# Patient Record
Sex: Female | Born: 1945
Health system: Southern US, Community
[De-identification: ages and names within clinical notes are randomized; demographics above are authoritative.]

## PROBLEM LIST (undated history)

## (undated) DIAGNOSIS — E119 Type 2 diabetes mellitus without complications: Secondary | ICD-10-CM

## (undated) DIAGNOSIS — I119 Hypertensive heart disease without heart failure: Secondary | ICD-10-CM

## (undated) DIAGNOSIS — F329 Major depressive disorder, single episode, unspecified: Secondary | ICD-10-CM

## (undated) DIAGNOSIS — K5900 Constipation, unspecified: Secondary | ICD-10-CM

## (undated) DIAGNOSIS — K5790 Diverticulosis of intestine, part unspecified, without perforation or abscess without bleeding: Secondary | ICD-10-CM

## (undated) DIAGNOSIS — E7800 Pure hypercholesterolemia, unspecified: Secondary | ICD-10-CM

## (undated) DIAGNOSIS — K219 Gastro-esophageal reflux disease without esophagitis: Secondary | ICD-10-CM

## (undated) DIAGNOSIS — J45909 Unspecified asthma, uncomplicated: Secondary | ICD-10-CM

## (undated) DIAGNOSIS — F419 Anxiety disorder, unspecified: Secondary | ICD-10-CM

## (undated) DIAGNOSIS — F32A Depression, unspecified: Secondary | ICD-10-CM

## (undated) DIAGNOSIS — R600 Localized edema: Secondary | ICD-10-CM

## (undated) DIAGNOSIS — Z87442 Personal history of urinary calculi: Secondary | ICD-10-CM

## (undated) DIAGNOSIS — E669 Obesity, unspecified: Secondary | ICD-10-CM

## (undated) DIAGNOSIS — M1990 Unspecified osteoarthritis, unspecified site: Secondary | ICD-10-CM

## (undated) DIAGNOSIS — I2510 Atherosclerotic heart disease of native coronary artery without angina pectoris: Secondary | ICD-10-CM

## (undated) DIAGNOSIS — J189 Pneumonia, unspecified organism: Secondary | ICD-10-CM

## (undated) DIAGNOSIS — E739 Lactose intolerance, unspecified: Secondary | ICD-10-CM

## (undated) DIAGNOSIS — R002 Palpitations: Secondary | ICD-10-CM

## (undated) DIAGNOSIS — D126 Benign neoplasm of colon, unspecified: Secondary | ICD-10-CM

## (undated) DIAGNOSIS — M549 Dorsalgia, unspecified: Secondary | ICD-10-CM

## (undated) DIAGNOSIS — M2550 Pain in unspecified joint: Secondary | ICD-10-CM

## (undated) DIAGNOSIS — T7840XA Allergy, unspecified, initial encounter: Secondary | ICD-10-CM

## (undated) DIAGNOSIS — D649 Anemia, unspecified: Secondary | ICD-10-CM

## (undated) DIAGNOSIS — N189 Chronic kidney disease, unspecified: Secondary | ICD-10-CM

## (undated) DIAGNOSIS — I493 Ventricular premature depolarization: Secondary | ICD-10-CM

## (undated) DIAGNOSIS — R7303 Prediabetes: Secondary | ICD-10-CM

## (undated) DIAGNOSIS — I10 Essential (primary) hypertension: Secondary | ICD-10-CM

## (undated) DIAGNOSIS — M719 Bursopathy, unspecified: Secondary | ICD-10-CM

## (undated) DIAGNOSIS — I509 Heart failure, unspecified: Secondary | ICD-10-CM

## (undated) DIAGNOSIS — E785 Hyperlipidemia, unspecified: Secondary | ICD-10-CM

## (undated) DIAGNOSIS — Z5189 Encounter for other specified aftercare: Secondary | ICD-10-CM

## (undated) DIAGNOSIS — Z973 Presence of spectacles and contact lenses: Secondary | ICD-10-CM

## (undated) DIAGNOSIS — Z972 Presence of dental prosthetic device (complete) (partial): Secondary | ICD-10-CM

## (undated) DIAGNOSIS — M810 Age-related osteoporosis without current pathological fracture: Secondary | ICD-10-CM

## (undated) DIAGNOSIS — E782 Mixed hyperlipidemia: Secondary | ICD-10-CM

## (undated) DIAGNOSIS — G4730 Sleep apnea, unspecified: Secondary | ICD-10-CM

## (undated) DIAGNOSIS — H269 Unspecified cataract: Secondary | ICD-10-CM

## (undated) DIAGNOSIS — K08109 Complete loss of teeth, unspecified cause, unspecified class: Secondary | ICD-10-CM

## (undated) DIAGNOSIS — E1169 Type 2 diabetes mellitus with other specified complication: Secondary | ICD-10-CM

## (undated) DIAGNOSIS — R079 Chest pain, unspecified: Secondary | ICD-10-CM

## (undated) DIAGNOSIS — D179 Benign lipomatous neoplasm, unspecified: Secondary | ICD-10-CM

## (undated) DIAGNOSIS — R011 Cardiac murmur, unspecified: Secondary | ICD-10-CM

## (undated) HISTORY — DX: Pain in unspecified joint: M25.50

## (undated) HISTORY — DX: Age-related osteoporosis without current pathological fracture: M81.0

## (undated) HISTORY — DX: Sleep apnea, unspecified: G47.30

## (undated) HISTORY — DX: Localized edema: R60.0

## (undated) HISTORY — DX: Prediabetes: R73.03

## (undated) HISTORY — DX: Dorsalgia, unspecified: M54.9

## (undated) HISTORY — DX: Hypertensive heart disease without heart failure: I11.9

## (undated) HISTORY — PX: COLONOSCOPY: SHX174

## (undated) HISTORY — DX: Unspecified cataract: H26.9

## (undated) HISTORY — PX: TUMOR EXCISION: SHX421

## (undated) HISTORY — PX: DILATION AND CURETTAGE OF UTERUS: SHX78

## (undated) HISTORY — DX: Gastro-esophageal reflux disease without esophagitis: K21.9

## (undated) HISTORY — DX: Major depressive disorder, single episode, unspecified: F32.9

## (undated) HISTORY — DX: Allergy, unspecified, initial encounter: T78.40XA

## (undated) HISTORY — DX: Encounter for other specified aftercare: Z51.89

## (undated) HISTORY — PX: HEMORROIDECTOMY: SUR656

## (undated) HISTORY — DX: Chronic kidney disease, unspecified: N18.9

## (undated) HISTORY — PX: POLYPECTOMY: SHX149

## (undated) HISTORY — DX: Benign neoplasm of colon, unspecified: D12.6

## (undated) HISTORY — DX: Depression, unspecified: F32.A

## (undated) HISTORY — DX: Type 2 diabetes mellitus with other specified complication: E78.2

## (undated) HISTORY — DX: Essential (primary) hypertension: I10

## (undated) HISTORY — DX: Heart failure, unspecified: I50.9

## (undated) HISTORY — PX: BREAST EXCISIONAL BIOPSY: SUR124

## (undated) HISTORY — DX: Unspecified asthma, uncomplicated: J45.909

## (undated) HISTORY — DX: Unspecified osteoarthritis, unspecified site: M19.90

## (undated) HISTORY — DX: Cardiac murmur, unspecified: R01.1

## (undated) HISTORY — DX: Anemia, unspecified: D64.9

## (undated) HISTORY — DX: Type 2 diabetes mellitus with other specified complication: E11.69

## (undated) HISTORY — DX: Type 2 diabetes mellitus without complications: E11.9

## (undated) HISTORY — DX: Obesity, unspecified: E66.9

## (undated) HISTORY — DX: Lactose intolerance, unspecified: E73.9

## (undated) HISTORY — DX: Chest pain, unspecified: R07.9

## (undated) HISTORY — DX: Diverticulosis of intestine, part unspecified, without perforation or abscess without bleeding: K57.90

## (undated) HISTORY — DX: Bursopathy, unspecified: M71.9

---

## 1997-10-28 ENCOUNTER — Ambulatory Visit (HOSPITAL_COMMUNITY): Admission: RE | Admit: 1997-10-28 | Discharge: 1997-10-28 | Payer: Self-pay | Admitting: Urology

## 1997-11-05 ENCOUNTER — Inpatient Hospital Stay (HOSPITAL_COMMUNITY): Admission: AD | Admit: 1997-11-05 | Discharge: 1997-11-06 | Payer: Self-pay | Admitting: Urology

## 1999-02-05 ENCOUNTER — Observation Stay (HOSPITAL_COMMUNITY): Admission: EM | Admit: 1999-02-05 | Discharge: 1999-02-06 | Payer: Self-pay | Admitting: Emergency Medicine

## 1999-02-05 ENCOUNTER — Encounter: Payer: Self-pay | Admitting: Emergency Medicine

## 2000-05-27 ENCOUNTER — Emergency Department (HOSPITAL_COMMUNITY): Admission: EM | Admit: 2000-05-27 | Discharge: 2000-05-27 | Payer: Self-pay | Admitting: Emergency Medicine

## 2002-08-21 ENCOUNTER — Other Ambulatory Visit: Admission: RE | Admit: 2002-08-21 | Discharge: 2002-08-21 | Payer: Self-pay | Admitting: Obstetrics and Gynecology

## 2002-09-04 ENCOUNTER — Encounter: Admission: RE | Admit: 2002-09-04 | Discharge: 2002-09-04 | Payer: Self-pay | Admitting: *Deleted

## 2002-10-01 ENCOUNTER — Encounter: Admission: RE | Admit: 2002-10-01 | Discharge: 2002-12-30 | Payer: Self-pay | Admitting: Family Medicine

## 2003-07-11 ENCOUNTER — Encounter: Admission: RE | Admit: 2003-07-11 | Discharge: 2003-07-11 | Payer: Self-pay | Admitting: General Surgery

## 2003-07-15 ENCOUNTER — Encounter: Admission: RE | Admit: 2003-07-15 | Discharge: 2003-07-15 | Payer: Self-pay | Admitting: General Surgery

## 2003-07-24 ENCOUNTER — Encounter: Admission: RE | Admit: 2003-07-24 | Discharge: 2003-07-24 | Payer: Self-pay | Admitting: Family Medicine

## 2004-02-17 ENCOUNTER — Other Ambulatory Visit: Admission: RE | Admit: 2004-02-17 | Discharge: 2004-02-17 | Payer: Self-pay | Admitting: Obstetrics and Gynecology

## 2006-02-18 ENCOUNTER — Ambulatory Visit (HOSPITAL_COMMUNITY): Admission: RE | Admit: 2006-02-18 | Discharge: 2006-02-18 | Payer: Self-pay | Admitting: Family Medicine

## 2006-02-21 ENCOUNTER — Other Ambulatory Visit: Admission: RE | Admit: 2006-02-21 | Discharge: 2006-02-21 | Payer: Self-pay | Admitting: Obstetrics and Gynecology

## 2006-08-06 ENCOUNTER — Emergency Department (HOSPITAL_COMMUNITY): Admission: EM | Admit: 2006-08-06 | Discharge: 2006-08-06 | Payer: Self-pay | Admitting: Emergency Medicine

## 2006-12-01 ENCOUNTER — Inpatient Hospital Stay (HOSPITAL_COMMUNITY): Admission: EM | Admit: 2006-12-01 | Discharge: 2006-12-02 | Payer: Self-pay | Admitting: Emergency Medicine

## 2007-02-15 DIAGNOSIS — I2510 Atherosclerotic heart disease of native coronary artery without angina pectoris: Secondary | ICD-10-CM | POA: Insufficient documentation

## 2007-02-15 HISTORY — DX: Atherosclerotic heart disease of native coronary artery without angina pectoris: I25.10

## 2007-02-22 ENCOUNTER — Other Ambulatory Visit: Admission: RE | Admit: 2007-02-22 | Discharge: 2007-02-22 | Payer: Self-pay | Admitting: Obstetrics and Gynecology

## 2007-03-24 ENCOUNTER — Encounter: Admission: RE | Admit: 2007-03-24 | Discharge: 2007-03-24 | Payer: Self-pay | Admitting: Obstetrics and Gynecology

## 2007-06-20 ENCOUNTER — Emergency Department (HOSPITAL_COMMUNITY): Admission: EM | Admit: 2007-06-20 | Discharge: 2007-06-20 | Payer: Self-pay | Admitting: Emergency Medicine

## 2007-09-11 ENCOUNTER — Emergency Department (HOSPITAL_COMMUNITY): Admission: EM | Admit: 2007-09-11 | Discharge: 2007-09-11 | Payer: Self-pay | Admitting: Emergency Medicine

## 2007-09-28 ENCOUNTER — Inpatient Hospital Stay (HOSPITAL_COMMUNITY): Admission: EM | Admit: 2007-09-28 | Discharge: 2007-09-30 | Payer: Self-pay | Admitting: Emergency Medicine

## 2008-06-25 IMAGING — CR DG CHEST 2V
2 series · 2 of 2 positions shown · non-contrast
Comparison: 08/06/06.

CLINICAL DATA: Chest pain.
 CHEST - 2 VIEW:

[w chest pa]
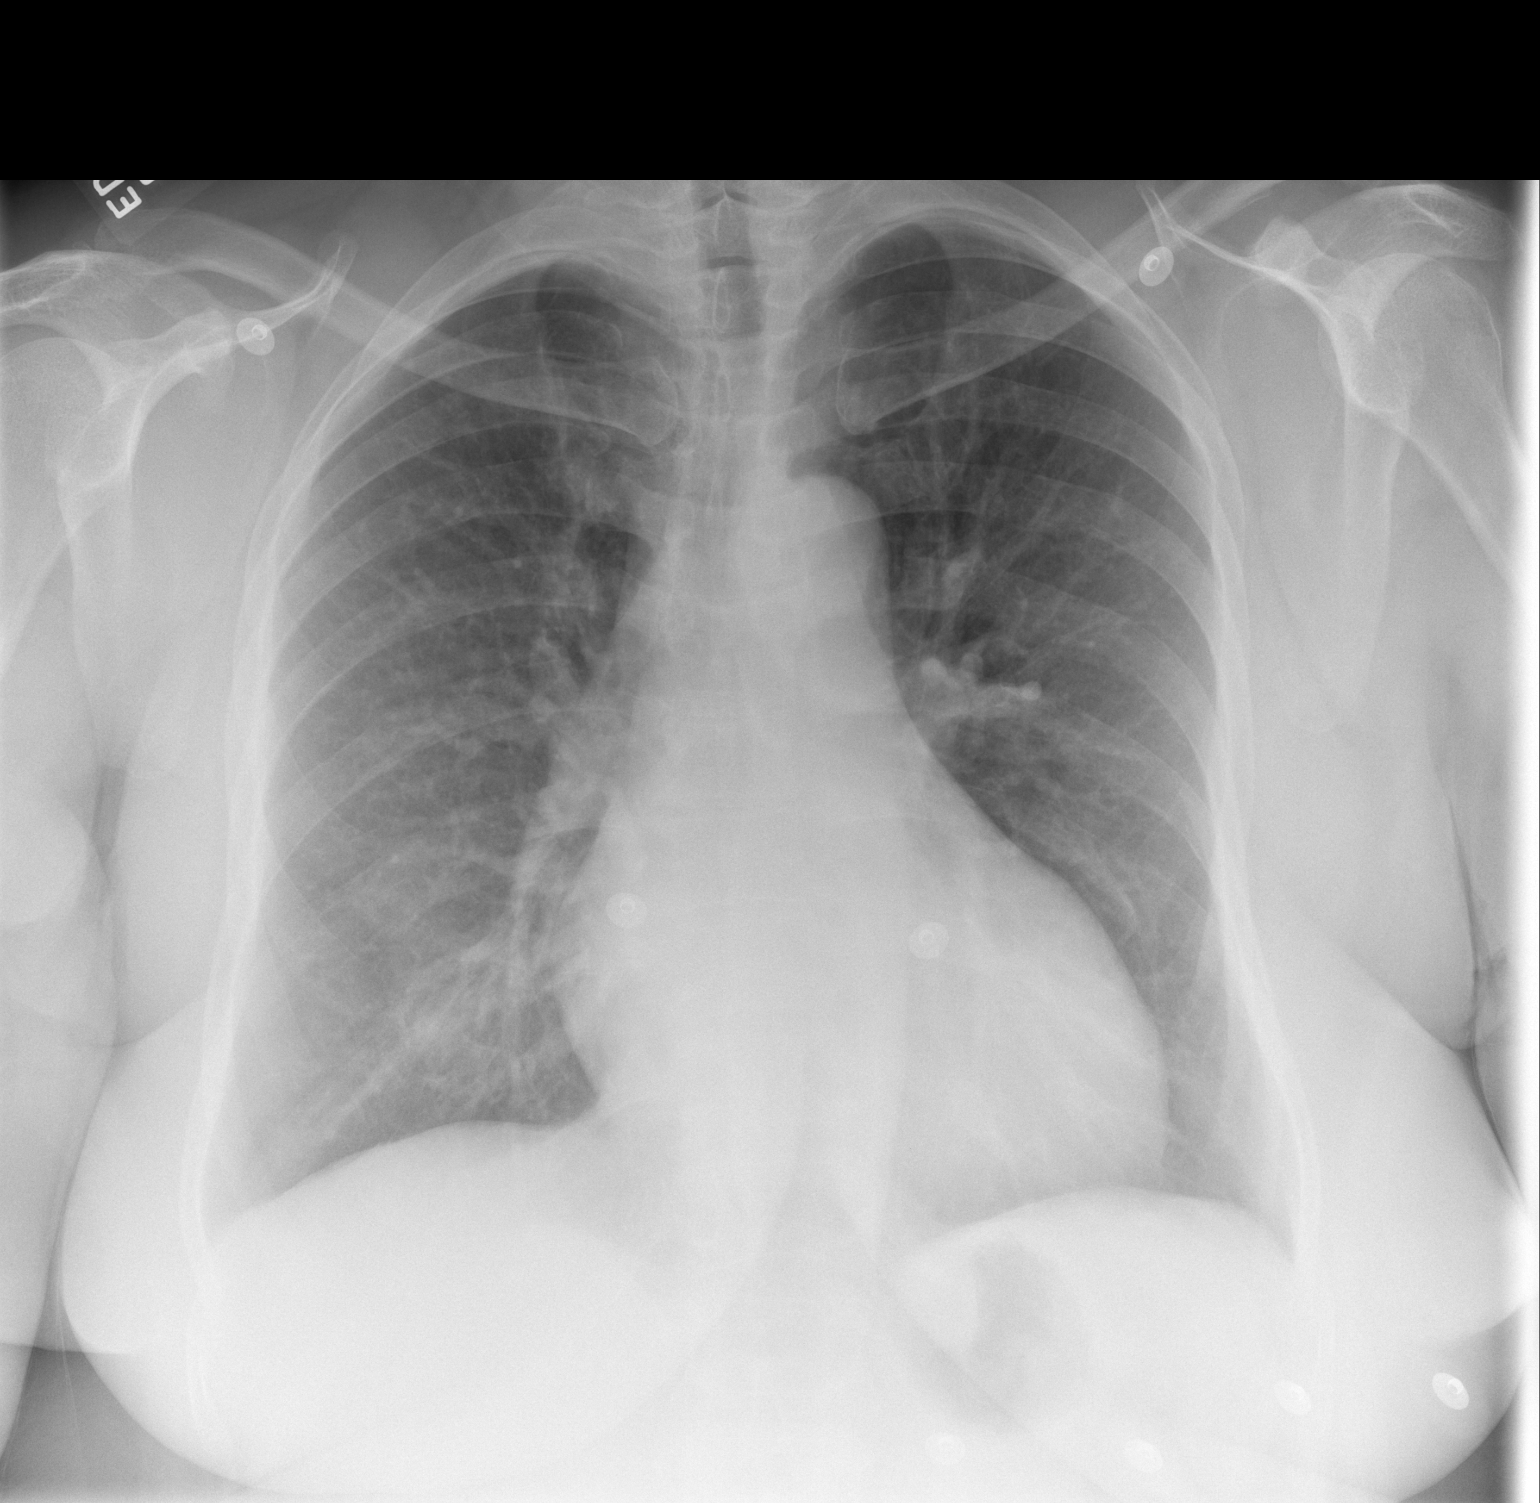

[w chest lat]
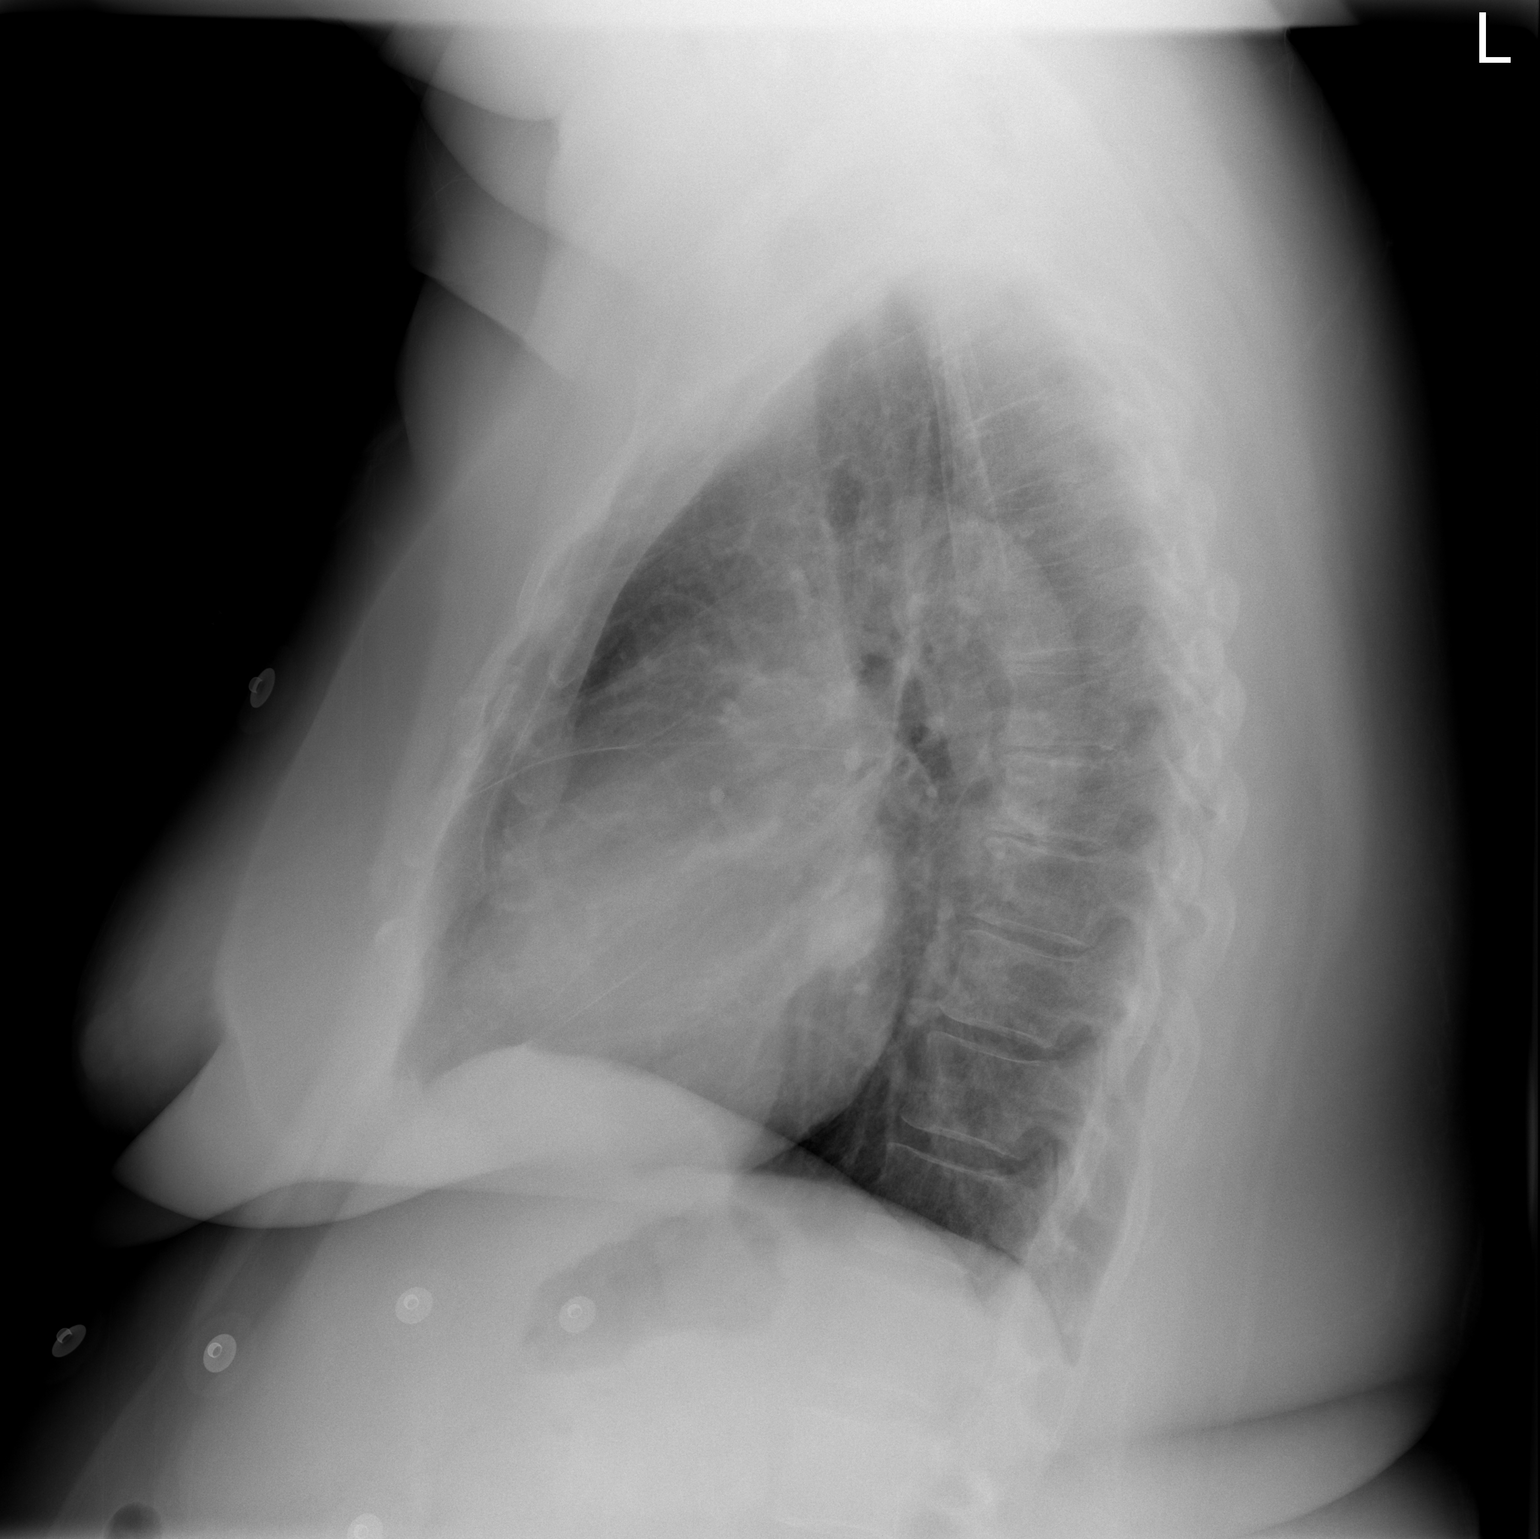

[2 of 2 positions shown; findings below may reference images not displayed]

FINDINGS: The heart is enlarged.  There is no congestive heart failure.  There are prominent lung markings and mild hyperaeration.  There is probable early COPD.  No acute process.
IMPRESSION: 1.  Cardiomegaly.
 2.  Chronic lung changes.
 3.  No active disease.

## 2008-11-13 ENCOUNTER — Emergency Department (HOSPITAL_COMMUNITY): Admission: EM | Admit: 2008-11-13 | Discharge: 2008-11-13 | Payer: Self-pay | Admitting: Emergency Medicine

## 2009-05-17 HISTORY — PX: CARDIAC CATHETERIZATION: SHX172

## 2009-08-07 ENCOUNTER — Ambulatory Visit: Payer: Self-pay | Admitting: Family Medicine

## 2009-09-18 ENCOUNTER — Ambulatory Visit: Payer: Self-pay | Admitting: Family Medicine

## 2009-11-14 ENCOUNTER — Ambulatory Visit: Payer: Self-pay | Admitting: Family Medicine

## 2010-01-21 ENCOUNTER — Ambulatory Visit: Payer: Self-pay | Admitting: Family Medicine

## 2010-02-13 ENCOUNTER — Ambulatory Visit: Payer: Self-pay | Admitting: Family Medicine

## 2010-02-19 ENCOUNTER — Encounter: Admission: RE | Admit: 2010-02-19 | Discharge: 2010-02-19 | Payer: Self-pay | Admitting: Family Medicine

## 2010-02-19 LAB — HM MAMMOGRAPHY: HM Mammogram: NEGATIVE

## 2010-04-13 ENCOUNTER — Emergency Department (HOSPITAL_COMMUNITY): Admission: EM | Admit: 2010-04-13 | Discharge: 2010-04-13 | Payer: Self-pay | Admitting: Emergency Medicine

## 2010-04-17 ENCOUNTER — Emergency Department (HOSPITAL_COMMUNITY)
Admission: EM | Admit: 2010-04-17 | Discharge: 2010-04-17 | Payer: Self-pay | Source: Home / Self Care | Admitting: Emergency Medicine

## 2010-05-12 ENCOUNTER — Ambulatory Visit (HOSPITAL_COMMUNITY)
Admission: RE | Admit: 2010-05-12 | Discharge: 2010-05-12 | Payer: Self-pay | Source: Home / Self Care | Attending: Obstetrics and Gynecology | Admitting: Obstetrics and Gynecology

## 2010-05-17 HISTORY — PX: REPLACEMENT TOTAL KNEE: SUR1224

## 2010-05-19 ENCOUNTER — Inpatient Hospital Stay (HOSPITAL_COMMUNITY)
Admission: RE | Admit: 2010-05-19 | Discharge: 2010-05-22 | Payer: Self-pay | Source: Home / Self Care | Attending: Orthopedic Surgery | Admitting: Orthopedic Surgery

## 2010-05-20 LAB — CBC
HCT: 32.4 % — ABNORMAL LOW (ref 36.0–46.0)
Hemoglobin: 10.3 g/dL — ABNORMAL LOW (ref 12.0–15.0)
MCH: 28.9 pg (ref 26.0–34.0)
MCHC: 31.8 g/dL (ref 30.0–36.0)
MCV: 91 fL (ref 78.0–100.0)
Platelets: 117 10*3/uL — ABNORMAL LOW (ref 150–400)
RBC: 3.56 MIL/uL — ABNORMAL LOW (ref 3.87–5.11)
RDW: 14.8 % (ref 11.5–15.5)
WBC: 9.9 10*3/uL (ref 4.0–10.5)

## 2010-05-20 LAB — GLUCOSE, CAPILLARY
Glucose-Capillary: 141 mg/dL — ABNORMAL HIGH (ref 70–99)
Glucose-Capillary: 151 mg/dL — ABNORMAL HIGH (ref 70–99)
Glucose-Capillary: 156 mg/dL — ABNORMAL HIGH (ref 70–99)
Glucose-Capillary: 165 mg/dL — ABNORMAL HIGH (ref 70–99)

## 2010-05-20 LAB — BASIC METABOLIC PANEL
BUN: 12 mg/dL (ref 6–23)
CO2: 25 mEq/L (ref 19–32)
Calcium: 8.5 mg/dL (ref 8.4–10.5)
Chloride: 103 mEq/L (ref 96–112)
Creatinine, Ser: 0.88 mg/dL (ref 0.4–1.2)
GFR calc Af Amer: >60 mL/min (ref >60)
GFR calc non Af Amer: >60 mL/min (ref >60)
Glucose, Bld: 153 mg/dL — ABNORMAL HIGH (ref 70–99)
Potassium: 4.2 mEq/L (ref 3.5–5.1)
Sodium: 136 mEq/L (ref 135–145)

## 2010-05-21 LAB — CBC
HCT: 29.6 % — ABNORMAL LOW (ref 36.0–46.0)
Hemoglobin: 9.5 g/dL — ABNORMAL LOW (ref 12.0–15.0)
MCH: 28.8 pg (ref 26.0–34.0)
MCHC: 32.1 g/dL (ref 30.0–36.0)
MCV: 89.7 fL (ref 78.0–100.0)
Platelets: 219 10*3/uL (ref 150–400)
RBC: 3.3 MIL/uL — ABNORMAL LOW (ref 3.87–5.11)
RDW: 14.8 % (ref 11.5–15.5)
WBC: 8.7 10*3/uL (ref 4.0–10.5)

## 2010-05-21 LAB — BASIC METABOLIC PANEL
BUN: 13 mg/dL (ref 6–23)
CO2: 25 mEq/L (ref 19–32)
Calcium: 8.5 mg/dL (ref 8.4–10.5)
Chloride: 104 mEq/L (ref 96–112)
Creatinine, Ser: 0.76 mg/dL (ref 0.4–1.2)
GFR calc Af Amer: >60 mL/min (ref >60)
GFR calc non Af Amer: >60 mL/min (ref >60)
Glucose, Bld: 144 mg/dL — ABNORMAL HIGH (ref 70–99)
Potassium: 4.3 mEq/L (ref 3.5–5.1)
Sodium: 134 mEq/L — ABNORMAL LOW (ref 135–145)

## 2010-05-21 LAB — GLUCOSE, CAPILLARY
Glucose-Capillary: 132 mg/dL — ABNORMAL HIGH (ref 70–99)
Glucose-Capillary: 124 mg/dL — ABNORMAL HIGH (ref 70–99)
Glucose-Capillary: 151 mg/dL — ABNORMAL HIGH (ref 70–99)
Glucose-Capillary: 146 mg/dL — ABNORMAL HIGH (ref 70–99)

## 2010-05-22 LAB — GLUCOSE, CAPILLARY
Glucose-Capillary: 127 mg/dL — ABNORMAL HIGH (ref 70–99)
Glucose-Capillary: 174 mg/dL — ABNORMAL HIGH (ref 70–99)

## 2010-07-27 LAB — BASIC METABOLIC PANEL
BUN: 11 mg/dL (ref 6–23)
GFR calc Af Amer: >60 mL/min (ref >60)
GFR calc non Af Amer: >60 mL/min (ref >60)

## 2010-07-27 LAB — URINALYSIS, ROUTINE W REFLEX MICROSCOPIC
Bilirubin Urine: NEGATIVE
Ketones, ur: NEGATIVE mg/dL
Protein, ur: NEGATIVE mg/dL
Urobilinogen, UA: 1 mg/dL (ref 0.0–1.0)

## 2010-07-27 LAB — CBC
MCH: 29 pg (ref 26.0–34.0)
MCHC: 32.8 g/dL (ref 30.0–36.0)
Platelets: 248 10*3/uL (ref 150–400)

## 2010-07-27 LAB — ABO/RH: ABO/RH(D): A POS

## 2010-07-27 LAB — TYPE AND SCREEN
ABO/RH(D): A POS
Antibody Screen: NEGATIVE

## 2010-07-27 LAB — GLUCOSE, CAPILLARY
Glucose-Capillary: 137 mg/dL — ABNORMAL HIGH (ref 70–99)
Glucose-Capillary: 180 mg/dL — ABNORMAL HIGH (ref 70–99)
Glucose-Capillary: 96 mg/dL (ref 70–99)

## 2010-07-27 LAB — SURGICAL PCR SCREEN: Staphylococcus aureus: NEGATIVE

## 2010-07-27 LAB — PROTIME-INR
INR: 1.16 (ref 0.00–1.49)
Prothrombin Time: 15 seconds (ref 11.6–15.2)

## 2010-07-28 LAB — DIFFERENTIAL
Basophils Absolute: 0 10*3/uL (ref 0.0–0.1)
Basophils Absolute: 0 10*3/uL (ref 0.0–0.1)
Basophils Relative: 0 % (ref 0–1)
Eosinophils Absolute: 0.1 10*3/uL (ref 0.0–0.7)
Eosinophils Relative: 1 % (ref 0–5)
Lymphocytes Relative: 25 % (ref 12–46)
Lymphocytes Relative: 26 % (ref 12–46)
Lymphs Abs: 2.2 10*3/uL (ref 0.7–4.0)
Lymphs Abs: 2.4 10*3/uL (ref 0.7–4.0)
Monocytes Absolute: 0.7 10*3/uL (ref 0.1–1.0)
Monocytes Absolute: 0.9 10*3/uL (ref 0.1–1.0)
Monocytes Relative: 9 % (ref 3–12)
Neutro Abs: 5.3 10*3/uL (ref 1.7–7.7)
Neutro Abs: 6 10*3/uL (ref 1.7–7.7)
Neutrophils Relative %: 64 % (ref 43–77)

## 2010-07-28 LAB — BASIC METABOLIC PANEL
BUN: 12 mg/dL (ref 6–23)
BUN: 17 mg/dL (ref 6–23)
CO2: 26 mEq/L (ref 19–32)
Calcium: 9.1 mg/dL (ref 8.4–10.5)
Creatinine, Ser: 0.9 mg/dL (ref 0.4–1.2)
GFR calc Af Amer: >60 mL/min (ref >60)
GFR calc non Af Amer: >60 mL/min (ref >60)
Glucose, Bld: 118 mg/dL — ABNORMAL HIGH (ref 70–99)
Potassium: 3.4 mEq/L — ABNORMAL LOW (ref 3.5–5.1)

## 2010-07-28 LAB — TYPE AND SCREEN
ABO/RH(D): A POS
DAT, IgG: NEGATIVE

## 2010-07-28 LAB — CBC
HCT: 33.4 % — ABNORMAL LOW (ref 36.0–46.0)
MCH: 29.2 pg (ref 26.0–34.0)
MCHC: 33.3 g/dL (ref 30.0–36.0)
Platelets: 215 10*3/uL (ref 150–400)
Platelets: 218 10*3/uL (ref 150–400)
RBC: 3.8 MIL/uL — ABNORMAL LOW (ref 3.87–5.11)
RDW: 15.7 % — ABNORMAL HIGH (ref 11.5–15.5)
RDW: 15.8 % — ABNORMAL HIGH (ref 11.5–15.5)
WBC: 9.4 10*3/uL (ref 4.0–10.5)

## 2010-08-24 LAB — DIFFERENTIAL
Basophils Relative: 0 % (ref 0–1)
Eosinophils Relative: 4 % (ref 0–5)
Lymphocytes Relative: 37 % (ref 12–46)
Neutrophils Relative %: 51 % (ref 43–77)

## 2010-08-24 LAB — POCT CARDIAC MARKERS
CKMB, poc: <1 ng/mL — ABNORMAL LOW (ref 1.0–8.0)
Myoglobin, poc: 68.4 ng/mL (ref 12–200)
Troponin i, poc: <0.05 ng/mL (ref 0.00–0.09)

## 2010-08-24 LAB — BASIC METABOLIC PANEL
Calcium: 9 mg/dL (ref 8.4–10.5)
Chloride: 107 mEq/L (ref 96–112)
Creatinine, Ser: 0.59 mg/dL (ref 0.4–1.2)
GFR calc Af Amer: >60 mL/min (ref >60)
Sodium: 139 mEq/L (ref 135–145)

## 2010-08-24 LAB — CBC
Hemoglobin: 11.6 g/dL — ABNORMAL LOW (ref 12.0–15.0)
RBC: 4 MIL/uL (ref 3.87–5.11)
WBC: 8.9 10*3/uL (ref 4.0–10.5)

## 2010-09-29 NOTE — H&P (Signed)
Sandra Lawrence, Sandra Lawrence                ACCOUNT NO.:  [id number]   MEDICAL RECORD NO.:  [id number]          PATIENT TYPE:  INP   LOCATION:  3735                         FACILITY:  [facility]   PHYSICIAN:  Lyn Records, M.D.   DATE OF BIRTH:  1945-11-03   DATE OF ADMISSION:  09/28/2007  DATE OF DISCHARGE:                              HISTORY & PHYSICAL   CHIEF COMPLAINT:  Chest pain.   HISTORY OF PRESENT ILLNESS:  Sandra Lawrence is a 65 year old female with a  known history of nonobstructive coronary artery disease, who was  admitted last year for chest pain.  Catheterization at that time  revealed normal left main, LAD with luminal irregularities as well as  the RCA.  The circumflex had a 50% stenosis that was not thought to be  truly obstructive in nature.  She was sent home on a medical regimen and  had been doing well until this morning.   This morning, around 5 a.m., she awoke with some substernal chest pain.  She took 2 sublingual nitroglycerin with relief of her pain.  On the way  to work, her pain came back and then she proceeded to the emergency  room.  She has had some dizziness, but denies any palpitations,  shortness of breath, nausea, or vomiting.  Her EKG in the emergency room  is normal.  She recently had an asthmatic episode, and she says she was  off work for 1 week.  She states that her asthma is now under control.  She is supposed to be on beclomethasone, but has not yet gotten this  filled secondary to monetary issues.   REVIEW OF SYSTEMS:  Deferred as she is under a lot of stress, but is  otherwise negative.   PAST MEDICAL HISTORY:  1. Nonobstructive coronary artery disease.  2. LVH.  3. Asthma.  4. Hypertension.  5. Hyperlipidemia.  6. She is diabetic and has been on medications in the past, but now      she is off them, but states that she needs to get back on them.   SOCIAL HISTORY:  No tobacco, alcohol, or illicit drug use.   FAMILY HISTORY:  Mother with  diabetes.   ALLERGIES:  No known drug allergies.   MEDICATIONS:  1. Lisinopril 40 mg a day.  2. Zocor 40 mg a day.  3. Albuterol p.r.n.  4. Enteric-coated aspirin 325 mg a day.  5. Hydrochlorothiazide 12.5 mg daily.  6. She is supposed to be on Norvasc 10 mg a day, but has run out in      the past 2 weeks, and she did not have it refilled secondary to      cost.  7. She is supposed to be on beclomethasone and has yet to get this      filled as well.   PHYSICAL EXAMINATION:  VITAL SIGNS:  Temperature 98.2, blood pressure  131/65, pulse 59, respirations 20, and O2 saturation is 98% on room air.  HEENT:  Grossly normal.  No carotid or subclavian bruits.  No JVD or  thyromegaly.  Sclerae clear.  Conjunctivae normal.  Nares without  drainage.  CHEST:  Clear to auscultation bilaterally.  No wheezing.  HEART:  Regular rate and rhythm.  No gross murmur, and no rub.  ABDOMEN:  Soft, nontender, and nondistended.  No mass.  No bruits.  EXTREMITIES:  No peripheral edema.  SKIN:  Warm and dry.   LAB STUDIES:  Point-of-care markers negative x3.  Pro-time 13.5,  INR  1.0, sodium 137, potassium 3.8, BUN 11, and creatinine 0.9, hemoglobin  12.6, and hematocrit 37.0.   EKG, normal sinus rhythm, rate 70 with no acute ST-T wave changes.  Chest x-ray, no acute findings.   ASSESSMENT AND PLAN:  1. Chest pain.  2. Nonobstructive coronary artery disease.  3. Hypertension.  4. Hyperlipidemia.  5. Asthma with a recent exacerbation.  6. Diabetes mellitus.   We will admit, cycle her enzymes, restart her on Norvasc.  I will not  start her beta-blocker secondary to a recent exacerbation of asthma.  Therefore, she will ultimately need Cardiolite.  We will monitor her  overnight.  The patient was seen and examined by Dr. Verdis Lawrence.      Guy Franco, P.A.      Lyn Records, M.D.  Electronically Signed    LB/MEDQ  D:  09/28/2007  T:  09/29/2007  Job:  [id number]

## 2010-09-29 NOTE — H&P (Signed)
NAMEAMBRA, HAVERSTICK                ACCOUNT NO.:  [id number]   MEDICAL RECORD NO.:  [id number]          PATIENT TYPE:  INP   LOCATION:  3701                         FACILITY:  [facility]   PHYSICIAN:  Lyn Records, M.D.   DATE OF BIRTH:  08-06-1945   DATE OF ADMISSION:  12/01/2006  DATE OF DISCHARGE:  12/02/2006                              HISTORY & PHYSICAL   REASON FOR HOSPITALIZATION:  Chest pain.   HISTORY:  Sandra Lawrence is a 65 year old female with no known history of  a coronary artery disease who complains of progressive chest pain with  exertion since October of 2007.  She awoke around 3 a.m. on the date of  admission with chest achiness, intermittent during the morning, but  now pain free in the emergency room for approximately 30 minutes.  She  just had some dyspnea, but denies any PND, orthopnea, dizziness or  syncope.  She does complain of occasional palpitations.  She also  describes rare chest pain at rest that she describes differently as  being sharp, but unlike the other chest pains she has had in the past.   REVIEW OF SYSTEMS:  Are, otherwise, normal.   ALLERGIES:  NO KNOWN DRUG ALLERGIES.   MEDICATIONS:  1. Lisinopril 40 mg a day.  2. Zocor 40 mg a day.  3. Albuterol p.r.n.  4. Beclomethasone 2 puffs b.i.d.  5. Cinnamon for diabetes.   SOCIAL HISTORY:  No tobacco, alcohol or illicit drug use.  She is  adopted.   FAMILY HISTORY:  She does know her biological parental history.  Her mom  had diabetes.  Her dad had kidney cancer.   PAST MEDICAL HISTORY:  1. Diabetes mellitus.  2. Hypercholesterolemia.  3. Hypertension.  4. Asthma.  5. Long term medication use.   PHYSICAL EXAMINATION:  VITAL SIGNS:  Blood pressure 165/61.  Pulse 65.  Respirations 22.  HEENT:  Grossly normal.  No carotid or subclavian bruits.  No JVD or  thyromegaly.  Sclerae are clear.  Conjunctivae normal.  Nares without  drainage.  CHEST:  Clear to auscultation bilaterally.  No wheezing  or rhconhi.  HEART:  Regular rate and rhythm.  No murmur.  ABDOMEN:  Soft.  Positive bowel sounds.  Nontender, nondistended.  No  masses.  No bruits.  LOWER EXTREMITIES:  No peripheral edema.  SKIN:  Warm and dry.   LABORATORY DATA:  Sodium 138, potassium 3.9, BUN 12, creatinine 0.7,  hemoglobin 13.9, hematocrit 41.0.  Point of care markers negative x2.  Chest x-ray shows cardiomegaly.  EKG:  Normal sinus rhythm with T wave  inversion in V6; otherwise, normal.   ASSESSMENT/PLAN:  1. Chest pain.  2. Dyspnea.  3. Diabetes mellitus.  4. Hyperlipidemia.  5. Hypertension.  6. Obesity.  7. Asthma.   We admit.  Plan to cycle cardiac isoenzymes and plan for cardiac  catheterization tomorrow morning.      Guy Franco, P.A.      Lyn Records, M.D.  Electronically Signed    LB/MEDQ  D:  12/02/2006  T:  12/03/2006  Job:  [id number]

## 2010-09-29 NOTE — Discharge Summary (Signed)
Sandra Lawrence, Sandra Lawrence                ACCOUNT NO.:  [id number]   MEDICAL RECORD NO.:  [id number]          PATIENT TYPE:  INP   LOCATION:  3701                         FACILITY:  [facility]   PHYSICIAN:  Lyn Records, M.D.   DATE OF BIRTH:  08/15/1945   DATE OF ADMISSION:  12/01/2006  DATE OF DISCHARGE:  12/02/2006                               DISCHARGE SUMMARY   DISCHARGE DIAGNOSES:  1. Chest pain, resolved.  2. Hypertensive heart disease.  3. Nonobstructive coronary artery disease.  4. Left ventricular hypertrophy.  5. Hypertension.  6. Hyperlipidemia.  7. Diabetes mellitus.  8. Asthma.  9. Long term medication use.   HISTORY:  [name] is a 65 year old female with known history of  coronary artery disease who has complained of progressive exertional  chest pain since October of 2007.  She was awakened around 3 a.m. on the  date of admission with chest achiness that was intermittent during the  morning hours.  She did present to the emergency room and since coming  here, she had been in pain for 30 minutes prior to my arrival.   Ultimately, she underwent cardiac catheterization and on December 02, 2006,  she was found to have the following:  EF of 70%, normal systolic  function, left main normal, LAD 30% lesion, circumflex 3 OMs, each with  about a 50% proximal obstruction, RCA was normal and dominant.  The  patient was Angio-Seal.   The patient was noted to have LVH with an elevated LV EDP.  EF 70%.  At  this time, we will treat the patient aggressively with risk factor  modification, including statins, antihypertensive therapy, weight loss,  as well as diabetes control.  The patient is discharged to home in  stable condition.   LABORATORY DATA:  Sodium 138, potassium 3.9, BUN 12, creatinine 0.61.  LFTs essentially normal.  Cardiac isoenzymes negative.  Total  cholesterol 138, triglycerides 62, LDL 85, HDL 41.  White count 5.5,  hemoglobin 12.2, hematocrit 37.2, platelets  257.   DISCHARGE MEDICATIONS:  1. Enteric-coated aspirin 325 mg a day.  2. Lisinopril 40 mg a day.  3. Zocor 40 mg a day.  4. Albuterol p.r.n.  5. Beclomethasone as prior to admission.  6. She takes cinnamon for diabetes.  7. Her new medication is hydrochlorothiazide 12.5 mg 1 tablet daily.   Hopefully, as an outpatient we can gently add a beta-blocker regimen  into her care, but we will need to be mindful that she is an asthmatic.   She is to remain on a diabetic diet.  No lifting over 10 pounds for 1  week.  No driving for 2 days.  She may shower and bath.  Increase  activity slowly.  Follow up with Dr. Katrinka Blazing on December 16, 2006 at 9:45  a.m.      Guy Franco, P.A.      Lyn Records, M.D.  Electronically Signed    LB/MEDQ  D:  12/02/2006  T:  12/03/2006  Job:  [id number]   cc:   Lorelle Formosa, M.D.

## 2010-09-29 NOTE — Cardiovascular Report (Signed)
Sandra Lawrence, Sandra Lawrence                ACCOUNT NO.:  [id number]   MEDICAL RECORD NO.:  [id number]          PATIENT TYPE:  INP   LOCATION:  3701                         FACILITY:  [facility]   PHYSICIAN:  Lyn Records, M.D.   DATE OF BIRTH:  12/15/1945   DATE OF PROCEDURE:  12/02/2006  DATE OF DISCHARGE:  12/02/2006                            CARDIAC CATHETERIZATION   INDICATIONS:  The patient is 66 years of age and has diabetes.  She is  markedly obese, hypertensive and has hypercholesterolemia.  She  presented to the emergency room with an acute episode of chest pain that  awakened her from sleep.  The chest pain was atypical in nature.  Upon  questioning, however, she had a 6-8 month history of exertional chest  tightness and dyspnea that was compatible with angina pectoris.  She is  rule out for myocardial infarction.  It is felt that coronary  angiography is indicated to define anatomy and help guide therapy in  this patient with multiple risk factors including diabetes.   PROCEDURE:  1. Left heart cath.  2. Selective coronary angio.  3. Left ventriculography.  4. AngioSeal arteriotomy closure.   DESCRIPTION:  After informed consent, 6-French sheath was placed in the  right femoral artery using modified Seldinger technique.  A 6-French A2  multipurpose catheter was then used for hemodynamic recordings, left  ventriculography by hand injection, and selective left and right  coronary angiography.   Angio-Seal was used successfully for closure without complications and  good hemostasis achieved.   RESULTS:  1. Hemodynamic data:.  Aortic pressure 179/94.  The left ventricular      pressure was 179/19.  2. Left ventriculography:  Left ventricle demonstrates relatively      small LV cavity.  Hypertrophied papillary muscles and LV anterior      free wall.  Contractility is normal.  EF is greater than 65-70%.      No regional abnormalities are noted.  No mitral regurgitation is  seen.  3. Coronary angiography.      a.     Left main coronary:  Widely patent.      b.     Left anterior descending coronary:  LAD is widely patent,       smooth.  There may be mild narrowing in the midvessel of the LAD       in the region with the diagonal arises.  No high-grade obstruction       is felt to be present and any narrowing in this region is felt to       be less than or equal to 30%.      c.     Circumflex artery:  Circumflex is moderate in size.  It       gives origin to three moderate-sized obtuse marginals.  The obtuse       marginals each contained luminal irregularities with up to 50%       narrowing in the proximal segment and nearest os from the body of       the circumflex.  No high-grade disease  is noted.Marland Kitchen      d.     Right coronary:  This a dominant vessel, large left       ventricular branch, large PDA.  Tortuosity is noted in the vessel       on the inferior wall.  Luminal irregularities noted in the body of       the right coronary.  No significant obstructions is seen.   CONCLUSION:  1. Mild to moderate obtuse marginal disease involving all three      branches as noted above with 50-60% narrowing in either the ostial      or proximal segments of each, but no high-grade obstruction is      seen.  Luminal irregularities noted in the mid-LAD and in the right      coronary.  2. Left ventricular hypertrophy with normal systolic function.  There      is elevated left ventricular end-diastolic pressure consistent with      hypertensive heart disease.  3. Exertional chest discomfort is probably secondary to hypertensive      heart disease with ischemia caused by left ventricular hypertrophy      and demand greater than supply.  Cannot rule out some component of      endothelial dysfunction, especially in the circumflex territory      that could be contributing.   PLAN:  1. Home later today.  2. Aggressive risk factor modification to include statin therapy to       get goal LDL less than 70 which should also be our target because      she is diabetic.  We need to control her blood pressures to      systolics less than 130, diastolics need to be less than or equal      to 80.  She is already on ACE inhibitor, and we need to add low-      dose diuretic therapy.  It will be difficult add beta blocker      therapy because she is asthmatic, but low-dose calcium channel      blocker therapy that has negative anatrophic defects would be      helpful.  I will encourage weight loss.  She needs to follow-up      with Dr. Ronne Binning on a very regular basis to ensure aggressive      diabetes control.  3. Will plan discharged home later today if no bleeding from the groin      or other complications are developed in the recovery phase.      Lyn Records, M.D.  Electronically Signed     HWS/MEDQ  D:  12/02/2006  T:  12/02/2006  Job:  [id number]   cc:   Lorelle Formosa, M.D.

## 2010-10-01 ENCOUNTER — Emergency Department (HOSPITAL_COMMUNITY)
Admission: EM | Admit: 2010-10-01 | Discharge: 2010-10-01 | Disposition: A | Discharge status: Home / Self Care | Payer: BC Managed Care – PPO | Attending: Emergency Medicine | Admitting: Emergency Medicine

## 2010-10-01 DIAGNOSIS — E119 Type 2 diabetes mellitus without complications: Secondary | ICD-10-CM | POA: Insufficient documentation

## 2010-10-01 DIAGNOSIS — F411 Generalized anxiety disorder: Secondary | ICD-10-CM | POA: Insufficient documentation

## 2010-10-01 DIAGNOSIS — I10 Essential (primary) hypertension: Secondary | ICD-10-CM | POA: Insufficient documentation

## 2010-11-10 ENCOUNTER — Other Ambulatory Visit: Payer: Self-pay | Admitting: Family Medicine

## 2010-11-10 ENCOUNTER — Encounter: Payer: Self-pay | Admitting: Family Medicine

## 2010-11-10 NOTE — Telephone Encounter (Signed)
Please ask patient to call for appointment for any further refills.

## 2010-11-19 ENCOUNTER — Telehealth: Payer: Self-pay | Admitting: Family Medicine

## 2010-11-19 MED ORDER — PRAVASTATIN SODIUM 40 MG PO TABS: 40.0000 mg | ORAL_TABLET | Freq: Every day | ORAL | Status: DC

## 2010-11-19 NOTE — Telephone Encounter (Signed)
Med done

## 2011-02-01 ENCOUNTER — Emergency Department (HOSPITAL_COMMUNITY)
Admission: EM | Admit: 2011-02-01 | Discharge: 2011-02-01 | Disposition: A | Discharge status: Home / Self Care | Payer: BC Managed Care – PPO | Attending: Emergency Medicine | Admitting: Emergency Medicine

## 2011-02-01 DIAGNOSIS — E119 Type 2 diabetes mellitus without complications: Secondary | ICD-10-CM | POA: Insufficient documentation

## 2011-02-01 DIAGNOSIS — E7800 Pure hypercholesterolemia, unspecified: Secondary | ICD-10-CM | POA: Insufficient documentation

## 2011-02-01 DIAGNOSIS — J45909 Unspecified asthma, uncomplicated: Secondary | ICD-10-CM | POA: Insufficient documentation

## 2011-02-01 DIAGNOSIS — F411 Generalized anxiety disorder: Secondary | ICD-10-CM | POA: Insufficient documentation

## 2011-02-01 DIAGNOSIS — I10 Essential (primary) hypertension: Secondary | ICD-10-CM | POA: Insufficient documentation

## 2011-02-10 LAB — CBC
Hemoglobin: 11.6 — ABNORMAL LOW
RBC: 4.12
RDW: 15.6 — ABNORMAL HIGH
WBC: 5.6

## 2011-02-10 LAB — CARDIAC PANEL(CRET KIN+CKTOT+MB+TROPI)
CK, MB: 1.2
Total CK: 60

## 2011-03-01 LAB — I-STAT 8, (EC8 V) (CONVERTED LAB)
Acid-Base Excess: 1
Potassium: 3.9
TCO2: 27
pCO2, Ven: 39.9 — ABNORMAL LOW
pH, Ven: 7.414 — ABNORMAL HIGH

## 2011-03-01 LAB — CBC
HCT: 37.2
Hemoglobin: 11.6 — ABNORMAL LOW
Hemoglobin: 12.2
MCHC: 32.7
MCV: 85.2
RBC: 4.37
RDW: 15.5 — ABNORMAL HIGH

## 2011-03-01 LAB — CK TOTAL AND CKMB (NOT AT ARMC)
CK, MB: 1.1
CK, MB: 1.3
Relative Index: INVALID

## 2011-03-01 LAB — TSH: TSH: 0.939

## 2011-03-01 LAB — CREATININE, SERUM
Creatinine, Ser: 0.61
GFR calc non Af Amer: >60

## 2011-03-01 LAB — POCT CARDIAC MARKERS
Myoglobin, poc: 46.5
Troponin i, poc: <0.05

## 2011-03-01 LAB — PROTIME-INR
INR: 1
Prothrombin Time: 13.4

## 2011-03-01 LAB — TROPONIN I
Troponin I: 0.01
Troponin I: 0.01
Troponin I: 0.01

## 2011-03-01 LAB — POCT I-STAT CREATININE
Creatinine, Ser: 0.7
Operator id: 198171

## 2011-03-01 LAB — HEPATIC FUNCTION PANEL: Total Protein: 6.7

## 2011-03-08 ENCOUNTER — Other Ambulatory Visit: Payer: Self-pay | Admitting: Family Medicine

## 2011-03-17 ENCOUNTER — Telehealth: Payer: Self-pay | Admitting: Internal Medicine

## 2011-03-17 ENCOUNTER — Encounter: Payer: Self-pay | Admitting: Family Medicine

## 2011-03-17 ENCOUNTER — Ambulatory Visit (INDEPENDENT_AMBULATORY_CARE_PROVIDER_SITE_OTHER): Payer: BC Managed Care – PPO | Admitting: Family Medicine

## 2011-03-17 VITALS — BP 130/84 | HR 76 | Wt 249.0 lb

## 2011-03-17 DIAGNOSIS — R7303 Prediabetes: Secondary | ICD-10-CM | POA: Insufficient documentation

## 2011-03-17 DIAGNOSIS — M129 Arthropathy, unspecified: Secondary | ICD-10-CM

## 2011-03-17 DIAGNOSIS — F419 Anxiety disorder, unspecified: Secondary | ICD-10-CM

## 2011-03-17 DIAGNOSIS — F411 Generalized anxiety disorder: Secondary | ICD-10-CM

## 2011-03-17 DIAGNOSIS — Z0000 Encounter for general adult medical examination without abnormal findings: Secondary | ICD-10-CM

## 2011-03-17 DIAGNOSIS — R7309 Other abnormal glucose: Secondary | ICD-10-CM

## 2011-03-17 DIAGNOSIS — Z2911 Encounter for prophylactic immunotherapy for respiratory syncytial virus (RSV): Secondary | ICD-10-CM

## 2011-03-17 DIAGNOSIS — Z23 Encounter for immunization: Secondary | ICD-10-CM

## 2011-03-17 DIAGNOSIS — E669 Obesity, unspecified: Secondary | ICD-10-CM

## 2011-03-17 DIAGNOSIS — M1990 Unspecified osteoarthritis, unspecified site: Secondary | ICD-10-CM

## 2011-03-17 DIAGNOSIS — I10 Essential (primary) hypertension: Secondary | ICD-10-CM

## 2011-03-17 HISTORY — DX: Morbid (severe) obesity due to excess calories: E66.01

## 2011-03-17 HISTORY — DX: Unspecified osteoarthritis, unspecified site: M19.90

## 2011-03-17 MED ORDER — ORLISTAT 120 MG PO CAPS: 120.0000 mg | ORAL_CAPSULE | Freq: Three times a day (TID) | ORAL | Status: DC

## 2011-03-17 NOTE — Telephone Encounter (Signed)
I called to set up an appt for colonoscopy for pt, but i was not able to set that up until she gives them a call.

## 2011-03-17 NOTE — Progress Notes (Signed)
Subjective:    Patient ID: Sandra Lawrence, female    DOB: 30-Mar-1946, 65 y.o.   MRN: [id number]  HPI She is here for a complete examination. She did have left knee TKR and is doing quite nicely with this. She plans to have her right knee done within the next several months. She continues on her blood pressure medication and is having no difficulty with this. She would like to be placed back on Xenical which did help her with weight reduction in the past. Review of her record indicates she has lost 20 pounds since last year. She has a previous history of difficulty with her blood sugars and apparently at one time was placed on metformin but had GI side effects from this. She then switched to Sinemet which she says brought her blood sugar down. She no longer is testing. She is having difficulty with anxiety and sleep disturbance based around her daughter and granddaughter and the problems they are having with the father and with legal issues.  Review of Systems  Constitutional: Negative.   HENT: Negative.   Eyes: Negative.   Respiratory: Negative.   Cardiovascular: Negative.   Gastrointestinal: Negative.   Genitourinary: Negative.   Musculoskeletal: Negative.   Skin: Negative.   Neurological: Negative.   Hematological: Negative.   Psychiatric/Behavioral: Negative.        Objective:   Physical Exam BP 130/84  Pulse 76  Wt 249 lb (112.946 kg)  General Appearance:    Alert, cooperative, no distress, appears stated age  Head:    Normocephalic, without obvious abnormality, atraumatic  Eyes:    PERRL, conjunctiva/corneas clear, EOM's intact, fundi    benign  Ears:    Normal TM's and external ear canals  Nose:   Nares normal, mucosa normal, no drainage or sinus   tenderness  Throat:   Lips, mucosa, and tongue normal; teeth and gums normal  Neck:   Supple, no lymphadenopathy;  thyroid:  no   enlargement/tenderness/nodules; no carotid   bruit or JVD  Back:    Spine nontender, no  curvature, ROM normal, no CVA     tenderness  Lungs:     Clear to auscultation bilaterally without wheezes, rales or     ronchi; respirations unlabored  Chest Wall:    No tenderness or deformity   Heart:    Regular rate and rhythm, S1 and S2 normal, no murmur, rub   or gallop  Breast Exam:    Deferred to GYN  Abdomen:     Soft, non-tender, nondistended, normoactive bowel sounds,    no masses, no hepatosplenomegaly  Genitalia:    Deferred to GYN     Extremities:   No clubbing, cyanosis or edema  Pulses:   2+ and symmetric all extremities  Skin:   Skin color, texture, turgor normal, no rashes or lesions  Lymph nodes:   Cervical, supraclavicular, and axillary nodes normal  Neurologic:   CNII-XII intact, normal strength, sensation and gait; reflexes 2+ and symmetric throughout          Psych:   Normal mood, affect, hygiene and grooming.    Hemoglobin A1c 6.4       Assessment & Plan:   1. Routine adult health maintenance  HM COLONOSCOPY  2. Arthritis    3. Glucose intolerance (pre-diabetes)    4. Hypertension    5. Obesity (BMI 30-39.9)    6. Anxiety     explained to her that she is borderline diabetic. Strongly encouraged her  to continue to make diet and exercise changes to reduce her likelihood of becoming a diabetic. She will eventually become a diabetic but the more work she does, further away this will occur. Recommend she continue to do her exercises for her knee. I encouraged her to be supportive of her daughter but to not get overly involved in it. Explained to her that this is truly out of her control and her anxiety over this will not help anybody.

## 2011-04-17 ENCOUNTER — Observation Stay (HOSPITAL_COMMUNITY)
Admission: EM | Admit: 2011-04-17 | Discharge: 2011-04-19 | DRG: 143 | Disposition: A | Discharge status: Home / Self Care | Payer: BC Managed Care – PPO | Attending: Interventional Cardiology | Admitting: Interventional Cardiology

## 2011-04-17 ENCOUNTER — Other Ambulatory Visit: Payer: Self-pay

## 2011-04-17 ENCOUNTER — Encounter (HOSPITAL_COMMUNITY): Payer: Self-pay | Admitting: Emergency Medicine

## 2011-04-17 ENCOUNTER — Emergency Department (HOSPITAL_COMMUNITY): Payer: BC Managed Care – PPO

## 2011-04-17 DIAGNOSIS — R9431 Abnormal electrocardiogram [ECG] [EKG]: Secondary | ICD-10-CM

## 2011-04-17 DIAGNOSIS — Z7982 Long term (current) use of aspirin: Secondary | ICD-10-CM | POA: Insufficient documentation

## 2011-04-17 DIAGNOSIS — I10 Essential (primary) hypertension: Secondary | ICD-10-CM | POA: Insufficient documentation

## 2011-04-17 DIAGNOSIS — E1169 Type 2 diabetes mellitus with other specified complication: Secondary | ICD-10-CM | POA: Insufficient documentation

## 2011-04-17 DIAGNOSIS — E119 Type 2 diabetes mellitus without complications: Secondary | ICD-10-CM | POA: Insufficient documentation

## 2011-04-17 DIAGNOSIS — E785 Hyperlipidemia, unspecified: Secondary | ICD-10-CM

## 2011-04-17 DIAGNOSIS — I119 Hypertensive heart disease without heart failure: Secondary | ICD-10-CM | POA: Insufficient documentation

## 2011-04-17 DIAGNOSIS — R110 Nausea: Secondary | ICD-10-CM | POA: Insufficient documentation

## 2011-04-17 DIAGNOSIS — E669 Obesity, unspecified: Secondary | ICD-10-CM | POA: Insufficient documentation

## 2011-04-17 DIAGNOSIS — R079 Chest pain, unspecified: Principal | ICD-10-CM | POA: Diagnosis present

## 2011-04-17 DIAGNOSIS — R0602 Shortness of breath: Secondary | ICD-10-CM | POA: Insufficient documentation

## 2011-04-17 HISTORY — DX: Atherosclerotic heart disease of native coronary artery without angina pectoris: I25.10

## 2011-04-17 HISTORY — DX: Ventricular premature depolarization: I49.3

## 2011-04-17 HISTORY — DX: Pure hypercholesterolemia, unspecified: E78.00

## 2011-04-17 HISTORY — DX: Anxiety disorder, unspecified: F41.9

## 2011-04-17 HISTORY — DX: Benign lipomatous neoplasm, unspecified: D17.9

## 2011-04-17 HISTORY — DX: Hypertensive heart disease without heart failure: I11.9

## 2011-04-17 HISTORY — DX: Hyperlipidemia, unspecified: E78.5

## 2011-04-17 HISTORY — DX: Palpitations: R00.2

## 2011-04-17 LAB — GLUCOSE, CAPILLARY
Glucose-Capillary: 96 mg/dL (ref 70–99)
Glucose-Capillary: 136 mg/dL — ABNORMAL HIGH (ref 70–99)

## 2011-04-17 LAB — CBC
HCT: 38.8 % (ref 36.0–46.0)
HCT: 37.2 % (ref 36.0–46.0)
Hemoglobin: 12.9 g/dL (ref 12.0–15.0)
Hemoglobin: 12.2 g/dL (ref 12.0–15.0)
MCH: 28.8 pg (ref 26.0–34.0)
MCHC: 33.2 g/dL (ref 30.0–36.0)
MCV: 86.6 fL (ref 78.0–100.0)
Platelets: 261 10*3/uL (ref 150–400)
RBC: 4.48 MIL/uL (ref 3.87–5.11)
RBC: 4.29 MIL/uL (ref 3.87–5.11)
RDW: 14.9 % (ref 11.5–15.5)
WBC: 4.8 10*3/uL (ref 4.0–10.5)
WBC: 5 10*3/uL (ref 4.0–10.5)

## 2011-04-17 LAB — BASIC METABOLIC PANEL
BUN: 8 mg/dL (ref 6–23)
CO2: 28 mEq/L (ref 19–32)
Calcium: 9.4 mg/dL (ref 8.4–10.5)
Chloride: 104 mEq/L (ref 96–112)
Creatinine, Ser: 0.55 mg/dL (ref 0.50–1.10)
GFR calc Af Amer: >90 mL/min (ref >90)
GFR calc non Af Amer: >90 mL/min (ref >90)
Glucose, Bld: 115 mg/dL — ABNORMAL HIGH (ref 70–99)
Potassium: 3.5 mEq/L (ref 3.5–5.1)
Sodium: 141 mEq/L (ref 135–145)

## 2011-04-17 LAB — DIFFERENTIAL
Lymphocytes Relative: 46 % (ref 12–46)
Monocytes Absolute: 0.5 10*3/uL (ref 0.1–1.0)
Monocytes Relative: 10 % (ref 3–12)
Neutro Abs: 1.9 10*3/uL (ref 1.7–7.7)
Neutrophils Relative %: 38 % — ABNORMAL LOW (ref 43–77)

## 2011-04-17 LAB — COMPREHENSIVE METABOLIC PANEL
ALT: 22 U/L (ref 0–35)
Albumin: 3.2 g/dL — ABNORMAL LOW (ref 3.5–5.2)
Alkaline Phosphatase: 103 U/L (ref 39–117)
BUN: 7 mg/dL (ref 6–23)
Chloride: 104 mEq/L (ref 96–112)
GFR calc Af Amer: >90 mL/min (ref >90)
Glucose, Bld: 95 mg/dL (ref 70–99)
Potassium: 3.2 mEq/L — ABNORMAL LOW (ref 3.5–5.1)
Sodium: 141 mEq/L (ref 135–145)
Total Bilirubin: 0.3 mg/dL (ref 0.3–1.2)
Total Protein: 7 g/dL (ref 6.0–8.3)

## 2011-04-17 LAB — APTT: aPTT: 39 seconds — ABNORMAL HIGH (ref 24–37)

## 2011-04-17 LAB — PROTIME-INR: INR: 1.06 (ref 0.00–1.49)

## 2011-04-17 LAB — CARDIAC PANEL(CRET KIN+CKTOT+MB+TROPI)
CK, MB: 2.4 ng/mL (ref 0.3–4.0)
Relative Index: INVALID (ref 0.0–2.5)
Total CK: 68 U/L (ref 7–177)
Troponin I: <0.3 ng/mL (ref <0.30)

## 2011-04-17 LAB — TROPONIN I: Troponin I: <0.3 ng/mL (ref <0.30)

## 2011-04-17 MED ORDER — POTASSIUM CHLORIDE CRYS ER 20 MEQ PO TBCR
40.0000 meq | EXTENDED_RELEASE_TABLET | Freq: Once | ORAL | Status: AC
  Administered 2011-04-17: 40 meq via ORAL
  Filled 2011-04-17: qty 2

## 2011-04-17 MED ORDER — NITROGLYCERIN 0.4 MG SL SUBL: 0.4000 mg | SUBLINGUAL_TABLET | SUBLINGUAL | Status: DC | PRN

## 2011-04-17 MED ORDER — AMLODIPINE BESYLATE 5 MG PO TABS
5.0000 mg | ORAL_TABLET | Freq: Every day | ORAL | Status: DC
  Administered 2011-04-17 – 2011-04-19 (×3): 5 mg via ORAL
  Filled 2011-04-17 (×3): qty 1

## 2011-04-17 MED ORDER — TRAMADOL HCL ER 100 MG PO TB24: 100.0000 mg | ORAL_TABLET | Freq: Every day | ORAL | Status: DC | PRN

## 2011-04-17 MED ORDER — LISINOPRIL 40 MG PO TABS
40.0000 mg | ORAL_TABLET | Freq: Every day | ORAL | Status: DC
  Administered 2011-04-17 – 2011-04-19 (×3): 40 mg via ORAL
  Filled 2011-04-17 (×3): qty 1

## 2011-04-17 MED ORDER — METOPROLOL TARTRATE 25 MG PO TABS
25.0000 mg | ORAL_TABLET | Freq: Two times a day (BID) | ORAL | Status: DC
  Administered 2011-04-17 – 2011-04-19 (×4): 25 mg via ORAL
  Filled 2011-04-17 (×5): qty 1

## 2011-04-17 MED ORDER — SIMVASTATIN 20 MG PO TABS
20.0000 mg | ORAL_TABLET | Freq: Every day | ORAL | Status: DC
  Administered 2011-04-17 – 2011-04-18 (×2): 20 mg via ORAL
  Filled 2011-04-17 (×3): qty 1

## 2011-04-17 MED ORDER — SODIUM CHLORIDE 0.9 % IJ SOLN
3.0000 mL | Freq: Two times a day (BID) | INTRAMUSCULAR | Status: DC
  Administered 2011-04-18 – 2011-04-19 (×2): 3 mL via INTRAVENOUS

## 2011-04-17 MED ORDER — HEPARIN SOD (PORCINE) IN D5W 100 UNIT/ML IV SOLN
1250.0000 [IU]/h | INTRAVENOUS | Status: DC
  Administered 2011-04-17 – 2011-04-18 (×2): 1200 [IU]/h via INTRAVENOUS
  Administered 2011-04-19: 1250 [IU]/h via INTRAVENOUS
  Filled 2011-04-17 (×4): qty 250

## 2011-04-17 MED ORDER — HYDROCHLOROTHIAZIDE 12.5 MG PO CAPS
12.5000 mg | ORAL_CAPSULE | Freq: Every day | ORAL | Status: DC
  Administered 2011-04-17 – 2011-04-19 (×3): 12.5 mg via ORAL
  Filled 2011-04-17 (×3): qty 1

## 2011-04-17 MED ORDER — ONDANSETRON HCL 4 MG/2ML IJ SOLN: 4.0000 mg | Freq: Three times a day (TID) | INTRAMUSCULAR | Status: DC | PRN

## 2011-04-17 MED ORDER — POTASSIUM CHLORIDE CRYS ER 10 MEQ PO TBCR
10.0000 meq | EXTENDED_RELEASE_TABLET | Freq: Every day | ORAL | Status: DC
  Administered 2011-04-18: 10 meq via ORAL
  Filled 2011-04-17: qty 1

## 2011-04-17 MED ORDER — SODIUM CHLORIDE 0.9 % IV SOLN
250.0000 mL | INTRAVENOUS | Status: DC | PRN
  Administered 2011-04-17: 10 mL via INTRAVENOUS

## 2011-04-17 MED ORDER — ASPIRIN 81 MG PO CHEW
324.0000 mg | CHEWABLE_TABLET | Freq: Once | ORAL | Status: AC
  Administered 2011-04-17: 324 mg via ORAL
  Filled 2011-04-17: qty 4

## 2011-04-17 MED ORDER — HEPARIN BOLUS VIA INFUSION
4000.0000 [IU] | Freq: Once | INTRAVENOUS | Status: AC
  Administered 2011-04-17: 4000 [IU] via INTRAVENOUS
  Filled 2011-04-17: qty 4000

## 2011-04-17 MED ORDER — ACETAMINOPHEN 325 MG PO TABS
650.0000 mg | ORAL_TABLET | ORAL | Status: DC | PRN
Start: 2011-04-17 — End: 2011-04-19

## 2011-04-17 MED ORDER — TRAMADOL HCL 50 MG PO TABS
50.0000 mg | ORAL_TABLET | Freq: Four times a day (QID) | ORAL | Status: DC | PRN
  Administered 2011-04-17 – 2011-04-19 (×4): 50 mg via ORAL
  Filled 2011-04-17 (×4): qty 1

## 2011-04-17 MED ORDER — SODIUM CHLORIDE 0.9 % IJ SOLN: 3.0000 mL | INTRAMUSCULAR | Status: DC | PRN

## 2011-04-17 MED ORDER — SODIUM CHLORIDE 0.9 % IV SOLN: INTRAVENOUS | Status: AC

## 2011-04-17 MED ORDER — ONDANSETRON HCL 4 MG/2ML IJ SOLN: 4.0000 mg | Freq: Four times a day (QID) | INTRAMUSCULAR | Status: DC | PRN

## 2011-04-17 MED ORDER — ASPIRIN EC 81 MG PO TBEC
81.0000 mg | DELAYED_RELEASE_TABLET | Freq: Every day | ORAL | Status: DC
  Administered 2011-04-18 – 2011-04-19 (×2): 81 mg via ORAL
  Filled 2011-04-17 (×2): qty 1

## 2011-04-17 MED ORDER — FLUTICASONE PROPIONATE HFA 44 MCG/ACT IN AERO
1.0000 | INHALATION_SPRAY | Freq: Two times a day (BID) | RESPIRATORY_TRACT | Status: DC
  Administered 2011-04-17 – 2011-04-19 (×4): 1 via RESPIRATORY_TRACT
  Filled 2011-04-17: qty 10.6

## 2011-04-17 MED ORDER — ASPIRIN EC 325 MG PO TBEC: 325.0000 mg | DELAYED_RELEASE_TABLET | Freq: Every day | ORAL | Status: DC

## 2011-04-17 NOTE — Progress Notes (Signed)
ANTICOAGULATION CONSULT NOTE - Initial Consult  Pharmacy Consult for heparin Indication: CP  No Known Allergies  Patient Measurements: Height: 5' 6.5" (168.9 cm) Weight: 252 lb 6.8 oz (114.5 kg) IBW/kg (Calculated) : 60.45  Adjusted Body Weight: 87.3kg  Vital Signs: Temp: 98.6 F (37 C) (12/01 1723) Temp src: Oral (12/01 1723) BP: 183/96 mmHg (12/01 1723) Pulse Rate: 62  (12/01 1723)  Labs:  Basename 04/17/11 0928 04/17/11 0925  HGB -- 12.9  HCT -- 38.8  PLT -- 261  APTT -- --  LABPROT -- --  INR -- --  HEPARINUNFRC -- --  CREATININE -- 0.55  CKTOTAL -- --  CKMB -- --  TROPONINI <0.30 --   Estimated Creatinine Clearance: 94.5 ml/min (by C-G formula based on Cr of 0.55).  Medical History: Past Medical History  Diagnosis Date  . Hypertensive heart disease without congestive heart failure   . Obesity   . Degenerative joint disease     BOTH KNEES  . PVC (premature ventricular contraction)   . Palpitations   . Asthma   . Diabetes mellitus   . Hypercholesteremia   . Fatty tumor   . CAD (coronary artery disease) 02/15/2007  . Hyperlipidemia 04/17/2011  . Diabetes mellitus type 2, noninsulin dependent   . Anxiety     Medications:  Prescriptions prior to admission  Medication Sig Dispense Refill  . aspirin EC 325 MG tablet Take 325 mg by mouth daily.        . beclomethasone (QVAR) 80 MCG/ACT inhaler Inhale 2 puffs into the lungs daily as needed.        . hydrochlorothiazide (MICROZIDE) 12.5 MG capsule        . lisinopril (PRINIVIL,ZESTRIL) 40 MG tablet        . nitroGLYCERIN (NITROSTAT) 0.4 MG SL tablet Place 0.4 mg under the tongue every 5 (five) minutes as needed. For chest pain       . pravastatin (PRAVACHOL) 40 MG tablet Take 1 tablet (40 mg total) by mouth daily.  30 tablet  0  . traMADol (ULTRAM-ER) 100 MG 24 hr tablet Take 100 mg by mouth daily as needed.         Assessment: 65 yo female with CP to start heparin  Goal of Therapy:  Heparin level  0.3-0.7 units/ml   Plan:  1) Heparin bolus of 4000 units followed by 1200 units/hr 2) Heparin level in 6 hours and daily 3) CBC daily  Benny Lennert 04/17/2011,6:14 PM

## 2011-04-17 NOTE — H&P (Signed)
Admit date: 04/17/2011 Medical record number: [id number] DOB/Age:  [birth year]  65 y.o.  Referring Physician:   Dr. Claudia Pollock emergency room  Primary Cardiologist:  Dr. Verdis Prime  Chief complaint/reason for admission:  Chest pain  HPI:   The patient has a long-standing history of hypertension, obesity and hyperlipidemia and has been under enormous situational stress. In 2008 she had cardiac catheterization done by Dr. Katrinka Blazing that showed nonobstructive coronary artery disease. In addition she had a Cardiolite done in 2009 that was nonischemic. She is obese and does not get much in the way of exercise. She reports significant situational stress related to her daughter in terms of custody of a grandchild.  She presented to the emergency room with chest discomfort that was underneath her left breast described as an ache. It waxed and waned overnight but became persistent. She is unable to describe exactly how long the pain lasts. She took 2 nitroglycerin at home with partial relief but continues to have some vague chest discomfort. The pain is not related to exertion and is not pleuritic. It is not related to food. She denies PND, orthopnea, syncope, or claudication.  She was evaluated by Dr. Katrinka Blazing earlier this year with palpitations and was told that she had PVCs and took metoprolol briefly but is no longer taking it. She also had a knee replacement last year and has been using fairly significant doses of ibuprofen. She does not check her blood pressure at home.   Past Medical History  Diagnosis Date  . Hypertensive heart disease without congestive heart failure   . Obesity   . Degenerative joint disease     BOTH KNEES  . PVC (premature ventricular contraction)   . Palpitations   . Asthma   . Diabetes mellitus   . Hypercholesteremia   . Fatty tumor   . CAD (coronary artery disease) 02/15/2007  . Hyperlipidemia 04/17/2011  . Diabetes mellitus type 2, noninsulin dependent   .  Anxiety       Past Surgical History  Procedure Date  . Hemorroidectomy   . Cesarean section   . Replacement total knee   .   Allergies:  has no known allergies.  Family History  Problem Relation Age of Onset  . Cancer Father   . Diabetes Mother    Social History: Substance Use Topics  . Smoking status: Former Smoker    Quit date: 11/10/1979  . Smokeless tobacco: Never Used  . Alcohol Use: No  Married twice previously, currently widowed. Grandson occasionally stays with her.   Review of Systems: Obese for many years. She wears glasses but has no visual complaints. No skin complaints. No eye ear nose or throat complaints. Mild dyspnea with exertion. Occasional constipation. Some urinary frequency. Significant arthritis involving the right knee and states she may have to have surgery for this. No history of stroke or TIA. Significant situational stress and anxiety . Some insomnia.  Other than as noted above, the remainder of the review of systems is normal  Physical Exam: BP 178/73  Pulse 64  Temp(Src) 97.9 F (36.6 C) (Oral)  Resp 20  SpO2 98% General appearance: alert, cooperative, appears stated age and moderately obese Head: Normocephalic, without obvious abnormality Eyes: conjunctivae/corneas clear. PERRL, EOM's intact. Fundi not examined Ears: normal TM's and external ear canals both ears Nose: no discharge Throat: lips, mucosa, and tongue normal; teeth and gums normal Neck: no adenopathy, no carotid bruit, no JVD and supple, symmetrical, trachea midline Back: negative Lungs: clear  to auscultation bilaterally Breasts: normal appearance, no masses or tenderness Heart: regular rate and rhythm, S1, S2 normal, no murmur, click, rub or gallop Abdomen: soft, non-tender; bowel sounds normal; no masses,  no organomegaly Pelvic: deferred Extremities: extremities normal, atraumatic, no cyanosis or edema Pulses: 2+ and symmetric Skin: Skin color, texture, turgor normal. No  rashes or lesions Lymph nodes: No adenopathy Neurologic: Alert and oriented X 3, normal strength and tone. Normal symmetric reflexes. Normal coordination and gait  Labs: CBC:   Lab Results  Component Value Date   WBC 4.8 04/17/2011   HGB 12.9 04/17/2011   HCT 38.8 04/17/2011   MCV 86.6 04/17/2011   PLT 261 04/17/2011   CMP:  Lab 04/17/11 0925  NA 141  K 3.5  CL 104  CO2 28  BUN 8  CREATININE 0.55  CALCIUM 9.4  PROT --  BILITOT --  ALKPHOS --  ALT --  AST --  GLUCOSE 115*   CARDIAC ENZYMES: Lab Results  Component Value Date   CKTOTAL 60 09/29/2007   CKMB 1.2 09/29/2007   TROPONINI <0.30 04/17/2011    Lipid Panel     Component Value Date/Time   CHOL  Value: 138        ATP III CLASSIFICATION:  <200     mg/dL   Desirable  528-413  mg/dL   Borderline High  >=244    mg/dL   High 0/02/2724 3664   TRIG 62 12/01/2006 1047   HDL 41 12/01/2006 1047   CHOLHDL 3.4 12/01/2006 1047   LDLCALC  Value: 85        Total Cholesterol/HDL:CHD Risk Coronary Heart Disease Risk Table                     Men   Women  1/2 Average Risk   3.4   3.3 12/01/2006 1047    EKG: Normal sinus rhythm. Voltage for LVH with ST and T-wave changes in the lateral leads Radiology: Clear lungs. Cardiac size slightly enlarged.    ASSESSMENT AND PLAN:  1. Chest pain persistent with some atypical features 2 coronary artery disease with history of mild obstructive coronary artery disease 3. Uncontrolled hypertensive heart disease 4. Type 2 diabetes mellitus 5. Obesity 6. Hyperlipidemia 7. Situational stress  RECOMMENDATIONS:  Admit to telemetry unit. Obtain serial cardiac enzymes and EKG. Depending on response of chest pain may need evaluation with Cardiolite or with repeat catheterization. At amlodipine for blood pressure control. Add beta blockers.  Signed: Darden Palmer. MD [facility] 04/17/2011, 2:23 PM

## 2011-04-17 NOTE — ED Notes (Signed)
Dr. Juleen China and family member now at bedside.

## 2011-04-17 NOTE — ED Notes (Signed)
Printed two old ekgs from [facility]. 

## 2011-04-17 NOTE — ED Notes (Signed)
Pt reports pain under left breast radiating to left side onset this morning. Pt also reports nausea and diaphoresis.

## 2011-04-17 NOTE — ED Provider Notes (Signed)
History    64yF with CP. Onset this morning around 0600. substernal to under L breast. Associated with nausea and sob. Improved after SL nitro x2. Did not radiate. Currently resolved. Has had intermittent achy feeling in L shoulder over past week. No unusual leg pain or swelling. No fever or chills. No cough. Hx of CAD.  On asa but hasn't taken yet today.  CSN: [id number] Arrival date & time: 04/17/2011  8:41 AM   First MD Initiated Contact with Patient 04/17/11 [phone]      Chief Complaint  Patient presents with  . Chest Pain    (Consider location/radiation/quality/duration/timing/severity/associated sxs/prior treatment) HPI  Past Medical History  Diagnosis Date  . Hypertension   . Obesity   . Degenerative joint disease     BOTH KNEES  . PVC (premature ventricular contraction)   . Palpitations   . Asthma   . Diabetes mellitus   . Hypercholesteremia   . Fatty tumor     Past Surgical History  Procedure Date  . Hemorroidectomy   . Cesarean section   . Replacement total knee     Family History  Problem Relation Age of Onset  . Cancer Father     History  Substance Use Topics  . Smoking status: Former Smoker    Quit date: 11/10/1979  . Smokeless tobacco: Not on file  . Alcohol Use: No    OB History    Grav Para Term Preterm Abortions TAB SAB Ect Mult Living                  Review of Systems   Review of symptoms negative unless otherwise noted in HPI.   Allergies  Review of patient's allergies indicates no known allergies.  Home Medications   Current Outpatient Rx  Name Route Sig Dispense Refill  . ASPIRIN EC 325 MG PO TBEC Oral Take 325 mg by mouth daily.      . BECLOMETHASONE DIPROPIONATE 80 MCG/ACT IN AERS Inhalation Inhale 2 puffs into the lungs daily as needed.      Marland Kitchen HYDROCHLOROTHIAZIDE 12.5 MG PO CAPS       . IBUPROFEN 200 MG PO TABS Oral Take 200-800 mg by mouth every 6 (six) hours as needed. For pain     . LISINOPRIL 40 MG PO TABS       .  NITROGLYCERIN 0.4 MG SL SUBL Sublingual Place 0.4 mg under the tongue every 5 (five) minutes as needed. For chest pain     . PRAVASTATIN SODIUM 40 MG PO TABS Oral Take 1 tablet (40 mg total) by mouth daily. 30 tablet 0    No more refills till pt has ov  . ORLISTAT 120 MG PO CAPS Oral Take 1 capsule (120 mg total) by mouth 3 (three) times daily with meals. 90 capsule 11  . TRAMADOL HCL 100 MG PO TB24 Oral Take 100 mg by mouth daily as needed.       BP 172/87  Pulse 61  Temp(Src) 97.9 F (36.6 C) (Oral)  Resp 20  SpO2 100%  Physical Exam  Nursing note and vitals reviewed. Constitutional: She appears well-developed and well-nourished. No distress.       obese  HENT:  Head: Normocephalic and atraumatic.  Eyes: Conjunctivae are normal. Right eye exhibits no discharge. Left eye exhibits no discharge.  Neck: Neck supple.  Cardiovascular: Normal rate, regular rhythm and normal heart sounds.  Exam reveals no gallop and no friction rub.   No murmur heard. Pulmonary/Chest:  Effort normal and breath sounds normal. No respiratory distress. She exhibits no tenderness.  Abdominal: Soft. She exhibits no distension. There is no tenderness.  Musculoskeletal: She exhibits no edema and no tenderness.  Neurological: She is alert.  Skin: Skin is warm and dry.  Psychiatric: She has a normal mood and affect. Her behavior is normal. Thought content normal.    ED Course  Procedures (including critical care time)  Labs Reviewed  BASIC METABOLIC PANEL - Abnormal; Notable for the following:    Glucose, Bld 115 (*)    All other components within normal limits  CBC  TROPONIN I   No results found.  ED ECG REPORT   Date: 04/17/2011  EKG Time: 10:42 AM  Rate: 64  Rhythm: normal sinus rhythm and sinus bradycardia,    Axis: normal  Intervals:none  ST&T Change: t wave inversions inferiorly and laterally  Narrative Interpretation: sinus rhythm with ST changes concerning for ischemia              1. Chest pain   2. EKG abnormalities       MDM  64yF with CP. Concerning story and known CAD. Non obstructing disease on last cath, but this was in 2009. Initial trop wnl. Ischemic EKG changes which are new. Needs admit for further eval. Will discuss with cards.        Raeford Razor, MD 04/19/11 [phone]

## 2011-04-17 NOTE — ED Notes (Signed)
Returned from lunch.  Pt hasn't been moved to holding yet.  Per Denny Peon, pt to now go to room #26.

## 2011-04-18 ENCOUNTER — Other Ambulatory Visit: Payer: Self-pay

## 2011-04-18 LAB — CARDIAC PANEL(CRET KIN+CKTOT+MB+TROPI)
CK, MB: 2.2 ng/mL (ref 0.3–4.0)
Relative Index: INVALID (ref 0.0–2.5)
Troponin I: <0.3 ng/mL (ref <0.30)

## 2011-04-18 LAB — CBC
Hemoglobin: 12.4 g/dL (ref 12.0–15.0)
MCHC: 33 g/dL (ref 30.0–36.0)
Platelets: 212 10*3/uL (ref 150–400)
RDW: 14.9 % (ref 11.5–15.5)

## 2011-04-18 LAB — GLUCOSE, CAPILLARY: Glucose-Capillary: 103 mg/dL — ABNORMAL HIGH (ref 70–99)

## 2011-04-18 LAB — BASIC METABOLIC PANEL
CO2: 26 mEq/L (ref 19–32)
Calcium: 9.1 mg/dL (ref 8.4–10.5)
GFR calc non Af Amer: >90 mL/min (ref >90)
Sodium: 136 mEq/L (ref 135–145)

## 2011-04-18 LAB — HEPARIN LEVEL (UNFRACTIONATED)
Heparin Unfractionated: 0.35 IU/mL (ref 0.30–0.70)
Heparin Unfractionated: 0.38 IU/mL (ref 0.30–0.70)

## 2011-04-18 LAB — HEMOGLOBIN A1C: Mean Plasma Glucose: 137 mg/dL — ABNORMAL HIGH (ref <117)

## 2011-04-18 MED ORDER — POTASSIUM CHLORIDE 20 MEQ/15ML (10%) PO LIQD
20.0000 meq | Freq: Every day | ORAL | Status: DC
  Filled 2011-04-18: qty 15

## 2011-04-18 MED ORDER — POTASSIUM CHLORIDE CRYS ER 20 MEQ PO TBCR: 20.0000 meq | EXTENDED_RELEASE_TABLET | Freq: Every day | ORAL | Status: DC

## 2011-04-18 MED ORDER — POTASSIUM CHLORIDE CRYS ER 20 MEQ PO TBCR
20.0000 meq | EXTENDED_RELEASE_TABLET | Freq: Every day | ORAL | Status: DC
  Administered 2011-04-18 – 2011-04-19 (×2): 20 meq via ORAL
  Filled 2011-04-18 (×2): qty 1

## 2011-04-18 NOTE — Progress Notes (Signed)
Subjective:  Had some vague chest pain earlier this morning. She denies recurrent pain since then and continues on heparin. All enzymes are negative. EKG shows some T wave inversions inferiorly with some minor changes laterally. Blood pressure is still elevated.  Objective:  Vital Signs in the last 24 hours: BP 145/84  Pulse 59  Temp(Src) 97.6 F (36.4 C) (Oral)  Resp 19  Ht 5' 6.5" (1.689 m)  Wt 114.5 kg (252 lb 6.8 oz)  BMI 40.13 kg/m2  SpO2 98%  Physical Exam: Severely obese black female in no acute distress Lungs:  Clear to A&P Cardiac:  Regular rhythm, normal S1 and S2, no S3 Abdomen:  Soft, nontender, no masses Extremities:  No edema present  Intake/Output from previous day: 12/01 0701 - 12/02 0700 In: 522.4 [P.O.:240; I.V.:282.4] Out: -   Lab Results: Basic Metabolic Panel:  [facility] 04/18/11 0630 04/17/11 1757  NA 136 141  K 3.5 3.2*  CL 103 104  CO2 26 30  GLUCOSE 107* 95  BUN 14 7  CREATININE 0.59 0.53   CBC:  Basename 04/18/11 0630 04/17/11 1757  WBC 5.3 5.0  NEUTROABS -- 1.9  HGB 12.4 12.2  HCT 37.6 37.2  MCV 86.6 86.7  PLT 212 254   Cardiac Enzymes:  Basename 04/17/11 2351 04/17/11 1757 04/17/11 0928  CKTOTAL 63 68 --  CKMB 2.3 2.4 --  CKMBINDEX -- -- --  TROPONINI <0.30 <0.30 <0.30   BNP:  Telemetry: Normal sinus rhythm  Assessment/Plan:  1. Chest pain precipitated by situational stress with negative enzymes 2. Hypertensive heart disease 3. Hypokalemia  Recommendations:  Continue heparin overnight. I will keep n.p.o. for further testing by Dr. Katrinka Blazing depending on his wishes tomorrow.     Darden Palmer.  MD [facility] 04/18/2011, 11:58 AM

## 2011-04-18 NOTE — Progress Notes (Signed)
ANTICOAGULATION CONSULT NOTE - Follow Up Consult  Pharmacy Consult for Heparin Indication: CP  No Known Allergies  Patient Measurements: Height: 5' 6.5" (168.9 cm) Weight: 252 lb 6.8 oz (114.5 kg) IBW/kg (Calculated) : 60.45  Heparin Dosing Weight: 0.38  Vital Signs: Temp: 98.1 F (36.7 C) (12/01 2153) Temp src: Oral (12/01 2153) BP: 146/72 mmHg (12/01 2331) Pulse Rate: 60  (12/01 2331)  Labs:  Basename 04/17/11 2352 04/17/11 2351 04/17/11 1757 04/17/11 0928 04/17/11 0925  HGB -- -- 12.2 -- 12.9  HCT -- -- 37.2 -- 38.8  PLT -- -- 254 -- 261  APTT -- -- 39* -- --  LABPROT -- -- 14.0 -- --  INR -- -- 1.06 -- --  HEPARINUNFRC 0.38 -- -- -- --  CREATININE -- -- 0.53 -- 0.55  CKTOTAL -- 63 68 -- --  CKMB -- 2.3 2.4 -- --  TROPONINI -- <0.30 <0.30 <0.30 --   Estimated Creatinine Clearance: 94.5 ml/min (by C-G formula based on Cr of 0.53).   Medications:  Scheduled:    . sodium chloride   Intravenous STAT  . amLODipine  5 mg Oral Daily  . aspirin  324 mg Oral Once  . aspirin EC  81 mg Oral Daily  . fluticasone  1 puff Inhalation BID  . heparin  4,000 Units Intravenous Once  . hydrochlorothiazide  12.5 mg Oral Daily  . lisinopril  40 mg Oral Daily  . metoprolol tartrate  25 mg Oral BID  . potassium chloride  10 mEq Oral Daily  . potassium chloride  40 mEq Oral Once  . simvastatin  20 mg Oral q1800  . sodium chloride  3 mL Intravenous Q12H  . DISCONTD: aspirin EC  325 mg Oral Daily   Goal of Therapy:  Heparin level 0.3-0.7 units/ml  Assessment: 65 yo F on therapeutic heparin for atypical CP.  No bleeding noted.    Plan:  Continue heparin at 1200 units/hr.  Confirm therapeutic level with AM labs.  Sandra Lawrence, Judie Bonus 04/18/2011,1:08 AM

## 2011-04-18 NOTE — Progress Notes (Signed)
ANTICOAGULATION CONSULT NOTE - Follow Up Consult  Pharmacy Consult for Heparin Indication: chest pain/ACS  No Known Allergies  Patient Measurements: Height: 5' 6.5" (168.9 cm) Weight: 252 lb 6.8 oz (114.5 kg) IBW/kg (Calculated) : 60.45   Vital Signs: Temp: 97.6 F (36.4 C) (12/02 0614) Temp src: Oral (12/02 0614) BP: 145/84 mmHg (12/02 0614) Pulse Rate: 59  (12/02 0614)  Labs:  Basename 04/18/11 1147 04/18/11 1140 04/18/11 0630 04/17/11 2352 04/17/11 2351 04/17/11 1757 04/17/11 0925  HGB -- -- 12.4 -- -- 12.2 --  HCT -- -- 37.6 -- -- 37.2 38.8  PLT -- -- 212 -- -- 254 261  APTT -- -- -- -- -- 39* --  LABPROT -- -- -- -- -- 14.0 --  INR -- -- -- -- -- 1.06 --  HEPARINUNFRC 0.35 -- -- 0.38 -- -- --  CREATININE -- -- 0.59 -- -- 0.53 0.55  CKTOTAL -- 57 -- -- 63 68 --  CKMB -- 2.2 -- -- 2.3 2.4 --  TROPONINI -- <0.30 -- -- <0.30 <0.30 --   Estimated Creatinine Clearance: 94.5 ml/min (by C-G formula based on Cr of 0.59).   Medications:  Infusions:    . heparin 1,200 Units/hr (04/18/11 1035)    Assessment: 65 y/o female patient admitted with chest pain receiving heparin infusion for r/o MI. All ce negative. No bleeding reported. Heparin level therapeutic but on low side of goal. Will increase gtt slightly.  Goal of Therapy:  Heparin level 0.3-0.7 units/ml   Plan:  Increase heparin gtt to 1250 units/hr and follow up in am.  Verlene Mayer, PharmD, BCPS Pager [phone]  04/18/2011,1:04 PM

## 2011-04-19 ENCOUNTER — Other Ambulatory Visit: Payer: Self-pay | Admitting: Family Medicine

## 2011-04-19 ENCOUNTER — Other Ambulatory Visit: Payer: Self-pay

## 2011-04-19 LAB — CBC
HCT: 38 % (ref 36.0–46.0)
MCHC: 32.4 g/dL (ref 30.0–36.0)
MCV: 86.6 fL (ref 78.0–100.0)
Platelets: 230 10*3/uL (ref 150–400)
RDW: 15.1 % (ref 11.5–15.5)

## 2011-04-19 LAB — BASIC METABOLIC PANEL
BUN: 14 mg/dL (ref 6–23)
Creatinine, Ser: 0.57 mg/dL (ref 0.50–1.10)
GFR calc Af Amer: >90 mL/min (ref >90)
GFR calc non Af Amer: >90 mL/min (ref >90)

## 2011-04-19 LAB — HEPARIN LEVEL (UNFRACTIONATED): Heparin Unfractionated: 0.48 IU/mL (ref 0.30–0.70)

## 2011-04-19 MED ORDER — TRAMADOL HCL ER 100 MG PO TB24: 100.0000 mg | ORAL_TABLET | Freq: Every day | ORAL | Status: DC | PRN

## 2011-04-19 NOTE — Telephone Encounter (Signed)
PT WANTS REFILL ON TRAMADOL 100 MG    PT BEING RELEASED FROM HOSPITAL TODAY FOR CHEST PAINS

## 2011-04-19 NOTE — Discharge Summary (Signed)
Patient ID: Sandra Lawrence MRN: [id number] DOB/AGE: 1946-01-22 65 y.o.  Admit date: 04/17/2011 Discharge date: 04/19/2011  Primary Discharge Diagnosis: Palpitations and chest pain. Myocardial infarction ruled out Secondary Discharge Diagnosis 1. Nonobstructive coronary disease previous coronary angiography  2. Diabetes mellitus   3. Hypertension  4. Obesity  5. Hyperlipidemia  Significant Diagnostic Studies: None  Consults: none  Hospital Course: She was admitted to the hospital after awakening with pounding in her chest. She noted for several hours that there was intermittent chest discomfort. She attributes these symptoms to significant stress as mentioned in the history and physical completed by Dr. Donnie Aho. Since admission there is been no recurrence of chest pain. Markers are negative for evidence of ischemia.   Discharge Exam: Blood pressure 135/81, pulse 54, temperature 97.9 F (36.6 C), temperature source Oral, resp. rate 18, height 5' 6.5" (1.689 m), weight 116.6 kg (257 lb 0.9 oz), SpO2 95.00%.    Obese. S4 gallop. One of 6 systolic murmur. Lungs clear. Telemetry reveals no significant arrhythmia. Labs:   Lab Results  Component Value Date   WBC 4.5 04/19/2011   HGB 12.3 04/19/2011   HCT 38.0 04/19/2011   MCV 86.6 04/19/2011   PLT 230 04/19/2011     Lab 04/19/11 0700 04/17/11 1757  NA 137 --  K 3.7 --  CL 101 --  CO2 27 --  BUN 14 --  CREATININE 0.57 --  CALCIUM 9.0 --  PROT -- 7.0  BILITOT -- 0.3  ALKPHOS -- 103  ALT -- 22  AST -- 19  GLUCOSE 111* --   Lab Results  Component Value Date   CKTOTAL 57 04/18/2011   CKMB 2.2 04/18/2011   TROPONINI <0.30 04/18/2011    Lab Results  Component Value Date   CHOL  Value: 138        ATP III CLASSIFICATION:  <200     mg/dL   Desirable  409-811  mg/dL   Borderline High  >=914    mg/dL   High 7/82/9562   Lab Results  Component Value Date   HDL 41 12/01/2006   Lab Results  Component Value Date   LDLCALC  Value:  85        Total Cholesterol/HDL:CHD Risk Coronary Heart Disease Risk Table                     Men   Women  1/2 Average Risk   3.4   3.3 12/01/2006   Lab Results  Component Value Date   TRIG 62 12/01/2006   Lab Results  Component Value Date   CHOLHDL 3.4 12/01/2006   No results found for this basename: LDLDIRECT      Radiology: No acute disease process noted EKG: Ectopic atrial rhythm. Nonspecific inferior T-wave abnormality  FOLLOW UP PLANS AND APPOINTMENTS  Current Discharge Medication List    CONTINUE these medications which have NOT CHANGED   Details  aspirin EC 325 MG tablet Take 325 mg by mouth daily.      beclomethasone (QVAR) 80 MCG/ACT inhaler Inhale 2 puffs into the lungs daily as needed.      hydrochlorothiazide (MICROZIDE) 12.5 MG capsule      lisinopril (PRINIVIL,ZESTRIL) 40 MG tablet      nitroGLYCERIN (NITROSTAT) 0.4 MG SL tablet Place 0.4 mg under the tongue every 5 (five) minutes as needed. For chest pain     pravastatin (PRAVACHOL) 40 MG tablet Take 1 tablet (40 mg total) by mouth daily. Qty: 30  tablet, Refills: 0    traMADol (ULTRAM-ER) 100 MG 24 hr tablet Take 100 mg by mouth daily as needed.       STOP taking these medications     ibuprofen (ADVIL,MOTRIN) 200 MG tablet      orlistat (XENICAL) 120 MG capsule        Follow-up Information    Follow up with Lesleigh Noe (Pharmacologic Nuclear stress test; office will call.)    Contact information:   [address] [employer], [place]. [facility] [place] [address] [phone]          BRING ALL MEDICATIONS WITH YOU TO FOLLOW UP APPOINTMENTS  Time spent with patient to include physician time: 30 minutes Signed: Lesleigh Noe 04/19/2011, 9:14 AM

## 2011-04-20 ENCOUNTER — Other Ambulatory Visit: Payer: Self-pay | Admitting: Internal Medicine

## 2011-04-20 MED ORDER — ORLISTAT 120 MG PO CAPS: 120.0000 mg | ORAL_CAPSULE | Freq: Three times a day (TID) | ORAL | Status: DC

## 2011-04-21 ENCOUNTER — Telehealth: Payer: Self-pay | Admitting: Family Medicine

## 2011-04-21 NOTE — Telephone Encounter (Signed)
DONE

## 2011-06-11 ENCOUNTER — Other Ambulatory Visit: Payer: Self-pay | Admitting: Family Medicine

## 2011-07-08 ENCOUNTER — Ambulatory Visit: Payer: BC Managed Care – PPO | Admitting: Family Medicine

## 2011-07-17 ENCOUNTER — Other Ambulatory Visit: Payer: Self-pay | Admitting: Family Medicine

## 2011-07-19 NOTE — Telephone Encounter (Signed)
Is this ok?

## 2011-07-20 ENCOUNTER — Ambulatory Visit (INDEPENDENT_AMBULATORY_CARE_PROVIDER_SITE_OTHER): Payer: BC Managed Care – PPO | Admitting: Family Medicine

## 2011-07-20 ENCOUNTER — Encounter: Payer: Self-pay | Admitting: Family Medicine

## 2011-07-20 VITALS — BP 132/82 | HR 66 | Wt 261.0 lb

## 2011-07-20 DIAGNOSIS — F489 Nonpsychotic mental disorder, unspecified: Secondary | ICD-10-CM

## 2011-07-20 DIAGNOSIS — F419 Anxiety disorder, unspecified: Secondary | ICD-10-CM

## 2011-07-20 DIAGNOSIS — F411 Generalized anxiety disorder: Secondary | ICD-10-CM

## 2011-07-20 NOTE — Progress Notes (Signed)
  Subjective:    Patient ID: Sandra Lawrence, female    DOB: 1945/07/28, 66 y.o.   MRN: [id number]  HPI She is here for consult concerning difficulty with anxiety. She is helping her daughter deal with custody issues of her grandchild. Apparently a 50 B has not even taken out on him and her daughter. She has 2 transport the grandchild to see the father which apparently is causing a great deal of anxiety. She also recently was evaluated with a sleep study because of difficulty with snoring and possible sleep apnea. She is having difficulty with sleep because of the anxiety.    Review of Systems     Objective:   Physical Exam Alert and in no distress with appropriate affect otherwise not examined.       Assessment & Plan:   1. Insomnia secondary to anxiety   I will refer her for counseling. Defer any anxiety/sleep meds until the sleep study is done.

## 2011-08-16 ENCOUNTER — Other Ambulatory Visit: Payer: Self-pay | Admitting: Family Medicine

## 2011-08-31 ENCOUNTER — Other Ambulatory Visit (HOSPITAL_COMMUNITY): Payer: Self-pay | Admitting: Orthopedic Surgery

## 2011-08-31 DIAGNOSIS — M25562 Pain in left knee: Secondary | ICD-10-CM

## 2011-08-31 DIAGNOSIS — T84038A Mechanical loosening of other internal prosthetic joint, initial encounter: Secondary | ICD-10-CM

## 2011-09-07 ENCOUNTER — Encounter (HOSPITAL_COMMUNITY)
Admission: RE | Admit: 2011-09-07 | Discharge: 2011-09-07 | Disposition: A | Discharge status: Home / Self Care | Payer: BC Managed Care – PPO | Source: Ambulatory Visit | Attending: Orthopedic Surgery | Admitting: Orthopedic Surgery

## 2011-09-07 ENCOUNTER — Ambulatory Visit (HOSPITAL_COMMUNITY)
Admission: RE | Admit: 2011-09-07 | Discharge: 2011-09-07 | Disposition: A | Discharge status: Home / Self Care | Payer: BC Managed Care – PPO | Source: Ambulatory Visit | Attending: Orthopedic Surgery | Admitting: Orthopedic Surgery

## 2011-09-07 DIAGNOSIS — M25569 Pain in unspecified knee: Secondary | ICD-10-CM | POA: Insufficient documentation

## 2011-09-07 DIAGNOSIS — Z96659 Presence of unspecified artificial knee joint: Secondary | ICD-10-CM | POA: Insufficient documentation

## 2011-09-07 DIAGNOSIS — M1710 Unilateral primary osteoarthritis, unspecified knee: Secondary | ICD-10-CM | POA: Insufficient documentation

## 2011-09-07 DIAGNOSIS — M25562 Pain in left knee: Secondary | ICD-10-CM

## 2011-09-07 DIAGNOSIS — R948 Abnormal results of function studies of other organs and systems: Secondary | ICD-10-CM | POA: Insufficient documentation

## 2011-09-07 DIAGNOSIS — T84038A Mechanical loosening of other internal prosthetic joint, initial encounter: Secondary | ICD-10-CM

## 2011-09-07 DIAGNOSIS — IMO0002 Reserved for concepts with insufficient information to code with codable children: Secondary | ICD-10-CM | POA: Insufficient documentation

## 2011-09-07 MED ORDER — TECHNETIUM TC 99M MEDRONATE IV KIT
25.0000 | PACK | Freq: Once | INTRAVENOUS | Status: AC | PRN
  Administered 2011-09-07: 25 via INTRAVENOUS

## 2011-09-19 ENCOUNTER — Other Ambulatory Visit: Payer: Self-pay | Admitting: Family Medicine

## 2011-09-20 NOTE — Telephone Encounter (Signed)
Is this ok?

## 2011-09-20 NOTE — Telephone Encounter (Signed)
Tramadol renewed.

## 2011-10-10 ENCOUNTER — Other Ambulatory Visit: Payer: Self-pay | Admitting: Family Medicine

## 2011-12-01 IMAGING — CR DG CHEST 2V
2 series · 2 of 2 positions shown · non-contrast
Comparison: 09/28/2007

CLINICAL DATA: Preop knee surgery

CHEST - 2 VIEW

[w chest pa]
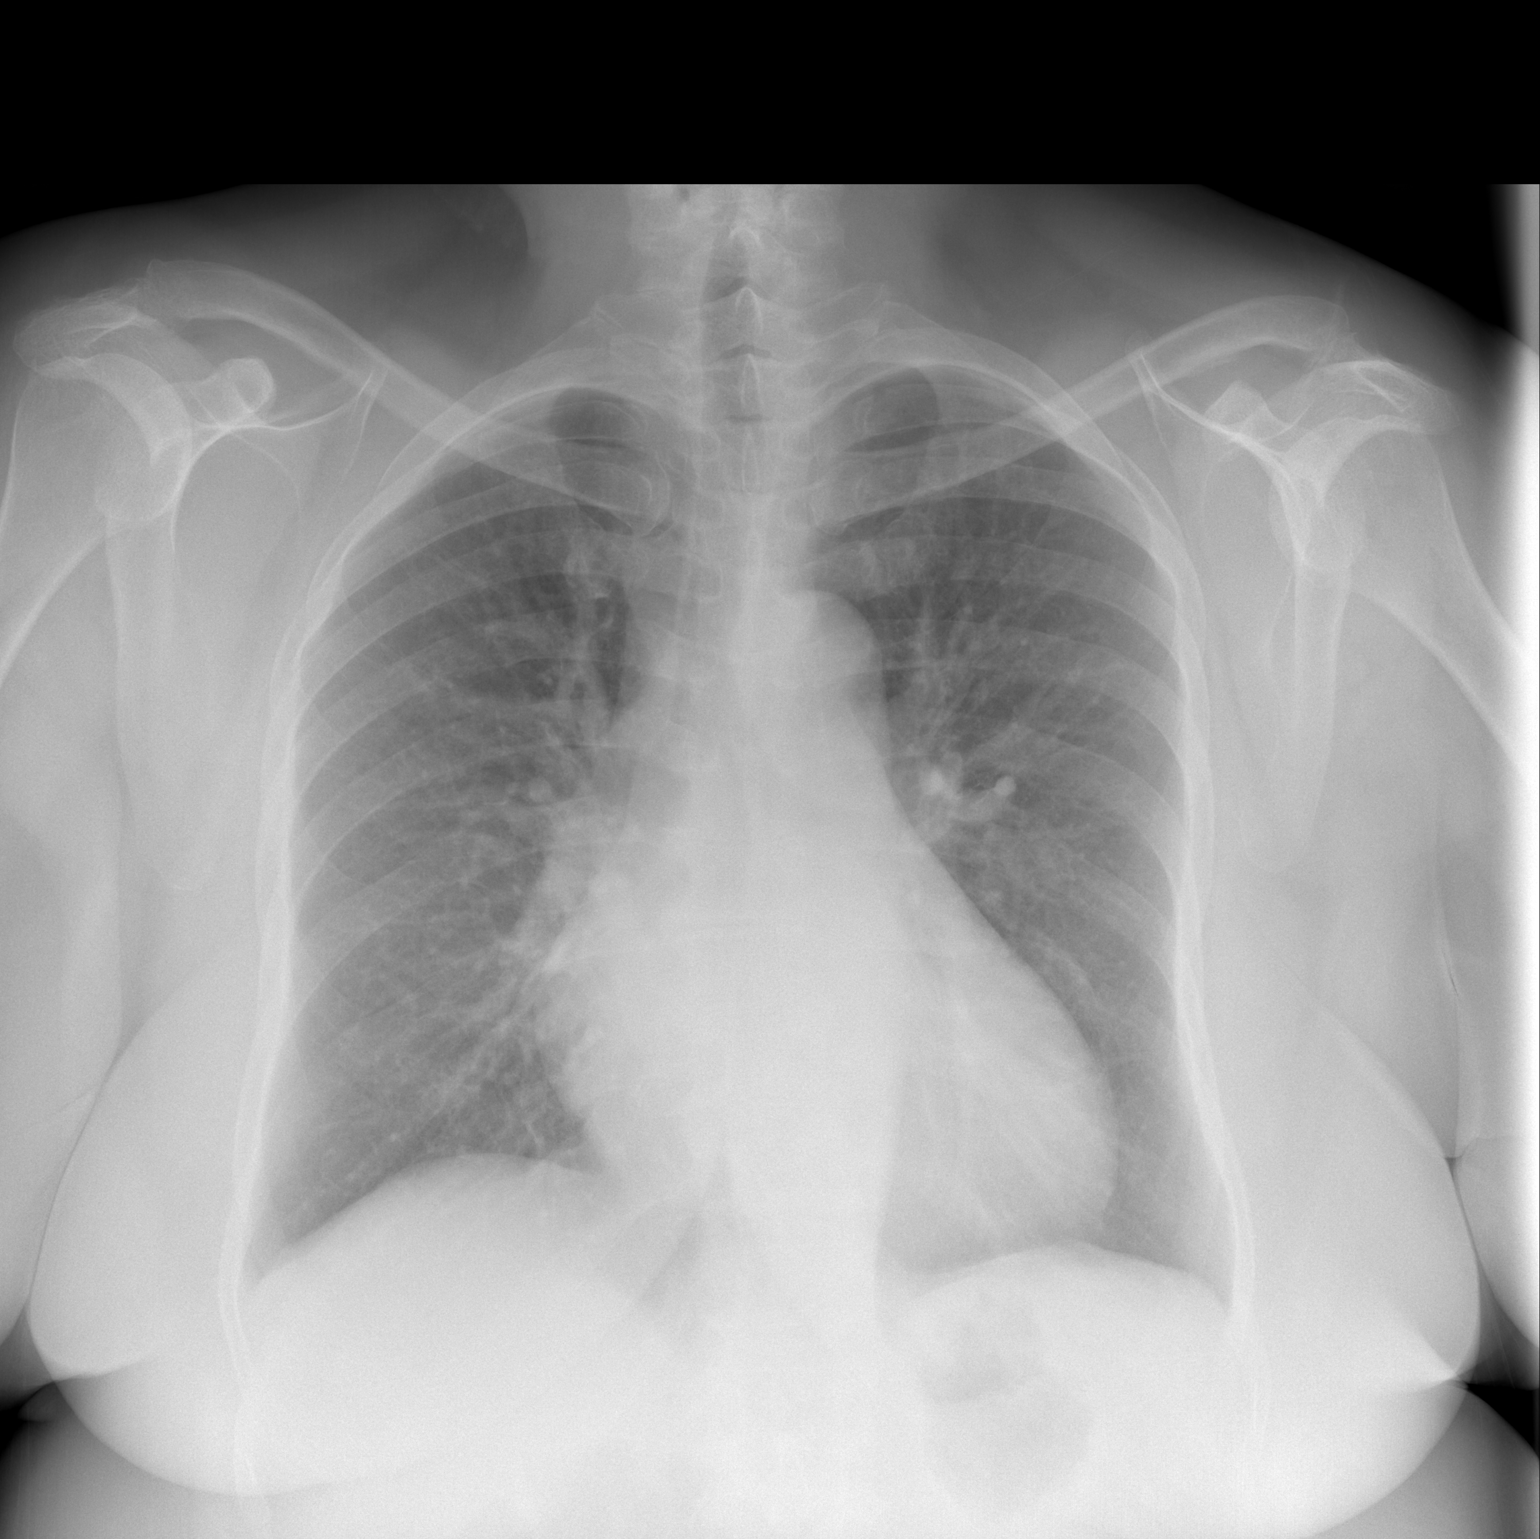

[w chest lat]
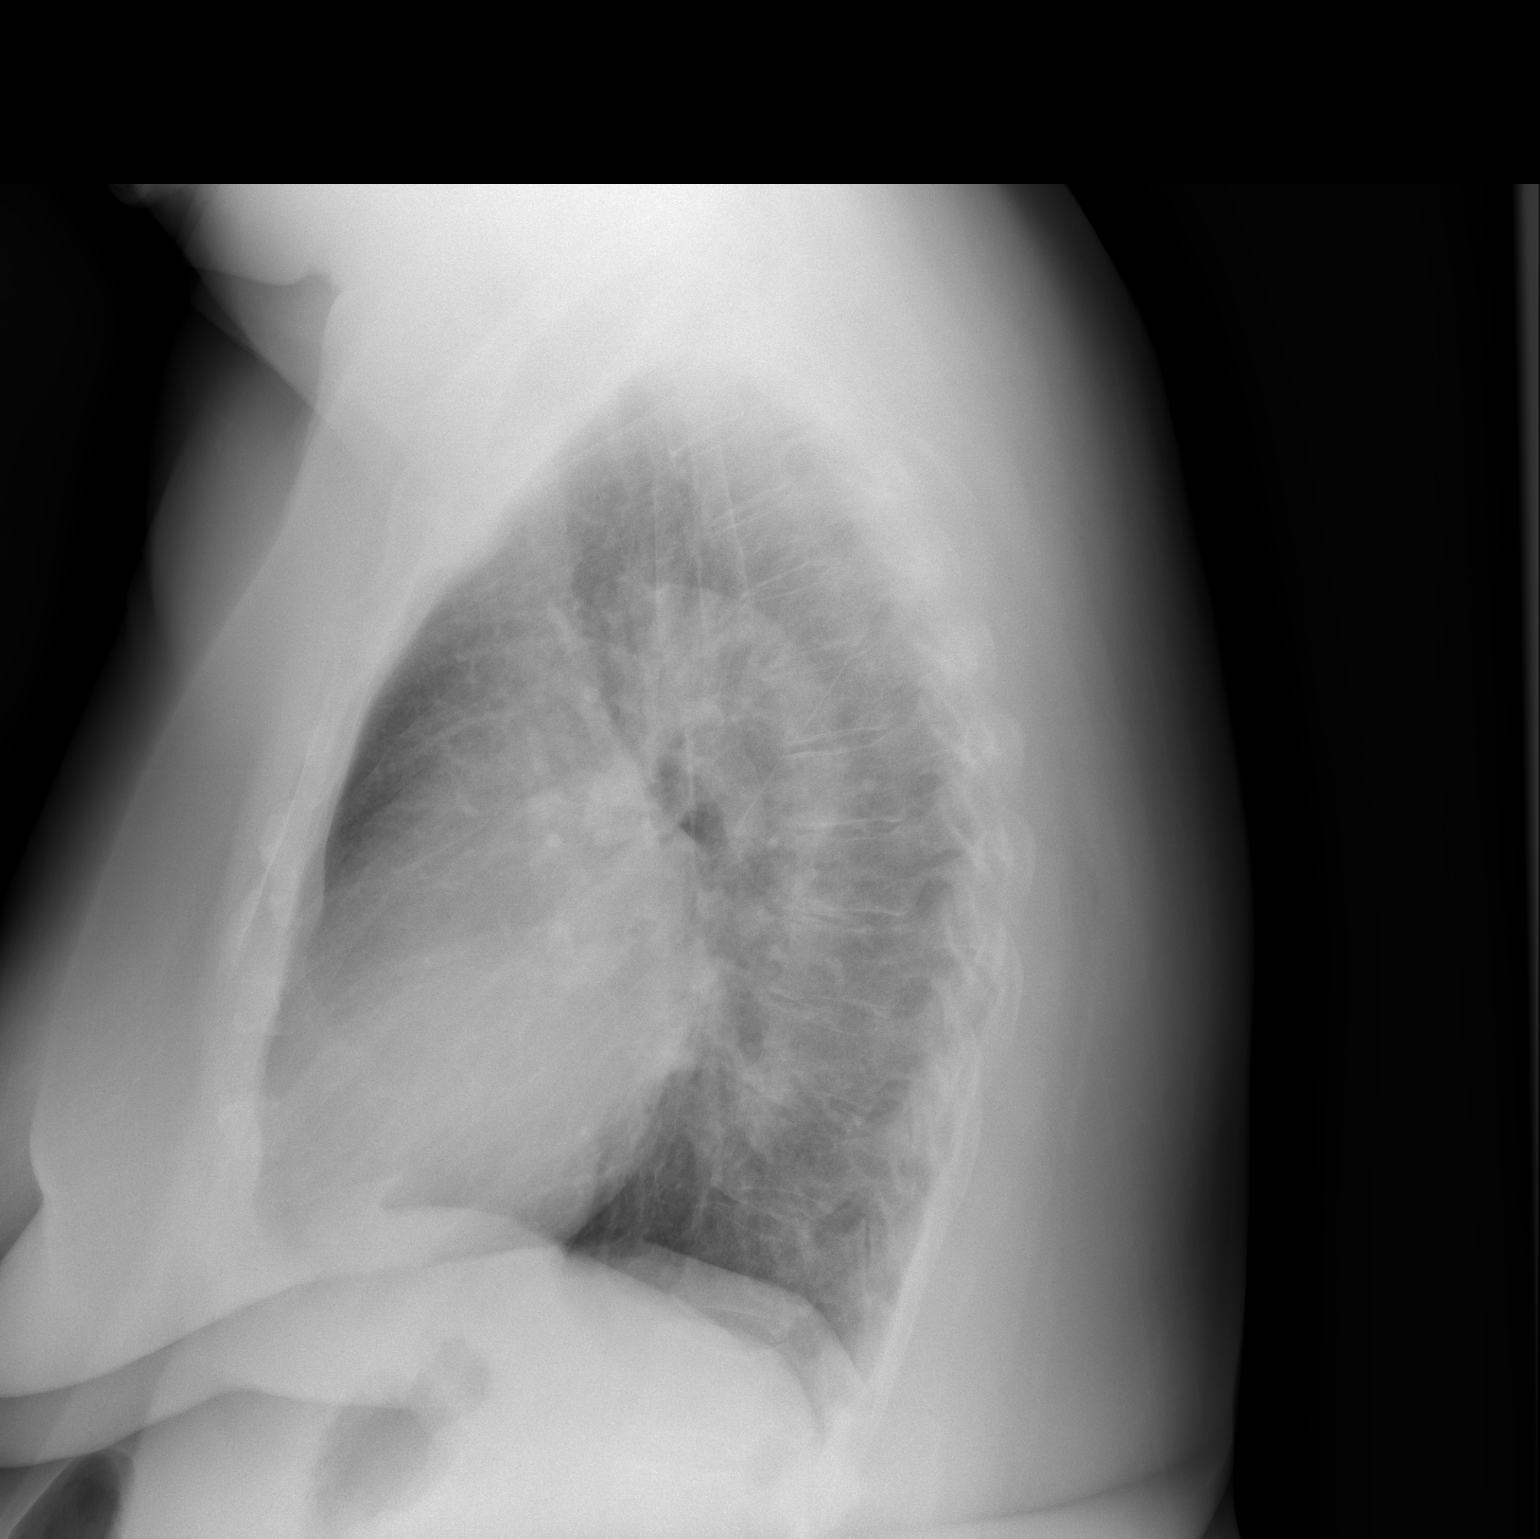

[2 of 2 positions shown; findings below may reference images not displayed]

FINDINGS: Heart size is mildly enlarged.  Mild vascular congestion
without edema or effusion.  Negative for infiltrate.
IMPRESSION: Cardiac enlargement with mild vascular congestion, negative for
edema.

## 2012-01-06 ENCOUNTER — Telehealth: Payer: Self-pay

## 2012-01-06 MED ORDER — ALPRAZOLAM 0.25 MG PO TABS: 0.2500 mg | ORAL_TABLET | Freq: Every evening | ORAL | Status: AC | PRN

## 2012-01-06 NOTE — Telephone Encounter (Signed)
Call in Xanax 0.25 #12. One when necessary for anxiety

## 2012-01-06 NOTE — Telephone Encounter (Signed)
Pt called and request that if you would please call her something in for stress and anxiety due to her sister passed yesterday and funeral is tomorrow she has not been sleeping and dosent know if she can handle the funeral please advise

## 2012-01-06 NOTE — Telephone Encounter (Signed)
CALLED XANAX IN PER JCL 

## 2012-01-30 ENCOUNTER — Other Ambulatory Visit: Payer: Self-pay | Admitting: Family Medicine

## 2012-03-08 ENCOUNTER — Other Ambulatory Visit: Payer: Self-pay | Admitting: Family Medicine

## 2012-05-17 HISTORY — PX: COLONOSCOPY: SHX174

## 2012-05-23 ENCOUNTER — Other Ambulatory Visit: Payer: Self-pay | Admitting: Family Medicine

## 2012-05-23 NOTE — Telephone Encounter (Signed)
She needs a followup med check visit.

## 2012-05-23 NOTE — Telephone Encounter (Signed)
Is this ok last ov 3/13

## 2012-05-24 ENCOUNTER — Other Ambulatory Visit: Payer: Self-pay

## 2012-06-01 ENCOUNTER — Other Ambulatory Visit: Payer: Self-pay | Admitting: Family Medicine

## 2012-06-05 ENCOUNTER — Other Ambulatory Visit: Payer: Self-pay | Admitting: Family Medicine

## 2012-06-16 ENCOUNTER — Encounter: Payer: Self-pay | Admitting: Internal Medicine

## 2012-07-04 ENCOUNTER — Encounter: Payer: Self-pay | Admitting: Internal Medicine

## 2012-07-04 ENCOUNTER — Ambulatory Visit (INDEPENDENT_AMBULATORY_CARE_PROVIDER_SITE_OTHER): Payer: BC Managed Care – PPO | Admitting: Family Medicine

## 2012-07-04 ENCOUNTER — Encounter: Payer: Self-pay | Admitting: Family Medicine

## 2012-07-04 ENCOUNTER — Encounter: Payer: BC Managed Care – PPO | Admitting: Family Medicine

## 2012-07-04 VITALS — BP 140/90 | HR 78 | Ht 66.0 in | Wt 265.0 lb

## 2012-07-04 DIAGNOSIS — R7303 Prediabetes: Secondary | ICD-10-CM

## 2012-07-04 DIAGNOSIS — M129 Arthropathy, unspecified: Secondary | ICD-10-CM

## 2012-07-04 DIAGNOSIS — Z0000 Encounter for general adult medical examination without abnormal findings: Secondary | ICD-10-CM

## 2012-07-04 DIAGNOSIS — R7309 Other abnormal glucose: Secondary | ICD-10-CM

## 2012-07-04 DIAGNOSIS — Z6841 Body Mass Index (BMI) 40.0 and over, adult: Secondary | ICD-10-CM

## 2012-07-04 DIAGNOSIS — E785 Hyperlipidemia, unspecified: Secondary | ICD-10-CM

## 2012-07-04 DIAGNOSIS — M1990 Unspecified osteoarthritis, unspecified site: Secondary | ICD-10-CM

## 2012-07-04 DIAGNOSIS — I119 Hypertensive heart disease without heart failure: Secondary | ICD-10-CM

## 2012-07-04 DIAGNOSIS — G4733 Obstructive sleep apnea (adult) (pediatric): Secondary | ICD-10-CM

## 2012-07-04 LAB — POCT URINALYSIS DIPSTICK
Bilirubin, UA: NEGATIVE
Glucose, UA: NEGATIVE
Ketones, UA: NEGATIVE
Spec Grav, UA: 1.02
Urobilinogen, UA: NEGATIVE

## 2012-07-04 LAB — POCT GLYCOSYLATED HEMOGLOBIN (HGB A1C): Hemoglobin A1C: 6.4

## 2012-07-04 LAB — CBC WITH DIFFERENTIAL/PLATELET
Basophils Relative: 1 % (ref 0–1)
Eosinophils Absolute: 0.1 10*3/uL (ref 0.0–0.7)
Hemoglobin: 12.3 g/dL (ref 12.0–15.0)
MCH: 28.5 pg (ref 26.0–34.0)
MCHC: 32.5 g/dL (ref 30.0–36.0)
Monocytes Relative: 8 % (ref 3–12)
Neutrophils Relative %: 59 % (ref 43–77)

## 2012-07-04 MED ORDER — ORLISTAT 120 MG PO CAPS: 120.0000 mg | ORAL_CAPSULE | Freq: Three times a day (TID) | ORAL | Status: DC

## 2012-07-04 MED ORDER — HYDROCHLOROTHIAZIDE 12.5 MG PO CAPS: ORAL_CAPSULE | ORAL | Status: DC

## 2012-07-04 MED ORDER — LISINOPRIL 40 MG PO TABS: ORAL_TABLET | ORAL | Status: DC

## 2012-07-04 MED ORDER — PRAVASTATIN SODIUM 40 MG PO TABS: 40.0000 mg | ORAL_TABLET | Freq: Every day | ORAL | Status: DC

## 2012-07-04 NOTE — Patient Instructions (Signed)
Take 2 Tylenol 4 times a day and you can also take 2 Aleve 2 or 3 times a day for your arthritis.

## 2012-07-04 NOTE — Progress Notes (Signed)
[facility] April 3 AT 9:30 DR.[facility] MARCH 18 2:20

## 2012-07-04 NOTE — Progress Notes (Signed)
Subjective:    Patient ID: Sandra Lawrence, female    DOB: 1945-07-17, 67 y.o.   MRN: [id number]  HPI She is here for a complete examination. She does complain of snoring but has not been using her CPAP machine but does plan to get supplies and start using it again. She was using an OTC sleep meds but presently is not taking this. She does have arthritis of her hip and knees and recently had an injection by Dr.Olin area he apparently has recommended replacement however she is not ready for this. She is very physically inactive and does have some shortness of breath with minimal physical activity. Her weight has increased. She states that she was diagnosed with diabetes and was placed on metformin several years ago but has not been on this in several years. She admits to dietary indiscretion. She is engaged and her soon-to-be husband is going to move to [place].  She has no other concerns or complaints.   Review of Systems  Constitutional: Negative.   HENT: Negative.   Eyes: Negative.   Respiratory: Negative.   Cardiovascular: Negative.   Gastrointestinal: Negative.   Endocrine: Negative.   Genitourinary: Negative.   Musculoskeletal: Negative.   Allergic/Immunologic: Negative.   Neurological: Negative.   Hematological: Negative.   Psychiatric/Behavioral: Negative.        Objective:   Physical Exam BP 140/90  Pulse 78  Ht 5\' 6"  (1.676 m)  Wt 265 lb (120.203 kg)  BMI 42.79 kg/m2  General Appearance:    Alert, cooperative, no distress, appears stated age  Head:    Normocephalic, without obvious abnormality, atraumatic  Eyes:    PERRL, conjunctiva/corneas clear, EOM's intact, fundi    benign  Ears:    Normal TM's and external ear canals  Nose:   Nares normal, mucosa normal, no drainage or sinus   tenderness  Throat:   Lips, mucosa, and tongue normal; teeth and gums normal  Neck:   Supple, no lymphadenopathy;  thyroid:  no   enlargement/tenderness/nodules; no carotid   bruit or  JVD  Back:    Spine nontender, no curvature, ROM normal, no CVA     tenderness  Lungs:     Clear to auscultation bilaterally without wheezes, rales or     ronchi; respirations unlabored  Chest Wall:    No tenderness or deformity   Heart:    Regular rate and rhythm, S1 and S2 normal, no murmur, rub   or gallop  Breast Exam:    Deferred to GYN  Abdomen:     Soft, non-tender, nondistended, normoactive bowel sounds,    no masses, no hepatosplenomegaly  Genitalia:    Deferred to GYN     Extremities:   No clubbing, cyanosis or edema  Pulses:   2+ and symmetric all extremities  Skin:   Skin color, texture, turgor normal, no rashes or lesions  Lymph nodes:   Cervical, supraclavicular, and axillary nodes normal  Neurologic:   CNII-XII intact, normal strength, sensation and gait; reflexes 2+ and symmetric throughout          Psych:   Normal mood, affect, hygiene and grooming.   Hemoglobin A1c is 6.4        Assessment & Plan:  Routine general medical examination at a health care facility - Plan: Lipid panel, CBC with Differential, Comprehensive metabolic panel, MM Digital Screening, HM COLONOSCOPY  Hypertensive heart disease without CHF - Plan: POCT Urinalysis Dipstick, hydrochlorothiazide (MICROZIDE) 12.5 MG capsule, lisinopril (PRINIVIL,ZESTRIL) 40  MG tablet  Arthritis  Hyperlipidemia - Plan: pravastatin (PRAVACHOL) 40 MG tablet  Morbid obesity with BMI of 40.0-44.9, adult - Plan: orlistat (XENICAL) 120 MG capsule  Obstructive sleep apnea  Glucose intolerance (pre-diabetes) I discussed her lifestyle in regard to its effect on her overall health including diabetes, sleep apnea, arthritis etc. Explained that making lifestyle changes in regard to diet and exercise is the key to helping reduce her risk of all these variables. She apparently had been on Xenical in the past and like to be placed on this again to help with weight reduction. She is to return here in several months for recheck  area at all her meds were renewed.

## 2012-07-05 LAB — LIPID PANEL
Cholesterol: 201 mg/dL — ABNORMAL HIGH (ref 0–200)
HDL: 52 mg/dL (ref >39)
Total CHOL/HDL Ratio: 3.9 Ratio
Triglycerides: 59 mg/dL (ref <150)
VLDL: 12 mg/dL (ref 0–40)

## 2012-07-05 LAB — COMPREHENSIVE METABOLIC PANEL
Alkaline Phosphatase: 108 U/L (ref 39–117)
BUN: 16 mg/dL (ref 6–23)
Glucose, Bld: 98 mg/dL (ref 70–99)
Sodium: 139 mEq/L (ref 135–145)
Total Bilirubin: 0.5 mg/dL (ref 0.3–1.2)
Total Protein: 6.5 g/dL (ref 6.0–8.3)

## 2012-07-06 NOTE — Progress Notes (Signed)
Quick Note:  PT INFORMED AND VERBALIZED UNDERSTANDING  ______ 

## 2012-08-01 ENCOUNTER — Ambulatory Visit: Admission: RE | Admit: 2012-08-01 | Payer: BC Managed Care – PPO | Source: Ambulatory Visit

## 2012-08-01 ENCOUNTER — Ambulatory Visit
Admission: RE | Admit: 2012-08-01 | Discharge: 2012-08-01 | Disposition: A | Discharge status: Home / Self Care | Payer: BC Managed Care – PPO | Source: Ambulatory Visit | Attending: Family Medicine | Admitting: Family Medicine

## 2012-08-01 ENCOUNTER — Other Ambulatory Visit: Payer: Self-pay | Admitting: Family Medicine

## 2012-08-01 DIAGNOSIS — Z1231 Encounter for screening mammogram for malignant neoplasm of breast: Secondary | ICD-10-CM

## 2012-08-03 ENCOUNTER — Ambulatory Visit (AMBULATORY_SURGERY_CENTER): Payer: BC Managed Care – PPO | Admitting: *Deleted

## 2012-08-03 ENCOUNTER — Encounter: Payer: Self-pay | Admitting: Internal Medicine

## 2012-08-03 VITALS — Ht 68.0 in | Wt 268.0 lb

## 2012-08-03 DIAGNOSIS — Z1211 Encounter for screening for malignant neoplasm of colon: Secondary | ICD-10-CM

## 2012-08-03 MED ORDER — PEG-KCL-NACL-NASULF-NA ASC-C 100 G PO SOLR: ORAL | Status: DC

## 2012-08-03 NOTE — Progress Notes (Signed)
No egg or soy allergy 

## 2012-08-15 ENCOUNTER — Ambulatory Visit (AMBULATORY_SURGERY_CENTER): Payer: BC Managed Care – PPO | Admitting: Internal Medicine

## 2012-08-15 ENCOUNTER — Encounter: Payer: Self-pay | Admitting: Internal Medicine

## 2012-08-15 VITALS — BP 127/50 | HR 53 | Temp 96.6°F | Resp 17 | Ht 68.0 in | Wt 268.0 lb

## 2012-08-15 DIAGNOSIS — D126 Benign neoplasm of colon, unspecified: Secondary | ICD-10-CM

## 2012-08-15 DIAGNOSIS — K635 Polyp of colon: Secondary | ICD-10-CM

## 2012-08-15 DIAGNOSIS — Z1211 Encounter for screening for malignant neoplasm of colon: Secondary | ICD-10-CM

## 2012-08-15 MED ORDER — SODIUM CHLORIDE 0.9 % IV SOLN: 500.0000 mL | INTRAVENOUS | Status: DC

## 2012-08-15 NOTE — Op Note (Signed)
[facility] [address] [place], [address]   COLONOSCOPY PROCEDURE REPORT  PATIENT: Sandra Lawrence, Sandra Lawrence  MR#: [id number] BIRTHDATE: [birth year] , 66  yrs. old GENDER: Female ENDOSCOPIST: Beverley Fiedler, MD REFERRED XB:JYNW Susann Givens, M.D. PROCEDURE DATE:  08/15/2012 PROCEDURE:   Colonoscopy with snare polypectomy and Colonoscopy with cold biopsy polypectomy ASA CLASS:   Class III INDICATIONS:average risk screening and first colonoscopy. MEDICATIONS: MAC sedation, administered by CRNA and propofol (Diprivan) 300mg  IV  DESCRIPTION OF PROCEDURE:   After the risks benefits and alternatives of the procedure were thoroughly explained, informed consent was obtained.  A digital rectal exam revealed no rectal mass.   The LB CF-[id number]  endoscope was introduced through the anus and advanced to the cecum, which was identified by both the appendix and ileocecal valve. No adverse events experienced. The quality of the prep was Moviprep fair  The instrument was then slowly withdrawn as the colon was fully examined.    COLON FINDINGS: Two sessile polyps measuring 3-5 mm in size were found in the proximal transverse colon.  Polypectomy was performed with cold forceps and using cold snare.  All resections were complete and all polyp tissue was completely retrieved.   There was mild scattered diverticulosis noted in the left colon.  Retroflexed views revealed no abnormalities. The time to cecum=4 minutes 56 seconds.  Withdrawal time=13 minutes 20 seconds.  The scope was withdrawn and the procedure completed. COMPLICATIONS: There were no complications.  ENDOSCOPIC IMPRESSION: 1.   Two sessile polyps measuring 3-5 mm in size were found in the proximal transverse colon; Polypectomy was performed with cold forceps and using cold snare 2.   There was mild diverticulosis noted in the left colon  RECOMMENDATIONS: 1.  Await pathology results 2.  High fiber diet 3.  If the  polyps removed today are proven to be adenomatous (pre-cancerous) polyps, you will need a repeat colonoscopy in 5 years.  Otherwise you should continue to follow colorectal cancer screening guidelines for "routine risk" patients with colonoscopy in 10 years.  You will receive a letter within 1-2 weeks with the results of your biopsy as well as final recommendations.  Please call my office if you have not received a letter after 3 weeks.   eSigned:  Beverley Fiedler, MD 08/15/2012 12:28 PM cc: The Patient

## 2012-08-15 NOTE — Patient Instructions (Addendum)

## 2012-08-16 ENCOUNTER — Telehealth: Payer: Self-pay | Admitting: *Deleted

## 2012-08-16 NOTE — Telephone Encounter (Signed)
  Follow up Call-  Call back number 08/15/2012  Post procedure Call Back phone  # [phone]  Permission to leave phone message Yes     Patient questions:  Do you have a fever, pain , or abdominal swelling? no Pain Score  0 *  Have you tolerated food without any problems? yes  Have you been able to return to your normal activities? yes  Do you have any questions about your discharge instructions: Diet   no Medications  no Follow up visit  no  Do you have questions or concerns about your Care? no  Actions: * If pain score is 4 or above: No action needed, pain <4.

## 2012-08-17 ENCOUNTER — Encounter: Payer: BC Managed Care – PPO | Admitting: Internal Medicine

## 2012-08-22 ENCOUNTER — Encounter: Payer: Self-pay | Admitting: Internal Medicine

## 2012-09-05 ENCOUNTER — Encounter: Payer: Self-pay | Admitting: Family Medicine

## 2012-09-05 ENCOUNTER — Ambulatory Visit (INDEPENDENT_AMBULATORY_CARE_PROVIDER_SITE_OTHER): Payer: BC Managed Care – PPO | Admitting: Family Medicine

## 2012-09-05 VITALS — BP 136/90 | HR 82 | Wt 261.0 lb

## 2012-09-05 DIAGNOSIS — Z860100 Personal history of colon polyps, unspecified: Secondary | ICD-10-CM

## 2012-09-05 DIAGNOSIS — J3489 Other specified disorders of nose and nasal sinuses: Secondary | ICD-10-CM

## 2012-09-05 DIAGNOSIS — E119 Type 2 diabetes mellitus without complications: Secondary | ICD-10-CM

## 2012-09-05 DIAGNOSIS — I119 Hypertensive heart disease without heart failure: Secondary | ICD-10-CM

## 2012-09-05 DIAGNOSIS — M1990 Unspecified osteoarthritis, unspecified site: Secondary | ICD-10-CM

## 2012-09-05 DIAGNOSIS — M129 Arthropathy, unspecified: Secondary | ICD-10-CM

## 2012-09-05 DIAGNOSIS — E785 Hyperlipidemia, unspecified: Secondary | ICD-10-CM

## 2012-09-05 DIAGNOSIS — G4730 Sleep apnea, unspecified: Secondary | ICD-10-CM | POA: Insufficient documentation

## 2012-09-05 HISTORY — DX: Personal history of colonic polyps: Z86.010

## 2012-09-05 HISTORY — DX: Personal history of colon polyps, unspecified: Z86.0100

## 2012-09-05 NOTE — Patient Instructions (Signed)
Go out for 5 minute walks but do that several times per day

## 2012-09-05 NOTE — Progress Notes (Signed)
  Subjective:    Patient ID: Sandra Lawrence, female    DOB: 10/19/1945, 67 y.o.   MRN: [id number]  HPI She is here for a recheck.  Review of her record indicates she was seen by cardiology in 2012. She is having some continued shortness of breath. She also is had difficulty with knee knee and hip pain especially on the left. She has had a previous left TKR. She was seen by her orthopedic surgeon and given an injection into her right knee and hip area which she states has helped. She has lost a few pounds since last being seen but has not been able to exercise with any regularity. She has had a colonoscopy which did show tubular adenoma. She has not been using her CPAP but does plan to use it again. She recognizes that when she does use it, she does have more energy. She continues on her present medication regimen. She does have a lesion present on her nose that she would like evaluated. She has had a mammogram and will have a Pap done next year.   Review of Systems     Objective:   Physical Exam Alert and in no distress. A 2-3 mm lesion is noted on the tip of the nose. It is slightly raised.        Assessment & Plan:  Hypertensive heart disease without CHF  Diabetes mellitus type 2, noninsulin dependent  Hyperlipidemia  Arthritis  Nasal lesion  Sleep apnea  History of colonic polyps - Tubular adenoma  Go out for 5 minute walks but do that several times per day.also recommend she come back for lesion excision from her nose. She is to continue to use his CPAP machine. She will continue to be followed by orthopedics.  Curvature to continue with present medication regimen and weight loss.

## 2012-09-06 ENCOUNTER — Telehealth: Payer: Self-pay | Admitting: Family Medicine

## 2012-09-06 NOTE — Telephone Encounter (Signed)
LM

## 2012-09-14 ENCOUNTER — Ambulatory Visit (INDEPENDENT_AMBULATORY_CARE_PROVIDER_SITE_OTHER): Payer: BC Managed Care – PPO | Admitting: Family Medicine

## 2012-09-14 VITALS — Wt 261.0 lb

## 2012-09-14 DIAGNOSIS — L989 Disorder of the skin and subcutaneous tissue, unspecified: Secondary | ICD-10-CM

## 2012-09-14 NOTE — Progress Notes (Signed)
  Subjective:    Patient ID: Sandra Lawrence, female    DOB: 22-Oct-1945, 67 y.o.   MRN: [id number]  HPI She is here for evaluation of a 0.4 cm lesion on the right side of the nose near the tip.   Review of Systems     Objective:   Physical Exam The lesion is 0.4 cm. And in the above-mentioned area.       Assessment & Plan:  External nasal lesion lesion was injected with Xylocaine and epinephrine. A shave excision was done and the base was cauterized with silver nitrate. He was sent for pathologic evaluation.

## 2012-09-20 ENCOUNTER — Telehealth: Payer: Self-pay

## 2012-09-20 NOTE — Telephone Encounter (Signed)
Pt informed biopsy is normal pt verbalized understanding

## 2012-09-21 ENCOUNTER — Encounter: Payer: Self-pay | Admitting: Family Medicine

## 2012-10-19 ENCOUNTER — Telehealth: Payer: Self-pay | Admitting: Family Medicine

## 2012-10-20 NOTE — Telephone Encounter (Signed)
LM

## 2012-11-14 ENCOUNTER — Telehealth: Payer: Self-pay | Admitting: Family Medicine

## 2012-11-14 MED ORDER — TRAMADOL HCL 50 MG PO TABS: 100.0000 mg | ORAL_TABLET | Freq: Four times a day (QID) | ORAL | Status: DC | PRN

## 2012-11-14 NOTE — Telephone Encounter (Signed)
Okay to renew

## 2012-11-14 NOTE — Telephone Encounter (Signed)
SENT TRAMADOL IN PER JCL

## 2013-01-31 ENCOUNTER — Encounter: Payer: Self-pay | Admitting: Family Medicine

## 2013-01-31 ENCOUNTER — Telehealth: Payer: Self-pay | Admitting: Family Medicine

## 2013-01-31 NOTE — Telephone Encounter (Signed)
Pt walked into the office and requested a letter showing her medical conditions and that she uses a CPAP at night.  She is taking this letter to [employer] and they will help her with her [employer].

## 2013-02-15 ENCOUNTER — Encounter (HOSPITAL_COMMUNITY): Payer: Self-pay | Admitting: Adult Health

## 2013-02-15 ENCOUNTER — Emergency Department (HOSPITAL_COMMUNITY)
Admission: EM | Admit: 2013-02-15 | Discharge: 2013-02-15 | Disposition: A | Discharge status: Home / Self Care | Payer: BC Managed Care – PPO | Attending: Emergency Medicine | Admitting: Emergency Medicine

## 2013-02-15 DIAGNOSIS — Z87891 Personal history of nicotine dependence: Secondary | ICD-10-CM | POA: Insufficient documentation

## 2013-02-15 DIAGNOSIS — Z8739 Personal history of other diseases of the musculoskeletal system and connective tissue: Secondary | ICD-10-CM | POA: Insufficient documentation

## 2013-02-15 DIAGNOSIS — R011 Cardiac murmur, unspecified: Secondary | ICD-10-CM | POA: Insufficient documentation

## 2013-02-15 DIAGNOSIS — E119 Type 2 diabetes mellitus without complications: Secondary | ICD-10-CM | POA: Insufficient documentation

## 2013-02-15 DIAGNOSIS — I2510 Atherosclerotic heart disease of native coronary artery without angina pectoris: Secondary | ICD-10-CM | POA: Insufficient documentation

## 2013-02-15 DIAGNOSIS — F439 Reaction to severe stress, unspecified: Secondary | ICD-10-CM

## 2013-02-15 DIAGNOSIS — Z79899 Other long term (current) drug therapy: Secondary | ICD-10-CM | POA: Insufficient documentation

## 2013-02-15 DIAGNOSIS — E7800 Pure hypercholesterolemia, unspecified: Secondary | ICD-10-CM | POA: Insufficient documentation

## 2013-02-15 DIAGNOSIS — Z7982 Long term (current) use of aspirin: Secondary | ICD-10-CM | POA: Insufficient documentation

## 2013-02-15 DIAGNOSIS — J45909 Unspecified asthma, uncomplicated: Secondary | ICD-10-CM | POA: Insufficient documentation

## 2013-02-15 DIAGNOSIS — Z0000 Encounter for general adult medical examination without abnormal findings: Secondary | ICD-10-CM

## 2013-02-15 DIAGNOSIS — E669 Obesity, unspecified: Secondary | ICD-10-CM | POA: Insufficient documentation

## 2013-02-15 DIAGNOSIS — F329 Major depressive disorder, single episode, unspecified: Secondary | ICD-10-CM | POA: Insufficient documentation

## 2013-02-15 DIAGNOSIS — Z8719 Personal history of other diseases of the digestive system: Secondary | ICD-10-CM | POA: Insufficient documentation

## 2013-02-15 DIAGNOSIS — F430 Acute stress reaction: Secondary | ICD-10-CM | POA: Insufficient documentation

## 2013-02-15 DIAGNOSIS — Z872 Personal history of diseases of the skin and subcutaneous tissue: Secondary | ICD-10-CM | POA: Insufficient documentation

## 2013-02-15 DIAGNOSIS — F419 Anxiety disorder, unspecified: Secondary | ICD-10-CM

## 2013-02-15 DIAGNOSIS — E785 Hyperlipidemia, unspecified: Secondary | ICD-10-CM | POA: Insufficient documentation

## 2013-02-15 DIAGNOSIS — I1310 Hypertensive heart and chronic kidney disease without heart failure, with stage 1 through stage 4 chronic kidney disease, or unspecified chronic kidney disease: Secondary | ICD-10-CM | POA: Insufficient documentation

## 2013-02-15 DIAGNOSIS — N189 Chronic kidney disease, unspecified: Secondary | ICD-10-CM | POA: Insufficient documentation

## 2013-02-15 DIAGNOSIS — G4730 Sleep apnea, unspecified: Secondary | ICD-10-CM | POA: Insufficient documentation

## 2013-02-15 DIAGNOSIS — F3289 Other specified depressive episodes: Secondary | ICD-10-CM | POA: Insufficient documentation

## 2013-02-15 DIAGNOSIS — F411 Generalized anxiety disorder: Secondary | ICD-10-CM | POA: Insufficient documentation

## 2013-02-15 MED ORDER — LORAZEPAM 1 MG PO TABS: 1.0000 mg | ORAL_TABLET | Freq: Three times a day (TID) | ORAL | Status: DC | PRN
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
## 2013-02-16 NOTE — ED Provider Notes (Signed)
Medical screening examination/treatment/procedure(s) were performed by non-physician practitioner and as supervising physician I was immediately available for consultation/collaboration.    Candyce Churn, MD 02/16/13 [phone]

## 2013-02-20 ENCOUNTER — Telehealth: Payer: Self-pay | Admitting: Family Medicine

## 2013-02-20 NOTE — Telephone Encounter (Signed)
Phoned pt as she needs follow up to her ER visit per [facility].  Reached Presenter, broadcasting.

## 2013-02-22 ENCOUNTER — Encounter: Payer: Self-pay | Admitting: Family Medicine

## 2013-02-22 ENCOUNTER — Ambulatory Visit (INDEPENDENT_AMBULATORY_CARE_PROVIDER_SITE_OTHER): Payer: BC Managed Care – PPO | Admitting: Family Medicine

## 2013-02-22 VITALS — BP 130/90 | HR 96 | Wt 264.0 lb

## 2013-02-22 DIAGNOSIS — G4730 Sleep apnea, unspecified: Secondary | ICD-10-CM

## 2013-02-22 DIAGNOSIS — Z6841 Body Mass Index (BMI) 40.0 and over, adult: Secondary | ICD-10-CM

## 2013-02-22 DIAGNOSIS — Z23 Encounter for immunization: Secondary | ICD-10-CM
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
## 2013-03-14 ENCOUNTER — Encounter: Payer: Self-pay | Admitting: Family Medicine

## 2013-03-14 ENCOUNTER — Ambulatory Visit (INDEPENDENT_AMBULATORY_CARE_PROVIDER_SITE_OTHER): Payer: BC Managed Care – PPO | Admitting: Family Medicine

## 2013-03-14 VITALS — BP 146/92 | HR 76 | Wt 264.0 lb

## 2013-03-14 DIAGNOSIS — Z6841 Body Mass Index (BMI) 40.0 and over, adult: Secondary | ICD-10-CM

## 2013-03-14 DIAGNOSIS — M1990 Unspecified osteoarthritis, unspecified site: Secondary | ICD-10-CM

## 2013-03-14 DIAGNOSIS — G8929 Other chronic pain: Secondary | ICD-10-CM

## 2013-03-14 DIAGNOSIS — M549 Dorsalgia, unspecified: Secondary | ICD-10-CM

## 2013-03-14 DIAGNOSIS — M129 Arthropathy, unspecified: Secondary | ICD-10-CM

## 2013-03-14 MED ORDER — ALBUTEROL SULFATE HFA 108 (90 BASE) MCG/ACT IN AERS: 2.0000 | INHALATION_SPRAY | Freq: Four times a day (QID) | RESPIRATORY_TRACT | Status: DC | PRN

## 2013-03-14 MED ORDER — TRAMADOL HCL 50 MG PO TABS: 50.0000 mg | ORAL_TABLET | Freq: Four times a day (QID) | ORAL | Status: DC | PRN

## 2013-03-14 NOTE — Patient Instructions (Addendum)
Take 2 Aleve twice per day and use tramadol as needed for the pain. Do this through the rest of the week. Take 2 tramadol at one time if you need to

## 2013-03-14 NOTE — Progress Notes (Signed)
  Subjective:    Patient ID: Sandra Lawrence, female    DOB: [birth year], 67 y.o.   MRN: [id number]  HPI She is here for consult. She complains of a one-week history of low back pain however she has a long history of difficulty with back pain and has used tramadol in the past for this. She states tramadol and naproxen have not helped with this however further evaluation indicates she is taken small doses of this.  Review of Systems     Objective:   Physical Exam Alert and splinting on getting out of the chair. No tenderness to palpation of the back. She had difficulty with forward flexion. Good hip motion. Straight leg raising is questionably positive at at roughly 50 with negative sciatic stretch. Reflexes were difficult to do.       Assessment & Plan:  Morbid obesity with BMI of 40.0-44.9, adult  Arthritis  Chronic back pain  I encouraged her to use the tramadol and Aleve on a regular basis. Do this for the next week and if no grooming, call.

## 2013-03-15 ENCOUNTER — Telehealth: Payer: Self-pay | Admitting: Family Medicine

## 2013-03-15 NOTE — Telephone Encounter (Signed)
Please call   Her  L leg has now gone numb, needs to know what to do

## 2013-03-15 NOTE — Telephone Encounter (Signed)
She complains of numbness in the leg but is able to walk. I recommended that she take the medications through the weekend and call Monday if still having difficulty

## 2013-03-19 ENCOUNTER — Other Ambulatory Visit: Payer: Self-pay

## 2013-03-19 ENCOUNTER — Ambulatory Visit (INDEPENDENT_AMBULATORY_CARE_PROVIDER_SITE_OTHER): Payer: BC Managed Care – PPO | Admitting: Family Medicine

## 2013-03-19 ENCOUNTER — Ambulatory Visit
Admission: RE | Admit: 2013-03-19 | Discharge: 2013-03-19 | Disposition: A | Discharge status: Home / Self Care | Payer: BC Managed Care – PPO | Source: Ambulatory Visit | Attending: Family Medicine | Admitting: Family Medicine

## 2013-03-19 ENCOUNTER — Telehealth: Payer: Self-pay

## 2013-03-19 VITALS — BP 150/90 | HR 76

## 2013-03-19 DIAGNOSIS — M25552 Pain in left hip: Secondary | ICD-10-CM

## 2013-03-19 DIAGNOSIS — M25559 Pain in unspecified hip: Secondary | ICD-10-CM

## 2013-03-19 MED ORDER — TRAMADOL HCL 50 MG PO TABS: 50.0000 mg | ORAL_TABLET | ORAL | Status: DC | PRN

## 2013-03-19 NOTE — Telephone Encounter (Signed)
Call in the tramadol, give 30 pills one every 4 hours when necessary pain

## 2013-03-19 NOTE — Telephone Encounter (Signed)
CALLED IN TRAMADOL PER JCL 
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
## 2013-03-19 NOTE — Progress Notes (Signed)
Quick Note:  PT INFORMED SHE IS GOING TO CALL ORTHO IN [place] ______

## 2013-03-19 NOTE — Progress Notes (Signed)
  Subjective:    Patient ID: Sandra Lawrence, female    DOB: 10/07/1945, 67 y.o.   MRN: [id number]  HPI She is here for evaluation of continued difficulty with left hip pain. She has tried NSAID without relief. Is difficult to get a good history from her.   Review of Systems     Objective:   Physical Exam Good hip motion although she does state that she has some difficulty with this. No tenderness palpation of the greater trochanter or thigh. Negative straight leg raising. The x-ray was negative.      Assessment & Plan:  Left hip pain - Plan: DG Hip Complete Left  I will refer her to orthopedics. She is very difficult to get a good history from.

## 2013-03-23 ENCOUNTER — Telehealth: Payer: Self-pay | Admitting: Family Medicine

## 2013-03-23 NOTE — Telephone Encounter (Signed)
Can't call out anything stronger.   Can take 50mg  Tramadol, 1-2 tablets at a time, up to TID.   Make sure we have referred to ortho (per Dr. Jola Babinski message).

## 2013-03-23 NOTE — Telephone Encounter (Signed)
Sandra Lawrence,  Can you look at this? Not sure if Dr Susann Givens will be looking at his messages

## 2013-03-23 NOTE — Telephone Encounter (Signed)
Pt has an appt Monday with orthopedics pt states and then pt was told to take tramadol 1-2 at a time for pain

## 2013-04-13 ENCOUNTER — Other Ambulatory Visit: Payer: Self-pay | Admitting: Family Medicine

## 2013-04-16 NOTE — Telephone Encounter (Signed)
IS THIS OKAY 

## 2013-04-16 NOTE — Telephone Encounter (Signed)
She was given 60 with 1 refill and should not need one now. Please check on that

## 2013-04-17 ENCOUNTER — Telehealth: Payer: Self-pay | Admitting: Family Medicine

## 2013-04-17 NOTE — Telephone Encounter (Signed)
Pt is going to a specialist in [facility] concerning her hip. She dropped off a parking placard to be filled out. Sending back in your folder. Call pt is ready.

## 2013-04-19 NOTE — Telephone Encounter (Signed)
Pt was called and pt was informed that Dr. Susann Givens was not going to fill out the parking placard. Pt states that she will come by and pick up parking placard and have her ortho. Doc fill out. Form was placed in file for pt to pick up.

## 2013-05-30 ENCOUNTER — Other Ambulatory Visit: Payer: Self-pay | Admitting: Family Medicine

## 2013-05-30 NOTE — Telephone Encounter (Signed)
Is this okay to refill? 

## 2013-05-31 NOTE — Telephone Encounter (Signed)
Okay to call in. 

## 2013-05-31 NOTE — Telephone Encounter (Signed)
Rx called into pharm 

## 2013-06-25 ENCOUNTER — Other Ambulatory Visit: Payer: Self-pay

## 2013-06-25 DIAGNOSIS — Z1231 Encounter for screening mammogram for malignant neoplasm of breast: Secondary | ICD-10-CM

## 2013-07-02 ENCOUNTER — Other Ambulatory Visit: Payer: Self-pay | Admitting: Family Medicine

## 2013-07-02 NOTE — Telephone Encounter (Signed)
Is this okay to refill? 

## 2013-07-03 ENCOUNTER — Telehealth: Payer: Self-pay | Admitting: Family Medicine

## 2013-07-03 NOTE — Telephone Encounter (Signed)
Pt needs refill on tramadol call when ready

## 2013-07-21 ENCOUNTER — Observation Stay (HOSPITAL_COMMUNITY)
Admission: EM | Admit: 2013-07-21 | Discharge: 2013-07-23 | Disposition: A | Discharge status: Home / Self Care | Payer: Medicare HMO | Attending: Internal Medicine | Admitting: Internal Medicine

## 2013-07-21 ENCOUNTER — Emergency Department (HOSPITAL_COMMUNITY): Payer: Medicare HMO

## 2013-07-21 ENCOUNTER — Encounter (HOSPITAL_COMMUNITY): Payer: Self-pay | Admitting: Emergency Medicine

## 2013-07-21 DIAGNOSIS — Z7982 Long term (current) use of aspirin: Secondary | ICD-10-CM | POA: Insufficient documentation

## 2013-07-21 DIAGNOSIS — R000 Tachycardia, unspecified: Secondary | ICD-10-CM

## 2013-07-21 DIAGNOSIS — IMO0002 Reserved for concepts with insufficient information to code with codable children: Secondary | ICD-10-CM | POA: Insufficient documentation

## 2013-07-21 DIAGNOSIS — E119 Type 2 diabetes mellitus without complications: Secondary | ICD-10-CM

## 2013-07-21 DIAGNOSIS — R0789 Other chest pain: Principal | ICD-10-CM | POA: Insufficient documentation

## 2013-07-21 DIAGNOSIS — Z79899 Other long term (current) drug therapy: Secondary | ICD-10-CM | POA: Insufficient documentation

## 2013-07-21 DIAGNOSIS — R079 Chest pain, unspecified: Secondary | ICD-10-CM

## 2013-07-21 DIAGNOSIS — I2510 Atherosclerotic heart disease of native coronary artery without angina pectoris: Secondary | ICD-10-CM

## 2013-07-21 DIAGNOSIS — J45901 Unspecified asthma with (acute) exacerbation: Secondary | ICD-10-CM | POA: Insufficient documentation

## 2013-07-21 DIAGNOSIS — R52 Pain, unspecified: Secondary | ICD-10-CM | POA: Insufficient documentation

## 2013-07-21 DIAGNOSIS — R9431 Abnormal electrocardiogram [ECG] [EKG]: Secondary | ICD-10-CM

## 2013-07-21 DIAGNOSIS — Z860100 Personal history of colon polyps, unspecified: Secondary | ICD-10-CM

## 2013-07-21 DIAGNOSIS — G4730 Sleep apnea, unspecified: Secondary | ICD-10-CM

## 2013-07-21 DIAGNOSIS — K219 Gastro-esophageal reflux disease without esophagitis: Secondary | ICD-10-CM | POA: Insufficient documentation

## 2013-07-21 DIAGNOSIS — L74519 Primary focal hyperhidrosis, unspecified: Secondary | ICD-10-CM | POA: Insufficient documentation

## 2013-07-21 DIAGNOSIS — M1710 Unilateral primary osteoarthritis, unspecified knee: Secondary | ICD-10-CM | POA: Insufficient documentation

## 2013-07-21 DIAGNOSIS — E7800 Pure hypercholesterolemia, unspecified: Secondary | ICD-10-CM | POA: Insufficient documentation

## 2013-07-21 DIAGNOSIS — E785 Hyperlipidemia, unspecified: Secondary | ICD-10-CM

## 2013-07-21 DIAGNOSIS — F411 Generalized anxiety disorder: Secondary | ICD-10-CM | POA: Insufficient documentation

## 2013-07-21 DIAGNOSIS — Z87442 Personal history of urinary calculi: Secondary | ICD-10-CM | POA: Insufficient documentation

## 2013-07-21 DIAGNOSIS — R011 Cardiac murmur, unspecified: Secondary | ICD-10-CM | POA: Insufficient documentation

## 2013-07-21 DIAGNOSIS — E1169 Type 2 diabetes mellitus with other specified complication: Secondary | ICD-10-CM | POA: Diagnosis present

## 2013-07-21 DIAGNOSIS — I739 Peripheral vascular disease, unspecified: Secondary | ICD-10-CM | POA: Insufficient documentation

## 2013-07-21 DIAGNOSIS — Z9981 Dependence on supplemental oxygen: Secondary | ICD-10-CM | POA: Insufficient documentation

## 2013-07-21 DIAGNOSIS — Z87891 Personal history of nicotine dependence: Secondary | ICD-10-CM | POA: Insufficient documentation

## 2013-07-21 DIAGNOSIS — Z6841 Body Mass Index (BMI) 40.0 and over, adult: Secondary | ICD-10-CM

## 2013-07-21 DIAGNOSIS — M810 Age-related osteoporosis without current pathological fracture: Secondary | ICD-10-CM | POA: Insufficient documentation

## 2013-07-21 DIAGNOSIS — I119 Hypertensive heart disease without heart failure: Secondary | ICD-10-CM

## 2013-07-21 DIAGNOSIS — E669 Obesity, unspecified: Secondary | ICD-10-CM | POA: Insufficient documentation

## 2013-07-21 DIAGNOSIS — M1990 Unspecified osteoarthritis, unspecified site: Secondary | ICD-10-CM

## 2013-07-21 LAB — CBC
HCT: 36.8 % (ref 36.0–46.0)
HEMATOCRIT: 37.5 % (ref 36.0–46.0)
HEMOGLOBIN: 12.4 g/dL (ref 12.0–15.0)
HEMOGLOBIN: 12.4 g/dL (ref 12.0–15.0)
MCH: 28.4 pg (ref 26.0–34.0)
MCH: 28.8 pg (ref 26.0–34.0)
MCHC: 33.1 g/dL (ref 30.0–36.0)
MCHC: 33.7 g/dL (ref 30.0–36.0)
MCV: 85.8 fL (ref 78.0–100.0)
MCV: 85.4 fL (ref 78.0–100.0)
Platelets: 260 10*3/uL (ref 150–400)
Platelets: 253 10*3/uL (ref 150–400)
RBC: 4.37 MIL/uL (ref 3.87–5.11)
RBC: 4.31 MIL/uL (ref 3.87–5.11)
RDW: 15 % (ref 11.5–15.5)
RDW: 14.8 % (ref 11.5–15.5)
WBC: 5.9 10*3/uL (ref 4.0–10.5)
WBC: 6.1 10*3/uL (ref 4.0–10.5)

## 2013-07-21 LAB — BASIC METABOLIC PANEL
BUN: 10 mg/dL (ref 6–23)
CO2: 24 mEq/L (ref 19–32)
CREATININE: 0.5 mg/dL (ref 0.50–1.10)
Calcium: 9 mg/dL (ref 8.4–10.5)
Chloride: 102 mEq/L (ref 96–112)
GFR calc Af Amer: >90 mL/min (ref >90)
GFR calc non Af Amer: >90 mL/min (ref >90)
GLUCOSE: 113 mg/dL — AB (ref 70–99)
POTASSIUM: 3.4 meq/L — AB (ref 3.7–5.3)
Sodium: 138 mEq/L (ref 137–147)

## 2013-07-21 LAB — GLUCOSE, CAPILLARY
GLUCOSE-CAPILLARY: 103 mg/dL — AB (ref 70–99)
Glucose-Capillary: 134 mg/dL — ABNORMAL HIGH (ref 70–99)
Glucose-Capillary: 108 mg/dL — ABNORMAL HIGH (ref 70–99)

## 2013-07-21 LAB — I-STAT TROPONIN, ED: Troponin i, poc: 0 ng/mL (ref 0.00–0.08)

## 2013-07-21 LAB — CREATININE, SERUM: Creatinine, Ser: 0.63 mg/dL (ref 0.50–1.10)

## 2013-07-21 LAB — TROPONIN I
Troponin I: <0.3 ng/mL (ref <0.30)
Troponin I: <0.3 ng/mL (ref <0.30)

## 2013-07-21 LAB — CBG MONITORING, ED: GLUCOSE-CAPILLARY: 99 mg/dL (ref 70–99)

## 2013-07-21 MED ORDER — HYDROCHLOROTHIAZIDE 12.5 MG PO CAPS
12.5000 mg | ORAL_CAPSULE | Freq: Every day | ORAL | Status: DC
  Administered 2013-07-21 – 2013-07-23 (×3): 12.5 mg via ORAL
  Filled 2013-07-21 (×3): qty 1

## 2013-07-21 MED ORDER — SODIUM CHLORIDE 0.9 % IJ SOLN: 3.0000 mL | INTRAMUSCULAR | Status: DC | PRN

## 2013-07-21 MED ORDER — ALBUTEROL SULFATE (2.5 MG/3ML) 0.083% IN NEBU: 2.5000 mg | INHALATION_SOLUTION | Freq: Four times a day (QID) | RESPIRATORY_TRACT | Status: DC | PRN

## 2013-07-21 MED ORDER — HYDROCODONE-ACETAMINOPHEN 5-325 MG PO TABS
1.0000 | ORAL_TABLET | ORAL | Status: DC | PRN
  Administered 2013-07-21: 2 via ORAL
  Administered 2013-07-21: 1 via ORAL
  Administered 2013-07-22 – 2013-07-23 (×5): 2 via ORAL
  Filled 2013-07-21 (×5): qty 2
  Filled 2013-07-21: qty 1
  Filled 2013-07-21: qty 2

## 2013-07-21 MED ORDER — ACETAMINOPHEN 325 MG PO TABS: 650.0000 mg | ORAL_TABLET | Freq: Four times a day (QID) | ORAL | Status: DC | PRN

## 2013-07-21 MED ORDER — ENOXAPARIN SODIUM 40 MG/0.4ML ~~LOC~~ SOLN
40.0000 mg | SUBCUTANEOUS | Status: DC
  Administered 2013-07-21 – 2013-07-22 (×2): 40 mg via SUBCUTANEOUS
  Filled 2013-07-21 (×3): qty 0.4

## 2013-07-21 MED ORDER — ACETAMINOPHEN 650 MG RE SUPP: 650.0000 mg | Freq: Four times a day (QID) | RECTAL | Status: DC | PRN

## 2013-07-21 MED ORDER — ALBUTEROL SULFATE HFA 108 (90 BASE) MCG/ACT IN AERS: 1.0000 | INHALATION_SPRAY | Freq: Four times a day (QID) | RESPIRATORY_TRACT | Status: DC | PRN

## 2013-07-21 MED ORDER — POTASSIUM CHLORIDE CRYS ER 20 MEQ PO TBCR
40.0000 meq | EXTENDED_RELEASE_TABLET | Freq: Once | ORAL | Status: AC
  Administered 2013-07-21: 40 meq via ORAL
  Filled 2013-07-21: qty 2

## 2013-07-21 MED ORDER — ONDANSETRON HCL 4 MG/2ML IJ SOLN
4.0000 mg | Freq: Four times a day (QID) | INTRAMUSCULAR | Status: DC | PRN
  Administered 2013-07-22: 4 mg via INTRAVENOUS

## 2013-07-21 MED ORDER — TRAMADOL HCL 50 MG PO TABS: 50.0000 mg | ORAL_TABLET | Freq: Four times a day (QID) | ORAL | Status: DC | PRN

## 2013-07-21 MED ORDER — ONDANSETRON HCL 4 MG PO TABS
4.0000 mg | ORAL_TABLET | Freq: Four times a day (QID) | ORAL | Status: DC | PRN
  Administered 2013-07-23: 4 mg via ORAL
  Filled 2013-07-21: qty 1

## 2013-07-21 MED ORDER — NITROGLYCERIN 0.4 MG SL SUBL: 0.4000 mg | SUBLINGUAL_TABLET | SUBLINGUAL | Status: DC | PRN

## 2013-07-21 MED ORDER — MORPHINE SULFATE 2 MG/ML IJ SOLN: 1.0000 mg | INTRAMUSCULAR | Status: DC | PRN

## 2013-07-21 MED ORDER — LISINOPRIL 40 MG PO TABS
40.0000 mg | ORAL_TABLET | Freq: Every day | ORAL | Status: DC
  Administered 2013-07-21 – 2013-07-23 (×3): 40 mg via ORAL
  Filled 2013-07-21 (×3): qty 1

## 2013-07-21 MED ORDER — METOPROLOL TARTRATE 12.5 MG HALF TABLET
12.5000 mg | ORAL_TABLET | Freq: Two times a day (BID) | ORAL | Status: DC
  Administered 2013-07-21 – 2013-07-23 (×5): 12.5 mg via ORAL
  Filled 2013-07-21 (×6): qty 1

## 2013-07-21 MED ORDER — LORAZEPAM 1 MG PO TABS
1.0000 mg | ORAL_TABLET | Freq: Three times a day (TID) | ORAL | Status: DC | PRN
  Administered 2013-07-22: 1 mg via ORAL
  Filled 2013-07-21: qty 1

## 2013-07-21 MED ORDER — INSULIN ASPART 100 UNIT/ML ~~LOC~~ SOLN: 0.0000 [IU] | Freq: Three times a day (TID) | SUBCUTANEOUS | Status: DC

## 2013-07-21 MED ORDER — SODIUM CHLORIDE 0.9 % IJ SOLN
3.0000 mL | Freq: Two times a day (BID) | INTRAMUSCULAR | Status: DC
  Administered 2013-07-21 – 2013-07-22 (×4): 3 mL via INTRAVENOUS

## 2013-07-21 MED ORDER — SODIUM CHLORIDE 0.9 % IV SOLN: 250.0000 mL | INTRAVENOUS | Status: DC | PRN

## 2013-07-21 MED ORDER — SIMVASTATIN 10 MG PO TABS
10.0000 mg | ORAL_TABLET | Freq: Every day | ORAL | Status: DC
Start: 2013-07-21 — End: 2013-07-22
  Administered 2013-07-21 – 2013-07-22 (×2): 10 mg via ORAL
  Filled 2013-07-21 (×2): qty 1

## 2013-07-21 MED ORDER — SODIUM CHLORIDE 0.9 % IJ SOLN
3.0000 mL | Freq: Two times a day (BID) | INTRAMUSCULAR | Status: DC
  Administered 2013-07-21: 3 mL via INTRAVENOUS

## 2013-07-21 MED ORDER — FLUTICASONE PROPIONATE HFA 44 MCG/ACT IN AERO
1.0000 | INHALATION_SPRAY | Freq: Two times a day (BID) | RESPIRATORY_TRACT | Status: DC
  Administered 2013-07-21 – 2013-07-23 (×4): 1 via RESPIRATORY_TRACT
  Filled 2013-07-21: qty 10.6

## 2013-07-21 MED ORDER — ASPIRIN EC 325 MG PO TBEC
325.0000 mg | DELAYED_RELEASE_TABLET | Freq: Every day | ORAL | Status: DC
  Administered 2013-07-22 – 2013-07-23 (×2): 325 mg via ORAL
  Filled 2013-07-21 (×3): qty 1

## 2013-07-21 MED ORDER — HYDRALAZINE HCL 20 MG/ML IJ SOLN: 10.0000 mg | Freq: Three times a day (TID) | INTRAMUSCULAR | Status: DC | PRN

## 2013-07-21 NOTE — ED Provider Notes (Signed)
Medical screening examination/treatment/procedure(s) were conducted as a shared visit with non-physician practitioner(s) or resident and myself. I personally evaluated the patient during the encounter and agree with the findings and plan unless otherwise indicated.  I have personally reviewed any xrays and/ or EKG's with the provider and I agree with interpretation.  CAD, asthma, HTN, Lipids presents with CP. Patient had anterior chest ache/ palpitations, non radiating this am lasting approx 30-40 minutes around 7 am. Pt had a few brief chest aches the past two weeks, non exertional. Mild lightheaded. Pt had similar episode years ago, last stress 2009 in records. Pt had sxs improve with nitro and asa.  Pt did have diaphoresis with sxs. No recent surgery/ blood clot hx/ leg swelling or leg pain.  EKG new T wave/ ST depression lateral and inferior, concern for ischemia.  Exam lungs clear, RRR, no leg swelling, no diaphoresis, non toxic appearing, mild dry mm, abdo soft/ NT.  Plan for tele obs or admission.  Last NM study 2009 reviewed.  IMPRESSION:  1. Probable nonuniform soft tissue attenuation results in mild  decreased radiotracer uptake within the apical segment of the  anterior wall on the stress images.  2. Left ventricular ejection fraction equals 62%.  3. Essentially normal left ventricular systolic function.  Acute chest pain, CAD, EKG abnormality   Mariea Clonts, MD 07/21/13 [phone]

## 2013-07-21 NOTE — ED Provider Notes (Signed)
CSN: [id number]     Arrival date & time 07/21/13  6962 History   First MD Initiated Contact with Patient 07/21/13 [phone]     Chief Complaint  Patient presents with  . Chest Pain     (Consider location/radiation/quality/duration/timing/severity/associated sxs/prior Treatment) HPI Comments: Pt is a 68 y/o obese female with a PMHx of HTN, PVC, asthma, CAD, hyperlipidemia, anxiety, depression, DM, GERD and CKD who presents to the ED via EMS complaining of gradual onset midsternal chest pain beginning about 40 minutes prior to arrival. Patient states she woke up from sleep around 7:00 AM today, had a sensation of her heart racing and gradually developed substernal chest pain, nonradiating rated 7/10. Admits to associated sob. Denies nausea. EMS arrived and patient heart rate of 150, she was given 324 mg aspirin and 2 sublingual nitroglycerin with complete relief of chest pain. EMS reports the patient was slightly diaphoretic on arrival. Pt states over the past week she had an intermittent dull ache in the same area lasting only a few seconds at a time. ST depression inferiorly noted on EMS EKG strip. Pt has not seen her cardiologist Dr. Tamala Julian for about 4 years, which is when she had a negative cardiac cath.  The history is provided by the patient and the EMS personnel.    Past Medical History  Diagnosis Date  . Hypertensive heart disease without congestive heart failure   . Obesity   . Degenerative joint disease     BOTH KNEES  . PVC (premature ventricular contraction)   . Palpitations   . Asthma   . Hypercholesteremia   . Fatty tumor   . CAD (coronary artery disease) 02/15/2007  . Hyperlipidemia 04/17/2011  . Anxiety   . Allergy   . Sleep apnea     wears CPAP  . Blood transfusion without reported diagnosis   . Depression   . Diabetes mellitus     no medicines  . Diabetes mellitus type 2, noninsulin dependent   . GERD (gastroesophageal reflux disease)   . Heart murmur   . Hypertension    . Chronic kidney disease     kidney stones  . Osteoporosis   . Bursitis     right hip   Past Surgical History  Procedure Laterality Date  . Hemorroidectomy    . Cesarean section    . Replacement total knee  left knee   Family History  Problem Relation Age of Onset  . Cancer Father   . Diabetes Mother   . Colon cancer Neg Hx   . Esophageal cancer Neg Hx   . Stomach cancer Neg Hx   . Rectal cancer Neg Hx    History  Substance Use Topics  . Smoking status: Former Smoker    Quit date: 11/10/1979  . Smokeless tobacco: Never Used  . Alcohol Use: Yes     Comment: occasionally   OB History   Grav Para Term Preterm Abortions TAB SAB Ect Mult Living                 Review of Systems  Constitutional: Positive for diaphoresis.  Respiratory: Positive for shortness of breath.   Cardiovascular: Positive for chest pain.  All other systems reviewed and are negative.      Allergies  Review of patient's allergies indicates no known allergies.  Home Medications   Current Outpatient Rx  Name  Route  Sig  Dispense  Refill  . albuterol (PROVENTIL HFA;VENTOLIN HFA) 108 (90 BASE) MCG/ACT inhaler  Inhalation   Inhale 1-2 puffs into the lungs every 6 (six) hours as needed for wheezing or shortness of breath.         Marland Kitchen aspirin EC 325 MG tablet   Oral   Take 325 mg by mouth daily.           . Aspirin-Salicylamide-Caffeine (BC HEADACHE POWDER PO)   Oral   Take 1 packet by mouth daily as needed (for pain).         Marland Kitchen beclomethasone (QVAR) 80 MCG/ACT inhaler   Inhalation   Inhale 2 puffs into the lungs 2 (two) times daily as needed (for shortness of breath).         . hydrochlorothiazide (MICROZIDE) 12.5 MG capsule   Oral   Take 12.5 mg by mouth daily.          Marland Kitchen lisinopril (PRINIVIL,ZESTRIL) 40 MG tablet   Oral   Take 40 mg by mouth daily.          Marland Kitchen LORazepam (ATIVAN) 1 MG tablet   Oral   Take 1 mg by mouth every 8 (eight) hours as needed for anxiety.          . naproxen sodium (ANAPROX) 220 MG tablet   Oral   Take 440 mg by mouth 2 (two) times daily as needed (for pain).         . nitroGLYCERIN (NITROSTAT) 0.4 MG SL tablet   Sublingual   Place 0.4 mg under the tongue every 5 (five) minutes as needed for chest pain.          Marland Kitchen orlistat (XENICAL) 120 MG capsule   Oral   Take 120 mg by mouth 3 (three) times daily with meals.         . pravastatin (PRAVACHOL) 40 MG tablet   Oral   Take 40 mg by mouth at bedtime.         . traMADol (ULTRAM) 50 MG tablet   Oral   Take 50 mg by mouth every 6 (six) hours as needed for moderate pain.          BP 168/92  Pulse 80  Temp(Src) 98.2 F (36.8 C) (Oral)  Resp 18  SpO2 97% Physical Exam  Nursing note and vitals reviewed. Constitutional: She is oriented to person, place, and time. She appears well-developed and well-nourished. No distress.  HENT:  Head: Normocephalic and atraumatic.  Mouth/Throat: Oropharynx is clear and moist.  Eyes: Conjunctivae and EOM are normal. Pupils are equal, round, and reactive to light.  Neck: Normal range of motion. Neck supple. No JVD present.  Cardiovascular: Normal rate, regular rhythm, normal heart sounds and intact distal pulses.   No extremity edema. Equal distal pulses.  Pulmonary/Chest: Effort normal and breath sounds normal. She exhibits no tenderness.  Abdominal: Soft. Bowel sounds are normal. There is no tenderness.  Musculoskeletal: Normal range of motion. She exhibits no edema.  Neurological: She is alert and oriented to person, place, and time. She has normal strength. No sensory deficit.  Moves limbs without ataxia. Speech fluent, goal oriented. Equal grip strength.  Skin: Skin is warm and dry. She is not diaphoretic.  Psychiatric: She has a normal mood and affect. Her behavior is normal.    ED Course  Procedures (including critical care time) Labs Review Labs Reviewed  BASIC METABOLIC PANEL - Abnormal; Notable for the  following:    Potassium 3.4 (*)    Glucose, Bld 113 (*)    All other components  within normal limits  CBC  I-STAT TROPOININ, ED  CBG MONITORING, ED   Imaging Review Dg Chest 2 View  07/21/2013   CLINICAL DATA:  Chest pain. Tachycardia. Diabetes. coronary artery disease.  EXAM: CHEST  2 VIEW  COMPARISON:  DG CHEST 2 VIEW dated 04/17/2011; DG CHEST 2 VIEW dated 05/08/2010  FINDINGS: Cardiothoracic index 52%, compatible with mild cardiomegaly. Thoracic spondylosis noted. The lungs appear clear. No edema. No pleural effusion.  IMPRESSION: 1. Mild cardiomegaly. 2. Thoracic spondylosis.   Electronically Signed   By: Sherryl Barters M.D.   On: 07/21/2013 08:29     EKG Interpretation   Date/Time:  Saturday July 21 2013 07:47:16 EST Ventricular Rate:  86 PR Interval:  154 QRS Duration: 91 QT Interval:  357 QTC Calculation: 427 R Axis:   54 Text Interpretation:  Sinus rhythm Consider left atrial enlargement  Inferior and lateral T wave changes  and ST depression Confirmed by ZAVITZ   MD, JOSHUA ([id number]) on 07/21/2013 7:59:23 AM      MDM   Final diagnoses:  Acute chest pain  CAD (coronary artery disease)  Diabetes mellitus type 2, noninsulin dependent  Abnormal EKG    Pt presenting with CP, resolved with ASA and 2 SL nitro. She appears in NAD, VSS. ST depression noted inferiorly and laterally, changed from prior EKG. Currently CP free. Labs pending. Plan to admit patient. Case discussed with attending Dr. Reather Converse who also evaluated patient and agrees with plan of care. 11:01 AM Labs without acute finding. Troponin negative. Chest x-ray showing mild cardiomegaly, otherwise no acute findings. Repeat EKG unchanged. I spoke with Dr. Sallyanne Kuster, cardiology who feels patient can be admitted to medicine with a stress test in the morning, he will consult her on admission. Patient admitted to medicine, admission accepted by Dr. Tyrell Antonio, [facility].  Illene Labrador, PA-C 07/21/13 1102

## 2013-07-21 NOTE — Consult Note (Signed)
Reason for Consult: Chest pain, tachycardia  Requesting Physician: Westley Hummer  Cardiologist: Linard Millers  HPI: This is a 68 y.o. female with a past medical history significant for multiple coronary risk factors (hypertension, diabetes mellitus, hyperlipidemia, remote smoking, current secondhand tobacco smoke exposure), but with only mildly stenosed coronary arteries at the time of angiography in 2009 (30% mid LAD, 50% ostial lesions in 3 oblique marginal branches).   She presents with complaints of chest pain and rapid palpitations. For the last couple of weeks she has had occasional 30 second episodes of palpitations with chest pressure. Early this morning she had very abrupt onset of rapid irregular palpitations associated with weakness, dyspnea, presyncope, diaphoresis and chest pressure. Reportedly her heart rate was 150 when EMS arrived, but she says that her heart rate was already settling down by that time. Symptoms are relieved after demonstration of nitroglycerin. By the time she arrived in the emergency room she was in normal sinus rhythm with a heart rate in the 80s. She is now asymptomatic.  Chronically, she is limited by knee pain and lower back pain/sciatica. She does describe occasional chest discomfort with exertion but the pattern is inconsistent. She has obstructive sleep apnea and uses CPAP. Her diabetes is controlled with diet and she does not require medications.  Her electrocardiogram shows ST segment depression in the lateral leads which is a chronic finding. PMHx:  Past Medical History  Diagnosis Date  . Hypertensive heart disease without congestive heart failure   . Obesity   . Degenerative joint disease     BOTH KNEES  . PVC (premature ventricular contraction)   . Palpitations   . Asthma   . Hypercholesteremia   . Fatty tumor   . CAD (coronary artery disease) 02/15/2007  . Hyperlipidemia 04/17/2011  . Anxiety   . Allergy   . Sleep apnea     wears CPAP  .  Blood transfusion without reported diagnosis   . Depression   . Diabetes mellitus     no medicines  . Diabetes mellitus type 2, noninsulin dependent   . GERD (gastroesophageal reflux disease)   . Heart murmur   . Hypertension   . Chronic kidney disease     kidney stones  . Osteoporosis   . Bursitis     right hip   Past Surgical History  Procedure Laterality Date  . Hemorroidectomy    . Cesarean section    . Replacement total knee  left knee    FAMHx: Family History  Problem Relation Age of Onset  . Cancer Father   . Diabetes Mother   . Colon cancer Neg Hx   . Esophageal cancer Neg Hx   . Stomach cancer Neg Hx   . Rectal cancer Neg Hx     SOCHx:  reports that she quit smoking about 33 years ago. She has never used smokeless tobacco. She reports that she drinks alcohol. She reports that she does not use illicit drugs.  ALLERGIES: No Known Allergies  ROS: The patient specifically denies orthopnea, paroxysmal nocturnal dyspnea, syncope, focal neurological deficits, intermittent claudication, lower extremity edema, unexplained weight gain, cough, hemoptysis or wheezing.  The patient also denies abdominal pain, nausea, vomiting, dysphagia, diarrhea, constipation, polyuria, polydipsia, dysuria, hematuria, frequency, urgency, abnormal bleeding or bruising, fever, chills, unexpected weight changes, mood swings, change in skin or hair texture, change in voice quality, auditory or visual problems, allergic reactions or rashes, new musculoskeletal complaints other than usual "aches and pains".  HOME MEDICATIONS: Prescriptions prior to admission  Medication Sig Dispense Refill  . albuterol (PROVENTIL HFA;VENTOLIN HFA) 108 (90 BASE) MCG/ACT inhaler Inhale 1-2 puffs into the lungs every 6 (six) hours as needed for wheezing or shortness of breath.      Marland Kitchen aspirin EC 325 MG tablet Take 325 mg by mouth daily.        . Aspirin-Salicylamide-Caffeine (BC HEADACHE POWDER PO) Take 1 packet  by mouth daily as needed (for pain).      Marland Kitchen beclomethasone (QVAR) 80 MCG/ACT inhaler Inhale 2 puffs into the lungs 2 (two) times daily as needed (for shortness of breath).      . hydrochlorothiazide (MICROZIDE) 12.5 MG capsule Take 12.5 mg by mouth daily.       Marland Kitchen lisinopril (PRINIVIL,ZESTRIL) 40 MG tablet Take 40 mg by mouth daily.       Marland Kitchen LORazepam (ATIVAN) 1 MG tablet Take 1 mg by mouth every 8 (eight) hours as needed for anxiety.      . naproxen sodium (ANAPROX) 220 MG tablet Take 440 mg by mouth 2 (two) times daily as needed (for pain).      . nitroGLYCERIN (NITROSTAT) 0.4 MG SL tablet Place 0.4 mg under the tongue every 5 (five) minutes as needed for chest pain.       Marland Kitchen orlistat (XENICAL) 120 MG capsule Take 120 mg by mouth 3 (three) times daily with meals.      . pravastatin (PRAVACHOL) 40 MG tablet Take 40 mg by mouth at bedtime.      . traMADol (ULTRAM) 50 MG tablet Take 50 mg by mouth every 6 (six) hours as needed for moderate pain.        HOSPITAL MEDICATIONS: I have reviewed the patient's current medications. Prior to Admission:  Prescriptions prior to admission  Medication Sig Dispense Refill  . albuterol (PROVENTIL HFA;VENTOLIN HFA) 108 (90 BASE) MCG/ACT inhaler Inhale 1-2 puffs into the lungs every 6 (six) hours as needed for wheezing or shortness of breath.      Marland Kitchen aspirin EC 325 MG tablet Take 325 mg by mouth daily.        . Aspirin-Salicylamide-Caffeine (BC HEADACHE POWDER PO) Take 1 packet by mouth daily as needed (for pain).      Marland Kitchen beclomethasone (QVAR) 80 MCG/ACT inhaler Inhale 2 puffs into the lungs 2 (two) times daily as needed (for shortness of breath).      . hydrochlorothiazide (MICROZIDE) 12.5 MG capsule Take 12.5 mg by mouth daily.       Marland Kitchen lisinopril (PRINIVIL,ZESTRIL) 40 MG tablet Take 40 mg by mouth daily.       Marland Kitchen LORazepam (ATIVAN) 1 MG tablet Take 1 mg by mouth every 8 (eight) hours as needed for anxiety.      . naproxen sodium (ANAPROX) 220 MG tablet Take 440 mg  by mouth 2 (two) times daily as needed (for pain).      . nitroGLYCERIN (NITROSTAT) 0.4 MG SL tablet Place 0.4 mg under the tongue every 5 (five) minutes as needed for chest pain.       Marland Kitchen orlistat (XENICAL) 120 MG capsule Take 120 mg by mouth 3 (three) times daily with meals.      . pravastatin (PRAVACHOL) 40 MG tablet Take 40 mg by mouth at bedtime.      . traMADol (ULTRAM) 50 MG tablet Take 50 mg by mouth every 6 (six) hours as needed for moderate pain.        VITALS: Blood pressure  176/79, pulse 69, temperature 98.2 F (36.8 C), temperature source Oral, resp. rate 18, height 5\' 7"  (1.702 m), weight 111.902 kg (246 lb 11.2 oz), SpO2 98.00%.  PHYSICAL EXAM: Morbidly obese General: Alert, oriented x3, no distress Head: no evidence of trauma, PERRL, EOMI, no exophtalmos or lid lag, no myxedema, no xanthelasma; normal ears, nose and oropharynx Neck: Normal jugular venous pulsations and no hepatojugular reflux; brisk carotid pulses without delay and no carotid bruits Chest: clear to auscultation, no signs of consolidation by percussion or palpation, normal fremitus, symmetrical and full respiratory excursions Cardiovascular: normal position and quality of the apical impulse, regular rhythm, normal first heart sound and normal second heart sound, no rubs or gallops, no murmur Abdomen: no tenderness or distention, no masses by palpation, no abnormal pulsatility or arterial bruits, normal bowel sounds, no hepatosplenomegaly Extremities: no clubbing, cyanosis;  no edema; 2+ radial, ulnar and brachial pulses bilaterally; 2+ right femoral, posterior tibial and dorsalis pedis pulses; 2+ left femoral, posterior tibial and dorsalis pedis pulses; no subclavian or femoral bruits Neurological: grossly nonfocal   LABS  CBC  Recent Labs  07/21/13 0755 07/21/13 1345  WBC 5.9 6.1  HGB 12.4 12.4  HCT 37.5 36.8  MCV 85.8 85.4  PLT 260 809   Basic Metabolic Panel  Recent Labs  07/21/13 0755  07/21/13 1345  NA 138  --   K 3.4*  --   CL 102  --   CO2 24  --   GLUCOSE 113*  --   BUN 10  --   CREATININE 0.50 0.63  CALCIUM 9.0  --   Cardiac Enzymes  Recent Labs  07/21/13 1345  TROPONINI <0.30    IMAGING: Dg Chest 2 View  07/21/2013   CLINICAL DATA:  Chest pain. Tachycardia. Diabetes. coronary artery disease.  EXAM: CHEST  2 VIEW  COMPARISON:  DG CHEST 2 VIEW dated 04/17/2011; DG CHEST 2 VIEW dated 05/08/2010  FINDINGS: Cardiothoracic index 52%, compatible with mild cardiomegaly. Thoracic spondylosis noted. The lungs appear clear. No edema. No pleural effusion.  IMPRESSION: 1. Mild cardiomegaly. 2. Thoracic spondylosis.   Electronically Signed   By: Sherryl Barters M.D.   On: 07/21/2013 08:29    ECG: Normal sinus rhythm, left atrial abnormality, ST segment depression with biphasic T waves in the inferior and lateral leads. These are probably secondary to left ventricular hypertrophy, with voltage masked by severe obesity. He electrocardiographic appearance is very similar to 2012.  TELEMETRY:  no arrhythmia detected so far  IMPRESSION: 1. It seems to me that the trigger for her symptoms was a rapid regular arrhythmia, most likely atrial flutter or atrial tachycardia since she believes it was very regular. The chest discomfort seems to be secondary to the palpitations. 2. She has numerous coronary risk factors and had a couple of moderate coronary stenoses by cardiac catheterization in 2008. Arrhythmia may have triggered her unstable coronary symptoms. 3. No high risk features on enzymes or ECG to suggest a true acute coronary syndrome. ECG abnormalities are chronic.  RECOMMENDATION: 1. Telemetry. If arrhythmias detected calcium channel blocker such as verapamil or diltiazem it may be preferable to beta blockers since she has reactive airway disease. If no arrhythmias detected during her hospitalization consider outpatient 30 day event monitor or even implantable loop  recorder 2. LexiScan Myoview. She is unable to walk on the treadmill. Currently without evidence of active bronchospasm. 3. No high risk features. Do not recommend intravenous heparin at this time.  Time Spent Directly  with Patient: 70 minutes  Sanda Klein, MD, [facility] [phone] office [phone] pager   07/21/2013, 5:46 PM

## 2013-07-21 NOTE — ED Notes (Signed)
Pt arrived by [facility] with c/o substernal CP and felt heart racing that started this morning after she woke up and sat up. EMS arrived pt initial HR 150. EMS administered ASA 324mg  and Nitro x 2 with relief of CP. Pt was a little diaphoretic per EMS upon arrival denies any n/v and when ambulating pt was lightheaded. Pt stated that the past week she has been having a dull ache in the same area that comes and goes for a few seconds. Last BP-160/112 HR-90 O2sat-100 RA

## 2013-07-21 NOTE — H&P (Signed)
[facility] History and Physical  Sandra Lawrence WJX:[id number] DOB: 1945/07/09 DOA: 07/21/2013  Referring physician: PA PCP: Wyatt Haste, MD   Chief Complaint: Chest pain.   HPI: Sandra Lawrence is a 68 y.o. female with PMH significant for HTN, Diabetes, hyperlipidemia, CAD who presents complaining of chest pain that started morning of admission. She relates that her heart started to beat fast, very fast. She felt chest pressure, dull chest pain in quality, associated with dyspnea. Pain was relieved by nitroglycerin and aspirin given by EMS. When EMS arrived her HR was at 150. In the ED her HR was at 89. Patient is chest pain free. She relates chest pain on exertion.  She had 2 episode last week but lasted seconds.  In the ED EKG show old T wave inversion, first set troponin negative.    Review of Systems:  Negative, except as per HPI.   Past Medical History  Diagnosis Date  . Hypertensive heart disease without congestive heart failure   . Obesity   . Degenerative joint disease     BOTH KNEES  . PVC (premature ventricular contraction)   . Palpitations   . Asthma   . Hypercholesteremia   . Fatty tumor   . CAD (coronary artery disease) 02/15/2007  . Hyperlipidemia 04/17/2011  . Anxiety   . Allergy   . Sleep apnea     wears CPAP  . Blood transfusion without reported diagnosis   . Depression   . Diabetes mellitus     no medicines  . Diabetes mellitus type 2, noninsulin dependent   . GERD (gastroesophageal reflux disease)   . Heart murmur   . Hypertension   . Chronic kidney disease     kidney stones  . Osteoporosis   . Bursitis     right hip   Past Surgical History  Procedure Laterality Date  . Hemorroidectomy    . Cesarean section    . Replacement total knee  left knee   Social History:  reports that she quit smoking about 33 years ago. She has never used smokeless tobacco. She reports that she drinks alcohol. She reports that she does not use illicit  drugs.  No Known Allergies  Family History  Problem Relation Age of Onset  . Cancer Father   . Diabetes Mother   . Colon cancer Neg Hx   . Esophageal cancer Neg Hx   . Stomach cancer Neg Hx   . Rectal cancer Neg Hx      Prior to Admission medications   Medication Sig Start Date End Date Taking? Authorizing Provider  albuterol (PROVENTIL HFA;VENTOLIN HFA) 108 (90 BASE) MCG/ACT inhaler Inhale 1-2 puffs into the lungs every 6 (six) hours as needed for wheezing or shortness of breath.   Yes Historical Provider, MD  aspirin EC 325 MG tablet Take 325 mg by mouth daily.     Yes Historical Provider, MD  Aspirin-Salicylamide-Caffeine (BC HEADACHE POWDER PO) Take 1 packet by mouth daily as needed (for pain).   Yes Historical Provider, MD  beclomethasone (QVAR) 80 MCG/ACT inhaler Inhale 2 puffs into the lungs 2 (two) times daily as needed (for shortness of breath).   Yes Historical Provider, MD  hydrochlorothiazide (MICROZIDE) 12.5 MG capsule Take 12.5 mg by mouth daily.    Yes Historical Provider, MD  lisinopril (PRINIVIL,ZESTRIL) 40 MG tablet Take 40 mg by mouth daily.    Yes Historical Provider, MD  LORazepam (ATIVAN) 1 MG tablet Take 1 mg by mouth every 8 (  eight) hours as needed for anxiety.   Yes Historical Provider, MD  naproxen sodium (ANAPROX) 220 MG tablet Take 440 mg by mouth 2 (two) times daily as needed (for pain).   Yes Historical Provider, MD  nitroGLYCERIN (NITROSTAT) 0.4 MG SL tablet Place 0.4 mg under the tongue every 5 (five) minutes as needed for chest pain.    Yes Historical Provider, MD  orlistat (XENICAL) 120 MG capsule Take 120 mg by mouth 3 (three) times daily with meals.   Yes Historical Provider, MD  pravastatin (PRAVACHOL) 40 MG tablet Take 40 mg by mouth at bedtime.   Yes Historical Provider, MD  traMADol (ULTRAM) 50 MG tablet Take 50 mg by mouth every 6 (six) hours as needed for moderate pain.   Yes Historical Provider, MD   Physical Exam: Filed Vitals:   07/21/13  1100  BP: 169/69  Pulse: 76  Temp:   Resp: 15    BP 169/69  Pulse 76  Temp(Src) 98.2 F (36.8 C) (Oral)  Resp 15  SpO2 99%  General:  Appears calm and comfortable Eyes: PERRL, normal lids, irises & conjunctiva ENT: grossly normal hearing, lips & tongue Neck: no LAD, masses or thyromegaly Cardiovascular: RRR, no m/r/g. No LE edema. Respiratory: CTA bilaterally, no w/r/r. Normal respiratory effort. Abdomen: soft, ntnd Skin: no rash or induration seen on limited exam Musculoskeletal: grossly normal tone BUE/BLE Psychiatric: grossly normal mood and affect, speech fluent and appropriate Neurologic: grossly non-focal.          Labs on Admission:  Basic Metabolic Panel:  Recent Labs Lab 07/21/13 0755  NA 138  K 3.4*  CL 102  CO2 24  GLUCOSE 113*  BUN 10  CREATININE 0.50  CALCIUM 9.0   CBC:  Recent Labs Lab 07/21/13 0755  WBC 5.9  HGB 12.4  HCT 37.5  MCV 85.8  PLT 260   Cardiac Enzymes: No results found for this basename: CKTOTAL, CKMB, CKMBINDEX, TROPONINI,  in the last 168 hours  BNP (last 3 results) No results found for this basename: PROBNP,  in the last 8760 hours CBG:  Recent Labs Lab 07/21/13 1054  GLUCAP 99    Radiological Exams on Admission: Dg Chest 2 View  07/21/2013   CLINICAL DATA:  Chest pain. Tachycardia. Diabetes. coronary artery disease.  EXAM: CHEST  2 VIEW  COMPARISON:  DG CHEST 2 VIEW dated 04/17/2011; DG CHEST 2 VIEW dated 05/08/2010  FINDINGS: Cardiothoracic index 52%, compatible with mild cardiomegaly. Thoracic spondylosis noted. The lungs appear clear. No edema. No pleural effusion.  IMPRESSION: 1. Mild cardiomegaly. 2. Thoracic spondylosis.   Electronically Signed   By: Sherryl Barters M.D.   On: 07/21/2013 08:29    EKG: Independently reviewed. Old T wave inversion lead V 5 to V 6.   Assessment/Plan Principal Problem:   Acute chest pain Active Problems:   CAD (coronary artery disease)   Hypertensive heart disease without  CHF   Diabetes mellitus type 2, noninsulin dependent   Hyperlipidemia   Sleep apnea  1-Chest pain; Admit to telemetry, cycle cardiac enzymes. Continue with nitroglycerin PRN, <orphine, lisinopril, aspirin, statin. Start metoprolol. Patient is chest pain free. Will defer start heparin to cardio. Cardiology consulted by ED. First set troponin negative. EKG with old T wave inversion.   2-Diabetes; she is on diet controlled. Will order SSI.   3-HTN; Continue with HCTZ, lisinopril. Hydralazine PRN.  4-Hyperlipidemia; Continue with statin. Check lipid panel.  5-Hypokalemia; replete with 40 meq times one.  6-Palpitation; Monitor  on telemetry. Check TSH.   Code Status: full code.  Family Communication: care discussed with patient.  Disposition Plan: expect less than 2 nights.   Time spent: 75 minutes.   Kimber Relic [facility] Pager [phone]

## 2013-07-21 NOTE — Progress Notes (Signed)
Patient arrived on the unit at 1215 on a stretcher. Alert and oriented. Had pain to her back and rt knee which is chronic. Call bell placed within reach and helped her set up for lunch. Will continue to monitor.

## 2013-07-22 ENCOUNTER — Observation Stay (HOSPITAL_COMMUNITY): Payer: Medicare HMO

## 2013-07-22 DIAGNOSIS — R079 Chest pain, unspecified: Secondary | ICD-10-CM

## 2013-07-22 DIAGNOSIS — I2510 Atherosclerotic heart disease of native coronary artery without angina pectoris: Secondary | ICD-10-CM

## 2013-07-22 DIAGNOSIS — E119 Type 2 diabetes mellitus without complications: Secondary | ICD-10-CM

## 2013-07-22 DIAGNOSIS — E785 Hyperlipidemia, unspecified: Secondary | ICD-10-CM

## 2013-07-22 LAB — BASIC METABOLIC PANEL
BUN: 18 mg/dL (ref 6–23)
CALCIUM: 9.2 mg/dL (ref 8.4–10.5)
CO2: 23 mEq/L (ref 19–32)
Chloride: 106 mEq/L (ref 96–112)
Creatinine, Ser: 0.78 mg/dL (ref 0.50–1.10)
GFR, EST NON AFRICAN AMERICAN: 86 mL/min — AB (ref >90)
GLUCOSE: 132 mg/dL — AB (ref 70–99)
Potassium: 3.7 mEq/L (ref 3.7–5.3)
SODIUM: 143 meq/L (ref 137–147)

## 2013-07-22 LAB — GLUCOSE, CAPILLARY
GLUCOSE-CAPILLARY: 99 mg/dL (ref 70–99)
GLUCOSE-CAPILLARY: 106 mg/dL — AB (ref 70–99)
GLUCOSE-CAPILLARY: 94 mg/dL (ref 70–99)
Glucose-Capillary: 113 mg/dL — ABNORMAL HIGH (ref 70–99)

## 2013-07-22 LAB — TSH: TSH: 2.415 u[IU]/mL (ref 0.350–4.500)

## 2013-07-22 LAB — TROPONIN I

## 2013-07-22 LAB — CBC
HCT: 35.5 % — ABNORMAL LOW (ref 36.0–46.0)
HEMOGLOBIN: 11.5 g/dL — AB (ref 12.0–15.0)
MCH: 27.8 pg (ref 26.0–34.0)
MCHC: 32.4 g/dL (ref 30.0–36.0)
MCV: 86 fL (ref 78.0–100.0)
PLATELETS: 249 10*3/uL (ref 150–400)
RBC: 4.13 MIL/uL (ref 3.87–5.11)
RDW: 15.1 % (ref 11.5–15.5)
WBC: 6.2 10*3/uL (ref 4.0–10.5)

## 2013-07-22 LAB — HEMOGLOBIN A1C
HEMOGLOBIN A1C: 6.3 % — AB (ref <5.7)
Mean Plasma Glucose: 134 mg/dL — ABNORMAL HIGH (ref <117)

## 2013-07-22 LAB — LIPID PANEL
CHOL/HDL RATIO: 4.7 ratio
CHOLESTEROL: 208 mg/dL — AB (ref 0–200)
HDL: 44 mg/dL (ref >39)
LDL Cholesterol: 140 mg/dL — ABNORMAL HIGH (ref 0–99)
Triglycerides: 121 mg/dL (ref <150)
VLDL: 24 mg/dL (ref 0–40)

## 2013-07-22 MED ORDER — ONDANSETRON HCL 4 MG/2ML IJ SOLN
INTRAMUSCULAR | Status: AC
  Filled 2013-07-22: qty 2

## 2013-07-22 MED ORDER — REGADENOSON 0.4 MG/5ML IV SOLN
INTRAVENOUS | Status: AC
  Filled 2013-07-22: qty 5

## 2013-07-22 MED ORDER — LIVING WELL WITH DIABETES BOOK
Freq: Once | Status: AC
  Administered 2013-07-22: 23:00:00
  Filled 2013-07-22: qty 1

## 2013-07-22 MED ORDER — TECHNETIUM TC 99M SESTAMIBI GENERIC - CARDIOLITE
30.0000 | Freq: Once | INTRAVENOUS | Status: AC | PRN
  Administered 2013-07-22: 30 via INTRAVENOUS

## 2013-07-22 MED ORDER — REGADENOSON 0.4 MG/5ML IV SOLN
0.4000 mg | Freq: Once | INTRAVENOUS | Status: AC
  Administered 2013-07-22: 0.4 mg via INTRAVENOUS
  Filled 2013-07-22: qty 5

## 2013-07-22 MED ORDER — SIMVASTATIN 20 MG PO TABS
20.0000 mg | ORAL_TABLET | Freq: Every day | ORAL | Status: DC
  Administered 2013-07-22 – 2013-07-23 (×2): 20 mg via ORAL
  Filled 2013-07-22 (×2): qty 1

## 2013-07-22 NOTE — Progress Notes (Signed)
Patient ID: QUEENIE AUFIERO, female   DOB: 08/13/1945, 68 y.o.   MRN: [id number]   Patient Name: Sandra Lawrence Date of Encounter: 07/22/2013     Principal Problem:   Acute chest pain Active Problems:   CAD (coronary artery disease)   Hypertensive heart disease without CHF   Diabetes mellitus type 2, noninsulin dependent   Hyperlipidemia   Sleep apnea   Chest pain    SUBJECTIVE  No chest pain this morning. She admits to have run out of Tramadol and to be taking lots of Naproxyn and BC powder for chronic arthritic pain.  CURRENT MEDS . aspirin EC  325 mg Oral Daily  . enoxaparin (LOVENOX) injection  40 mg Subcutaneous Q24H  . fluticasone  1 puff Inhalation BID  . hydrochlorothiazide  12.5 mg Oral Daily  . insulin aspart  0-9 Units Subcutaneous TID WC  . lisinopril  40 mg Oral Daily  . metoprolol tartrate  12.5 mg Oral BID  . regadenoson  0.4 mg Intravenous Once  . simvastatin  10 mg Oral q1800  . sodium chloride  3 mL Intravenous Q12H  . sodium chloride  3 mL Intravenous Q12H    OBJECTIVE  Filed Vitals:   07/21/13 2048 07/21/13 2100 07/22/13 0500 07/22/13 0839  BP: 134/64 134/64 162/65   Pulse: 66 66 66   Temp:  98.3 F (36.8 C) 97.9 F (36.6 C)   TempSrc:      Resp:  20 16   Height:      Weight:      SpO2:  100% 100% 100%    Intake/Output Summary (Last 24 hours) at 07/22/13 0900 Last data filed at 07/22/13 0500  Gross per 24 hour  Intake    243 ml  Output    250 ml  Net     -7 ml   Filed Weights   07/21/13 1221  Weight: 246 lb 11.2 oz (111.902 kg)    PHYSICAL EXAM  General: Pleasant, NAD 68 yo woman obese, NAD. Neuro: Alert and oriented X 3. Moves all extremities spontaneously. HEENT:  Normal  Neck: Supple without bruits or JVD. Lungs:  Resp regular and unlabored, CTA. Heart: RRR no s3, s4, or murmurs. Abdomen: Soft, obese, non-tender, non-distended, BS + x 4.  Extremities: No clubbing, cyanosis or edema. DP/PT/Radials 2+ and equal  bilaterally.  Accessory Clinical Findings  CBC  Recent Labs  07/21/13 1345 07/22/13 0125  WBC 6.1 6.2  HGB 12.4 11.5*  HCT 36.8 35.5*  MCV 85.4 86.0  PLT 253 382   Basic Metabolic Panel  Recent Labs  07/21/13 0755 07/21/13 1345 07/22/13 0125  NA 138  --  143  K 3.4*  --  3.7  CL 102  --  106  CO2 24  --  23  GLUCOSE 113*  --  132*  BUN 10  --  18  CREATININE 0.50 0.63 0.78  CALCIUM 9.0  --  9.2   Liver Function Tests No results found for this basename: AST, ALT, ALKPHOS, BILITOT, PROT, ALBUMIN,  in the last 72 hours No results found for this basename: LIPASE, AMYLASE,  in the last 72 hours Cardiac Enzymes  Recent Labs  07/21/13 1345 07/21/13 2133 07/22/13 0125  TROPONINI <0.30 <0.30 <0.30   BNP No components found with this basename: POCBNP,  D-Dimer No results found for this basename: DDIMER,  in the last 72 hours Hemoglobin A1C  Recent Labs  07/21/13 1640  HGBA1C 6.3*   Fasting Lipid  Panel  Recent Labs  07/22/13 0125  CHOL 208*  HDL 44  LDLCALC 140*  TRIG 121  CHOLHDL 4.7   Thyroid Function Tests No results found for this basename: TSH, T4TOTAL, FREET3, T3FREE, THYROIDAB,  in the last 72 hours  TELE NSR  ECG NSR with No acute STT changes  Radiology/Studies  Dg Chest 2 View  07/21/2013   CLINICAL DATA:  Chest pain. Tachycardia. Diabetes. coronary artery disease.  EXAM: CHEST  2 VIEW  COMPARISON:  DG CHEST 2 VIEW dated 04/17/2011; DG CHEST 2 VIEW dated 05/08/2010  FINDINGS: Cardiothoracic index 52%, compatible with mild cardiomegaly. Thoracic spondylosis noted. The lungs appear clear. No edema. No pleural effusion.  IMPRESSION: 1. Mild cardiomegaly. 2. Thoracic spondylosis.   Electronically Signed   By: Sherryl Barters M.D.   On: 07/21/2013 08:29    ASSESSMENT AND PLAN 1. Chest pain 2. Known CAD 3. Morbid obesity 4. Severe arthritis Rec: await stress test. If low risk, she can be discharged home with a prescription for Tramadol  which relieves her chronic arthritic complaints.  Jaslin Novitski,M.D.  07/22/2013 9:00 AM

## 2013-07-22 NOTE — Discharge Summary (Signed)
Physician Discharge Summary  Sandra Lawrence LOV:[id number] DOB: 02/26/1946 DOA: 07/21/2013  PCP: Wyatt Haste, MD  Admit date: 07/21/2013 Discharge date: 07/22/2013   NOTE - THIS IS A PRELIMINARY D/C SUMMARY  Time spent: >45 minutes  Discharge Diagnoses:  Principal Problem:   Acute chest pain Active Problems:   CAD (coronary artery disease)   Hypertensive heart disease without CHF   Diabetes mellitus type 2, noninsulin dependent   Hyperlipidemia   Sleep apnea   Discharge Condition: stable  Diet recommendation: heart healthy and ADA  Filed Weights   07/21/13 1221  Weight: 111.902 kg (246 lb 11.2 oz)    History of present illness:  Sandra Lawrence is a 68 y.o. female with PMH significant for HTN, Diabetes, hyperlipidemia, CAD who presents complaining of chest pain that started morning of admission. She relates that her heart started to beat fast, very fast. She felt chest pressure, dull chest pain in quality, associated with dyspnea. Pain was relieved by nitroglycerin and aspirin given by EMS. When EMS arrived her HR was at 150. In the ED her HR was at 89. Patient is chest pain free. She relates chest pain on exertion.  She had 2 episode last week but lasted seconds.    Hospital Course:  Chest pain with palpitation - discharge pending Myoview results  - Per CArdiology, OK to d/c on Tramadol which she is already taking.   Morbidly obese  Dyslipidemia - needs statin to get LDL < 70 - will add Simvastatin now - dietary consult  DM 2 - A1c 6.3 - will need Metformin to be started on d/c - dietary and DM coordinator consulted  Arthritis  Procedures:  3/8 myoview stress test  Consultations:  Cardio  Discharge Exam: Filed Vitals:   07/22/13 1314  BP: 131/53  Pulse: 54  Temp: 97.9 F (36.6 C)  Resp: 16    General: AAO x 3, no distress Cardiovascular: RRR, no murmurs Respiratory: CTA b/l   Discharge Instructions   Future Appointments Provider  Department Dept Phone   07/23/2013 9:55 AM [facility] [place] [phone]   07/23/2013 10:55 AM [facility] [place] [phone]   08/02/2013 9:00 AM [facility] [place]  IMAGING [phone]   Please wear two piece clothing and wear no powder or deodorant. Please arrive 15 minutes early prior to your appointment time.      No Known Allergies    The results of significant diagnostics from this hospitalization (including imaging, microbiology, ancillary and laboratory) are listed below for reference.    Significant Diagnostic Studies: Dg Chest 2 View  07/21/2013   CLINICAL DATA:  Chest pain. Tachycardia. Diabetes. coronary artery disease.  EXAM: CHEST  2 VIEW  COMPARISON:  DG CHEST 2 VIEW dated 04/17/2011; DG CHEST 2 VIEW dated 05/08/2010  FINDINGS: Cardiothoracic index 52%, compatible with mild cardiomegaly. Thoracic spondylosis noted. The lungs appear clear. No edema. No pleural effusion.  IMPRESSION: 1. Mild cardiomegaly. 2. Thoracic spondylosis.   Electronically Signed   By: Sherryl Barters M.D.   On: 07/21/2013 08:29    Microbiology: No results found for this or any previous visit (from the past 240 hour(s)).   Labs: Basic Metabolic Panel:  Recent Labs Lab 07/21/13 0755 07/21/13 1345 07/22/13 0125  NA 138  --  143  K 3.4*  --  3.7  CL 102  --  106  CO2 24  --  23  GLUCOSE 113*  --  132*  BUN 10  --  18  CREATININE 0.50 0.63 0.78  CALCIUM 9.0  --  9.2   Liver Function Tests: No results found for this basename: AST, ALT, ALKPHOS, BILITOT, PROT, ALBUMIN,  in the last 168 hours No results found for this basename: LIPASE, AMYLASE,  in the last 168 hours No results found for this basename: AMMONIA,  in the last 168 hours CBC:  Recent Labs Lab 07/21/13 0755 07/21/13 1345 07/22/13 0125  WBC 5.9 6.1 6.2  HGB 12.4 12.4 11.5*  HCT 37.5 36.8 35.5*  MCV 85.8 85.4 86.0  PLT 260 253  249   Cardiac Enzymes:  Recent Labs Lab 07/21/13 1345 07/21/13 2133 07/22/13 0125  TROPONINI <0.30 <0.30 <0.30   BNP: BNP (last 3 results) No results found for this basename: PROBNP,  in the last 8760 hours CBG:  Recent Labs Lab 07/21/13 1641 07/21/13 2036 07/22/13 0620 07/22/13 1129 07/22/13 1627  GLUCAP 108* 103* 99 113* 106*       SignedDebbe Odea, MD [facility] 07/22/2013, 5:45 PM

## 2013-07-22 NOTE — Progress Notes (Signed)
Utilization Review Completed.Donne Anon T3/12/2013

## 2013-07-22 NOTE — Progress Notes (Signed)
TC from [employer] in [place] exceeds wt limit for 1 day stress will need to be done over 2 days, will not need to be NPO for 2nd phase tomorrow

## 2013-07-22 NOTE — Progress Notes (Signed)
Inpatient Diabetes Program Recommendations  AACE/ADA: New Consensus Statement on Inpatient Glycemic Control (2013)  Target Ranges:  Prepandial:   less than 140 mg/dL      Peak postprandial:   less than 180 mg/dL (1-2 hours)      Critically ill patients:  140 - 180 mg/dL   Reason for Assessment: Diabetes Consult  Diabetes history: Type 2 DM Outpatient Diabetes medications: Xenical 120 mg tid Current orders for Inpatient glycemic control: Novolog sensitive tidwc.  68 y.o. female with PMH significant for HTN, Diabetes, hyperlipidemia, CAD who presents complaining of chest pain that started morning of admission. HgbA1C - 6.3%. All blood sugars since admission have been <135 mg/dL.  Recommendations: Encourage pt to view diabetes videos on pt ed channel. Would benefit from addition of metformin 500 mg bid at discharge. Will need prescription for glucose meter, strips and lancets at discharge. Will order OP Diabetes Education consult for diabetes and weight loss. Living Well With Diabetes ordered. F/U with PCP for diabetes management.  Thank you. Lorenda Peck, RD, LDN, CDE Inpatient Diabetes Coordinator [phone]

## 2013-07-23 ENCOUNTER — Other Ambulatory Visit (HOSPITAL_COMMUNITY): Payer: BC Managed Care – PPO

## 2013-07-23 DIAGNOSIS — R079 Chest pain, unspecified: Secondary | ICD-10-CM

## 2013-07-23 DIAGNOSIS — R9431 Abnormal electrocardiogram [ECG] [EKG]: Secondary | ICD-10-CM

## 2013-07-23 DIAGNOSIS — M129 Arthropathy, unspecified: Secondary | ICD-10-CM

## 2013-07-23 LAB — GLUCOSE, CAPILLARY
GLUCOSE-CAPILLARY: 95 mg/dL (ref 70–99)
Glucose-Capillary: 87 mg/dL (ref 70–99)

## 2013-07-23 MED ORDER — METOPROLOL TARTRATE 25 MG PO TABS: ORAL_TABLET | ORAL | Status: DC

## 2013-07-23 MED ORDER — TRAMADOL HCL 50 MG PO TABS: 50.0000 mg | ORAL_TABLET | Freq: Four times a day (QID) | ORAL | Status: DC | PRN

## 2013-07-23 MED ORDER — TECHNETIUM TC 99M SESTAMIBI - CARDIOLITE
30.0000 | Freq: Once | INTRAVENOUS | Status: AC | PRN
  Administered 2013-07-23: 09:00:00 30 via INTRAVENOUS

## 2013-07-23 NOTE — Progress Notes (Signed)
Pt provided with dc instructions and education. Pt verbalized understanding. No questinos at this time. IV removed with tip intact. Heart monitor cleaned and returned to front. Dorna Bloom, RN

## 2013-07-23 NOTE — Progress Notes (Signed)
Patient ID: Sandra Lawrence, female   DOB: 1945/07/20, 68 y.o.   MRN: [id number]   Patient Name: KIRAT MEZQUITA Date of Encounter: 07/23/2013     Principal Problem:   Acute chest pain Active Problems:   CAD (coronary artery disease)   Hypertensive heart disease without CHF   Diabetes mellitus type 2, noninsulin dependent   Hyperlipidemia   Sleep apnea   Chest pain    SUBJECTIVE  No chest pain this morning. She admits to have run out of Tramadol and to be taking lots of Naproxyn and BC powder for chronic arthritic pain.  She is getting a 2 day myoview study  CURRENT MEDS . aspirin EC  325 mg Oral Daily  . enoxaparin (LOVENOX) injection  40 mg Subcutaneous Q24H  . fluticasone  1 puff Inhalation BID  . hydrochlorothiazide  12.5 mg Oral Daily  . insulin aspart  0-9 Units Subcutaneous TID WC  . lisinopril  40 mg Oral Daily  . metoprolol tartrate  12.5 mg Oral BID  . simvastatin  20 mg Oral q1800  . sodium chloride  3 mL Intravenous Q12H  . sodium chloride  3 mL Intravenous Q12H    OBJECTIVE  Filed Vitals:   07/22/13 1314 07/22/13 2058 07/22/13 2100 07/23/13 0500  BP: 131/53 180/76 180/76 149/62  Pulse: 54 63 63 60  Temp: 97.9 F (36.6 C)  98 F (36.7 C) 98 F (36.7 C)  TempSrc: Axillary     Resp: 16  20 18   Height:      Weight:      SpO2: 98%  95% 100%    Intake/Output Summary (Last 24 hours) at 07/23/13 1047 Last data filed at 07/23/13 0500  Gross per 24 hour  Intake    240 ml  Output    150 ml  Net     90 ml   Filed Weights   07/21/13 1221  Weight: 246 lb 11.2 oz (111.902 kg)    PHYSICAL EXAM  General: Pleasant, NAD 68 yo woman obese, NAD. Neuro: Alert and oriented X 3. Moves all extremities spontaneously. HEENT:  Normal  Neck: Supple without bruits or JVD. Lungs:  Resp regular and unlabored, CTA. Heart: RRR no s3, s4, or murmurs. Abdomen: Soft, obese, non-tender, non-distended, BS + x 4.  Extremities: No clubbing, cyanosis or edema. DP/PT/Radials  2+ and equal bilaterally.  Accessory Clinical Findings  CBC  Recent Labs  07/21/13 1345 07/22/13 0125  WBC 6.1 6.2  HGB 12.4 11.5*  HCT 36.8 35.5*  MCV 85.4 86.0  PLT 253 053   Basic Metabolic Panel  Recent Labs  07/21/13 0755 07/21/13 1345 07/22/13 0125  NA 138  --  143  K 3.4*  --  3.7  CL 102  --  106  CO2 24  --  23  GLUCOSE 113*  --  132*  BUN 10  --  18  CREATININE 0.50 0.63 0.78  CALCIUM 9.0  --  9.2   Liver Function Tests No results found for this basename: AST, ALT, ALKPHOS, BILITOT, PROT, ALBUMIN,  in the last 72 hours No results found for this basename: LIPASE, AMYLASE,  in the last 72 hours Cardiac Enzymes  Recent Labs  07/21/13 1345 07/21/13 2133 07/22/13 0125  TROPONINI <0.30 <0.30 <0.30   BNP No components found with this basename: POCBNP,  D-Dimer No results found for this basename: DDIMER,  in the last 72 hours Hemoglobin A1C  Recent Labs  07/21/13 1640  HGBA1C 6.3*  Fasting Lipid Panel  Recent Labs  07/22/13 0125  CHOL 208*  HDL 44  LDLCALC 140*  TRIG 121  CHOLHDL 4.7   Thyroid Function Tests  Recent Labs  07/22/13 0125  TSH 2.415    TELE NSR  ECG NSR with No acute STT changes  Radiology/Studies  Dg Chest 2 View  07/21/2013   CLINICAL DATA:  Chest pain. Tachycardia. Diabetes. coronary artery disease.  EXAM: CHEST  2 VIEW  COMPARISON:  DG CHEST 2 VIEW dated 04/17/2011; DG CHEST 2 VIEW dated 05/08/2010  FINDINGS: Cardiothoracic index 52%, compatible with mild cardiomegaly. Thoracic spondylosis noted. The lungs appear clear. No edema. No pleural effusion.  IMPRESSION: 1. Mild cardiomegaly. 2. Thoracic spondylosis.   Electronically Signed   By: Sherryl Barters M.D.   On: 07/21/2013 08:29    ASSESSMENT AND PLAN 1. Chest pain myoview is normal.  No ischemia.  She should be able to go home today.  Follow up with Dr. Tamala Julian in the office.   Thayer Headings, Brooke Bonito., MD, [facility] 07/23/2013, 2:59 PM Office -  [phone] Pager [phone]

## 2013-07-23 NOTE — Progress Notes (Signed)
Inpatient Diabetes Program Recommendations  AACE/ADA: New Consensus Statement on Inpatient Glycemic Control (2013)  Target Ranges:  Prepandial:   less than 140 mg/dL      Peak postprandial:   less than 180 mg/dL (1-2 hours)      Critically ill patients:  140 - 180 mg/dL    Results for DELORAS, REICHARD (MRN [id number]) as of 07/23/2013 13:34  Ref. Range 07/22/2013 06:20 07/22/2013 11:29 07/22/2013 16:27 07/22/2013 20:00  Glucose-Capillary Latest Range: 70-99 mg/dL 99 113 (H) 106 (H) 94    Results for SABIHA, SURA (MRN [id number]) as of 07/23/2013 13:34  Ref. Range 07/23/2013 07:51 07/23/2013 11:15  Glucose-Capillary Latest Range: 70-99 mg/dL 95 87    Results for ACASIA, SKILTON (MRN [id number]) as of 07/23/2013 13:34  Ref. Range 07/21/2013 16:40  Hemoglobin A1C Latest Range: <5.7 % 6.3 (H)    Diabetes history: Type 2 DM Outpatient Diabetes meds: None Current orders: Novolog Sensitive SSI   **Spoke with patient about her current A1c of 6.3%.  Pt well controlled at present on no medications at home.  A1c well within goal.  Patient stated she sees Dr. Redmond School as an outpatient and just recently saw Dr. Redmond School back in January.  **Mentioned to patient that Dr. Wynelle Cleveland had brought up the question of whether pt will be sent home on Metformin.  Patient adamantly told me she would NOT take Metformin at home.  Pt told me she has taken Metformin in the past and that it gave her terrible GI upset.  Pt told me she had to leave her job early many times b/c of the GI upset.  Pt told me she feels like she knows how to control her CBGs and would prefer to continue to manage with diet alone.    Will follow. Wyn Quaker RN, MSN, CDE Diabetes Coordinator Inpatient Diabetes Program Team Pager: [phone] (8a-10p)

## 2013-07-23 NOTE — Plan of Care (Signed)
Problem: Food- and Nutrition-Related Knowledge Deficit (NB-1.1) Goal: Nutrition education Formal process to instruct or train a patient/client in a skill or to impart knowledge to help patients/clients voluntarily manage or modify food choices and eating behavior to maintain or improve health.  RD consulted for nutrition education regarding diabetes. Patient states, "I'm just borderline!"    Lab Results  Component Value Date    HGBA1C 6.3* 07/21/2013    RD provided "Carbohydrate Counting for People with Diabetes" handout from the Academy of [employer] and Dietetics. Discussed different food groups and their effects on blood sugar, emphasizing carbohydrate-containing foods. Provided list of carbohydrates and recommended serving sizes of common foods.  Discussed importance of controlled and consistent carbohydrate intake throughout the day. Provided examples of ways to balance meals/snacks and encouraged intake of high-fiber, whole grain complex carbohydrates. Teach back method used.  Expect poor to fair compliance.  Body mass index is 38.63 kg/(m^2). Pt meets criteria for obesity, class 2 based on current BMI.  Current diet order is heart healthy, CHO-modified, patient is consuming approximately 100% of meals at this time. Labs and medications reviewed. No further nutrition interventions warranted at this time. RD contact information provided. If additional nutrition issues arise, please re-consult RD.  Molli Barrows, RD, LDN, [place] Pager [phone] After Hours Pager [phone]

## 2013-07-23 NOTE — Discharge Summary (Signed)
Physician Discharge Summary  Patient ID: Sandra Lawrence MRN: [id number] DOB/AGE: 66-Aug-1947 66 y.o.  Admit date: 07/21/2013 Discharge date: 07/23/2013  Primary Care Physician:  Wyatt Haste, MD  Discharge Diagnoses:    . Acute chest pain resolved  . CAD (coronary artery disease) . Diabetes mellitus type 2, noninsulin dependent . Hyperlipidemia . Hypertensive heart disease without CHF . Sleep apnea   Consults:  Cardiology   Recommendations for Outpatient Follow-up:  Please follow patient for diabetes mellitus, her hemoglobin A1c is 6.3, patient does not want to be on any medication at this time.  Allergies:  No Known Allergies   Discharge Medications:   Medication List         albuterol 108 (90 BASE) MCG/ACT inhaler  Commonly known as:  PROVENTIL HFA;VENTOLIN HFA  Inhale 1-2 puffs into the lungs every 6 (six) hours as needed for wheezing or shortness of breath.     aspirin EC 325 MG tablet  Take 325 mg by mouth daily.     BC HEADACHE POWDER PO  Take 1 packet by mouth daily as needed (for pain).     beclomethasone 80 MCG/ACT inhaler  Commonly known as:  QVAR  Inhale 2 puffs into the lungs 2 (two) times daily as needed (for shortness of breath).     hydrochlorothiazide 12.5 MG capsule  Commonly known as:  MICROZIDE  Take 12.5 mg by mouth daily.     lisinopril 40 MG tablet  Commonly known as:  PRINIVIL,ZESTRIL  Take 40 mg by mouth daily.     LORazepam 1 MG tablet  Commonly known as:  ATIVAN  Take 1 mg by mouth every 8 (eight) hours as needed for anxiety.     metoprolol tartrate 25 MG tablet  Commonly known as:  LOPRESSOR  Take metoprolol 12.5 mg (split 25mg  tab to half) oral BID (twice a day).     naproxen sodium 220 MG tablet  Commonly known as:  ANAPROX  Take 440 mg by mouth 2 (two) times daily as needed (for pain).     nitroGLYCERIN 0.4 MG SL tablet  Commonly known as:  NITROSTAT  Place 0.4 mg under the tongue every 5 (five) minutes as needed  for chest pain.     orlistat 120 MG capsule  Commonly known as:  XENICAL  Take 120 mg by mouth 3 (three) times daily with meals.     pravastatin 40 MG tablet  Commonly known as:  PRAVACHOL  Take 40 mg by mouth at bedtime.     traMADol 50 MG tablet  Commonly known as:  ULTRAM  Take 50 mg by mouth every 6 (six) hours as needed for moderate pain.         Brief H and P: For complete details please refer to admission H and P, but in brief the patient is a 68 year old female with history of hypertension, diabetes, hyperlipidemia, CAD presented with chest pain that started on the morning of admission. Patient reported tachycardia and felt chest pressure, dull chest pain in quality, associated with dyspnea. Pain was relieved by nitroglycerin and aspirin given by EMS. When EMS arrived her HR was at 150. In the ED her HR was at 89. Patient is chest pain free. She relates chest pain on exertion. She had 2 episode last week but lasted seconds.  In the ED EKG show old T wave inversion, first set troponin negative.     Hospital Course:   Chest pain with palpitation : completely resolved. Patient was  ruled out for acute ACS. She was monitored on telemetry and did not show any atrial fibrillation or arrhythmias. TSH 2.4. Cardiology consult was obtained and patient underwent nuclear medicine stress test which showed EF of 65%, normal perfusion. Per cardiology okay to continue tramadol.  Morbidly obese patient was counseled for diet and weight control  Dyslipidemia  Lipid panel showed cholesterol 208 with LDL of 140, patient was continued on statin and dietary consult was obtained for dietary education.  Type 2 diabetes mellitus uncontrolled with complications - 123456 6.3 , patient adamantly refused metformin or any oral hypoglycemics at this time. Patient was given nutrition consult and diabetic coordinator consult. I strongly recommended her to discuss it with her PCP and take oral hypoglycemic agent  after discussion with her primary physician   Arthritis continue tramadol   Day of Discharge BP 149/76  Pulse 95  Temp(Src) 98.2 F (36.8 C) (Oral)  Resp 18  Ht 5\' 7"  (1.702 m)  Wt 111.902 kg (246 lb 11.2 oz)  BMI 38.63 kg/m2  SpO2 99%  Physical Exam: General: Alert and awake oriented x3 not in any acute distress. HEENT: anicteric sclera, pupils reactive to light and accommodation CVS: S1-S2 clear no murmur rubs or gallops Chest: clear to auscultation bilaterally, no wheezing rales or rhonchi Abdomen: soft nontender, nondistended, normal bowel sounds Extremities: no cyanosis, clubbing or edema noted bilaterally Neuro: Cranial nerves II-XII intact, no focal neurological deficits   The results of significant diagnostics from this hospitalization (including imaging, microbiology, ancillary and laboratory) are listed below for reference.    LAB RESULTS: Basic Metabolic Panel:  Recent Labs Lab 07/21/13 0755 07/21/13 1345 07/22/13 0125  NA 138  --  143  K 3.4*  --  3.7  CL 102  --  106  CO2 24  --  23  GLUCOSE 113*  --  132*  BUN 10  --  18  CREATININE 0.50 0.63 0.78  CALCIUM 9.0  --  9.2   Liver Function Tests: No results found for this basename: AST, ALT, ALKPHOS, BILITOT, PROT, ALBUMIN,  in the last 168 hours No results found for this basename: LIPASE, AMYLASE,  in the last 168 hours No results found for this basename: AMMONIA,  in the last 168 hours CBC:  Recent Labs Lab 07/21/13 1345 07/22/13 0125  WBC 6.1 6.2  HGB 12.4 11.5*  HCT 36.8 35.5*  MCV 85.4 86.0  PLT 253 249   Cardiac Enzymes:  Recent Labs Lab 07/21/13 2133 07/22/13 0125  TROPONINI <0.30 <0.30   BNP: No components found with this basename: POCBNP,  CBG:  Recent Labs Lab 07/23/13 0751 07/23/13 1115  GLUCAP 95 87    Significant Diagnostic Studies:  Dg Chest 2 View  07/21/2013   CLINICAL DATA:  Chest pain. Tachycardia. Diabetes. coronary artery disease.  EXAM: CHEST  2 VIEW   COMPARISON:  DG CHEST 2 VIEW dated 04/17/2011; DG CHEST 2 VIEW dated 05/08/2010  FINDINGS: Cardiothoracic index 52%, compatible with mild cardiomegaly. Thoracic spondylosis noted. The lungs appear clear. No edema. No pleural effusion.  IMPRESSION: 1. Mild cardiomegaly. 2. Thoracic spondylosis.   Electronically Signed   By: Sherryl Barters M.D.   On: 07/21/2013 08:29    Nuclear medicine stress test FINDINGS:  Stress data: Baseline EKG showed SR 61 bpm. Nonspecific ST T wave  changes. With infusion of Lexiscan there were no changes to suggest  ischemia. Nuclear data. In the initial stress images there was  normal perfusion. In the rest  images there was normal perfusion. On  gating LVEF was calculated at 65% with normal wall motion  IMPRESSION:  Lexiscan myoview: Electrically negative for ischemia Myoview scan  with normal perfusion. LVEF calculated at 65%.   Disposition and Follow-up:     Discharge Orders   Future Appointments Provider Department Dept Phone   08/02/2013 9:00 AM [facility] [place]  IMAGING [phone]   Please wear two piece clothing and wear no powder or deodorant. Please arrive 15 minutes early prior to your appointment time.   Future Orders Complete By Expires   Ambulatory referral to Nutrition and Diabetic Education  As directed    Diet Carb Modified  As directed    Increase activity slowly  As directed        DISPOSITION: Home  DIET: Carb modified diet    DISCHARGE FOLLOW-UP Follow-up Information   Follow up with Wyatt Haste, MD. Schedule an appointment as soon as possible for a visit in 2 weeks. (for hospital follow-up)    Specialty:  Family Medicine   Contact information:   [place] [address] [phone]       Time spent on Discharge: 35 mins  Signed:   Elliot Simoneaux M.D. [facility] 07/23/2013, 2:50 PM Pager: [phone]

## 2013-07-26 ENCOUNTER — Other Ambulatory Visit: Payer: Self-pay | Admitting: Family Medicine

## 2013-08-02 ENCOUNTER — Ambulatory Visit
Admission: RE | Admit: 2013-08-02 | Discharge: 2013-08-02 | Disposition: A | Discharge status: Home / Self Care | Payer: Medicare HMO | Source: Ambulatory Visit

## 2013-08-02 ENCOUNTER — Other Ambulatory Visit: Payer: Self-pay

## 2013-08-02 DIAGNOSIS — Z1231 Encounter for screening mammogram for malignant neoplasm of breast: Secondary | ICD-10-CM

## 2013-08-03 ENCOUNTER — Telehealth: Payer: Self-pay | Admitting: Family Medicine

## 2013-08-03 ENCOUNTER — Other Ambulatory Visit: Payer: Self-pay | Admitting: Family Medicine

## 2013-08-03 MED ORDER — HYDROCHLOROTHIAZIDE 12.5 MG PO CAPS: 12.5000 mg | ORAL_CAPSULE | Freq: Every day | ORAL | Status: DC

## 2013-08-03 NOTE — Telephone Encounter (Signed)
RX REFILLS SENT FOR 30 DAYS ONLY. CLS

## 2013-08-07 ENCOUNTER — Other Ambulatory Visit: Payer: Self-pay | Admitting: Family Medicine

## 2013-08-07 DIAGNOSIS — R928 Other abnormal and inconclusive findings on diagnostic imaging of breast: Secondary | ICD-10-CM

## 2013-08-09 ENCOUNTER — Encounter: Payer: Self-pay | Admitting: Family Medicine

## 2013-08-09 ENCOUNTER — Ambulatory Visit (INDEPENDENT_AMBULATORY_CARE_PROVIDER_SITE_OTHER): Payer: Medicare PPO | Admitting: Family Medicine

## 2013-08-09 VITALS — BP 140/90 | HR 76 | Wt 261.0 lb

## 2013-08-09 DIAGNOSIS — M1990 Unspecified osteoarthritis, unspecified site: Secondary | ICD-10-CM

## 2013-08-09 DIAGNOSIS — M129 Arthropathy, unspecified: Secondary | ICD-10-CM

## 2013-08-09 DIAGNOSIS — I2510 Atherosclerotic heart disease of native coronary artery without angina pectoris: Secondary | ICD-10-CM

## 2013-08-09 DIAGNOSIS — Z6841 Body Mass Index (BMI) 40.0 and over, adult: Secondary | ICD-10-CM

## 2013-08-09 DIAGNOSIS — E119 Type 2 diabetes mellitus without complications: Secondary | ICD-10-CM

## 2013-08-09 MED ORDER — TRAMADOL HCL 50 MG PO TABS: 50.0000 mg | ORAL_TABLET | Freq: Four times a day (QID) | ORAL | Status: DC | PRN

## 2013-08-09 MED ORDER — METOPROLOL TARTRATE 25 MG PO TABS: ORAL_TABLET | ORAL | Status: DC

## 2013-08-09 NOTE — Patient Instructions (Signed)
Followup with Dr. Alvan Dame concerning your right knee

## 2013-08-09 NOTE — Progress Notes (Signed)
   Subjective:    Patient ID: Sandra Lawrence, female    DOB: 04/22/1946, 68 y.o.   MRN: [id number]  HPI He is here for a hospital followup. She was seen recently for evaluation of chest pain. At the discharge summary was reviewed. She had a negative stress echo. According to her she did have PVCs in the hospital and was placed back on a beta blocker. The discharge summary did not mention that. Her main complaint today is continued right knee pain. She has had previous left knee replacement and is interested in having the right knee done. She was seeing a physician in [facility] however the left knee was replaced by Dr. Alvan Dame. She also continues to have right hip pain with evidence of arthritis. While in the hospital her hemoglobin A1c was checked and is 6.3.  Review of Systems     Objective:   Physical Exam Alert and in no distress otherwise not examined       Assessment & Plan:  Arthritis  CAD (coronary artery disease)  Diabetes mellitus type 2, noninsulin dependent  Morbid obesity with BMI of 40.0-44.9, adult  discussed the knee replacement and strongly encouraged her to have it done here in town to make it more convenient for her and her relatives. Also discussed her diabetes. She apparently did have difficulty with metformin in the past and her present A1c does not indicate a need to add any diabetes medications. I will renew her tramadol as well as her metoprolol. Check here 4 months

## 2013-08-13 ENCOUNTER — Telehealth: Payer: Self-pay | Admitting: Family Medicine

## 2013-08-13 NOTE — Telephone Encounter (Signed)
Left message with emergency contact, son Saralyn Pilar,  for pt to call me back. None of her numbers are correct. Need to verify disability parking placard.

## 2013-08-15 ENCOUNTER — Other Ambulatory Visit: Payer: Self-pay | Admitting: Family Medicine

## 2013-08-15 ENCOUNTER — Ambulatory Visit
Admission: RE | Admit: 2013-08-15 | Discharge: 2013-08-15 | Disposition: A | Discharge status: Home / Self Care | Payer: Medicare HMO | Source: Ambulatory Visit | Attending: Family Medicine | Admitting: Family Medicine

## 2013-08-15 DIAGNOSIS — R928 Other abnormal and inconclusive findings on diagnostic imaging of breast: Secondary | ICD-10-CM

## 2013-08-15 DIAGNOSIS — N6320 Unspecified lump in the left breast, unspecified quadrant: Secondary | ICD-10-CM

## 2013-08-23 ENCOUNTER — Telehealth: Payer: Self-pay | Admitting: Internal Medicine

## 2013-08-23 NOTE — Telephone Encounter (Signed)
Faxed over medical records to [facility]

## 2013-08-24 ENCOUNTER — Other Ambulatory Visit: Payer: Self-pay | Admitting: Family Medicine

## 2013-08-24 ENCOUNTER — Ambulatory Visit
Admission: RE | Admit: 2013-08-24 | Discharge: 2013-08-24 | Disposition: A | Discharge status: Home / Self Care | Payer: Medicare HMO | Source: Ambulatory Visit | Attending: Family Medicine | Admitting: Family Medicine

## 2013-08-24 DIAGNOSIS — N6320 Unspecified lump in the left breast, unspecified quadrant: Secondary | ICD-10-CM

## 2013-08-27 ENCOUNTER — Ambulatory Visit
Admission: RE | Admit: 2013-08-27 | Discharge: 2013-08-27 | Disposition: A | Discharge status: Home / Self Care | Payer: Medicare HMO | Source: Ambulatory Visit | Attending: Family Medicine | Admitting: Family Medicine

## 2013-08-27 ENCOUNTER — Telehealth: Payer: Self-pay | Admitting: Family Medicine

## 2013-08-27 DIAGNOSIS — N6320 Unspecified lump in the left breast, unspecified quadrant: Secondary | ICD-10-CM

## 2013-08-27 NOTE — Telephone Encounter (Signed)
Pt taking Tramadol & BC 4 x day & still hurting, getting worse.  She wants to know what you recommend, pain becoming unbearable.

## 2013-08-28 NOTE — Telephone Encounter (Signed)
Called and left message on pt VM regarding her pain

## 2013-08-28 NOTE — Telephone Encounter (Signed)
I need to know more about her pain. Not sure what. He is referring to. She recently had some biopsies.

## 2013-08-31 NOTE — Telephone Encounter (Signed)
Have her add Tylenol to this and if continued difficulty however set up an appointment to be seen

## 2013-08-31 NOTE — Telephone Encounter (Signed)
Pt states she is having knee and hip pain with a dull ache, she is taking tramadol 1 every 6hrs and she takes a BC every 4 hrs with no relief and she would like to know if there is something else she can take. Please advise

## 2013-09-05 ENCOUNTER — Other Ambulatory Visit: Payer: Self-pay | Admitting: Family Medicine

## 2013-09-06 NOTE — Telephone Encounter (Signed)
Pt informed, of J[facility] note & refill sent into pharmacy,  Advised to call for appt if no better

## 2013-09-06 NOTE — Telephone Encounter (Signed)
Is this okay to refill? 

## 2013-09-06 NOTE — Telephone Encounter (Signed)
Called in med to [facility]

## 2013-09-06 NOTE — Telephone Encounter (Signed)
Okay to renew

## 2013-09-10 ENCOUNTER — Other Ambulatory Visit (INDEPENDENT_AMBULATORY_CARE_PROVIDER_SITE_OTHER): Payer: Self-pay | Admitting: Surgery

## 2013-09-10 ENCOUNTER — Ambulatory Visit (INDEPENDENT_AMBULATORY_CARE_PROVIDER_SITE_OTHER): Payer: Medicare HMO | Admitting: Surgery

## 2013-09-10 ENCOUNTER — Encounter (INDEPENDENT_AMBULATORY_CARE_PROVIDER_SITE_OTHER): Payer: Self-pay | Admitting: Surgery

## 2013-09-10 VITALS — BP 138/84 | HR 80 | Temp 97.2°F | Resp 14 | Ht 66.5 in | Wt 258.0 lb

## 2013-09-10 DIAGNOSIS — N63 Unspecified lump in unspecified breast: Secondary | ICD-10-CM

## 2013-09-10 DIAGNOSIS — N6320 Unspecified lump in the left breast, unspecified quadrant: Secondary | ICD-10-CM

## 2013-09-10 NOTE — Patient Instructions (Signed)
Lumpectomy A lumpectomy is a form of "breast conserving" or "breast preservation" surgery. It may also be referred to as a partial mastectomy. During a lumpectomy, the portion of the breast that contains the cancerous tumor or breast mass (the lump) is removed. Some normal tissue around the lump may also be removed to make sure all the tumor has been removed. This surgery should take 40 minutes or less. LET YOUR HEALTH CARE PROVIDER KNOW ABOUT:  Any allergies you have.  All medicines you are taking, including vitamins, herbs, eye drops, creams, and over-the-counter medicines.  Previous problems you or members of your family have had with the use of anesthetics.  Any blood disorders you have.  Previous surgeries you have had.  Medical conditions you have. RISKS AND COMPLICATIONS Generally, this is a safe procedure. However, as with any procedure, complications can occur. Possible complications include:  Bleeding.  Infection.  Pain.  Temporary swelling.  Change in the shape of the breast, particularly if a large portion is removed. BEFORE THE PROCEDURE  Ask your health care provider about changing or stopping your regular medicines.  Do not eat or drink anything for 7 8 hours before the surgery or as directed by your health care provider. Ask your health care provider if you can take a sip of water with any approved medicines.  On the day of surgery, your healthcare provider will use a mammogram or ultrasound to locate and mark the tumor in your breast. These markings on your breast will show where the cut (incision) will be made. PROCEDURE   An IV tube will be put into one of your veins.  You may be given medicine to help you relax before the surgery (sedative). You will be given one of the following:  A medicine that numbs the area (local anesthesia).  A medicine that makes you go to sleep (general anesthesia).  Your health care provider will use a kind of electric scalpel  that uses heat to minimize bleeding (electrocautery knife).  A curved incision (like a smile or frown) that follows the natural curve of your breast is made, to allow for minimal scarring and better healing.  The tumor will be removed with some of the surrounding tissue. This will be sent to the lab for analysis. Your health care provider may also remove your lymph nodes at this time if needed.  Sometimes, but not always, a rubber tube called a drain will be surgically inserted into your breast area or armpit to collect excess fluid that may accumulate in the space where the tumor was. This drain is connected to a plastic bulb on the outside of your body. This drain creates suction to help remove the fluid.  The incisions will be closed with stitches (sutures).  A bandage may be placed over the incisions. AFTER THE PROCEDURE  You will be taken to the recovery area.  You will be given medicine for pain.  A small rubber drain may be placed in the breast for 2 3 days to prevent a collection of blood (hematoma) from developing in the breast. You will be given instructions on caring for the drain before you go home.  A pressure bandage (dressing) will be applied for 1 2 days to prevent bleeding. Ask your health care provider how to care for your bandage at home. Document Released: 06/14/2006 Document Revised: 01/03/2013 Document Reviewed: 10/06/2012 Exit[employer] Patient Information 2014 [employer].  

## 2013-09-10 NOTE — Progress Notes (Signed)
Patient ID: Sandra Lawrence, female   DOB: 1945/10/27, 68 y.o.   MRN: [id number]  Chief Complaint  Patient presents with  . New Evaluation    eval Lt sclerosed papillary lesion    HPI Sandra Lawrence is a 68 y.o. female.   HPI Patient sent at Request of Denita Lung, MD and [facility] for sclerosing lesion left breast found on mammogram,  U/S and core biopsy.  No symptoms of breast pain,  Discharge or mass.    Past Medical History  Diagnosis Date  . Hypertensive heart disease without congestive heart failure   . Obesity   . Degenerative joint disease     BOTH KNEES  . PVC (premature ventricular contraction)   . Palpitations   . Asthma   . Hypercholesteremia   . Fatty tumor   . CAD (coronary artery disease) 02/15/2007  . Hyperlipidemia 04/17/2011  . Anxiety   . Allergy   . Sleep apnea     wears CPAP  . Blood transfusion without reported diagnosis   . Depression   . Diabetes mellitus     no medicines  . Diabetes mellitus type 2, noninsulin dependent   . GERD (gastroesophageal reflux disease)   . Heart murmur   . Hypertension   . Chronic kidney disease     kidney stones  . Osteoporosis   . Bursitis     right hip    Past Surgical History  Procedure Laterality Date  . Hemorroidectomy    . Cesarean section    . Replacement total knee  left knee    Family History  Problem Relation Age of Onset  . Cancer Father   . Diabetes Mother   . Colon cancer Neg Hx   . Esophageal cancer Neg Hx   . Stomach cancer Neg Hx   . Rectal cancer Neg Hx     Social History History  Substance Use Topics  . Smoking status: Former Smoker    Quit date: 11/10/1979  . Smokeless tobacco: Never Used  . Alcohol Use: Yes     Comment: occasionally    No Known Allergies  Current Outpatient Prescriptions  Medication Sig Dispense Refill  . aspirin EC 325 MG tablet Take 325 mg by mouth daily.        . Aspirin-Salicylamide-Caffeine (BC HEADACHE POWDER PO) Take 1 packet by mouth daily as needed  (for pain).      Marland Kitchen beclomethasone (QVAR) 80 MCG/ACT inhaler Inhale 2 puffs into the lungs 2 (two) times daily as needed (for shortness of breath).      . hydrochlorothiazide (MICROZIDE) 12.5 MG capsule Take 1 capsule (12.5 mg total) by mouth daily. PATIENT NEEDS A FOLLOW UP VISIT FOR BLOOD PRESSURE  30 capsule  0  . lisinopril (PRINIVIL,ZESTRIL) 40 MG tablet TAKE 1 TABLET BY MOUTH EVERY DAY FOR HYPERTENSION  90 tablet  0  . LORazepam (ATIVAN) 1 MG tablet Take 1 mg by mouth every 8 (eight) hours as needed for anxiety.      . metoprolol tartrate (LOPRESSOR) 25 MG tablet Take metoprolol 12.5 mg (split 25mg  tab to half) oral BID (twice a day).  90 tablet  3  . metoprolol tartrate (LOPRESSOR) 25 MG tablet Take metoprolol 12.5 mg (split 25mg  tab to half) oral BID (twice a day).  30 tablet  3  . naproxen sodium (ANAPROX) 220 MG tablet Take 440 mg by mouth 2 (two) times daily as needed (for pain).      . nitroGLYCERIN (NITROSTAT)  0.4 MG SL tablet Place 0.4 mg under the tongue every 5 (five) minutes as needed for chest pain.       Marland Kitchen orlistat (XENICAL) 120 MG capsule Take 120 mg by mouth 3 (three) times daily with meals.      . pravastatin (PRAVACHOL) 40 MG tablet TAKE 1 TABLET (40 MG TOTAL) BY MOUTH DAILY.  90 tablet  0  . traMADol (ULTRAM) 50 MG tablet TAKE 1 TABLET EVERY 6 HOURS AS NEEDED FOR MODERATE PAIN  50 tablet  1   No current facility-administered medications for this visit.    Review of Systems Review of Systems  Constitutional: Negative for fever, chills and unexpected weight change.  HENT: Negative for congestion, hearing loss, sore throat, trouble swallowing and voice change.   Eyes: Negative for visual disturbance.  Respiratory: Negative for cough and wheezing.   Cardiovascular: Negative for chest pain, palpitations and leg swelling.  Gastrointestinal: Negative for nausea, vomiting, abdominal pain, diarrhea, constipation, blood in stool, abdominal distention and anal bleeding.   Genitourinary: Negative for hematuria, vaginal bleeding and difficulty urinating.  Musculoskeletal: Negative for arthralgias.  Skin: Negative for rash and wound.  Neurological: Negative for seizures, syncope and headaches.  Hematological: Negative for adenopathy. Does not bruise/bleed easily.  Psychiatric/Behavioral: Negative for confusion.    Blood pressure 138/84, pulse 80, temperature 97.2 F (36.2 C), temperature source Temporal, resp. rate 14, height 5' 6.5" (1.689 m), weight 258 lb (117.028 kg).  Physical Exam Physical Exam  Constitutional: She is oriented to person, place, and time. She appears well-developed and well-nourished.  HENT:  Head: Normocephalic and atraumatic.  Eyes: Pupils are equal, round, and reactive to light. No scleral icterus.  Neck: Normal range of motion.  Pulmonary/Chest: Effort normal and breath sounds normal. Right breast exhibits no inverted nipple, no mass, no nipple discharge, no skin change and no tenderness. Left breast exhibits no inverted nipple, no mass, no nipple discharge, no skin change and no tenderness. Breasts are symmetrical.  Musculoskeletal: Normal range of motion.  Lymphadenopathy:    She has no cervical adenopathy.  Neurological: She is alert and oriented to person, place, and time.  Skin: Skin is warm and dry.  Psychiatric: She has a normal mood and affect. Her behavior is normal. Judgment and thought content normal.    Data Reviewed Breast, left, needle core biopsy, 1:00 - SCLEROSED PAPILLARY LESION, SEE COMMENT  Assessment    Left breast mas/  Sclerosing lesion central    Plan    Recommend left breast seed  Localized lumpectomy. Operative and nonoperative management strategies discussed. Risk of malignancy the situation is about 3%. She would like to proceed for excision.The procedure has been discussed with the patient. Alternatives to surgery have been discussed with the patient.  Risks of surgery include bleeding,   Infection,  Seroma formation, death,  and the need for further surgery.   The patient understands and wishes to proceed.       Tannie Koskela A. Madyn Ivins 09/10/2013, 10:02 AM

## 2013-09-27 ENCOUNTER — Encounter (HOSPITAL_BASED_OUTPATIENT_CLINIC_OR_DEPARTMENT_OTHER): Payer: Self-pay | Admitting: *Deleted

## 2013-09-27 NOTE — Progress Notes (Signed)
To come in for labs -had ekg and stress test 3/15 Will bring all meds and cpap

## 2013-09-28 ENCOUNTER — Encounter (HOSPITAL_BASED_OUTPATIENT_CLINIC_OR_DEPARTMENT_OTHER)
Admission: RE | Admit: 2013-09-28 | Discharge: 2013-09-28 | Disposition: A | Discharge status: Home / Self Care | Payer: Medicare HMO | Source: Ambulatory Visit | Attending: Surgery | Admitting: Surgery

## 2013-09-28 LAB — CBC WITH DIFFERENTIAL/PLATELET
Basophils Absolute: 0.1 10*3/uL (ref 0.0–0.1)
Basophils Relative: 1 % (ref 0–1)
Eosinophils Absolute: 0.3 10*3/uL (ref 0.0–0.7)
Eosinophils Relative: 6 % — ABNORMAL HIGH (ref 0–5)
HCT: 37.9 % (ref 36.0–46.0)
HEMOGLOBIN: 12.6 g/dL (ref 12.0–15.0)
LYMPHS ABS: 1.6 10*3/uL (ref 0.7–4.0)
LYMPHS PCT: 33 % (ref 12–46)
MCH: 28.6 pg (ref 26.0–34.0)
MCHC: 33.2 g/dL (ref 30.0–36.0)
MCV: 85.9 fL (ref 78.0–100.0)
MONOS PCT: 9 % (ref 3–12)
Monocytes Absolute: 0.4 10*3/uL (ref 0.1–1.0)
NEUTROS ABS: 2.4 10*3/uL (ref 1.7–7.7)
Neutrophils Relative %: 51 % (ref 43–77)
Platelets: 270 10*3/uL (ref 150–400)
RBC: 4.41 MIL/uL (ref 3.87–5.11)
RDW: 15.1 % (ref 11.5–15.5)
WBC: 4.7 10*3/uL (ref 4.0–10.5)

## 2013-09-28 LAB — COMPREHENSIVE METABOLIC PANEL
ALK PHOS: 121 U/L — AB (ref 39–117)
ALT: 12 U/L (ref 0–35)
AST: 16 U/L (ref 0–37)
Albumin: 3.6 g/dL (ref 3.5–5.2)
BILIRUBIN TOTAL: 0.2 mg/dL — AB (ref 0.3–1.2)
BUN: 11 mg/dL (ref 6–23)
CHLORIDE: 105 meq/L (ref 96–112)
CO2: 23 mEq/L (ref 19–32)
Calcium: 9.5 mg/dL (ref 8.4–10.5)
Creatinine, Ser: 0.59 mg/dL (ref 0.50–1.10)
GFR calc Af Amer: >90 mL/min (ref >90)
GFR calc non Af Amer: >90 mL/min (ref >90)
Glucose, Bld: 108 mg/dL — ABNORMAL HIGH (ref 70–99)
POTASSIUM: 4.3 meq/L (ref 3.7–5.3)
Sodium: 140 mEq/L (ref 137–147)
Total Protein: 7.4 g/dL (ref 6.0–8.3)

## 2013-10-02 ENCOUNTER — Telehealth: Payer: Self-pay | Admitting: Internal Medicine

## 2013-10-02 NOTE — Telephone Encounter (Signed)
Faxed over medical records to [facility] @ [phone] 

## 2013-10-03 ENCOUNTER — Ambulatory Visit (HOSPITAL_BASED_OUTPATIENT_CLINIC_OR_DEPARTMENT_OTHER)
Admission: RE | Admit: 2013-10-03 | Discharge: 2013-10-03 | Disposition: A | Discharge status: Home / Self Care | Payer: Medicare HMO | Source: Ambulatory Visit | Attending: Surgery | Admitting: Surgery

## 2013-10-03 ENCOUNTER — Encounter (HOSPITAL_BASED_OUTPATIENT_CLINIC_OR_DEPARTMENT_OTHER): Payer: Self-pay | Admitting: *Deleted

## 2013-10-03 ENCOUNTER — Encounter (HOSPITAL_BASED_OUTPATIENT_CLINIC_OR_DEPARTMENT_OTHER)
Admission: RE | Disposition: A | Discharge status: Home / Self Care | Payer: Self-pay | Source: Ambulatory Visit | Attending: Surgery

## 2013-10-03 ENCOUNTER — Encounter (HOSPITAL_BASED_OUTPATIENT_CLINIC_OR_DEPARTMENT_OTHER): Payer: Medicare HMO | Admitting: Anesthesiology

## 2013-10-03 ENCOUNTER — Ambulatory Visit (HOSPITAL_BASED_OUTPATIENT_CLINIC_OR_DEPARTMENT_OTHER): Payer: Medicare HMO | Admitting: Anesthesiology

## 2013-10-03 DIAGNOSIS — D249 Benign neoplasm of unspecified breast: Secondary | ICD-10-CM

## 2013-10-03 DIAGNOSIS — N6019 Diffuse cystic mastopathy of unspecified breast: Secondary | ICD-10-CM

## 2013-10-03 DIAGNOSIS — E7800 Pure hypercholesterolemia, unspecified: Secondary | ICD-10-CM | POA: Insufficient documentation

## 2013-10-03 DIAGNOSIS — Z87891 Personal history of nicotine dependence: Secondary | ICD-10-CM | POA: Insufficient documentation

## 2013-10-03 DIAGNOSIS — I2510 Atherosclerotic heart disease of native coronary artery without angina pectoris: Secondary | ICD-10-CM | POA: Insufficient documentation

## 2013-10-03 DIAGNOSIS — G4730 Sleep apnea, unspecified: Secondary | ICD-10-CM | POA: Insufficient documentation

## 2013-10-03 DIAGNOSIS — E119 Type 2 diabetes mellitus without complications: Secondary | ICD-10-CM | POA: Insufficient documentation

## 2013-10-03 DIAGNOSIS — N189 Chronic kidney disease, unspecified: Secondary | ICD-10-CM | POA: Insufficient documentation

## 2013-10-03 DIAGNOSIS — I129 Hypertensive chronic kidney disease with stage 1 through stage 4 chronic kidney disease, or unspecified chronic kidney disease: Secondary | ICD-10-CM | POA: Insufficient documentation

## 2013-10-03 DIAGNOSIS — N6320 Unspecified lump in the left breast, unspecified quadrant: Secondary | ICD-10-CM

## 2013-10-03 DIAGNOSIS — J4489 Other specified chronic obstructive pulmonary disease: Secondary | ICD-10-CM | POA: Insufficient documentation

## 2013-10-03 DIAGNOSIS — J449 Chronic obstructive pulmonary disease, unspecified: Secondary | ICD-10-CM | POA: Insufficient documentation

## 2013-10-03 DIAGNOSIS — M1710 Unilateral primary osteoarthritis, unspecified knee: Secondary | ICD-10-CM | POA: Insufficient documentation

## 2013-10-03 DIAGNOSIS — K219 Gastro-esophageal reflux disease without esophagitis: Secondary | ICD-10-CM | POA: Insufficient documentation

## 2013-10-03 HISTORY — DX: Presence of spectacles and contact lenses: Z97.3

## 2013-10-03 HISTORY — PX: BREAST LUMPECTOMY WITH RADIOACTIVE SEED LOCALIZATION: SHX6424

## 2013-10-03 HISTORY — DX: Presence of dental prosthetic device (complete) (partial): Z97.2

## 2013-10-03 HISTORY — DX: Complete loss of teeth, unspecified cause, unspecified class: K08.109

## 2013-10-03 LAB — GLUCOSE, CAPILLARY: Glucose-Capillary: 97 mg/dL (ref 70–99)

## 2013-10-03 SURGERY — BREAST LUMPECTOMY WITH RADIOACTIVE SEED LOCALIZATION
Anesthesia: General | Site: Breast | Laterality: Left

## 2013-10-03 MED ORDER — FENTANYL CITRATE 0.05 MG/ML IJ SOLN
25.0000 ug | INTRAMUSCULAR | Status: DC | PRN
Start: 2013-10-03 — End: 2013-10-03

## 2013-10-03 MED ORDER — MIDAZOLAM HCL 2 MG/2ML IJ SOLN: 1.0000 mg | INTRAMUSCULAR | Status: DC | PRN

## 2013-10-03 MED ORDER — ONDANSETRON HCL 4 MG/2ML IJ SOLN
INTRAMUSCULAR | Status: DC | PRN
  Administered 2013-10-03: 4 mg via INTRAVENOUS

## 2013-10-03 MED ORDER — MIDAZOLAM HCL 2 MG/2ML IJ SOLN
INTRAMUSCULAR | Status: AC
  Filled 2013-10-03: qty 2

## 2013-10-03 MED ORDER — LACTATED RINGERS IV SOLN
INTRAVENOUS | Status: DC
  Administered 2013-10-03: 12:00:00 via INTRAVENOUS

## 2013-10-03 MED ORDER — MIDAZOLAM HCL 5 MG/5ML IJ SOLN
INTRAMUSCULAR | Status: DC | PRN
  Administered 2013-10-03: 2 mg via INTRAVENOUS

## 2013-10-03 MED ORDER — PROMETHAZINE HCL 25 MG/ML IJ SOLN: 6.2500 mg | INTRAMUSCULAR | Status: DC | PRN

## 2013-10-03 MED ORDER — PROPOFOL 10 MG/ML IV BOLUS
INTRAVENOUS | Status: DC | PRN
  Administered 2013-10-03: 150 mg via INTRAVENOUS
  Administered 2013-10-03: 50 mg via INTRAVENOUS

## 2013-10-03 MED ORDER — FENTANYL CITRATE 0.05 MG/ML IJ SOLN
INTRAMUSCULAR | Status: AC
  Filled 2013-10-03: qty 6

## 2013-10-03 MED ORDER — OXYCODONE HCL 5 MG/5ML PO SOLN: 5.0000 mg | Freq: Once | ORAL | Status: DC | PRN

## 2013-10-03 MED ORDER — OXYCODONE-ACETAMINOPHEN 5-325 MG PO TABS: 1.0000 | ORAL_TABLET | ORAL | Status: DC | PRN

## 2013-10-03 MED ORDER — EPHEDRINE SULFATE 50 MG/ML IJ SOLN
INTRAMUSCULAR | Status: DC | PRN
  Administered 2013-10-03: 10 mg via INTRAVENOUS

## 2013-10-03 MED ORDER — SUCCINYLCHOLINE CHLORIDE 20 MG/ML IJ SOLN
INTRAMUSCULAR | Status: DC | PRN
  Administered 2013-10-03: 100 mg via INTRAVENOUS

## 2013-10-03 MED ORDER — OXYCODONE HCL 5 MG PO TABS: 5.0000 mg | ORAL_TABLET | Freq: Once | ORAL | Status: DC | PRN

## 2013-10-03 MED ORDER — CEFAZOLIN SODIUM-DEXTROSE 2-3 GM-% IV SOLR
INTRAVENOUS | Status: AC
  Filled 2013-10-03: qty 50

## 2013-10-03 MED ORDER — CEFAZOLIN SODIUM-DEXTROSE 2-3 GM-% IV SOLR
2.0000 g | INTRAVENOUS | Status: AC
  Administered 2013-10-03: 2 g via INTRAVENOUS

## 2013-10-03 MED ORDER — BUPIVACAINE-EPINEPHRINE (PF) 0.25% -1:200000 IJ SOLN
INTRAMUSCULAR | Status: DC | PRN
  Administered 2013-10-03: 10 mL

## 2013-10-03 MED ORDER — DEXAMETHASONE SODIUM PHOSPHATE 4 MG/ML IJ SOLN
INTRAMUSCULAR | Status: DC | PRN
  Administered 2013-10-03: 5 mg via INTRAVENOUS

## 2013-10-03 MED ORDER — CHLORHEXIDINE GLUCONATE 4 % EX LIQD: 1.0000 "application " | Freq: Once | CUTANEOUS | Status: DC

## 2013-10-03 MED ORDER — BUPIVACAINE-EPINEPHRINE (PF) 0.25% -1:200000 IJ SOLN
INTRAMUSCULAR | Status: AC
  Filled 2013-10-03: qty 30

## 2013-10-03 MED ORDER — MEPERIDINE HCL 25 MG/ML IJ SOLN: 6.2500 mg | INTRAMUSCULAR | Status: DC | PRN

## 2013-10-03 MED ORDER — FENTANYL CITRATE 0.05 MG/ML IJ SOLN: 50.0000 ug | INTRAMUSCULAR | Status: DC | PRN

## 2013-10-03 MED ORDER — FENTANYL CITRATE 0.05 MG/ML IJ SOLN
INTRAMUSCULAR | Status: DC | PRN
  Administered 2013-10-03 (×2): 50 ug via INTRAVENOUS

## 2013-10-03 SURGICAL SUPPLY — 56 items
ADH SKN CLS APL DERMABOND .7 (GAUZE/BANDAGES/DRESSINGS) ×1
APPLIER CLIP 9.375 MED OPEN (MISCELLANEOUS)
APR CLP MED 9.3 20 MLT OPN (MISCELLANEOUS)
BINDER BREAST LRG (GAUZE/BANDAGES/DRESSINGS) IMPLANT
BINDER BREAST MEDIUM (GAUZE/BANDAGES/DRESSINGS) IMPLANT
BINDER BREAST XLRG (GAUZE/BANDAGES/DRESSINGS) IMPLANT
BINDER BREAST XXLRG (GAUZE/BANDAGES/DRESSINGS) IMPLANT
BLADE 15 SAFETY STRL DISP (BLADE) ×2 IMPLANT
CANISTER SUC SOCK COL 7IN (MISCELLANEOUS) ×2 IMPLANT
CANISTER SUCT 1200ML W/VALVE (MISCELLANEOUS) ×2 IMPLANT
CHLORAPREP W/TINT 26ML (MISCELLANEOUS) ×2 IMPLANT
CLIP APPLIE 9.375 MED OPEN (MISCELLANEOUS) IMPLANT
CLIP TI WIDE RED SMALL 6 (CLIP) ×2 IMPLANT
COVER MAYO STAND STRL (DRAPES) ×2 IMPLANT
COVER PROBE W GEL 5X96 (DRAPES) ×2 IMPLANT
COVER TABLE BACK 60X90 (DRAPES) ×2 IMPLANT
DECANTER SPIKE VIAL GLASS SM (MISCELLANEOUS) IMPLANT
DERMABOND ADVANCED (GAUZE/BANDAGES/DRESSINGS) ×1
DERMABOND ADVANCED .7 DNX12 (GAUZE/BANDAGES/DRESSINGS) ×1 IMPLANT
DEVICE DUBIN W/COMP PLATE 8390 (MISCELLANEOUS) ×2 IMPLANT
DRAPE LAPAROSCOPIC ABDOMINAL (DRAPES) IMPLANT
DRAPE PED LAPAROTOMY (DRAPES) ×2 IMPLANT
DRAPE UTILITY XL STRL (DRAPES) ×2 IMPLANT
ELECT COATED BLADE 2.86 ST (ELECTRODE) ×2 IMPLANT
ELECT REM PT RETURN 9FT ADLT (ELECTROSURGICAL) ×2
ELECTRODE REM PT RTRN 9FT ADLT (ELECTROSURGICAL) ×1 IMPLANT
GLOVE BIO SURGEON STRL SZ7.5 (GLOVE) ×1 IMPLANT
GLOVE BIOGEL PI IND STRL 7.5 (GLOVE) IMPLANT
GLOVE BIOGEL PI IND STRL 8 (GLOVE) ×1 IMPLANT
GLOVE BIOGEL PI INDICATOR 7.5 (GLOVE) ×1
GLOVE BIOGEL PI INDICATOR 8 (GLOVE) ×2
GLOVE ECLIPSE 8.0 STRL XLNG CF (GLOVE) ×2 IMPLANT
GLOVE EXAM NITRILE LRG STRL (GLOVE) ×1 IMPLANT
GOWN STRL REUS W/ TWL LRG LVL3 (GOWN DISPOSABLE) ×2 IMPLANT
GOWN STRL REUS W/ TWL XL LVL3 (GOWN DISPOSABLE) IMPLANT
GOWN STRL REUS W/TWL LRG LVL3 (GOWN DISPOSABLE) ×4
GOWN STRL REUS W/TWL XL LVL3 (GOWN DISPOSABLE) ×2
KIT MARKER MARGIN INK (KITS) ×2 IMPLANT
NDL HYPO 25X1 1.5 SAFETY (NEEDLE) IMPLANT
NEEDLE HYPO 25X1 1.5 SAFETY (NEEDLE) ×2 IMPLANT
NS IRRIG 1000ML POUR BTL (IV SOLUTION) ×2 IMPLANT
PACK BASIN DAY SURGERY FS (CUSTOM PROCEDURE TRAY) ×2 IMPLANT
PENCIL BUTTON HOLSTER BLD 10FT (ELECTRODE) ×2 IMPLANT
PIN SAFETY STERILE (MISCELLANEOUS) IMPLANT
SLEEVE SCD COMPRESS KNEE MED (MISCELLANEOUS) ×2 IMPLANT
SPONGE LAP 4X18 X RAY DECT (DISPOSABLE) ×2 IMPLANT
STAPLER VISISTAT 35W (STAPLE) IMPLANT
SUT MNCRL AB 4-0 PS2 18 (SUTURE) ×2 IMPLANT
SUT SILK 2 0 SH (SUTURE) IMPLANT
SUT VIC AB 3-0 SH 27 (SUTURE) ×2
SUT VIC AB 3-0 SH 27X BRD (SUTURE) ×1 IMPLANT
SYR CONTROL 10ML LL (SYRINGE) ×1 IMPLANT
TOWEL OR 17X24 6PK STRL BLUE (TOWEL DISPOSABLE) ×2 IMPLANT
TOWEL OR NON WOVEN STRL DISP B (DISPOSABLE) ×2 IMPLANT
TUBE CONNECTING 20X1/4 (TUBING) ×1 IMPLANT
YANKAUER SUCT BULB TIP NO VENT (SUCTIONS) ×1 IMPLANT

## 2013-10-03 NOTE — Anesthesia Procedure Notes (Signed)
Procedure Name: Intubation Date/Time: 10/03/2013 12:16 PM Performed by: Maryella Shivers Pre-anesthesia Checklist: Patient identified, Emergency Drugs available, Suction available and Patient being monitored Patient Re-evaluated:Patient Re-evaluated prior to inductionOxygen Delivery Method: Circle System Utilized Preoxygenation: Pre-oxygenation with 100% oxygen Intubation Type: IV induction Ventilation: Mask ventilation without difficulty Laryngoscope Size: Mac and 3 Grade View: Grade I Tube type: Oral Tube size: 7.0 mm Number of attempts: 1 Airway Equipment and Method: stylet and oral airway Placement Confirmation: ETT inserted through vocal cords under direct vision,  positive ETCO2 and breath sounds checked- equal and bilateral Secured at: 21 cm Tube secured with: Tape Dental Injury: Teeth and Oropharynx as per pre-operative assessment

## 2013-10-03 NOTE — Anesthesia Preprocedure Evaluation (Signed)
Anesthesia Evaluation  Patient identified by MRN, date of birth, ID band Patient awake    Reviewed: Allergy & Precautions, H&P , NPO status , Patient's Chart, lab work & pertinent test results, reviewed documented beta blocker date and time   History of Anesthesia Complications Negative for: history of anesthetic complications  Airway Mallampati: I  Neck ROM: Full    Dental  (+) Edentulous Upper, Edentulous Lower   Pulmonary asthma , sleep apnea and Continuous Positive Airway Pressure Ventilation , COPD COPD inhaler, former smoker,  breath sounds clear to auscultation  Pulmonary exam normal       Cardiovascular hypertension, Pt. on medications and Pt. on home beta blockers - angina+ CAD Rhythm:Regular Rate:Normal  3/15 stress test: no ischemia, normal perfusion, EF 65%   Neuro/Psych negative neurological ROS     GI/Hepatic Neg liver ROS, GERD-  Poorly Controlled,  Endo/Other  diabetes (diet management)Morbid obesity  Renal/GU negative Renal ROS     Musculoskeletal   Abdominal (+) + obese,   Peds  Hematology   Anesthesia Other Findings   Reproductive/Obstetrics                           Anesthesia Physical Anesthesia Plan  ASA: III  Anesthesia Plan: General   Post-op Pain Management:    Induction: Intravenous  Airway Management Planned: Oral ETT  Additional Equipment:   Intra-op Plan:   Post-operative Plan: Extubation in OR  Informed Consent: I have reviewed the patients History and Physical, chart, labs and discussed the procedure including the risks, benefits and alternatives for the proposed anesthesia with the patient or authorized representative who has indicated his/her understanding and acceptance.   Dental advisory given  Plan Discussed with: CRNA and Surgeon  Anesthesia Plan Comments: (Plan routine monitors, GETA)        Anesthesia Quick Evaluation

## 2013-10-03 NOTE — Transfer of Care (Signed)
Immediate Anesthesia Transfer of Care Note  Patient: Sandra Lawrence  Procedure(s) Performed: Procedure(s): BREAST LUMPECTOMY WITH RADIOACTIVE SEED LOCALIZATION (Left)  Patient Location: PACU  Anesthesia Type:General  Level of Consciousness: awake, alert  and oriented  Airway & Oxygen Therapy: Patient Spontanous Breathing and Patient connected to face mask oxygen  Post-op Assessment: Report given to PACU RN and Post -op Vital signs reviewed and stable  Post vital signs: Reviewed and stable  Complications: No apparent anesthesia complications

## 2013-10-03 NOTE — Interval H&P Note (Signed)
History and Physical Interval Note:  10/03/2013 11:42 AM  Sandra Lawrence  has presented today for surgery, with the diagnosis of left breast mass  The various methods of treatment have been discussed with the patient and family. After consideration of risks, benefits and other options for treatment, the patient has consented to  Procedure(s): BREAST LUMPECTOMY WITH RADIOACTIVE SEED LOCALIZATION (Left) as a surgical intervention .  The patient's history has been reviewed, patient examined, no change in status, stable for surgery.  I have reviewed the patient's chart and labs.  Questions were answered to the patient's satisfaction.     Deajah Erkkila A. Caylei Sperry

## 2013-10-03 NOTE — Anesthesia Postprocedure Evaluation (Signed)
  Anesthesia Post-op Note  Patient: Sandra Lawrence  Procedure(s) Performed: Procedure(s): BREAST LUMPECTOMY WITH RADIOACTIVE SEED LOCALIZATION (Left)  Patient Location: PACU  Anesthesia Type:General  Level of Consciousness: awake, alert , oriented and patient cooperative  Airway and Oxygen Therapy: Patient Spontanous Breathing  Post-op Pain: none  Post-op Assessment: Post-op Vital signs reviewed, Patient's Cardiovascular Status Stable, Respiratory Function Stable, Patent Airway, No signs of Nausea or vomiting and Pain level controlled  Post-op Vital Signs: Reviewed and stable  Last Vitals:  Filed Vitals:   10/03/13 1355  BP: 168/96  Pulse: 58  Temp: 36.6 C  Resp: 16    Complications: No apparent anesthesia complications

## 2013-10-03 NOTE — Discharge Instructions (Signed)
[facility] °Office Phone Number [phone] ° °BREAST BIOPSY/ PARTIAL MASTECTOMY: POST OP INSTRUCTIONS ° °Always review your discharge instruction sheet given to you by the facility where your surgery was performed. ° °IF YOU HAVE DISABILITY OR FAMILY LEAVE FORMS, YOU MUST BRING THEM TO THE OFFICE FOR PROCESSING.  DO NOT GIVE THEM TO YOUR DOCTOR. ° °1. A prescription for pain medication may be given to you upon discharge.  Take your pain medication as prescribed, if needed.  If narcotic pain medicine is not needed, then you may take acetaminophen (Tylenol) or ibuprofen (Advil) as needed. °2. Take your usually prescribed medications unless otherwise directed °3. If you need a refill on your pain medication, please contact your pharmacy.  They will contact our office to request authorization.  Prescriptions will not be filled after 5pm or on week-ends. °4. You should eat very light the first 24 hours after surgery, such as soup, crackers, pudding, etc.  Resume your normal diet the day after surgery. °5. Most patients will experience some swelling and bruising in the breast.  Ice packs and a good support bra will help.  Swelling and bruising can take several days to resolve.  °6. It is common to experience some constipation if taking pain medication after surgery.  Increasing fluid intake and taking a stool softener will usually help or prevent this problem from occurring.  A mild laxative (Milk of Magnesia or Miralax) should be taken according to package directions if there are no bowel movements after 48 hours. °7. Unless discharge instructions indicate otherwise, you may remove your bandages 24-48 hours after surgery, and you may shower at that time.  You may have steri-strips (small skin tapes) in place directly over the incision.  These strips should be left on the skin for 7-10 days.  If your surgeon used skin glue on the incision, you may shower in 24 hours.  The glue will flake off over the  next 2-3 weeks.  Any sutures or staples will be removed at the office during your follow-up visit. °8. ACTIVITIES:  You may resume regular daily activities (gradually increasing) beginning the next day.  Wearing a good support bra or sports bra minimizes pain and swelling.  You may have sexual intercourse when it is comfortable. °a. You may drive when you no longer are taking prescription pain medication, you can comfortably wear a seatbelt, and you can safely maneuver your car and apply brakes. °b. RETURN TO WORK:  ______________________________________________________________________________________ °9. You should see your doctor in the office for a follow-up appointment approximately two weeks after your surgery.  Your doctor’s nurse will typically make your follow-up appointment when she calls you with your pathology report.  Expect your pathology report 2-3 business days after your surgery.  You may call to check if you do not hear from us after three days. °10. OTHER INSTRUCTIONS: _______________________________________________________________________________________________ _____________________________________________________________________________________________________________________________________ °_____________________________________________________________________________________________________________________________________ °_____________________________________________________________________________________________________________________________________ ° °WHEN TO CALL YOUR DOCTOR: °1. Fever over 101.0 °2. Nausea and/or vomiting. °3. Extreme swelling or bruising. °4. Continued bleeding from incision. °5. Increased pain, redness, or drainage from the incision. ° °The clinic staff is available to answer your questions during regular business hours.  Please don’t hesitate to call and ask to speak to one of the nurses for clinical concerns.  If you have a medical emergency, go to the nearest  emergency room or call 911.  A surgeon from [facility] is always on call at the hospital. ° °For further questions, please visit [email or web address]  ° ° °  Post Anesthesia Home Care Instructions ° °Activity: °Get plenty of rest for the remainder of the day. A responsible adult should stay with you for 24 hours following the procedure.  °For the next 24 hours, DO NOT: °-Drive a car °-Operate machinery °-Drink alcoholic beverages °-Take any medication unless instructed by your physician °-Make any legal decisions or sign important papers. ° °Meals: °Start with liquid foods such as gelatin or soup. Progress to regular foods as tolerated. Avoid greasy, spicy, heavy foods. If nausea and/or vomiting occur, drink only clear liquids until the nausea and/or vomiting subsides. Call your physician if vomiting continues. ° °Special Instructions/Symptoms: °Your throat may feel dry or sore from the anesthesia or the breathing tube placed in your throat during surgery. If this causes discomfort, gargle with warm salt water. The discomfort should disappear within 24 hours. ° °

## 2013-10-03 NOTE — H&P (View-Only) (Signed)
Patient ID: Sandra Lawrence, female   DOB: 1945/10/27, 68 y.o.   MRN: [id number]  Chief Complaint  Patient presents with  . New Evaluation    eval Lt sclerosed papillary lesion    HPI Sandra Lawrence is a 68 y.o. female.   HPI Patient sent at Request of Denita Lung, MD and [facility] for sclerosing lesion left breast found on mammogram,  U/S and core biopsy.  No symptoms of breast pain,  Discharge or mass.    Past Medical History  Diagnosis Date  . Hypertensive heart disease without congestive heart failure   . Obesity   . Degenerative joint disease     BOTH KNEES  . PVC (premature ventricular contraction)   . Palpitations   . Asthma   . Hypercholesteremia   . Fatty tumor   . CAD (coronary artery disease) 02/15/2007  . Hyperlipidemia 04/17/2011  . Anxiety   . Allergy   . Sleep apnea     wears CPAP  . Blood transfusion without reported diagnosis   . Depression   . Diabetes mellitus     no medicines  . Diabetes mellitus type 2, noninsulin dependent   . GERD (gastroesophageal reflux disease)   . Heart murmur   . Hypertension   . Chronic kidney disease     kidney stones  . Osteoporosis   . Bursitis     right hip    Past Surgical History  Procedure Laterality Date  . Hemorroidectomy    . Cesarean section    . Replacement total knee  left knee    Family History  Problem Relation Age of Onset  . Cancer Father   . Diabetes Mother   . Colon cancer Neg Hx   . Esophageal cancer Neg Hx   . Stomach cancer Neg Hx   . Rectal cancer Neg Hx     Social History History  Substance Use Topics  . Smoking status: Former Smoker    Quit date: 11/10/1979  . Smokeless tobacco: Never Used  . Alcohol Use: Yes     Comment: occasionally    No Known Allergies  Current Outpatient Prescriptions  Medication Sig Dispense Refill  . aspirin EC 325 MG tablet Take 325 mg by mouth daily.        . Aspirin-Salicylamide-Caffeine (BC HEADACHE POWDER PO) Take 1 packet by mouth daily as needed  (for pain).      Marland Kitchen beclomethasone (QVAR) 80 MCG/ACT inhaler Inhale 2 puffs into the lungs 2 (two) times daily as needed (for shortness of breath).      . hydrochlorothiazide (MICROZIDE) 12.5 MG capsule Take 1 capsule (12.5 mg total) by mouth daily. PATIENT NEEDS A FOLLOW UP VISIT FOR BLOOD PRESSURE  30 capsule  0  . lisinopril (PRINIVIL,ZESTRIL) 40 MG tablet TAKE 1 TABLET BY MOUTH EVERY DAY FOR HYPERTENSION  90 tablet  0  . LORazepam (ATIVAN) 1 MG tablet Take 1 mg by mouth every 8 (eight) hours as needed for anxiety.      . metoprolol tartrate (LOPRESSOR) 25 MG tablet Take metoprolol 12.5 mg (split 25mg  tab to half) oral BID (twice a day).  90 tablet  3  . metoprolol tartrate (LOPRESSOR) 25 MG tablet Take metoprolol 12.5 mg (split 25mg  tab to half) oral BID (twice a day).  30 tablet  3  . naproxen sodium (ANAPROX) 220 MG tablet Take 440 mg by mouth 2 (two) times daily as needed (for pain).      . nitroGLYCERIN (NITROSTAT)  0.4 MG SL tablet Place 0.4 mg under the tongue every 5 (five) minutes as needed for chest pain.       Marland Kitchen orlistat (XENICAL) 120 MG capsule Take 120 mg by mouth 3 (three) times daily with meals.      . pravastatin (PRAVACHOL) 40 MG tablet TAKE 1 TABLET (40 MG TOTAL) BY MOUTH DAILY.  90 tablet  0  . traMADol (ULTRAM) 50 MG tablet TAKE 1 TABLET EVERY 6 HOURS AS NEEDED FOR MODERATE PAIN  50 tablet  1   No current facility-administered medications for this visit.    Review of Systems Review of Systems  Constitutional: Negative for fever, chills and unexpected weight change.  HENT: Negative for congestion, hearing loss, sore throat, trouble swallowing and voice change.   Eyes: Negative for visual disturbance.  Respiratory: Negative for cough and wheezing.   Cardiovascular: Negative for chest pain, palpitations and leg swelling.  Gastrointestinal: Negative for nausea, vomiting, abdominal pain, diarrhea, constipation, blood in stool, abdominal distention and anal bleeding.   Genitourinary: Negative for hematuria, vaginal bleeding and difficulty urinating.  Musculoskeletal: Negative for arthralgias.  Skin: Negative for rash and wound.  Neurological: Negative for seizures, syncope and headaches.  Hematological: Negative for adenopathy. Does not bruise/bleed easily.  Psychiatric/Behavioral: Negative for confusion.    Blood pressure 138/84, pulse 80, temperature 97.2 F (36.2 C), temperature source Temporal, resp. rate 14, height 5' 6.5" (1.689 m), weight 258 lb (117.028 kg).  Physical Exam Physical Exam  Constitutional: She is oriented to person, place, and time. She appears well-developed and well-nourished.  HENT:  Head: Normocephalic and atraumatic.  Eyes: Pupils are equal, round, and reactive to light. No scleral icterus.  Neck: Normal range of motion.  Pulmonary/Chest: Effort normal and breath sounds normal. Right breast exhibits no inverted nipple, no mass, no nipple discharge, no skin change and no tenderness. Left breast exhibits no inverted nipple, no mass, no nipple discharge, no skin change and no tenderness. Breasts are symmetrical.  Musculoskeletal: Normal range of motion.  Lymphadenopathy:    She has no cervical adenopathy.  Neurological: She is alert and oriented to person, place, and time.  Skin: Skin is warm and dry.  Psychiatric: She has a normal mood and affect. Her behavior is normal. Judgment and thought content normal.    Data Reviewed Breast, left, needle core biopsy, 1:00 - SCLEROSED PAPILLARY LESION, SEE COMMENT  Assessment    Left breast mas/  Sclerosing lesion central    Plan    Recommend left breast seed  Localized lumpectomy. Operative and nonoperative management strategies discussed. Risk of malignancy the situation is about 3%. She would like to proceed for excision.The procedure has been discussed with the patient. Alternatives to surgery have been discussed with the patient.  Risks of surgery include bleeding,   Infection,  Seroma formation, death,  and the need for further surgery.   The patient understands and wishes to proceed.       Eduard Penkala A. Hanah Moultry 09/10/2013, 10:02 AM

## 2013-10-03 NOTE — Op Note (Signed)
Preoperative diagnosis: Left breast papilloma  Postoperative diagnosis: Same   Procedure: Left breast seed localized lumpectomy  Surgeon: Erroll Luna M.D.  Anesthesia: Gen. With 0.25% Sensorcaine local  EBL: 20 cc  Specimen: Left breast tissue with clip and radioactive seed in the specimen. Verified with neoprobe and radiographic image showing both seed and clip in specimen  Indications for procedure: The patient presents for left breast excisional lumpectomy after core biopsy showed papilloma. Discussed the rationale for considering excision. Small risk of malignancy associated with papilloma lesion after core biopsy. Discussed observation. Discussed wire localization. Patient desired excision of left breast papilloma.The procedure has been discussed with the patient. Alternatives to surgery have been discussed with the patient.  Risks of surgery include bleeding,  Infection,  Seroma formation, death,  and the need for further surgery.   The patient understands and wishes to proceed.   Description of procedure: Patient underwent seed placement as an outpatient. Patient presents today for left breast seed localized lumpectomy. Patient and holding area. Questions are answered and neoprobe used to verify seed location. Patient taken back to the operating room and placed upon the OR table. After induction of general anesthesia, left breast prepped and draped in a sterile fashion. Timeout was done to verify proper sizing procedure. Neoprobe used and hot spot identified and left breast upper-outer quadrant. This was marked with pen. Curvilinear incision made left upper outer quadrant breast. Dissection used with the help of a neoprobe around the tissue where the seed and clip were located. Tissue removed in its entirety with gross margins.Sandra Lawrence used and seen within specimen. Radiographs taken which show clip and seed  In specimen.hemostasis achieved and cavity closed with 3-0 Vicryl and 4-0  Monocryl. Dermabond applied. All final counts found to be correct. Specimen transported to pathology. Patient awoke extubated taken to recovery in satisfactory condition.

## 2013-10-04 ENCOUNTER — Encounter (HOSPITAL_BASED_OUTPATIENT_CLINIC_OR_DEPARTMENT_OTHER): Payer: Self-pay | Admitting: Surgery

## 2013-10-10 ENCOUNTER — Telehealth (INDEPENDENT_AMBULATORY_CARE_PROVIDER_SITE_OTHER): Payer: Self-pay

## 2013-10-10 NOTE — Telephone Encounter (Signed)
Called pt with benign path. 

## 2013-10-10 NOTE — Telephone Encounter (Signed)
Message copied by Carlene Coria on Wed Oct 10, 2013  9:40 AM ------      Message from: Erroll Luna A      Created: Tue Oct 09, 2013 12:17 PM       b9 ------

## 2013-10-12 ENCOUNTER — Encounter (INDEPENDENT_AMBULATORY_CARE_PROVIDER_SITE_OTHER): Payer: Self-pay

## 2013-10-14 ENCOUNTER — Encounter (HOSPITAL_COMMUNITY): Payer: Self-pay | Admitting: Emergency Medicine

## 2013-10-14 ENCOUNTER — Emergency Department (HOSPITAL_COMMUNITY)
Admission: EM | Admit: 2013-10-14 | Discharge: 2013-10-14 | Disposition: A | Discharge status: Home / Self Care | Payer: No Typology Code available for payment source | Attending: Emergency Medicine | Admitting: Emergency Medicine

## 2013-10-14 ENCOUNTER — Emergency Department (HOSPITAL_COMMUNITY): Payer: No Typology Code available for payment source

## 2013-10-14 DIAGNOSIS — I519 Heart disease, unspecified: Secondary | ICD-10-CM | POA: Insufficient documentation

## 2013-10-14 DIAGNOSIS — Z8739 Personal history of other diseases of the musculoskeletal system and connective tissue: Secondary | ICD-10-CM | POA: Insufficient documentation

## 2013-10-14 DIAGNOSIS — S3981XA Other specified injuries of abdomen, initial encounter: Secondary | ICD-10-CM | POA: Insufficient documentation

## 2013-10-14 DIAGNOSIS — Z79899 Other long term (current) drug therapy: Secondary | ICD-10-CM | POA: Insufficient documentation

## 2013-10-14 DIAGNOSIS — Z8659 Personal history of other mental and behavioral disorders: Secondary | ICD-10-CM | POA: Insufficient documentation

## 2013-10-14 DIAGNOSIS — Y9389 Activity, other specified: Secondary | ICD-10-CM | POA: Insufficient documentation

## 2013-10-14 DIAGNOSIS — Y92410 Unspecified street and highway as the place of occurrence of the external cause: Secondary | ICD-10-CM | POA: Insufficient documentation

## 2013-10-14 DIAGNOSIS — Z8719 Personal history of other diseases of the digestive system: Secondary | ICD-10-CM | POA: Insufficient documentation

## 2013-10-14 DIAGNOSIS — I1310 Hypertensive heart and chronic kidney disease without heart failure, with stage 1 through stage 4 chronic kidney disease, or unspecified chronic kidney disease: Secondary | ICD-10-CM | POA: Insufficient documentation

## 2013-10-14 DIAGNOSIS — Z87442 Personal history of urinary calculi: Secondary | ICD-10-CM | POA: Insufficient documentation

## 2013-10-14 DIAGNOSIS — E669 Obesity, unspecified: Secondary | ICD-10-CM | POA: Insufficient documentation

## 2013-10-14 DIAGNOSIS — E119 Type 2 diabetes mellitus without complications: Secondary | ICD-10-CM | POA: Insufficient documentation

## 2013-10-14 DIAGNOSIS — R011 Cardiac murmur, unspecified: Secondary | ICD-10-CM | POA: Insufficient documentation

## 2013-10-14 DIAGNOSIS — N189 Chronic kidney disease, unspecified: Secondary | ICD-10-CM | POA: Insufficient documentation

## 2013-10-14 DIAGNOSIS — J45909 Unspecified asthma, uncomplicated: Secondary | ICD-10-CM | POA: Insufficient documentation

## 2013-10-14 DIAGNOSIS — I2510 Atherosclerotic heart disease of native coronary artery without angina pectoris: Secondary | ICD-10-CM | POA: Insufficient documentation

## 2013-10-14 DIAGNOSIS — E785 Hyperlipidemia, unspecified: Secondary | ICD-10-CM | POA: Insufficient documentation

## 2013-10-14 DIAGNOSIS — G4730 Sleep apnea, unspecified: Secondary | ICD-10-CM | POA: Insufficient documentation

## 2013-10-14 DIAGNOSIS — R079 Chest pain, unspecified: Secondary | ICD-10-CM

## 2013-10-14 DIAGNOSIS — Z87891 Personal history of nicotine dependence: Secondary | ICD-10-CM | POA: Insufficient documentation

## 2013-10-14 DIAGNOSIS — S298XXA Other specified injuries of thorax, initial encounter: Secondary | ICD-10-CM | POA: Insufficient documentation

## 2013-10-14 DIAGNOSIS — Z9889 Other specified postprocedural states: Secondary | ICD-10-CM | POA: Insufficient documentation

## 2013-10-14 LAB — COMPREHENSIVE METABOLIC PANEL
ALBUMIN: 3.6 g/dL (ref 3.5–5.2)
ALT: 12 U/L (ref 0–35)
AST: 15 U/L (ref 0–37)
Alkaline Phosphatase: 122 U/L — ABNORMAL HIGH (ref 39–117)
BILIRUBIN TOTAL: 0.3 mg/dL (ref 0.3–1.2)
BUN: 10 mg/dL (ref 6–23)
CALCIUM: 9.5 mg/dL (ref 8.4–10.5)
CO2: 25 mEq/L (ref 19–32)
CREATININE: 0.68 mg/dL (ref 0.50–1.10)
Chloride: 104 mEq/L (ref 96–112)
GFR calc Af Amer: >90 mL/min (ref >90)
GFR calc non Af Amer: 90 mL/min — ABNORMAL LOW (ref >90)
Glucose, Bld: 110 mg/dL — ABNORMAL HIGH (ref 70–99)
Potassium: 3.5 mEq/L — ABNORMAL LOW (ref 3.7–5.3)
Sodium: 142 mEq/L (ref 137–147)
TOTAL PROTEIN: 7.3 g/dL (ref 6.0–8.3)

## 2013-10-14 LAB — I-STAT CG4 LACTIC ACID, ED: LACTIC ACID, VENOUS: 0.64 mmol/L (ref 0.5–2.2)

## 2013-10-14 LAB — CBC
HEMATOCRIT: 36.7 % (ref 36.0–46.0)
Hemoglobin: 12 g/dL (ref 12.0–15.0)
MCH: 28.4 pg (ref 26.0–34.0)
MCHC: 32.7 g/dL (ref 30.0–36.0)
MCV: 87 fL (ref 78.0–100.0)
PLATELETS: 258 10*3/uL (ref 150–400)
RBC: 4.22 MIL/uL (ref 3.87–5.11)
RDW: 15.2 % (ref 11.5–15.5)
WBC: 8.6 10*3/uL (ref 4.0–10.5)

## 2013-10-14 LAB — LIPASE, BLOOD: Lipase: 9 U/L — ABNORMAL LOW (ref 11–59)

## 2013-10-14 MED ORDER — HYDROCODONE-ACETAMINOPHEN 5-325 MG PO TABS: 1.0000 | ORAL_TABLET | Freq: Four times a day (QID) | ORAL | Status: DC | PRN

## 2013-10-14 MED ORDER — MORPHINE SULFATE 4 MG/ML IJ SOLN
4.0000 mg | Freq: Once | INTRAMUSCULAR | Status: AC
  Administered 2013-10-14: 4 mg via INTRAVENOUS
  Filled 2013-10-14: qty 1

## 2013-10-14 MED ORDER — IOHEXOL 300 MG/ML  SOLN
100.0000 mL | Freq: Once | INTRAMUSCULAR | Status: AC | PRN
  Administered 2013-10-14: 100 mL via INTRAVENOUS

## 2013-10-14 MED ORDER — HYDROMORPHONE HCL PF 1 MG/ML IJ SOLN
1.0000 mg | Freq: Once | INTRAMUSCULAR | Status: AC
  Administered 2013-10-14: 1 mg via INTRAVENOUS
  Filled 2013-10-14: qty 1

## 2013-10-14 MED ORDER — SODIUM CHLORIDE 0.9 % IV BOLUS (SEPSIS)
500.0000 mL | Freq: Once | INTRAVENOUS | Status: AC
  Administered 2013-10-14: 500 mL via INTRAVENOUS
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
## 2013-10-14 NOTE — ED Notes (Signed)
Pt requesting more pain medicine. Dr. Mingo Amber made aware.

## 2013-10-14 NOTE — ED Notes (Signed)
I STAT Lactic Acid results shown to Dr. Julious Oka
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
## 2013-10-15 ENCOUNTER — Encounter (INDEPENDENT_AMBULATORY_CARE_PROVIDER_SITE_OTHER): Payer: Medicare HMO | Admitting: Surgery

## 2013-10-16 ENCOUNTER — Telehealth: Payer: Self-pay | Admitting: Family Medicine

## 2013-10-16 NOTE — Telephone Encounter (Signed)
Medical records released to Disability

## 2013-10-24 DIAGNOSIS — Z029 Encounter for administrative examinations, unspecified: Secondary | ICD-10-CM

## 2013-10-31 ENCOUNTER — Ambulatory Visit (INDEPENDENT_AMBULATORY_CARE_PROVIDER_SITE_OTHER): Payer: Medicare PPO | Admitting: Family Medicine

## 2013-10-31 ENCOUNTER — Encounter: Payer: Self-pay | Admitting: Family Medicine

## 2013-10-31 VITALS — BP 130/80 | Wt 251.0 lb

## 2013-10-31 DIAGNOSIS — S20219A Contusion of unspecified front wall of thorax, initial encounter: Secondary | ICD-10-CM
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
## 2013-10-31 NOTE — Patient Instructions (Signed)
Take Tylenol regularly for the next week. Use a pillow to help with cough or sneezing

## 2013-11-13 ENCOUNTER — Encounter (INDEPENDENT_AMBULATORY_CARE_PROVIDER_SITE_OTHER): Payer: Self-pay

## 2013-11-13 ENCOUNTER — Telehealth: Payer: Self-pay | Admitting: Family Medicine

## 2013-11-13 ENCOUNTER — Other Ambulatory Visit: Payer: Self-pay

## 2013-11-13 MED ORDER — TRAMADOL HCL 50 MG PO TABS: 50.0000 mg | ORAL_TABLET | Freq: Four times a day (QID) | ORAL | Status: DC | PRN

## 2013-11-13 NOTE — Telephone Encounter (Signed)
CALLED MED IN AND CALLED PT FOR APPOINTMENT

## 2013-11-13 NOTE — Telephone Encounter (Signed)
Have her schedule for an appointment to see me next week and call in more tramadol to cover her.
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
## 2013-11-13 NOTE — Telephone Encounter (Deleted)
CALLED

## 2013-11-24 ENCOUNTER — Other Ambulatory Visit: Payer: Self-pay | Admitting: Family Medicine

## 2013-11-26 NOTE — Telephone Encounter (Signed)
Is this okay to call in? 

## 2013-11-26 NOTE — Telephone Encounter (Signed)
She needs an appointment.

## 2013-11-29 ENCOUNTER — Other Ambulatory Visit: Payer: Self-pay | Admitting: Family Medicine

## 2013-12-10 ENCOUNTER — Ambulatory Visit: Payer: Medicare PPO | Admitting: Family Medicine

## 2013-12-17 ENCOUNTER — Telehealth: Payer: Self-pay | Admitting: Family Medicine

## 2013-12-17 NOTE — Telephone Encounter (Signed)
Pt wanted to make [place] aware that she tried to call us on 12/07/13 & 12/08/13 to cancel her appointment that was scheduled on 12/10/13 but she was calling after hours and was not able to talk to anyone and not able to leave a message

## 2013-12-18 NOTE — Telephone Encounter (Signed)
Left message for pt to call me back. Dr. Redmond School wants to see pt if she is wanting a knee orthosis with adjustable knee joint.

## 2013-12-18 NOTE — Telephone Encounter (Signed)
Pt states she will have to call back and schedule when she has some money for her co-pay

## 2013-12-18 NOTE — Telephone Encounter (Signed)
I meant Dr. Redmond School is wanting for pt to make an appt to discuss this with him

## 2013-12-19 ENCOUNTER — Encounter: Payer: Self-pay | Admitting: Family Medicine

## 2014-02-06 ENCOUNTER — Other Ambulatory Visit: Payer: Self-pay | Admitting: Family Medicine

## 2014-02-07 ENCOUNTER — Telehealth: Payer: Self-pay | Admitting: Internal Medicine

## 2014-02-07 NOTE — Telephone Encounter (Signed)
Pt does NOT need knee orthosis with adjustable knee joints from [facility]. She went yesterday to get a knee brace at [facility].

## 2014-05-07 ENCOUNTER — Ambulatory Visit (INDEPENDENT_AMBULATORY_CARE_PROVIDER_SITE_OTHER): Payer: Medicare PPO | Admitting: Family Medicine

## 2014-05-07 ENCOUNTER — Encounter: Payer: Self-pay | Admitting: Family Medicine

## 2014-05-07 ENCOUNTER — Telehealth: Payer: Self-pay | Admitting: Internal Medicine

## 2014-05-07 VITALS — BP 160/90 | HR 83

## 2014-05-07 DIAGNOSIS — Z6841 Body Mass Index (BMI) 40.0 and over, adult: Secondary | ICD-10-CM

## 2014-05-07 DIAGNOSIS — Z23 Encounter for immunization: Secondary | ICD-10-CM

## 2014-05-07 DIAGNOSIS — I25119 Atherosclerotic heart disease of native coronary artery with unspecified angina pectoris: Secondary | ICD-10-CM

## 2014-05-07 DIAGNOSIS — M1990 Unspecified osteoarthritis, unspecified site: Secondary | ICD-10-CM

## 2014-05-07 DIAGNOSIS — Z87898 Personal history of other specified conditions: Secondary | ICD-10-CM

## 2014-05-07 DIAGNOSIS — E119 Type 2 diabetes mellitus without complications: Secondary | ICD-10-CM

## 2014-05-07 DIAGNOSIS — I119 Hypertensive heart disease without heart failure: Secondary | ICD-10-CM

## 2014-05-07 MED ORDER — HYDROCHLOROTHIAZIDE 12.5 MG PO CAPS: ORAL_CAPSULE | ORAL | Status: DC

## 2014-05-07 MED ORDER — LISINOPRIL 40 MG PO TABS: 40.0000 mg | ORAL_TABLET | Freq: Every day | ORAL | Status: DC

## 2014-05-07 NOTE — Progress Notes (Signed)
   Subjective:    Patient ID: Sandra Lawrence, female    DOB: Jan 21, 1946, 68 y.o.   MRN: [id number]  HPI She is here for a preoperative evaluation. She is going to have right TKR. She has already had a left TKR. She has a history of diabetes however is not on any medications. In the past her hemoglobin A1c's have boys been in the low 6 range. She does not check her sugars regularly. She  ran out of her blood pressure medications 2 weeks ago. She did not call for a refill. She was seen in the emergency room and evaluated for chest pain in March. She did have testing done which did not show evidence of an MI. Since then she has had nitroglycerin but states she only use it once which was approximately 3 weeks ago and related to family stress. She does not complain of shortness of breath, DOE, PND. Her physical activity is quite limited due to the knee pain.   Review of Systems     Objective:   Physical Exam alert and in no distress. Tympanic membranes and canals are normal. Throat is clear. Tonsils are normal. Neck is supple without adenopathy or thyromegaly. Cardiac exam shows a regular sinus rhythm without murmurs or gallops. Lungs are clear to auscultation. EKG does show some T-wave inversions which are not new. One PVC was noted. Hemoglobin A1c 6.0      Assessment & Plan:  Need for prophylactic vaccination and inoculation against influenza - Plan: Flu vaccine HIGH DOSE PF (Fluzone Tri High dose)  Diabetes mellitus type 2, noninsulin dependent  History of chest pain - Plan: EKG 12-Lead, Ambulatory referral to Cardiology  Coronary artery disease involving native coronary artery of native heart with angina pectoris  Hypertensive heart disease without CHF - Plan: lisinopril (PRINIVIL,ZESTRIL) 40 MG tablet, hydrochlorothiazide (MICROZIDE) 12.5 MG capsule  Morbid obesity with BMI of 40.0-44.9, adult  Arthritis  she is to return here in 2 weeks for follow-up on her blood pressure. I will  also have her see cardiology for preoperative clearance.

## 2014-05-07 NOTE — Telephone Encounter (Signed)
Pt has an appt with Dr. Sallyanne Kuster on Monday January 4th @ 9:45am. Pt was given appt info and address when she was in office today. i was not sure if a Referral had to be done since pt was Sandra Lawrence so i did it anyways and faxed to [facility]. Dr. Sallyanne Kuster  NPI- [id number] Phone # [phone] Fax [phone] ICD-10- [phone]- History of Chest pain (Dr. Redmond School told me to use this) appt- January 4th @ 9:45am

## 2014-05-08 LAB — POCT GLYCOSYLATED HEMOGLOBIN (HGB A1C)

## 2014-05-08 NOTE — Addendum Note (Signed)
Addended by: Minette Headland A on: 05/08/2014 10:14 AM   Modules accepted: Orders

## 2014-05-17 DIAGNOSIS — K5900 Constipation, unspecified: Secondary | ICD-10-CM | POA: Insufficient documentation

## 2014-05-17 HISTORY — DX: Constipation, unspecified: K59.00

## 2014-05-20 ENCOUNTER — Encounter: Payer: Self-pay | Admitting: Cardiovascular Disease

## 2014-05-20 ENCOUNTER — Ambulatory Visit (INDEPENDENT_AMBULATORY_CARE_PROVIDER_SITE_OTHER): Payer: Medicare Other | Admitting: Cardiovascular Disease

## 2014-05-20 VITALS — BP 147/93 | HR 64 | Resp 20 | Ht 66.5 in | Wt 254.0 lb

## 2014-05-20 DIAGNOSIS — Z01810 Encounter for preprocedural cardiovascular examination: Secondary | ICD-10-CM

## 2014-05-20 DIAGNOSIS — I2510 Atherosclerotic heart disease of native coronary artery without angina pectoris: Secondary | ICD-10-CM | POA: Diagnosis not present

## 2014-05-20 DIAGNOSIS — I119 Hypertensive heart disease without heart failure: Secondary | ICD-10-CM

## 2014-05-20 DIAGNOSIS — Z6841 Body Mass Index (BMI) 40.0 and over, adult: Secondary | ICD-10-CM

## 2014-05-20 NOTE — Progress Notes (Signed)
Patient ID: Sandra Lawrence, female   DOB: 1946/05/14, 69 y.o.   MRN: [id number]     Reason for office visit Preoperative cardiovascular examination  Sandra Lawrence is a 69 y.o. female with a past medical history significant for multiple coronary risk factors (hypertension, diabetes mellitus, hyperlipidemia, remote smoking, current secondhand tobacco smoke exposure), but with only mildly stenosed coronary arteries at the time of angiography in 2009 (30% mid LAD, 50% ostial lesions in 3 oblique marginal branches). She was hospitalized with palpitations in March 2015 and a nuclear stress test at that time showed normal perfusion and an ejection fraction of greater than 60%. Interestingly, her palpitations subside when she takes a sublingual nitroglycerin. Since that hospitalization she has taken nitroglycerin 2 or 3 times in about 10 months. No sustained arrhythmia has been identified.   She has a chronically abnormal electrocardiogram with ST depression and T-wave inversion in the inferior lateral leads, likely secondary to left ventricular hypertrophy (voltage for LVH not present due to obesity, in turn secondary to hypertension. She also has obstructive sleep apnea and is compliant with CPAP and has diet-controlled diabetes mellitus. Functional status is hard to assess due to orthopedic limitations (she is actually in a wheelchair today).  She is planning right knee total replacement with a prosthesis. She underwent left knee replacement in 2012 without cardiac complications.   No Known Allergies  Current Outpatient Prescriptions  Medication Sig Dispense Refill  . acetaminophen (TYLENOL) 500 MG tablet Take 1,000 mg by mouth every 6 (six) hours as needed for mild pain.    . Aspirin-Salicylamide-Caffeine (BC HEADACHE POWDER PO) Take 1 packet by mouth daily as needed (for pain).    . hydrochlorothiazide (MICROZIDE) 12.5 MG capsule TAKE 1 CAPSULE (12.5 MG TOTAL) BY MOUTH DAILY. PATIENT NEEDS A FOLLOW UP VISIT  FOR BLOOD PRESSURE 90 capsule 3  . HYDROcodone-acetaminophen (NORCO/VICODIN) 5-325 MG per tablet Take 1 tablet by mouth every 6 (six) hours as needed for moderate pain. 20 tablet 0  . lisinopril (PRINIVIL,ZESTRIL) 40 MG tablet Take 1 tablet (40 mg total) by mouth daily. 90 tablet 3  . metoprolol tartrate (LOPRESSOR) 25 MG tablet Take 12.5 mg by mouth 2 (two) times daily.    . nitroGLYCERIN (NITROSTAT) 0.4 MG SL tablet Place 0.4 mg under the tongue every 5 (five) minutes as needed for chest pain.     Marland Kitchen orlistat (XENICAL) 120 MG capsule Take 120 mg by mouth 3 (three) times daily with meals.    Marland Kitchen oxyCODONE-acetaminophen (PERCOCET/ROXICET) 5-325 MG per tablet Take 1 tablet by mouth at bedtime as needed for moderate pain or severe pain.    . pravastatin (PRAVACHOL) 40 MG tablet Take 40 mg by mouth daily.    . traMADol (ULTRAM) 50 MG tablet Take 1-2 tablets (50-100 mg total) by mouth every 6 (six) hours as needed for moderate pain. 30 tablet 0  . ALPRAZolam (XANAX) 1 MG tablet   3   No current facility-administered medications for this visit.    Past Medical History  Diagnosis Date  . Hypertensive heart disease without congestive heart failure   . Obesity   . Degenerative joint disease     BOTH KNEES  . PVC (premature ventricular contraction)   . Palpitations   . Asthma   . Hypercholesteremia   . Fatty tumor   . CAD (coronary artery disease) 02/15/2007  . Hyperlipidemia 04/17/2011  . Anxiety   . Allergy   . Sleep apnea     wears CPAP  .  Blood transfusion without reported diagnosis   . Depression   . Diabetes mellitus     no medicines  . Diabetes mellitus type 2, noninsulin dependent   . GERD (gastroesophageal reflux disease)   . Heart murmur   . Hypertension   . Chronic kidney disease     kidney stones  . Osteoporosis   . Bursitis     right hip  . Full dentures   . Wears glasses     Past Surgical History  Procedure Laterality Date  . Hemorroidectomy    . Cesarean section      . Replacement total knee  2012    left  . Cardiac catheterization  2011  . Dilation and curettage of uterus    . Breast lumpectomy with radioactive seed localization Left 10/03/2013    Procedure: BREAST LUMPECTOMY WITH RADIOACTIVE SEED LOCALIZATION;  Surgeon: Joyice Faster. Cornett, MD;  Location: [facility];  Service: General;  Laterality: Left;    Family History  Problem Relation Age of Onset  . Cancer Father   . Diabetes Mother   . Colon cancer Neg Hx   . Esophageal cancer Neg Hx   . Stomach cancer Neg Hx   . Rectal cancer Neg Hx     History   Social History  . Marital Status: Widowed    Spouse Name: N/A    Number of Children: N/A  . Years of Education: N/A   Occupational History  . Not on file.   Social History Main Topics  . Smoking status: Former Smoker    Quit date: 11/10/1979  . Smokeless tobacco: Never Used  . Alcohol Use: Yes     Comment: occasionally  . Drug Use: No  . Sexual Activity: Not Currently   Other Topics Concern  . Not on file   Social History Narrative   Lives alone.  Divorced  twice.    Review of systems: The patient specifically denies any chest pain at rest or with exertion, dyspnea at rest or with exertion, orthopnea, paroxysmal nocturnal dyspnea, syncope, infrequent palpitations, focal neurological deficits, intermittent claudication, lower extremity edema, unexplained weight gain, cough, hemoptysis or wheezing.  The patient also denies abdominal pain, nausea, vomiting, dysphagia, diarrhea, constipation, polyuria, polydipsia, dysuria, hematuria, frequency, urgency, abnormal bleeding or bruising, fever, chills, unexpected weight changes, mood swings, change in skin or hair texture, change in voice quality, auditory or visual problems, allergic reactions or rashes, new musculoskeletal complaints other than usual "aches and pains".   PHYSICAL EXAM BP 147/93 mmHg  Pulse 64  Resp 20  Ht 5' 6.5" (1.689 m)  Wt 254 lb (115.214 kg)   BMI 40.39 kg/m2 The pressure rechecked was 138/82 mmHg General: Alert, oriented x3, no distress Head: no evidence of trauma, PERRL, EOMI, no exophtalmos or lid lag, no myxedema, no xanthelasma; normal ears, nose and oropharynx Neck: normal jugular venous pulsations and no hepatojugular reflux; brisk carotid pulses without delay and no carotid bruits Chest: clear to auscultation, no signs of consolidation by percussion or palpation, normal fremitus, symmetrical and full respiratory excursions Cardiovascular: Unable to identify the apical impulse, regular rhythm, normal first and second heart sounds, no murmurs, rubs or gallops Abdomen: no tenderness or distention, no masses by palpation, no abnormal pulsatility or arterial bruits, normal bowel sounds, no hepatosplenomegaly Extremities: no clubbing, cyanosis or edema; 2+ radial, ulnar and brachial pulses bilaterally; 2+ right femoral, posterior tibial and dorsalis pedis pulses; 2+ left femoral, posterior tibial and dorsalis pedis pulses; no subclavian  or femoral bruits. Right knee in a brace. Neurological: grossly nonfocal   EKG: Sinus rhythm with PVCs. Downsloping ST depression and T-wave inversion in the inferior lateral leads, unchanged from previous tracings.  Lipid Panel     Component Value Date/Time   CHOL 208* 07/22/2013 0125   TRIG 121 07/22/2013 0125   HDL 44 07/22/2013 0125   CHOLHDL 4.7 07/22/2013 0125   VLDL 24 07/22/2013 0125   LDLCALC 140* 07/22/2013 0125    BMET    Component Value Date/Time   NA 142 10/14/2013 1838   K 3.5* 10/14/2013 1838   CL 104 10/14/2013 1838   CO2 25 10/14/2013 1838   GLUCOSE 110* 10/14/2013 1838   BUN 10 10/14/2013 1838   CREATININE 0.68 10/14/2013 1838   CREATININE 0.70 07/04/2012 0955   CALCIUM 9.5 10/14/2013 1838   GFRNONAA 90* 10/14/2013 1838   GFRAA >90 10/14/2013 1838     ASSESSMENT AND PLAN  Mrs. Khanna does not have angina pectoris and had a recent normal nuclear perfusion  study. Her electrocardiogram, while abnormal, is unchanged from previous tracings. She is at low risk for major cardiovascular complications with the planned knee surgery.  I think she is at increased risk for DVT/PE with knee surgery due to her obesity and standard anticoagulant prevention should be employed. I would also be concerned about problems with hypoventilation due to her obesity and obstructive sleep apnea when she is recovering from anesthesia.  Meds ordered this encounter  Medications  . ALPRAZolam Duanne Moron) 1 MG tablet    Sig:     Refill:  [place] Sehaj Mcenroe, MD, [facility] [phone] office [phone] pager

## 2014-05-20 NOTE — Patient Instructions (Signed)
A surgical clearance will be sent to your surgeon.  Follow-up as needed if you have any cardiac problems.

## 2014-05-29 NOTE — H&P (Signed)
TOTAL KNEE ADMISSION H&P  Patient is being admitted for right total knee arthroplasty.  Subjective:  Chief Complaint:     Right knee primary OA / pain.  HPI: Sandra Lawrence, 69 y.o. female, has a history of pain and functional disability in the right knee due to arthritis and has failed non-surgical conservative treatments for greater than 12 weeks to includeNSAID's and/or analgesics, corticosteriod injections, viscosupplementation injections, use of assistive devices and activity modification.  Onset of symptoms was gradual, starting 3+ years ago with gradually worsening course since that time. The patient noted prior procedures on the knee to include  arthroplasty on the left knee per Dr. Alvan Dame in 2012.  Patient currently rates pain in the right knee(s) at 10 out of 10 with activity. Patient has night pain, worsening of pain with activity and weight bearing, pain that interferes with activities of daily living, pain with passive range of motion, crepitus and joint swelling.  Patient has evidence of periarticular osteophytes and joint space narrowing by imaging studies.  There is no active infection.  Risks, benefits and expectations were discussed with the patient.  Risks including but not limited to the risk of anesthesia, blood clots, nerve damage, blood vessel damage, failure of the prosthesis, infection and up to and including death.  Patient understand the risks, benefits and expectations and wishes to proceed with surgery.   PCP: Wyatt Haste, MD  D/C Plans:      SNF [facility])  Post-op Meds:       No Rx given  Tranexamic Acid:      To be given - IV   Decadron:      Is to be given  FYI:     ASA post-op  Norco post-op  CPAP    Patient Active Problem List   Diagnosis Date Noted  . Preop cardiovascular exam 05/20/2014  . Sleep apnea 09/05/2012  . History of colonic polyps 09/05/2012  . Hypertensive heart disease without CHF   . Diabetes mellitus type 2, noninsulin dependent    . Hyperlipidemia   . Arthritis 03/17/2011  . Morbid obesity with BMI of 40.0-44.9, adult 03/17/2011  . CAD (coronary artery disease) 02/15/2007   Past Medical History  Diagnosis Date  . Hypertensive heart disease without congestive heart failure   . Obesity   . Degenerative joint disease     BOTH KNEES  . PVC (premature ventricular contraction)   . Palpitations   . Asthma   . Hypercholesteremia   . Fatty tumor   . CAD (coronary artery disease) 02/15/2007  . Hyperlipidemia 04/17/2011  . Anxiety   . Allergy   . Sleep apnea     wears CPAP  . Blood transfusion without reported diagnosis   . Depression   . Diabetes mellitus     no medicines  . Diabetes mellitus type 2, noninsulin dependent   . GERD (gastroesophageal reflux disease)   . Heart murmur   . Hypertension   . Chronic kidney disease     kidney stones  . Osteoporosis   . Bursitis     right hip  . Full dentures   . Wears glasses     Past Surgical History  Procedure Laterality Date  . Hemorroidectomy    . Cesarean section    . Replacement total knee  2012    left  . Cardiac catheterization  2011  . Dilation and curettage of uterus    . Breast lumpectomy with radioactive seed localization Left 10/03/2013  Procedure: BREAST LUMPECTOMY WITH RADIOACTIVE SEED LOCALIZATION;  Surgeon: Joyice Faster. Cornett, MD;  Location: [facility];  Service: General;  Laterality: Left;    No prescriptions prior to admission   No Known Allergies   History  Substance Use Topics  . Smoking status: Former Smoker    Quit date: 11/10/1979  . Smokeless tobacco: Never Used  . Alcohol Use: Yes     Comment: occasionally    Family History  Problem Relation Age of Onset  . Cancer Father   . Diabetes Mother   . Colon cancer Neg Hx   . Esophageal cancer Neg Hx   . Stomach cancer Neg Hx   . Rectal cancer Neg Hx      Review of Systems  Constitutional: Negative.   HENT: Negative.   Eyes: Negative.   Respiratory:  Negative.   Cardiovascular: Negative.   Gastrointestinal: Positive for heartburn.  Genitourinary: Negative.   Musculoskeletal: Positive for joint pain.  Skin: Negative.   Neurological: Negative.   Endo/Heme/Allergies: Positive for environmental allergies.  Psychiatric/Behavioral: Positive for depression. The patient is nervous/anxious.     Objective:  Physical Exam  Constitutional: She is oriented to person, place, and time. She appears well-developed and well-nourished.  HENT:  Head: Normocephalic and atraumatic.  Mouth/Throat: Oropharynx is clear and moist.  Eyes: Pupils are equal, round, and reactive to light.  Neck: Neck supple. No JVD present. No tracheal deviation present. No thyromegaly present.  Cardiovascular: Normal rate, regular rhythm, normal heart sounds and intact distal pulses.   Respiratory: Effort normal and breath sounds normal. No stridor. No respiratory distress. She has no wheezes.  GI: Soft. There is no tenderness. There is no guarding.  Musculoskeletal:       Right knee: She exhibits decreased range of motion, swelling and bony tenderness. She exhibits no ecchymosis, no deformity, no laceration and no erythema. Tenderness found.  Lymphadenopathy:    She has no cervical adenopathy.  Neurological: She is alert and oriented to person, place, and time.  Skin: Skin is warm and dry.  Psychiatric: She has a normal mood and affect.      Labs:  Estimated body mass index is 40.53 kg/(m^2) as calculated from the following:   Height as of 10/03/13: 5\' 6"  (1.676 m).   Weight as of 10/31/13: 113.853 kg (251 lb).   Imaging Review Plain radiographs demonstrate severe degenerative joint disease of the right knee(s). The overall alignment is  neutral. The bone quality appears to be good for age and reported activity level.  Assessment/Plan:  End stage arthritis, right knee   The patient history, physical examination, clinical judgment of the provider and imaging  studies are consistent with end stage degenerative joint disease of the right knee(s) and total knee arthroplasty is deemed medically necessary. The treatment options including medical management, injection therapy arthroscopy and arthroplasty were discussed at length. The risks and benefits of total knee arthroplasty were presented and reviewed. The risks due to aseptic loosening, infection, stiffness, patella tracking problems, thromboembolic complications and other imponderables were discussed. The patient acknowledged the explanation, agreed to proceed with the plan and consent was signed. Patient is being admitted for inpatient treatment for surgery, pain control, PT, OT, prophylactic antibiotics, VTE prophylaxis, progressive ambulation and ADL's and discharge planning. The patient is planning to be discharged to skilled nursing facility.    [place] Dezeray Puccio   PA-C  05/29/2014, 1:08 PM

## 2014-05-30 ENCOUNTER — Encounter: Payer: Self-pay | Admitting: Family Medicine

## 2014-05-30 ENCOUNTER — Ambulatory Visit (INDEPENDENT_AMBULATORY_CARE_PROVIDER_SITE_OTHER): Payer: Medicare Other | Admitting: Family Medicine

## 2014-05-30 VITALS — BP 130/80 | HR 79 | Wt 253.0 lb

## 2014-05-30 DIAGNOSIS — I119 Hypertensive heart disease without heart failure: Secondary | ICD-10-CM | POA: Diagnosis not present

## 2014-05-30 DIAGNOSIS — J4520 Mild intermittent asthma, uncomplicated: Secondary | ICD-10-CM

## 2014-05-30 MED ORDER — ALBUTEROL SULFATE HFA 108 (90 BASE) MCG/ACT IN AERS: 2.0000 | INHALATION_SPRAY | Freq: Four times a day (QID) | RESPIRATORY_TRACT | Status: DC | PRN

## 2014-05-30 NOTE — Patient Instructions (Signed)
If you use the inhaler more than twice a week during the day or twice a month at night, let me know

## 2014-05-30 NOTE — Progress Notes (Signed)
   Subjective:    Patient ID: Sandra Lawrence, female    DOB: [birth year], 69 y.o.   MRN: [id number]  HPI She is here for recheck on her blood pressure. She also is had difficulty recently with her asthma. She has a previous history of asthma and was on a steroid inhaler however she stopped it several years ago on her own.   Review of Systems     Objective:   Physical Exam Urgent and in no distress. Blood pressure is recorded.       Assessment & Plan:  Hypertensive heart disease without CHF  Asthma, mild intermittent, uncomplicated - Plan: albuterol (PROVENTIL HFA;VENTOLIN HFA) 108 (90 BASE) MCG/ACT inhaler  I will place her back on her Ventolin. She will keep track of her use of the medication and return here in 2 weeks. She will be cleared for surgery.

## 2014-05-31 ENCOUNTER — Encounter: Payer: Self-pay | Admitting: Family Medicine

## 2014-05-31 ENCOUNTER — Telehealth: Payer: Self-pay | Admitting: Family Medicine

## 2014-05-31 NOTE — Telephone Encounter (Signed)
lm

## 2014-06-04 ENCOUNTER — Encounter (HOSPITAL_COMMUNITY)
Admission: RE | Admit: 2014-06-04 | Discharge: 2014-06-04 | Disposition: A | Discharge status: Home / Self Care | Payer: Medicare Other | Source: Ambulatory Visit | Attending: Orthopedic Surgery | Admitting: Orthopedic Surgery

## 2014-06-04 ENCOUNTER — Encounter (HOSPITAL_COMMUNITY): Payer: Self-pay

## 2014-06-04 DIAGNOSIS — M179 Osteoarthritis of knee, unspecified: Secondary | ICD-10-CM | POA: Insufficient documentation

## 2014-06-04 DIAGNOSIS — Z01818 Encounter for other preprocedural examination: Secondary | ICD-10-CM | POA: Insufficient documentation

## 2014-06-04 LAB — CBC
HCT: 35.7 % — ABNORMAL LOW (ref 36.0–46.0)
Hemoglobin: 11.3 g/dL — ABNORMAL LOW (ref 12.0–15.0)
MCH: 28.3 pg (ref 26.0–34.0)
MCHC: 31.7 g/dL (ref 30.0–36.0)
MCV: 89.5 fL (ref 78.0–100.0)
Platelets: 272 10*3/uL (ref 150–400)
RBC: 3.99 MIL/uL (ref 3.87–5.11)
RDW: 14.5 % (ref 11.5–15.5)
WBC: 4.7 10*3/uL (ref 4.0–10.5)

## 2014-06-04 LAB — BASIC METABOLIC PANEL
ANION GAP: 5 (ref 5–15)
BUN: 22 mg/dL (ref 6–23)
CALCIUM: 9.2 mg/dL (ref 8.4–10.5)
CHLORIDE: 107 meq/L (ref 96–112)
CO2: 28 mmol/L (ref 19–32)
Creatinine, Ser: 0.63 mg/dL (ref 0.50–1.10)
GFR calc non Af Amer: >90 mL/min (ref >90)
Glucose, Bld: 97 mg/dL (ref 70–99)
Potassium: 4.2 mmol/L (ref 3.5–5.1)
SODIUM: 140 mmol/L (ref 135–145)

## 2014-06-04 LAB — PROTIME-INR
INR: 1.07 (ref 0.00–1.49)
PROTHROMBIN TIME: 14 s (ref 11.6–15.2)

## 2014-06-04 LAB — TYPE AND SCREEN
ABO/RH(D): A POS
Antibody Screen: NEGATIVE

## 2014-06-04 LAB — URINALYSIS, ROUTINE W REFLEX MICROSCOPIC
Bilirubin Urine: NEGATIVE
GLUCOSE, UA: NEGATIVE mg/dL
Hgb urine dipstick: NEGATIVE
KETONES UR: NEGATIVE mg/dL
Leukocytes, UA: NEGATIVE
NITRITE: NEGATIVE
PH: 6.5 (ref 5.0–8.0)
Protein, ur: NEGATIVE mg/dL
Specific Gravity, Urine: 1.022 (ref 1.005–1.030)
Urobilinogen, UA: 1 mg/dL (ref 0.0–1.0)

## 2014-06-04 LAB — APTT: APTT: 40 s — AB (ref 24–37)

## 2014-06-04 LAB — SURGICAL PCR SCREEN
MRSA, PCR: NEGATIVE
Staphylococcus aureus: NEGATIVE

## 2014-06-04 NOTE — Pre-Procedure Instructions (Signed)
06-04-14 EKG 12'15, Stress 3'15, CT Chest 5'15 - Epic. Clearance note (Dr. Redmond School 05-31-14, Dr.Croitoru,cardiology 05-20-14)-with chart.

## 2014-06-04 NOTE — Patient Instructions (Addendum)
Sandra Lawrence  06/04/2014   Your procedure is scheduled on:   06-10-2014 Monday  Enter through [facility]  Entrance and follow signs to [facility]. Arrive at  1030      AM..  Call this number if you have problems the morning of surgery: [phone]  Or Presurgical Testing [phone].   For Living Will and/or Health Care Power Attorney Forms: please provide copy for your medical record,may bring AM of surgery(Forms should be already notarized -we do not provide this service).(06-04-14 Yes/ No information preferred today).   For Cpap use: Bring mask and tubing only.   Do not eat food/ or drink: After Midnight.  Exception: may have clear liquids:up to 6 Hours before arrival. Nothing after:0700 AM.  Clear liquids include soda, tea, black coffee, apple or grape juice, broth.  Take these medicines the morning of surgery with A SIP OF WATER: Metoprolol. Pravastatin. Tramadol or Tylenol. Use/bring Inhaler.   Do not wear jewelry, make-up or nail polish.  Do not wear deodorant, lotions, powders, or perfumes.   Do not shave legs and under arms- 48 hours(2 days) prior to first CHG shower.(Shaving face and neck okay.)  Do not bring valuables to the hospital.(Hospital is not responsible for lost valuables).  Contacts, dentures or removable bridgework, body piercing, hair pins may not be worn into surgery.  Leave suitcase in the car. After surgery it may be brought to your room.  For patients admitted to the hospital, checkout time is 11:00 AM the day of discharge.(Restricted visitors-Any Persons displaying flu-like symptoms or illness).    Patients discharged the day of surgery will not be allowed to drive home. Must have responsible person with you x 24 hours once discharged.  Name and phone number of your driver: Sandra Lawrence,Sandra [phone] cell     Please read over the following fact sheets that you were given:  CHG(Chlorhexidine Gluconate 4% Surgical Soap) use, MRSA Information,  Blood Transfusion fact sheet, Incentive Spirometry Instruction.  Remember : Type/Screen "Blue armbands" - may not be removed once applied(would result in being retested AM of surgery, if removed).         [place] - Preparing for Surgery Before surgery, you can play an important role.  Because skin is not sterile, your skin needs to be as free of germs as possible.  You can reduce the number of germs on your skin by washing with CHG (chlorahexidine gluconate) soap before surgery.  CHG is an antiseptic cleaner which kills germs and bonds with the skin to continue killing germs even after washing. Please DO NOT use if you have an allergy to CHG or antibacterial soaps.  If your skin becomes reddened/irritated stop using the CHG and inform your nurse when you arrive at Short Stay. Do not shave (including legs and underarms) for at least 48 hours prior to the first CHG shower.  You may shave your face/neck. Please follow these instructions carefully:  1.  Shower with CHG Soap the night before surgery and the  morning of Surgery.  2.  If you choose to wash your hair, wash your hair first as usual with your  normal  shampoo.  3.  After you shampoo, rinse your hair and body thoroughly to remove the  shampoo.                           4.  Use CHG as you would any other liquid soap.  You can apply chg directly  to the skin and wash                       Gently with a scrungie or clean washcloth.  5.  Apply the CHG Soap to your body ONLY FROM THE NECK DOWN.   Do not use on face/ open                           Wound or open sores. Avoid contact with eyes, ears mouth and genitals (private parts).                       Wash face,  Genitals (private parts) with your normal soap.             6.  Wash thoroughly, paying special attention to the area where your surgery  will be performed.  7.  Thoroughly rinse your body with warm water from the neck down.  8.  DO NOT shower/wash with your normal soap after  using and rinsing off  the CHG Soap.                9.  Pat yourself dry with a clean towel.            10.  Wear clean pajamas.            11.  Place clean sheets on your bed the night of your first shower and do not  sleep with pets. Day of Surgery : Do not apply any lotions/deodorants the morning of surgery.  Please wear clean clothes to the hospital/surgery center.  FAILURE TO FOLLOW THESE INSTRUCTIONS MAY RESULT IN THE CANCELLATION OF YOUR SURGERY PATIENT SIGNATURE_________________________________  NURSE SIGNATURE__________________________________  ________________________________________________________________________   Adam Phenix  An incentive spirometer is a tool that can help keep your lungs clear and active. This tool measures how well you are filling your lungs with each breath. Taking long deep breaths may help reverse or decrease the chance of developing breathing (pulmonary) problems (especially infection) following:  A long period of time when you are unable to move or be active. BEFORE THE PROCEDURE   If the spirometer includes an indicator to show your best effort, your nurse or respiratory therapist will set it to a desired goal.  If possible, sit up straight or lean slightly forward. Try not to slouch.  Hold the incentive spirometer in an upright position. INSTRUCTIONS FOR USE   Sit on the edge of your bed if possible, or sit up as far as you can in bed or on a chair.  Hold the incentive spirometer in an upright position.  Breathe out normally.  Place the mouthpiece in your mouth and seal your lips tightly around it.  Breathe in slowly and as deeply as possible, raising the piston or the ball toward the top of the column.  Hold your breath for 3-5 seconds or for as long as possible. Allow the piston or ball to fall to the bottom of the column.  Remove the mouthpiece from your mouth and breathe out normally.  Rest for a few seconds and repeat  Steps 1 through 7 at least 10 times every 1-2 hours when you are awake. Take your time and take a few normal breaths between deep breaths.  The spirometer may include an indicator to show your best effort. Use the indicator as a goal to work  toward during each repetition.  After each set of 10 deep breaths, practice coughing to be sure your lungs are clear. If you have an incision (the cut made at the time of surgery), support your incision when coughing by placing a pillow or rolled up towels firmly against it. Once you are able to get out of bed, walk around indoors and cough well. You may stop using the incentive spirometer when instructed by your caregiver.  RISKS AND COMPLICATIONS  Take your time so you do not get dizzy or light-headed.  If you are in pain, you may need to take or ask for pain medication before doing incentive spirometry. It is harder to take a deep breath if you are having pain. AFTER USE  Rest and breathe slowly and easily.  It can be helpful to keep track of a log of your progress. Your caregiver can provide you with a simple table to help with this. If you are using the spirometer at home, follow these instructions: [place] IF:   You are having difficultly using the spirometer.  You have trouble using the spirometer as often as instructed.  Your pain medication is not giving enough relief while using the spirometer.  You develop fever of 100.5 F (38.1 C) or higher. SEEK IMMEDIATE MEDICAL CARE IF:   You cough up bloody sputum that had not been present before.  You develop fever of 102 F (38.9 C) or greater.  You develop worsening pain at or near the incision site. MAKE SURE YOU:   Understand these instructions.  Will watch your condition.  Will get help right away if you are not doing well or get worse. Document Released: 09/13/2006 Document Revised: 07/26/2011 Document Reviewed: 11/14/2006 Exit[employer] Patient Information 2014 Exit[employer],  [place].   ________________________________________________________________________  WHAT IS A BLOOD TRANSFUSION? Blood Transfusion Information  A transfusion is the replacement of blood or some of its parts. Blood is made up of multiple cells which provide different functions.  Red blood cells carry oxygen and are used for blood loss replacement.  White blood cells fight against infection.  Platelets control bleeding.  Plasma helps clot blood.  Other blood products are available for specialized needs, such as hemophilia or other clotting disorders. BEFORE THE TRANSFUSION  Who gives blood for transfusions?   Healthy volunteers who are fully evaluated to make sure their blood is safe. This is blood bank blood. Transfusion therapy is the safest it has ever been in the practice of medicine. Before blood is taken from a donor, a complete history is taken to make sure that person has no history of diseases nor engages in risky social behavior (examples are intravenous drug use or sexual activity with multiple partners). The donor's travel history is screened to minimize risk of transmitting infections, such as malaria. The donated blood is tested for signs of infectious diseases, such as HIV and hepatitis. The blood is then tested to be sure it is compatible with you in order to minimize the chance of a transfusion reaction. If you or a relative donates blood, this is often done in anticipation of surgery and is not appropriate for emergency situations. It takes many days to process the donated blood. RISKS AND COMPLICATIONS Although transfusion therapy is very safe and saves many lives, the main dangers of transfusion include:   Getting an infectious disease.  Developing a transfusion reaction. This is an allergic reaction to something in the blood you were given. Every precaution is taken to  prevent this. The decision to have a blood transfusion has been considered carefully by your caregiver  before blood is given. Blood is not given unless the benefits outweigh the risks. AFTER THE TRANSFUSION  Right after receiving a blood transfusion, you will usually feel much better and more energetic. This is especially true if your red blood cells have gotten low (anemic). The transfusion raises the level of the red blood cells which carry oxygen, and this usually causes an energy increase.  The nurse administering the transfusion will monitor you carefully for complications. HOME CARE INSTRUCTIONS  No special instructions are needed after a transfusion. You may find your energy is better. Speak with your caregiver about any limitations on activity for underlying diseases you may have. SEEK MEDICAL CARE IF:   Your condition is not improving after your transfusion.  You develop redness or irritation at the intravenous (IV) site. SEEK IMMEDIATE MEDICAL CARE IF:  Any of the following symptoms occur over the next 12 hours:  Shaking chills.  You have a temperature by mouth above 102 F (38.9 C), not controlled by medicine.  Chest, back, or muscle pain.  People around you feel you are not acting correctly or are confused.  Shortness of breath or difficulty breathing.  Dizziness and fainting.  You get a rash or develop hives.  You have a decrease in urine output.  Your urine turns a dark color or changes to pink, red, or brown. Any of the following symptoms occur over the next 10 days:  You have a temperature by mouth above 102 F (38.9 C), not controlled by medicine.  Shortness of breath.  Weakness after normal activity.  The white part of the eye turns yellow (jaundice).  You have a decrease in the amount of urine or are urinating less often.  Your urine turns a dark color or changes to pink, red, or brown. Document Released: 04/30/2000 Document Revised: 07/26/2011 Document Reviewed: 12/18/2007 [facility] Patient Information 2014 [place],  [place].  _______________________________________________________________________

## 2014-06-10 ENCOUNTER — Encounter (HOSPITAL_COMMUNITY)
Admission: RE | Disposition: A | Discharge status: Skilled Nursing Facility | Payer: Self-pay | Source: Ambulatory Visit | Attending: Orthopedic Surgery

## 2014-06-10 ENCOUNTER — Inpatient Hospital Stay (HOSPITAL_COMMUNITY): Payer: Medicare Other | Admitting: Anesthesiology

## 2014-06-10 ENCOUNTER — Inpatient Hospital Stay (HOSPITAL_COMMUNITY)
Admission: RE | Admit: 2014-06-10 | Discharge: 2014-06-12 | DRG: 470 | Disposition: A | Discharge status: Skilled Nursing Facility | Payer: Medicare Other | Source: Ambulatory Visit | Attending: Orthopedic Surgery | Admitting: Orthopedic Surgery

## 2014-06-10 ENCOUNTER — Encounter (HOSPITAL_COMMUNITY): Payer: Self-pay | Admitting: *Deleted

## 2014-06-10 DIAGNOSIS — M810 Age-related osteoporosis without current pathological fracture: Secondary | ICD-10-CM | POA: Diagnosis present

## 2014-06-10 DIAGNOSIS — F329 Major depressive disorder, single episode, unspecified: Secondary | ICD-10-CM | POA: Diagnosis present

## 2014-06-10 DIAGNOSIS — R2681 Unsteadiness on feet: Secondary | ICD-10-CM | POA: Diagnosis not present

## 2014-06-10 DIAGNOSIS — I2510 Atherosclerotic heart disease of native coronary artery without angina pectoris: Secondary | ICD-10-CM | POA: Diagnosis not present

## 2014-06-10 DIAGNOSIS — Z96651 Presence of right artificial knee joint: Secondary | ICD-10-CM | POA: Diagnosis not present

## 2014-06-10 DIAGNOSIS — G4730 Sleep apnea, unspecified: Secondary | ICD-10-CM | POA: Diagnosis not present

## 2014-06-10 DIAGNOSIS — E780 Pure hypercholesterolemia: Secondary | ICD-10-CM | POA: Diagnosis not present

## 2014-06-10 DIAGNOSIS — E669 Obesity, unspecified: Secondary | ICD-10-CM | POA: Diagnosis not present

## 2014-06-10 DIAGNOSIS — E119 Type 2 diabetes mellitus without complications: Secondary | ICD-10-CM | POA: Diagnosis present

## 2014-06-10 DIAGNOSIS — Z6841 Body Mass Index (BMI) 40.0 and over, adult: Secondary | ICD-10-CM

## 2014-06-10 DIAGNOSIS — K219 Gastro-esophageal reflux disease without esophagitis: Secondary | ICD-10-CM | POA: Diagnosis present

## 2014-06-10 DIAGNOSIS — Z87442 Personal history of urinary calculi: Secondary | ICD-10-CM

## 2014-06-10 DIAGNOSIS — R011 Cardiac murmur, unspecified: Secondary | ICD-10-CM | POA: Diagnosis not present

## 2014-06-10 DIAGNOSIS — Z86010 Personal history of colonic polyps: Secondary | ICD-10-CM | POA: Diagnosis not present

## 2014-06-10 DIAGNOSIS — I119 Hypertensive heart disease without heart failure: Secondary | ICD-10-CM | POA: Diagnosis not present

## 2014-06-10 DIAGNOSIS — R278 Other lack of coordination: Secondary | ICD-10-CM | POA: Diagnosis not present

## 2014-06-10 DIAGNOSIS — Z96652 Presence of left artificial knee joint: Secondary | ICD-10-CM | POA: Diagnosis present

## 2014-06-10 DIAGNOSIS — S83104A Unspecified dislocation of right knee, initial encounter: Secondary | ICD-10-CM | POA: Diagnosis not present

## 2014-06-10 DIAGNOSIS — J45909 Unspecified asthma, uncomplicated: Secondary | ICD-10-CM | POA: Diagnosis not present

## 2014-06-10 DIAGNOSIS — F419 Anxiety disorder, unspecified: Secondary | ICD-10-CM | POA: Diagnosis present

## 2014-06-10 DIAGNOSIS — I493 Ventricular premature depolarization: Secondary | ICD-10-CM | POA: Diagnosis not present

## 2014-06-10 DIAGNOSIS — R110 Nausea: Secondary | ICD-10-CM | POA: Diagnosis not present

## 2014-06-10 DIAGNOSIS — R9431 Abnormal electrocardiogram [ECG] [EKG]: Secondary | ICD-10-CM | POA: Diagnosis not present

## 2014-06-10 DIAGNOSIS — Z833 Family history of diabetes mellitus: Secondary | ICD-10-CM

## 2014-06-10 DIAGNOSIS — M1711 Unilateral primary osteoarthritis, right knee: Secondary | ICD-10-CM | POA: Diagnosis not present

## 2014-06-10 DIAGNOSIS — E785 Hyperlipidemia, unspecified: Secondary | ICD-10-CM | POA: Diagnosis not present

## 2014-06-10 DIAGNOSIS — R531 Weakness: Secondary | ICD-10-CM | POA: Diagnosis not present

## 2014-06-10 DIAGNOSIS — M179 Osteoarthritis of knee, unspecified: Secondary | ICD-10-CM | POA: Diagnosis not present

## 2014-06-10 DIAGNOSIS — Z96659 Presence of unspecified artificial knee joint: Secondary | ICD-10-CM

## 2014-06-10 DIAGNOSIS — I10 Essential (primary) hypertension: Secondary | ICD-10-CM | POA: Diagnosis not present

## 2014-06-10 DIAGNOSIS — M25561 Pain in right knee: Secondary | ICD-10-CM | POA: Diagnosis not present

## 2014-06-10 DIAGNOSIS — Z471 Aftercare following joint replacement surgery: Secondary | ICD-10-CM | POA: Diagnosis not present

## 2014-06-10 DIAGNOSIS — M25661 Stiffness of right knee, not elsewhere classified: Secondary | ICD-10-CM | POA: Diagnosis not present

## 2014-06-10 DIAGNOSIS — Z87891 Personal history of nicotine dependence: Secondary | ICD-10-CM | POA: Diagnosis not present

## 2014-06-10 DIAGNOSIS — M6281 Muscle weakness (generalized): Secondary | ICD-10-CM | POA: Diagnosis not present

## 2014-06-10 DIAGNOSIS — F328 Other depressive episodes: Secondary | ICD-10-CM | POA: Diagnosis not present

## 2014-06-10 HISTORY — PX: TOTAL KNEE ARTHROPLASTY: SHX125

## 2014-06-10 HISTORY — DX: Presence of unspecified artificial knee joint: Z96.659

## 2014-06-10 HISTORY — DX: Constipation, unspecified: K59.00

## 2014-06-10 LAB — TROPONIN I: Troponin I: <0.03 ng/mL (ref <0.031)

## 2014-06-10 LAB — GLUCOSE, CAPILLARY: Glucose-Capillary: 182 mg/dL — ABNORMAL HIGH (ref 70–99)

## 2014-06-10 LAB — CK TOTAL AND CKMB (NOT AT ARMC)
CK TOTAL: 106 U/L (ref 7–177)
CK, MB: 2.1 ng/mL (ref 0.3–4.0)
Relative Index: 2 (ref 0.0–2.5)

## 2014-06-10 SURGERY — ARTHROPLASTY, KNEE, TOTAL
Anesthesia: General | Site: Knee | Laterality: Right

## 2014-06-10 MED ORDER — GLYCOPYRROLATE 0.2 MG/ML IJ SOLN
INTRAMUSCULAR | Status: DC | PRN
  Administered 2014-06-10: 0.6 mg via INTRAVENOUS

## 2014-06-10 MED ORDER — MIDAZOLAM HCL 2 MG/2ML IJ SOLN
INTRAMUSCULAR | Status: AC
  Filled 2014-06-10: qty 2

## 2014-06-10 MED ORDER — PHENOL 1.4 % MT LIQD
1.0000 | OROMUCOSAL | Status: DC | PRN
  Filled 2014-06-10: qty 177

## 2014-06-10 MED ORDER — FERROUS SULFATE 325 (65 FE) MG PO TABS
325.0000 mg | ORAL_TABLET | Freq: Three times a day (TID) | ORAL | Status: DC
  Administered 2014-06-12: 325 mg via ORAL
  Filled 2014-06-10: qty 1

## 2014-06-10 MED ORDER — ONDANSETRON HCL 4 MG/2ML IJ SOLN
INTRAMUSCULAR | Status: AC
  Filled 2014-06-10: qty 2

## 2014-06-10 MED ORDER — SODIUM CHLORIDE 0.9 % IJ SOLN
INTRAMUSCULAR | Status: AC
  Filled 2014-06-10: qty 10

## 2014-06-10 MED ORDER — OXYCODONE HCL 5 MG PO TABS
5.0000 mg | ORAL_TABLET | ORAL | Status: DC
  Administered 2014-06-11 (×2): 10 mg via ORAL
  Administered 2014-06-12 (×2): 15 mg via ORAL
  Administered 2014-06-12: 10 mg via ORAL
  Filled 2014-06-10 (×3): qty 2
  Filled 2014-06-10 (×2): qty 3

## 2014-06-10 MED ORDER — METHOCARBAMOL 1000 MG/10ML IJ SOLN
500.0000 mg | Freq: Four times a day (QID) | INTRAVENOUS | Status: DC | PRN
  Filled 2014-06-10: qty 5

## 2014-06-10 MED ORDER — ONDANSETRON HCL 4 MG PO TABS: 4.0000 mg | ORAL_TABLET | Freq: Four times a day (QID) | ORAL | Status: DC | PRN

## 2014-06-10 MED ORDER — MAGNESIUM CITRATE PO SOLN: 1.0000 | Freq: Once | ORAL | Status: AC | PRN

## 2014-06-10 MED ORDER — CEFAZOLIN SODIUM-DEXTROSE 2-3 GM-% IV SOLR
INTRAVENOUS | Status: AC
  Filled 2014-06-10: qty 50

## 2014-06-10 MED ORDER — FENTANYL CITRATE 0.05 MG/ML IJ SOLN
INTRAMUSCULAR | Status: AC
  Filled 2014-06-10: qty 2

## 2014-06-10 MED ORDER — SODIUM CHLORIDE 0.9 % IV SOLN: INTRAVENOUS | Status: DC

## 2014-06-10 MED ORDER — BISACODYL 10 MG RE SUPP: 10.0000 mg | Freq: Every day | RECTAL | Status: DC | PRN

## 2014-06-10 MED ORDER — METOPROLOL TARTRATE 1 MG/ML IV SOLN
INTRAVENOUS | Status: AC
  Filled 2014-06-10: qty 5

## 2014-06-10 MED ORDER — HYDROCHLOROTHIAZIDE 12.5 MG PO CAPS
12.5000 mg | ORAL_CAPSULE | Freq: Every day | ORAL | Status: DC
Start: 2014-06-11 — End: 2014-06-12
  Administered 2014-06-12: 12.5 mg via ORAL
  Filled 2014-06-10: qty 1

## 2014-06-10 MED ORDER — FENTANYL CITRATE 0.05 MG/ML IJ SOLN
INTRAMUSCULAR | Status: DC | PRN
  Administered 2014-06-10: 100 ug via INTRAVENOUS
  Administered 2014-06-10 (×4): 50 ug via INTRAVENOUS

## 2014-06-10 MED ORDER — MENTHOL 3 MG MT LOZG: 1.0000 | LOZENGE | OROMUCOSAL | Status: DC | PRN

## 2014-06-10 MED ORDER — METOPROLOL TARTRATE 1 MG/ML IV SOLN
INTRAVENOUS | Status: DC | PRN
  Administered 2014-06-10 (×2): 1 mg via INTRAVENOUS
  Administered 2014-06-10: 3 mg via INTRAVENOUS

## 2014-06-10 MED ORDER — ONDANSETRON HCL 4 MG/2ML IJ SOLN
4.0000 mg | Freq: Four times a day (QID) | INTRAMUSCULAR | Status: DC | PRN
  Administered 2014-06-10 – 2014-06-11 (×2): 4 mg via INTRAVENOUS
  Filled 2014-06-10 (×2): qty 2

## 2014-06-10 MED ORDER — LIDOCAINE HCL (CARDIAC) 20 MG/ML IV SOLN
INTRAVENOUS | Status: AC
  Filled 2014-06-10: qty 5

## 2014-06-10 MED ORDER — LABETALOL HCL 5 MG/ML IV SOLN
INTRAVENOUS | Status: AC
  Filled 2014-06-10: qty 4

## 2014-06-10 MED ORDER — DEXAMETHASONE SODIUM PHOSPHATE 10 MG/ML IJ SOLN
10.0000 mg | Freq: Once | INTRAMUSCULAR | Status: AC
  Administered 2014-06-11: 10 mg via INTRAVENOUS
  Filled 2014-06-10: qty 1

## 2014-06-10 MED ORDER — METOPROLOL TARTRATE 25 MG PO TABS
25.0000 mg | ORAL_TABLET | Freq: Two times a day (BID) | ORAL | Status: DC
  Administered 2014-06-10 – 2014-06-12 (×4): 25 mg via ORAL
  Filled 2014-06-10 (×5): qty 1

## 2014-06-10 MED ORDER — SODIUM CHLORIDE 0.9 % IJ SOLN
INTRAMUSCULAR | Status: AC
  Filled 2014-06-10: qty 50

## 2014-06-10 MED ORDER — HYDROMORPHONE HCL 1 MG/ML IJ SOLN
0.5000 mg | INTRAMUSCULAR | Status: DC | PRN
  Administered 2014-06-10: 1 mg via INTRAVENOUS
  Administered 2014-06-10: 2 mg via INTRAVENOUS
  Administered 2014-06-11 (×2): 1 mg via INTRAVENOUS
  Administered 2014-06-11: 2 mg via INTRAVENOUS
  Administered 2014-06-11 – 2014-06-12 (×2): 1 mg via INTRAVENOUS
  Filled 2014-06-10: qty 1
  Filled 2014-06-10: qty 2
  Filled 2014-06-10 (×3): qty 1
  Filled 2014-06-10: qty 2
  Filled 2014-06-10: qty 1

## 2014-06-10 MED ORDER — KETOROLAC TROMETHAMINE 30 MG/ML IJ SOLN
INTRAMUSCULAR | Status: DC | PRN
  Administered 2014-06-10: 30 mg

## 2014-06-10 MED ORDER — ALUM & MAG HYDROXIDE-SIMETH 200-200-20 MG/5ML PO SUSP
30.0000 mL | ORAL | Status: DC | PRN
  Administered 2014-06-11: 30 mL via ORAL
  Filled 2014-06-10: qty 30

## 2014-06-10 MED ORDER — KETOROLAC TROMETHAMINE 30 MG/ML IJ SOLN
INTRAMUSCULAR | Status: AC
  Filled 2014-06-10: qty 1

## 2014-06-10 MED ORDER — DOCUSATE SODIUM 100 MG PO CAPS
100.0000 mg | ORAL_CAPSULE | Freq: Two times a day (BID) | ORAL | Status: DC
  Administered 2014-06-11 – 2014-06-12 (×2): 100 mg via ORAL
  Filled 2014-06-10 (×2): qty 1

## 2014-06-10 MED ORDER — ONDANSETRON HCL 4 MG/2ML IJ SOLN
INTRAMUSCULAR | Status: DC | PRN
  Administered 2014-06-10: 4 mg via INTRAVENOUS

## 2014-06-10 MED ORDER — PRAVASTATIN SODIUM 20 MG PO TABS
40.0000 mg | ORAL_TABLET | Freq: Every day | ORAL | Status: DC
Start: 2014-06-11 — End: 2014-06-12
  Administered 2014-06-12: 40 mg via ORAL
  Filled 2014-06-10: qty 2

## 2014-06-10 MED ORDER — 0.9 % SODIUM CHLORIDE (POUR BTL) OPTIME
TOPICAL | Status: DC | PRN
  Administered 2014-06-10: 1000 mL

## 2014-06-10 MED ORDER — FENTANYL CITRATE 0.05 MG/ML IJ SOLN: 25.0000 ug | INTRAMUSCULAR | Status: DC | PRN

## 2014-06-10 MED ORDER — SIMETHICONE 80 MG PO CHEW
80.0000 mg | CHEWABLE_TABLET | Freq: Four times a day (QID) | ORAL | Status: DC | PRN
  Filled 2014-06-10: qty 1

## 2014-06-10 MED ORDER — ALBUTEROL SULFATE (2.5 MG/3ML) 0.083% IN NEBU: 3.0000 mL | INHALATION_SOLUTION | Freq: Four times a day (QID) | RESPIRATORY_TRACT | Status: DC | PRN

## 2014-06-10 MED ORDER — METOPROLOL TARTRATE 12.5 MG HALF TABLET
12.5000 mg | ORAL_TABLET | Freq: Once | ORAL | Status: AC
  Administered 2014-06-10: 12.5 mg via ORAL
  Filled 2014-06-10: qty 1

## 2014-06-10 MED ORDER — ORLISTAT 120 MG PO CAPS: 120.0000 mg | ORAL_CAPSULE | Freq: Three times a day (TID) | ORAL | Status: DC

## 2014-06-10 MED ORDER — METOCLOPRAMIDE HCL 5 MG/ML IJ SOLN
5.0000 mg | Freq: Three times a day (TID) | INTRAMUSCULAR | Status: DC | PRN
Start: 2014-06-10 — End: 2014-06-12
  Administered 2014-06-10 – 2014-06-11 (×3): 10 mg via INTRAVENOUS
  Filled 2014-06-10 (×3): qty 2

## 2014-06-10 MED ORDER — ROCURONIUM BROMIDE 100 MG/10ML IV SOLN
INTRAVENOUS | Status: DC | PRN
  Administered 2014-06-10: 5 mg via INTRAVENOUS
  Administered 2014-06-10: 25 mg via INTRAVENOUS

## 2014-06-10 MED ORDER — CEFAZOLIN SODIUM-DEXTROSE 2-3 GM-% IV SOLR
2.0000 g | INTRAVENOUS | Status: AC
  Administered 2014-06-10: 2 g via INTRAVENOUS

## 2014-06-10 MED ORDER — ROCURONIUM BROMIDE 100 MG/10ML IV SOLN
INTRAVENOUS | Status: AC
  Filled 2014-06-10: qty 1

## 2014-06-10 MED ORDER — POTASSIUM CHLORIDE 2 MEQ/ML IV SOLN
INTRAVENOUS | Status: DC
  Administered 2014-06-10 – 2014-06-11 (×2): via INTRAVENOUS
  Filled 2014-06-10 (×7): qty 1000

## 2014-06-10 MED ORDER — BUPIVACAINE-EPINEPHRINE (PF) 0.25% -1:200000 IJ SOLN
INTRAMUSCULAR | Status: DC | PRN
  Administered 2014-06-10: 30 mL via PERINEURAL

## 2014-06-10 MED ORDER — PROPOFOL 10 MG/ML IV BOLUS
INTRAVENOUS | Status: AC
  Filled 2014-06-10: qty 20

## 2014-06-10 MED ORDER — CEFAZOLIN SODIUM-DEXTROSE 2-3 GM-% IV SOLR
2.0000 g | Freq: Four times a day (QID) | INTRAVENOUS | Status: AC
  Administered 2014-06-10 – 2014-06-11 (×2): 2 g via INTRAVENOUS
  Filled 2014-06-10 (×2): qty 50

## 2014-06-10 MED ORDER — ASPIRIN EC 325 MG PO TBEC
325.0000 mg | DELAYED_RELEASE_TABLET | Freq: Two times a day (BID) | ORAL | Status: DC
  Administered 2014-06-11 – 2014-06-12 (×2): 325 mg via ORAL
  Filled 2014-06-10 (×2): qty 1

## 2014-06-10 MED ORDER — METOCLOPRAMIDE HCL 5 MG PO TABS
5.0000 mg | ORAL_TABLET | Freq: Three times a day (TID) | ORAL | Status: DC | PRN
Start: 2014-06-10 — End: 2014-06-12

## 2014-06-10 MED ORDER — LACTATED RINGERS IV SOLN
INTRAVENOUS | Status: DC
  Administered 2014-06-10: 1000 mL via INTRAVENOUS
  Administered 2014-06-10: 13:00:00 via INTRAVENOUS

## 2014-06-10 MED ORDER — HYDRALAZINE HCL 20 MG/ML IJ SOLN
INTRAMUSCULAR | Status: DC | PRN
  Administered 2014-06-10 (×3): 5 mg via INTRAVENOUS

## 2014-06-10 MED ORDER — CHLORHEXIDINE GLUCONATE 4 % EX LIQD: 60.0000 mL | Freq: Once | CUTANEOUS | Status: DC

## 2014-06-10 MED ORDER — TRANEXAMIC ACID 100 MG/ML IV SOLN
1000.0000 mg | Freq: Once | INTRAVENOUS | Status: AC
  Administered 2014-06-10: 1000 mg via INTRAVENOUS
  Filled 2014-06-10: qty 10

## 2014-06-10 MED ORDER — MIDAZOLAM HCL 5 MG/5ML IJ SOLN
INTRAMUSCULAR | Status: DC | PRN
  Administered 2014-06-10: 2 mg via INTRAVENOUS

## 2014-06-10 MED ORDER — POLYETHYLENE GLYCOL 3350 17 G PO PACK
17.0000 g | PACK | Freq: Two times a day (BID) | ORAL | Status: DC
  Administered 2014-06-11: 17 g via ORAL
  Filled 2014-06-10 (×2): qty 1

## 2014-06-10 MED ORDER — PROPOFOL 10 MG/ML IV BOLUS
INTRAVENOUS | Status: DC | PRN
  Administered 2014-06-10: 20 mg via INTRAVENOUS
  Administered 2014-06-10: 180 mg via INTRAVENOUS

## 2014-06-10 MED ORDER — BUPIVACAINE-EPINEPHRINE (PF) 0.25% -1:200000 IJ SOLN
INTRAMUSCULAR | Status: AC
  Filled 2014-06-10: qty 30

## 2014-06-10 MED ORDER — NITROGLYCERIN 0.4 MG SL SUBL: 0.4000 mg | SUBLINGUAL_TABLET | SUBLINGUAL | Status: DC | PRN

## 2014-06-10 MED ORDER — CELECOXIB 200 MG PO CAPS
200.0000 mg | ORAL_CAPSULE | Freq: Two times a day (BID) | ORAL | Status: DC
  Administered 2014-06-10 – 2014-06-12 (×3): 200 mg via ORAL
  Filled 2014-06-10 (×5): qty 1

## 2014-06-10 MED ORDER — SODIUM CHLORIDE 0.9 % IJ SOLN
INTRAMUSCULAR | Status: DC | PRN
  Administered 2014-06-10: 30 mL

## 2014-06-10 MED ORDER — DIPHENHYDRAMINE HCL 25 MG PO CAPS: 25.0000 mg | ORAL_CAPSULE | Freq: Four times a day (QID) | ORAL | Status: DC | PRN

## 2014-06-10 MED ORDER — SODIUM CHLORIDE 0.9 % IR SOLN
Status: DC | PRN
  Administered 2014-06-10: 1000 mL

## 2014-06-10 MED ORDER — METHOCARBAMOL 500 MG PO TABS
500.0000 mg | ORAL_TABLET | Freq: Four times a day (QID) | ORAL | Status: DC | PRN
  Administered 2014-06-11: 500 mg via ORAL
  Filled 2014-06-10: qty 1

## 2014-06-10 MED ORDER — LABETALOL HCL 5 MG/ML IV SOLN
INTRAVENOUS | Status: DC | PRN
  Administered 2014-06-10 (×3): 5 mg via INTRAVENOUS

## 2014-06-10 MED ORDER — HYDRALAZINE HCL 20 MG/ML IJ SOLN
INTRAMUSCULAR | Status: AC
  Filled 2014-06-10: qty 1

## 2014-06-10 MED ORDER — NEOSTIGMINE METHYLSULFATE 10 MG/10ML IV SOLN
INTRAVENOUS | Status: DC | PRN
  Administered 2014-06-10: 4 mg via INTRAVENOUS

## 2014-06-10 MED ORDER — PROMETHAZINE HCL 25 MG/ML IJ SOLN: 6.2500 mg | INTRAMUSCULAR | Status: DC | PRN

## 2014-06-10 MED ORDER — DEXAMETHASONE SODIUM PHOSPHATE 10 MG/ML IJ SOLN
10.0000 mg | Freq: Once | INTRAMUSCULAR | Status: AC
  Administered 2014-06-10: 10 mg via INTRAVENOUS

## 2014-06-10 MED ORDER — HYDROMORPHONE HCL 1 MG/ML IJ SOLN
INTRAMUSCULAR | Status: DC | PRN
  Administered 2014-06-10 (×2): 1 mg via INTRAVENOUS

## 2014-06-10 MED ORDER — MEPERIDINE HCL 50 MG/ML IJ SOLN: 6.2500 mg | INTRAMUSCULAR | Status: DC | PRN

## 2014-06-10 MED ORDER — SUCCINYLCHOLINE CHLORIDE 20 MG/ML IJ SOLN
INTRAMUSCULAR | Status: DC | PRN
  Administered 2014-06-10: 140 mg via INTRAVENOUS

## 2014-06-10 MED ORDER — VITAMINS A & D EX OINT
TOPICAL_OINTMENT | CUTANEOUS | Status: AC
  Administered 2014-06-10: 23:00:00
  Filled 2014-06-10: qty 5

## 2014-06-10 MED ORDER — HYDROMORPHONE HCL 2 MG/ML IJ SOLN
INTRAMUSCULAR | Status: AC
  Filled 2014-06-10: qty 1

## 2014-06-10 MED ORDER — LIDOCAINE HCL (CARDIAC) 20 MG/ML IV SOLN
INTRAVENOUS | Status: DC | PRN
  Administered 2014-06-10: 50 mg via INTRAVENOUS

## 2014-06-10 SURGICAL SUPPLY — 58 items
BAG SPEC THK2 15X12 ZIP CLS (MISCELLANEOUS) ×1
BAG ZIPLOCK 12X15 (MISCELLANEOUS) ×1 IMPLANT
BANDAGE ELASTIC 6 VELCRO ST LF (GAUZE/BANDAGES/DRESSINGS) ×2 IMPLANT
BANDAGE ESMARK 6X9 LF (GAUZE/BANDAGES/DRESSINGS) ×1 IMPLANT
BLADE SAW SGTL 13.0X1.19X90.0M (BLADE) ×2 IMPLANT
BNDG CMPR 9X6 STRL LF SNTH (GAUZE/BANDAGES/DRESSINGS) ×1
BNDG ESMARK 6X9 LF (GAUZE/BANDAGES/DRESSINGS) ×2
BOWL SMART MIX CTS (DISPOSABLE) ×2 IMPLANT
CAPT KNEE TOTAL 3 ATTUNE ×1 IMPLANT
CEMENT HV SMART SET (Cement) ×2 IMPLANT
CUFF TOURN SGL QUICK 44 (TOURNIQUET CUFF) ×1 IMPLANT
DECANTER SPIKE VIAL GLASS SM (MISCELLANEOUS) ×1 IMPLANT
DRAPE EXTREMITY T 121X128X90 (DRAPE) ×2 IMPLANT
DRAPE POUCH INSTRU U-SHP 10X18 (DRAPES) ×2 IMPLANT
DRAPE U-SHAPE 47X51 STRL (DRAPES) ×2 IMPLANT
DRSG AQUACEL AG ADV 3.5X10 (GAUZE/BANDAGES/DRESSINGS) ×2 IMPLANT
DURAPREP 26ML APPLICATOR (WOUND CARE) ×4 IMPLANT
ELECT REM PT RETURN 9FT ADLT (ELECTROSURGICAL) ×2
ELECTRODE REM PT RTRN 9FT ADLT (ELECTROSURGICAL) ×1 IMPLANT
FACESHIELD WRAPAROUND (MASK) ×8 IMPLANT
FACESHIELD WRAPAROUND OR TEAM (MASK) ×5 IMPLANT
GLOVE BIOGEL PI IND STRL 7.0 (GLOVE) IMPLANT
GLOVE BIOGEL PI IND STRL 7.5 (GLOVE) ×1 IMPLANT
GLOVE BIOGEL PI IND STRL 8.5 (GLOVE) ×1 IMPLANT
GLOVE BIOGEL PI INDICATOR 7.0 (GLOVE) ×2
GLOVE BIOGEL PI INDICATOR 7.5 (GLOVE) ×2
GLOVE BIOGEL PI INDICATOR 8.5 (GLOVE) ×1
GLOVE ECLIPSE 8.0 STRL XLNG CF (GLOVE) ×2 IMPLANT
GLOVE ORTHO TXT STRL SZ7.5 (GLOVE) ×4 IMPLANT
GLOVE SURG SS PI 7.0 STRL IVOR (GLOVE) ×1 IMPLANT
GLOVE SURG SS PI 7.5 STRL IVOR (GLOVE) ×1 IMPLANT
GOWN SPEC L3 XXLG W/TWL (GOWN DISPOSABLE) ×2 IMPLANT
GOWN SRG XL XLNG 56XLVL 4 (GOWN DISPOSABLE) IMPLANT
GOWN STRL NON-REIN XL XLG LVL4 (GOWN DISPOSABLE) ×2
GOWN STRL REUS W/TWL LRG LVL3 (GOWN DISPOSABLE) ×3 IMPLANT
HANDPIECE INTERPULSE COAX TIP (DISPOSABLE) ×2
HOVERMATT HALF SINGLE USE (PATIENT TRANSFER) ×1 IMPLANT
KIT BASIN OR (CUSTOM PROCEDURE TRAY) ×2 IMPLANT
LIQUID BAND (GAUZE/BANDAGES/DRESSINGS) ×2 IMPLANT
MANIFOLD NEPTUNE II (INSTRUMENTS) ×2 IMPLANT
NDL SAFETY ECLIPSE 18X1.5 (NEEDLE) ×2 IMPLANT
NEEDLE HYPO 18GX1.5 SHARP (NEEDLE) ×4
PACK TOTAL JOINT (CUSTOM PROCEDURE TRAY) ×2 IMPLANT
POSITIONER SURGICAL ARM (MISCELLANEOUS) ×2 IMPLANT
SET HNDPC FAN SPRY TIP SCT (DISPOSABLE) ×1 IMPLANT
SET PAD KNEE POSITIONER (MISCELLANEOUS) ×2 IMPLANT
SUCTION FRAZIER 12FR DISP (SUCTIONS) ×2 IMPLANT
SUT MNCRL AB 4-0 PS2 18 (SUTURE) ×2 IMPLANT
SUT VIC AB 1 CT1 36 (SUTURE) ×2 IMPLANT
SUT VIC AB 2-0 CT1 27 (SUTURE) ×6
SUT VIC AB 2-0 CT1 TAPERPNT 27 (SUTURE) ×3 IMPLANT
SUT VLOC 180 0 24IN GS25 (SUTURE) ×2 IMPLANT
SYR 50ML LL SCALE MARK (SYRINGE) ×4 IMPLANT
TOWEL OR 17X26 10 PK STRL BLUE (TOWEL DISPOSABLE) ×2 IMPLANT
TOWEL OR NON WOVEN STRL DISP B (DISPOSABLE) IMPLANT
TRAY FOLEY CATH 14FRSI W/METER (CATHETERS) ×2 IMPLANT
WATER STERILE IRR 1500ML POUR (IV SOLUTION) ×2 IMPLANT
WRAP KNEE MAXI GEL POST OP (GAUZE/BANDAGES/DRESSINGS) ×2 IMPLANT

## 2014-06-10 NOTE — Anesthesia Postprocedure Evaluation (Signed)
  Anesthesia Post-op Note  Patient: Sandra Lawrence  Procedure(s) Performed: Procedure(s) (LRB): RIGHT TOTAL KNEE ARTHROPLASTY (Right)  Patient Location: PACU  Anesthesia Type: General  Level of Consciousness: awake and alert   Airway and Oxygen Therapy: Patient Spontanous Breathing  Post-op Pain: mild  Post-op Assessment: Post-op Vital signs reviewed, Patient's Cardiovascular Status Stable, Respiratory Function Stable, Patent Airway and No signs of Nausea or vomiting  Last Vitals:  Filed Vitals:   06/10/14 1600  BP:   Pulse: 80  Temp: 36.7 C  Resp: 14    Post-op Vital Signs: stable   Complications: Some ST segment changes on 12 lead. Will send cardiac enzymes and consult cardiology. Pt doing well. Denies CP or SOB

## 2014-06-10 NOTE — Interval H&P Note (Signed)
History and Physical Interval Note:  06/10/2014 11:51 AM  Sandra Lawrence  has presented today for surgery, with the diagnosis of RIGH KNEE OA  The various methods of treatment have been discussed with the patient and family. After consideration of risks, benefits and other options for treatment, the patient has consented to  Procedure(s): RIGHT TOTAL KNEE ARTHROPLASTY (Right) as a surgical intervention .  The patient's history has been reviewed, patient examined, no change in status, stable for surgery.  I have reviewed the patient's chart and labs.  Questions were answered to the patient's satisfaction.     Mauri Pole

## 2014-06-10 NOTE — Anesthesia Preprocedure Evaluation (Addendum)
Anesthesia Evaluation  Patient identified by MRN, date of birth, ID band Patient awake    Reviewed: Allergy & Precautions, NPO status , Patient's Chart, lab work & pertinent test results  Airway Mallampati: I  TM Distance: >3 FB Neck ROM: Full    Dental no notable dental hx. (+) Edentulous Upper, Edentulous Lower   Pulmonary asthma , sleep apnea and Continuous Positive Airway Pressure Ventilation , former smoker,  breath sounds clear to auscultation  Pulmonary exam normal       Cardiovascular hypertension, Pt. on medications + CAD Rhythm:Regular Rate:Normal  3/15 Lexiscan myoview: Electrically negative for ischemia Myoview scan with normal perfusion. LVEF calculated at 65%.   Neuro/Psych negative neurological ROS  negative psych ROS   GI/Hepatic negative GI ROS, Neg liver ROS,   Endo/Other  diabetesMorbid obesity  Renal/GU negative Renal ROS  negative genitourinary   Musculoskeletal negative musculoskeletal ROS (+)   Abdominal   Peds negative pediatric ROS (+)  Hematology negative hematology ROS (+)   Anesthesia Other Findings   Reproductive/Obstetrics negative OB ROS                           Anesthesia Physical Anesthesia Plan  ASA: III  Anesthesia Plan: General   Post-op Pain Management:    Induction: Intravenous  Airway Management Planned: Oral ETT  Additional Equipment:   Intra-op Plan:   Post-operative Plan: Extubation in OR  Informed Consent: I have reviewed the patients History and Physical, chart, labs and discussed the procedure including the risks, benefits and alternatives for the proposed anesthesia with the patient or authorized representative who has indicated his/her understanding and acceptance.   Dental advisory given  Plan Discussed with: CRNA  Anesthesia Plan Comments: (Pt refuses sab.)        Anesthesia Quick Evaluation                                   Anesthesia Evaluation  Patient identified by MRN, date of birth, ID band Patient awake    Reviewed: Allergy & Precautions, H&P , NPO status , Patient's Chart, lab work & pertinent test results, reviewed documented beta blocker date and time   History of Anesthesia Complications Negative for: history of anesthetic complications  Airway Mallampati: I  Neck ROM: Full    Dental  (+) Edentulous Upper, Edentulous Lower   Pulmonary asthma , sleep apnea and Continuous Positive Airway Pressure Ventilation , COPD COPD inhaler, former smoker,  breath sounds clear to auscultation  Pulmonary exam normal       Cardiovascular hypertension, Pt. on medications and Pt. on home beta blockers - angina+ CAD Rhythm:Regular Rate:Normal  3/15 stress test: no ischemia, normal perfusion, EF 65%   Neuro/Psych negative neurological ROS     GI/Hepatic Neg liver ROS, GERD-  Poorly Controlled,  Endo/Other  diabetes (diet management)Morbid obesity  Renal/GU negative Renal ROS     Musculoskeletal   Abdominal (+) + obese,   Peds  Hematology   Anesthesia Other Findings   Reproductive/Obstetrics                           Anesthesia Physical Anesthesia Plan  ASA: III  Anesthesia Plan: General   Post-op Pain Management:    Induction: Intravenous  Airway Management Planned: Oral ETT  Additional Equipment:   Intra-op Plan:   Post-operative Plan:  Extubation in OR  Informed Consent: I have reviewed the patients History and Physical, chart, labs and discussed the procedure including the risks, benefits and alternatives for the proposed anesthesia with the patient or authorized representative who has indicated his/her understanding and acceptance.   Dental advisory given  Plan Discussed with: CRNA and Surgeon  Anesthesia Plan Comments: (Plan routine monitors, GETA)        Anesthesia Quick Evaluation
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
## 2014-06-10 NOTE — Transfer of Care (Signed)
Immediate Anesthesia Transfer of Care Note  Patient: Sandra Lawrence  Procedure(s) Performed: Procedure(s): RIGHT TOTAL KNEE ARTHROPLASTY (Right)  Patient Location: PACU  Anesthesia Type:General  Level of Consciousness: awake, alert  and oriented  Airway & Oxygen Therapy: Patient Spontanous Breathing and Patient connected to face mask oxygen  Post-op Assessment: Report given to PACU RN and Post -op Vital signs reviewed and stable  Post vital signs: Reviewed and stable  Complications: No apparent anesthesia complications

## 2014-06-10 NOTE — Op Note (Signed)
NAME:  Sandra Lawrence                      MEDICAL RECORD NO.:  [id number]                             FACILITY:  [facility]      PHYSICIAN:  Pietro Cassis. Alvan Dame, M.D.  DATE OF BIRTH:  10-30-1945      DATE OF PROCEDURE:  06/10/2014                                     OPERATIVE REPORT         PREOPERATIVE DIAGNOSIS:  Right knee osteoarthritis.      POSTOPERATIVE DIAGNOSIS:  Right knee osteoarthritis.  History of left total knee replacement     FINDINGS:  The patient was noted to have complete loss of cartilage and   bone-on-bone arthritis with associated osteophytes in all three compartments of   the knee with a significant synovitis and associated effusion.      PROCEDURE:  Right total knee replacement.      COMPONENTS USED:  DePuy Attune rotating platform posterior stabilized knee   system, a size 4 femur, 5 tibia, size 7 mm AOX PS insert, and 24mm anatomic patellar   button.      SURGEON:  Pietro Cassis. Alvan Dame, M.D.      ASSISTANT:  Danae Orleans, PA-C.      ANESTHESIA:  General.      SPECIMENS:  None.      COMPLICATION:  None.      DRAINS:  None.  EBL: 100-150cc      TOURNIQUET TIME:   Total Tourniquet Time Documented: Thigh (Right) - 33 minutes Total: Thigh (Right) - 33 minutes  .      The patient was stable to the recovery room.      INDICATION FOR PROCEDURE:  SOLA MARGOLIS is a 69 y.o. female patient of   mine.  The patient had been seen, evaluated, and treated conservatively in the   office with medication, activity modification, and injections.  The patient had   radiographic changes of bone-on-bone arthritis with endplate sclerosis and osteophytes noted.      The patient failed conservative measures including medication, injections, and activity modification, and at this point was ready for more definitive measures.   Based on the radiographic changes and failed conservative measures, the patient   decided to proceed with total knee replacement.  Risks of  infection,   DVT, component failure, need for revision surgery, postop course, and   expectations were all   discussed and reviewed.  Consent was obtained for benefit of pain   relief.      PROCEDURE IN DETAIL:  The patient was brought to the operative theater.   Once adequate anesthesia, preoperative antibiotics, 2 gm of Ancef, 1 gm of Tranexamic Acid, 10mg  of Decadron administered, the patient was positioned supine with the right thigh tourniquet placed.  The  right lower extremity was prepped and draped in sterile fashion.  A time-   out was performed identifying the patient, planned procedure, and   extremity.      The right lower extremity was placed in the [facility] leg holder.  The leg was   exsanguinated, tourniquet elevated to 250 mmHg.  A midline incision was  made followed by median parapatellar arthrotomy.  Following initial   exposure, attention was first directed to the patella.  Precut   measurement was noted to be 22 mm.  I resected down to 14 mm and used a   35 anatomic design patellar button to restore patellar height as well as cover the cut   surface.      The lug holes were drilled and a metal shim was placed to protect the   patella from retractors and saw blades.      At this point, attention was now directed to the femur.  The femoral   canal was opened with a drill, irrigated to try to prevent fat emboli.  An   intramedullary rod was passed at 5 degrees valgus, 9 mm of bone was   resected off the distal femur.  Following this resection, the tibia was   subluxated anteriorly.  Using the extramedullary guide, 8 mm of bone was resected off   the proximal lateral tibia.  We confirmed the gap would be   stable medially and laterally with a size 6 mm insert as well as confirmed   the cut was perpendicular in the coronal plane, checking with an alignment rod.      Once this was done, I sized the femur to be a size 4 in the anterior-   posterior dimension, chose a  standard component based on medial and   lateral dimension.  The size 4 rotation block was then pinned in   position anterior referenced using the C-clamp to set rotation.  The   anterior, posterior, and  chamfer cuts were made without difficulty nor   notching making certain that I was along the anterior cortex to help   with flexion gap stability.      The final box cut was made off the lateral aspect of distal femur.      At this point, the tibia was sized to be a size 5, the size 5 tray was   then pinned in position through the medial third of the tubercle,   drilled, and keel punched.  Trial reduction was now carried with a 4 femur,  5 tibia, a size 7 mm insert, and the 35 patella botton.  The knee was brought to   extension, full extension with good flexion stability with the patella   tracking through the trochlea without application of pressure.  Given   all these findings, the trial components removed.  Final components were   opened and cement was mixed.  The knee was irrigated with normal saline   solution and pulse lavage.  The synovial lining was   then injected with 30cc of 0.25% Marcaine with epinephrine and 1 cc of Toradol plus 30cc of NS for a    total of 61 cc.      The knee was irrigated.  Final implants were then cemented onto clean and   dried cut surfaces of bone with the knee brought to extension with a size 7 mm trial insert.      Once the cement had fully cured, the excess cement was removed   throughout the knee.  I confirmed I was satisfied with the range of   motion and stability, and the final 7 mm AOX PS insert was chosen.  It was   placed into the knee.      The tourniquet had been let down at 31 minutes.  No significant   hemostasis required.  The   extensor mechanism was then reapproximated using #1 Vicryl and #0 V-lock sutures with the knee   in flexion.  The   remaining wound was closed with 2-0 Vicryl and running 4-0 Monocryl.   The knee was  cleaned, dried, dressed sterilely using Dermabond and   Aquacel dressing.  The patient was then   brought to recovery room in stable condition, tolerating the procedure   well.   Please note that Physician Assistant, Danae Orleans, was present for the entirety of the case, and was utilized for pre-operative positioning, peri-operative retractor management, general facilitation of the procedure.  He was also utilized for primary wound closure at the end of the case.              Pietro Cassis Alvan Dame, M.D.    06/10/2014 2:01 PM

## 2014-06-10 NOTE — Progress Notes (Signed)
Utilization review completed.  

## 2014-06-11 ENCOUNTER — Encounter (HOSPITAL_COMMUNITY): Payer: Self-pay | Admitting: Orthopedic Surgery

## 2014-06-11 LAB — BASIC METABOLIC PANEL
ANION GAP: 8 (ref 5–15)
BUN: 20 mg/dL (ref 6–23)
CO2: 25 mmol/L (ref 19–32)
Calcium: 8.8 mg/dL (ref 8.4–10.5)
Chloride: 105 mmol/L (ref 96–112)
Creatinine, Ser: 0.74 mg/dL (ref 0.50–1.10)
GFR calc Af Amer: >90 mL/min (ref >90)
GFR calc non Af Amer: 87 mL/min — ABNORMAL LOW (ref >90)
Glucose, Bld: 141 mg/dL — ABNORMAL HIGH (ref 70–99)
POTASSIUM: 4 mmol/L (ref 3.5–5.1)
SODIUM: 138 mmol/L (ref 135–145)

## 2014-06-11 LAB — CBC
HCT: 36.1 % (ref 36.0–46.0)
Hemoglobin: 11.5 g/dL — ABNORMAL LOW (ref 12.0–15.0)
MCH: 28.3 pg (ref 26.0–34.0)
MCHC: 31.9 g/dL (ref 30.0–36.0)
MCV: 88.7 fL (ref 78.0–100.0)
Platelets: 220 10*3/uL (ref 150–400)
RBC: 4.07 MIL/uL (ref 3.87–5.11)
RDW: 14.4 % (ref 11.5–15.5)
WBC: 10.2 10*3/uL (ref 4.0–10.5)

## 2014-06-11 LAB — TROPONIN I

## 2014-06-11 MED ORDER — CETYLPYRIDINIUM CHLORIDE 0.05 % MT LIQD
7.0000 mL | Freq: Two times a day (BID) | OROMUCOSAL | Status: DC
  Administered 2014-06-11 – 2014-06-12 (×2): 7 mL via OROMUCOSAL

## 2014-06-11 NOTE — Progress Notes (Signed)
OT Cancellation Note  Patient Details Name: Sandra Lawrence MRN: [id number] DOB: 10-12-1945   Cancelled Treatment:    Reason Eval/Treat Not Completed: Other (comment) Will defer OT eval to SNF.   Jules Schick  [phone] 06/11/2014, 1:12 PM

## 2014-06-11 NOTE — Progress Notes (Signed)
Physical Therapy Treatment Patient Details Name: Sandra Lawrence MRN: [id number] DOB: 08-12-1945 Today's Date: 06/11/2014    History of Present Illness post op diffuse ST segment changes post  RTKA on 06/10/14.    PT Comments    Patient  Tolerated  In recliner x 2 hours.  Continue to monitor BP and  HR while mobilizing.  Follow Up Recommendations  SNF;Supervision/Assistance - 24 hour     Equipment Recommendations  None recommended by PT    Recommendations for Other Services       Precautions / Restrictions Precautions Precautions: Fall;Knee Precaution Comments: m,onitor VS, BP high    Mobility  Bed Mobility Overal bed mobility: Needs Assistance Bed Mobility: Sit to Supine     Supine to sit: Mod assist     General bed mobility comments: extra time for moving, assist R leg onto bed  Transfers Overall transfer level: Needs assistance Equipment used: Rolling walker (2 wheeled) Transfers: Sit to/from [facility] Sit to Stand: +2 safety/equipment;+2 physical assistance;Mod assist Stand pivot transfers: Min assist;+2 safety/equipment       General transfer comment: cues for  hand placement , lifting assist from bed., several steps to turn to get to recliner,   Ambulation/Gait                 Stairs            Wheelchair Mobility    Modified Rankin (Stroke Patients Only)       Balance                                    Cognition Arousal/Alertness: Awake/alert Behavior During Therapy: WFL for tasks assessed/performed Overall Cognitive Status: Within Functional Limits for tasks assessed                      Exercises Total Joint Exercises Ankle Circles/Pumps: AROM;Both;10 reps Quad Sets: AROM;Both;10 reps Towel Squeeze: AROM;Both;10 reps    General Comments        Pertinent Vitals/Pain Pain Assessment: 0-10 Pain Score: 3  Pain Location: R knee Pain Descriptors / Indicators:  Aching;Discomfort Pain Intervention(s): Limited activity within patient's tolerance;Monitored during session;Premedicated before session;Repositioned    Home Living Family/patient expects to be discharged to:: Skilled nursing facility Living Arrangements: Alone;Children                  Prior Function Level of Independence: Independent with assistive device(s)          PT Goals (current goals can now be found in the care plan section) Acute Rehab PT Goals Patient Stated Goal: to go to rehab PT Goal Formulation: With patient Time For Goal Achievement: 06/18/14 Potential to Achieve Goals: Good Progress towards PT goals: Progressing toward goals    Frequency  7X/week    PT Plan Current plan remains appropriate    Co-evaluation             End of Session   Activity Tolerance: Patient limited by fatigue;Patient tolerated treatment well Patient left: in bed;with call bell/phone within reach     Time: 1335-1352 PT Time Calculation (min) (ACUTE ONLY): 17 min  Charges:  $Therapeutic Activity: 8-22 mins                    G Codes:      Claretha Cooper 06/11/2014, 4:31 PM

## 2014-06-11 NOTE — Progress Notes (Signed)
RT attempted to place patient on CPAP but patient is nauseated at this time. RT will try again later.

## 2014-06-11 NOTE — Progress Notes (Signed)
    Subjective:  Nausea. No CP, no SOB  Objective:  Vital Signs in the last 24 hours: Temp:  [97.5 F (36.4 C)-98.1 F (36.7 C)] 98 F (36.7 C) (01/26 0400) Pulse Rate:  [57-85] 58 (01/26 0700) Resp:  [9-22] 10 (01/26 0700) BP: (136-186)/(48-121) 141/48 mmHg (01/26 0700) SpO2:  [91 %-100 %] 99 % (01/26 0700)  Intake/Output from previous day: 01/25 0701 - 01/26 0700 In: 3450 [I.V.:3400; IV Piggyback:50] Out: 1576 [Urine:1426; Blood:150]   Physical Exam: General: Well developed, well nourished, in no acute distress. Head:  Normocephalic and atraumatic. Lungs: Clear to auscultation and percussion. Heart: Normal S1 and S2.  No murmur, rubs or gallops.  Abdomen: soft, non-tender, positive bowel sounds. Extremities: No clubbing or cyanosis. R leg post op Neurologic: Alert and oriented x 3.    Lab Results:  Recent Labs  06/11/14 0350  WBC 10.2  HGB 11.5*  PLT 220    Recent Labs  06/11/14 0350  NA 138  K 4.0  CL 105  CO2 25  GLUCOSE 141*  BUN 20  CREATININE 0.74    Recent Labs  06/10/14 2020 06/11/14 0347  TROPONINI <0.03 <0.03     Telemetry: No adverse rhythms Personally viewed.   EKG- NSR with inferolateral ST depression/TWI c/w LVH with repol changes   Assessment/Plan:  Principal Problem:   S/P right TKA Active Problems:   S/P knee replacement   69 year old moderately overweight African-American female with multiple cardiac risk factors including hypertension, diabetes, hyperlipidemia, remote tobacco abuse who is followed by Dr. Sallyanne Kuster in our office. She had a cardiac catheterization performed in 2009 that showed minimal CAD. She had a stress test performed March 2015 which was nonischemic. She does have left ventricular hypertrophy with repolarization changes on her baseline EKG secondary to hypertension. She saw Dr. Sallyanne Kuster in the office 05/30/14 for preoperative clearance prior to her elective right total knee replacement which was performed  today successfully by Dr. Alvan Dame. Upon arrival back to the PACU a postop EKG did show diffuse ST segment changes. She denies chest pain.   - Troponin cycle complete and normal.  - No evidence of ACS (acute coronary syndrome)  - ECG baseline abnormality from LVH and repolarization changes noted.   - No further workup  - OK to transfer   - Will sign off, please call if questions.   Casanova Schurman, [place] 06/11/2014, 7:07 AM

## 2014-06-11 NOTE — Progress Notes (Signed)
  Clinical Social Work Department BRIEF PSYCHOSOCIAL ASSESSMENT 06/11/2014  Patient:  Sandra Lawrence, Sandra Lawrence     Account Number:  [id number]     Admit date:  06/10/2014  Clinical Social Worker:  Maryln Manuel  Date/Time:  06/11/2014 02:22 PM  Referred by:  Physician  Date Referred:  06/11/2014 Referred for  SNF Placement   Other Referral:   Interview type:  Patient Other interview type:    PSYCHOSOCIAL DATA Living Status:  ALONE Admitted from facility:   Level of care:   Primary support name:  Saralyn Pilar Holt/[phone] Primary support relationship to patient:  CHILD, ADULT Degree of support available:   adequate    CURRENT CONCERNS Current Concerns  Post-Acute Placement   Other Concerns:    SOCIAL WORK ASSESSMENT / PLAN CSW received referral for New SNF.    CSW met with pt at bedside. CSW introduced self and explained role. CSW provided support as she discussed that she had just participated with PT and feels tired now. CSW confirmed with pt that her plans are to discharge to [facility] when medically stable. CSW discussed that CSW will send clinicals to [facility] and keep facility updated until pt is medically ready for discharge. Pt expressed understanding and in agreement to plan.    CSW completed FL2 and sent clinicals to [facility].    CSW to continue to follow to provide support and assist with pt transition to [facility] when pt medically stable for discharge.   Assessment/plan status:  Psychosocial Support/Ongoing Assessment of Needs Other assessment/ plan:   discharge planning   Information/referral to community resources:   Referral to [facility].    PATIENT'S/FAMILY'S RESPONSE TO PLAN OF CARE: Pt alert and oriented x 4. Pt expressed being tired secondary to PT treatment. Pt plans to discharge to [facility] when medically stable. Pt appreciative of CSW support and assistance.   Alison Murray, MSW, [place] [facility] [phone]

## 2014-06-11 NOTE — Progress Notes (Addendum)
Clinical Social Work Department CLINICAL SOCIAL WORK PLACEMENT NOTE 06/11/2014  Patient:  Sandra Lawrence, Sandra Lawrence  Account Number:  [id number] Admit date:  06/10/2014  Clinical Social Worker:  Maryln Manuel  Date/time:  06/11/2014 02:27 PM  Clinical Social Work is seeking post-discharge placement for this patient at the following level of care:   SKILLED NURSING   (*CSW will update this form in Epic as items are completed)   06/11/2014  Patient/family provided with [facility] Department of Clinical Social Work's list of facilities offering this level of care within the geographic area requested by the patient (or if unable, by the patient's family).  06/11/2014  Patient/family informed of their freedom to choose among providers that offer the needed level of care, that participate in Medicare, Medicaid or managed care program needed by the patient, have an available bed and are willing to accept the patient.  06/11/2014  Patient/family informed of [facility]' ownership interest in [facility], as well as of the fact that they are under no obligation to receive care at this facility.  PASARR submitted to EDS on 06/11/2014 PASARR number received on 06/11/2014  FL2 transmitted to all facilities in geographic area requested by pt/family on  06/11/2014 FL2 transmitted to all facilities within larger geographic area on   Patient informed that his/her managed care company has contracts with or will negotiate with  certain facilities, including the following:     Patient/family informed of bed offers received:  06/11/2014 Patient chooses bed at [facility] [place] Physician recommends and patient chooses bed at    Patient to be transferred to  on  [place] on 06/12/2014 Patient to be transferred to facility by ambulance ([facility]) Patient and family notified of transfer on 06/12/2014 Name of family member notified:  Pt notified at bedside. Pt stated that she notified her family via  telephone.  The following physician request were entered in Epic:   Additional Comments:   Alison Murray, MSW, [place] Work [phone]

## 2014-06-11 NOTE — Evaluation (Signed)
Physical Therapy Evaluation Patient Details Name: Sandra Lawrence MRN: [id number] DOB: 06/02/1945 Today's Date: 06/11/2014   History of Present Illness  post op diffuse ST segment changes post  RTKA on 06/10/14.  Clinical Impression  Pt's BP increased during mobility: supine181/72, after pivot 192/92, HR 68-72, stas 100% 2l-93 RA. RN aware of BP. Patient tolerated fair, had nausea but not worsening. Patient will benefit from PT  To address problems listed.    Follow Up Recommendations SNF;Supervision/Assistance - 24 hour    Equipment Recommendations  None recommended by PT    Recommendations for Other Services       Precautions / Restrictions Precautions Precautions: Fall;Knee Precaution Comments: m,onitor VS, BP high      Mobility  Bed Mobility Overal bed mobility: Needs Assistance Bed Mobility: Supine to Sit     Supine to sit: Min assist     General bed mobility comments: extra time for moving to edge of bed, support R leg  Transfers Overall transfer level: Needs assistance Equipment used: Rolling walker (2 wheeled) Transfers: Sit to/from [facility] Sit to Stand: +2 safety/equipment;+2 physical assistance;Mod assist Stand pivot transfers: Min assist;+2 safety/equipment       General transfer comment: cues for  hand placement , lifting assist from bed., several steps to turn to get to recliner,   Ambulation/Gait                Stairs            Wheelchair Mobility    Modified Rankin (Stroke Patients Only)       Balance                                             Pertinent Vitals/Pain Pain Assessment: 0-10 Pain Score: 3  Pain Location: R knee Pain Descriptors / Indicators: Aching Pain Intervention(s): Limited activity within patient's tolerance;Monitored during session;Premedicated before session;Repositioned    Home Living Family/patient expects to be discharged to:: Skilled nursing facility Living  Arrangements: Alone;Children                    Prior Function Level of Independence: Independent with assistive device(s)               Hand Dominance        Extremity/Trunk Assessment               Lower Extremity Assessment: RLE deficits/detail RLE Deficits / Details: able to perform SLR, knee flexion  60       Communication   Communication: No difficulties  Cognition Arousal/Alertness: Awake/alert Behavior During Therapy: WFL for tasks assessed/performed Overall Cognitive Status: Within Functional Limits for tasks assessed                      General Comments      Exercises Total Joint Exercises Ankle Circles/Pumps: AROM;Both;10 reps Quad Sets: AROM;Both;10 reps Towel Squeeze: AROM;Both;10 reps      Assessment/Plan    PT Assessment Patient needs continued PT services  PT Diagnosis Difficulty walking;Generalized weakness;Acute pain   PT Problem List Decreased strength;Decreased range of motion;Decreased activity tolerance;Decreased mobility;Decreased knowledge of precautions;Decreased safety awareness;Decreased knowledge of use of DME;Cardiopulmonary status limiting activity  PT Treatment Interventions DME instruction;Gait training;Functional mobility training;Therapeutic activities;Therapeutic exercise;Patient/family education   PT Goals (Current goals can be found in the Care Plan section)  Acute Rehab PT Goals Patient Stated Goal: to go to rehab PT Goal Formulation: With patient Time For Goal Achievement: 06/18/14 Potential to Achieve Goals: Good    Frequency 7X/week   Barriers to discharge Decreased caregiver support      Co-evaluation               End of Session   Activity Tolerance: Patient limited by fatigue Patient left: in chair;with call bell/phone within reach Nurse Communication: Mobility status         Time: 1100-1129 PT Time Calculation (min) (ACUTE ONLY): 29 min   Charges:   PT  Evaluation $Initial PT Evaluation Tier I: 1 Procedure PT Treatments $Therapeutic Activity: 23-37 mins   PT G Codes:        Claretha Cooper 06/11/2014, 1:24 PM Tresa Endo PT [phone]

## 2014-06-11 NOTE — Plan of Care (Signed)
Problem: Consults Goal: Total Joint Replacement Patient Education See Patient Education Module for education specifics. Outcome: Completed/Met Date Met:  06/11/14 S/p Rt TKA    Goal: Diagnosis- Total Joint Replacement Outcome: Completed/Met Date Met:  06/11/14 Primary Total Knee

## 2014-06-11 NOTE — Progress Notes (Signed)
Patient ID: Sandra Lawrence, female   DOB: Jun 12, 1945, 69 y.o.   MRN: [id number] Subjective: 1 Day Post-Op Procedure(s) (LRB): RIGHT TOTAL KNEE ARTHROPLASTY (Right)    Patient reports pain as mild to moderate.  Relatively comfortable from that standpoint.  Dealing with some nausea though No chest pain or shortness of breath  Appreciate Cardiology input and recs  Objective:   VITALS:   Filed Vitals:   06/11/14 0700  BP: 141/48  Pulse: 58  Temp:   Resp: 10    Neurovascular intact Incision: dressing C/D/I - right knee  LABS  Recent Labs  06/11/14 0350  HGB 11.5*  HCT 36.1  WBC 10.2  PLT 220     Recent Labs  06/11/14 0350  NA 138  K 4.0  BUN 20  CREATININE 0.74  GLUCOSE 141*    No results for input(s): LABPT, INR in the last 72 hours.   Assessment/Plan: 1 Day Post-Op Procedure(s) (LRB): RIGHT TOTAL KNEE ARTHROPLASTY (Right)   Advance diet Up with therapy Discharge to SNF probably Wednesday or Thursday  Cardiac enzymes appear to be stable as predicted by Dr. Gwenlyn Found - if ok from Cardiology standpoint fine to transfer to Ortho floor later today

## 2014-06-12 DIAGNOSIS — M25561 Pain in right knee: Secondary | ICD-10-CM | POA: Diagnosis not present

## 2014-06-12 DIAGNOSIS — D62 Acute posthemorrhagic anemia: Secondary | ICD-10-CM | POA: Diagnosis not present

## 2014-06-12 DIAGNOSIS — Z471 Aftercare following joint replacement surgery: Secondary | ICD-10-CM | POA: Diagnosis not present

## 2014-06-12 DIAGNOSIS — F328 Other depressive episodes: Secondary | ICD-10-CM | POA: Diagnosis not present

## 2014-06-12 DIAGNOSIS — S83104A Unspecified dislocation of right knee, initial encounter: Secondary | ICD-10-CM | POA: Diagnosis not present

## 2014-06-12 DIAGNOSIS — K219 Gastro-esophageal reflux disease without esophagitis: Secondary | ICD-10-CM | POA: Diagnosis not present

## 2014-06-12 DIAGNOSIS — R2681 Unsteadiness on feet: Secondary | ICD-10-CM | POA: Diagnosis not present

## 2014-06-12 DIAGNOSIS — Z96651 Presence of right artificial knee joint: Secondary | ICD-10-CM | POA: Diagnosis not present

## 2014-06-12 DIAGNOSIS — E780 Pure hypercholesterolemia: Secondary | ICD-10-CM | POA: Diagnosis not present

## 2014-06-12 DIAGNOSIS — R531 Weakness: Secondary | ICD-10-CM | POA: Diagnosis not present

## 2014-06-12 DIAGNOSIS — R278 Other lack of coordination: Secondary | ICD-10-CM | POA: Diagnosis not present

## 2014-06-12 DIAGNOSIS — M6281 Muscle weakness (generalized): Secondary | ICD-10-CM | POA: Diagnosis not present

## 2014-06-12 DIAGNOSIS — M1711 Unilateral primary osteoarthritis, right knee: Secondary | ICD-10-CM | POA: Diagnosis not present

## 2014-06-12 DIAGNOSIS — K5901 Slow transit constipation: Secondary | ICD-10-CM | POA: Diagnosis not present

## 2014-06-12 DIAGNOSIS — M25661 Stiffness of right knee, not elsewhere classified: Secondary | ICD-10-CM | POA: Diagnosis not present

## 2014-06-12 DIAGNOSIS — E119 Type 2 diabetes mellitus without complications: Secondary | ICD-10-CM | POA: Diagnosis not present

## 2014-06-12 DIAGNOSIS — R05 Cough: Secondary | ICD-10-CM | POA: Diagnosis not present

## 2014-06-12 DIAGNOSIS — I2510 Atherosclerotic heart disease of native coronary artery without angina pectoris: Secondary | ICD-10-CM | POA: Diagnosis not present

## 2014-06-12 DIAGNOSIS — E785 Hyperlipidemia, unspecified: Secondary | ICD-10-CM | POA: Diagnosis not present

## 2014-06-12 DIAGNOSIS — G4733 Obstructive sleep apnea (adult) (pediatric): Secondary | ICD-10-CM | POA: Diagnosis not present

## 2014-06-12 DIAGNOSIS — E669 Obesity, unspecified: Secondary | ICD-10-CM | POA: Diagnosis not present

## 2014-06-12 DIAGNOSIS — I119 Hypertensive heart disease without heart failure: Secondary | ICD-10-CM | POA: Diagnosis not present

## 2014-06-12 DIAGNOSIS — M810 Age-related osteoporosis without current pathological fracture: Secondary | ICD-10-CM | POA: Diagnosis not present

## 2014-06-12 DIAGNOSIS — I10 Essential (primary) hypertension: Secondary | ICD-10-CM | POA: Diagnosis not present

## 2014-06-12 LAB — BASIC METABOLIC PANEL
ANION GAP: 7 (ref 5–15)
BUN: 28 mg/dL — ABNORMAL HIGH (ref 6–23)
CALCIUM: 8.9 mg/dL (ref 8.4–10.5)
CO2: 27 mmol/L (ref 19–32)
Chloride: 106 mmol/L (ref 96–112)
Creatinine, Ser: 0.8 mg/dL (ref 0.50–1.10)
GFR calc Af Amer: 87 mL/min — ABNORMAL LOW (ref >90)
GFR calc non Af Amer: 75 mL/min — ABNORMAL LOW (ref >90)
Glucose, Bld: 128 mg/dL — ABNORMAL HIGH (ref 70–99)
POTASSIUM: 4.3 mmol/L (ref 3.5–5.1)
Sodium: 140 mmol/L (ref 135–145)

## 2014-06-12 LAB — CBC
HEMATOCRIT: 30.2 % — AB (ref 36.0–46.0)
HEMOGLOBIN: 9.5 g/dL — AB (ref 12.0–15.0)
MCH: 28.1 pg (ref 26.0–34.0)
MCHC: 31.5 g/dL (ref 30.0–36.0)
MCV: 89.3 fL (ref 78.0–100.0)
Platelets: 234 10*3/uL (ref 150–400)
RBC: 3.38 MIL/uL — AB (ref 3.87–5.11)
RDW: 14.5 % (ref 11.5–15.5)
WBC: 8 10*3/uL (ref 4.0–10.5)

## 2014-06-12 MED ORDER — OXYCODONE HCL 5 MG PO TABS: 5.0000 mg | ORAL_TABLET | ORAL | Status: DC | PRN

## 2014-06-12 MED ORDER — POLYETHYLENE GLYCOL 3350 17 G PO PACK: 17.0000 g | PACK | Freq: Two times a day (BID) | ORAL | Status: DC

## 2014-06-12 MED ORDER — TIZANIDINE HCL 4 MG PO TABS: 4.0000 mg | ORAL_TABLET | Freq: Four times a day (QID) | ORAL | Status: DC | PRN

## 2014-06-12 MED ORDER — FERROUS SULFATE 325 (65 FE) MG PO TABS
325.0000 mg | ORAL_TABLET | Freq: Three times a day (TID) | ORAL | Status: DC
Start: 2014-06-12 — End: 2016-06-28

## 2014-06-12 MED ORDER — DOCUSATE SODIUM 100 MG PO CAPS: 100.0000 mg | ORAL_CAPSULE | Freq: Two times a day (BID) | ORAL | Status: DC

## 2014-06-12 MED ORDER — ASPIRIN 325 MG PO TBEC: 325.0000 mg | DELAYED_RELEASE_TABLET | Freq: Two times a day (BID) | ORAL | Status: AC

## 2014-06-12 NOTE — Progress Notes (Signed)
Patient refuses CPAP tonight. RN aware

## 2014-06-12 NOTE — Progress Notes (Signed)
Physical Therapy Treatment Patient Details Name: Sandra Lawrence MRN: [id number] DOB: September 07, 1945 Today's Date: 06/12/2014    History of Present Illness post op diffuse ST segment changes post  RTKA on 06/10/14.    PT Comments    Patient progressing well, BP 167/89,  Amb. X 25'   Follow Up Recommendations  SNF;Supervision/Assistance - 24 hour     Equipment Recommendations  None recommended by PT    Recommendations for Other Services       Precautions / Restrictions Precautions Precautions: Fall;Knee Precaution Comments: m,onitor VS, BP high Restrictions Weight Bearing Restrictions: No    Mobility  Bed Mobility Overal bed mobility: Needs Assistance Bed Mobility: Supine to Sit     Supine to sit: Min assist     General bed mobility comments: extra time for moving, assist R leg   Transfers Overall transfer level: Needs assistance Equipment used: Rolling walker (2 wheeled) Transfers: Sit to/from Stand Sit to Stand: Min assist         General transfer comment: cues for  hand placement ,   Ambulation/Gait Ambulation/Gait assistance: Min assist;+2 safety/equipment Ambulation Distance (Feet): 25 Feet Assistive device: Rolling walker (2 wheeled) Gait Pattern/deviations: Step-to pattern;Antalgic;Decreased step length - right     General Gait Details: cueas for sequence and posture   Stairs            Wheelchair Mobility    Modified Rankin (Stroke Patients Only)       Balance                                    Cognition Arousal/Alertness: Awake/alert                          Exercises      General Comments        Pertinent Vitals/Pain Pain Assessment: 0-10 Pain Score: 3  Pain Location: R knee Pain Descriptors / Indicators: Aching Pain Intervention(s): Monitored during session;Premedicated before session;Ice applied    Home Living                      Prior Function            PT Goals (current  goals can now be found in the care plan section) Progress towards PT goals: Progressing toward goals    Frequency  7X/week    PT Plan Current plan remains appropriate    Co-evaluation             End of Session   Activity Tolerance: Patient tolerated treatment well Patient left: with call bell/phone within reach     Time: 0843-0909 PT Time Calculation (min) (ACUTE ONLY): 26 min  Charges:  $Gait Training: 23-37 mins                    G Codes:      Claretha Cooper 06/12/2014, 11:51 AM

## 2014-06-12 NOTE — Progress Notes (Signed)
CARE MANAGEMENT NOTE 06/12/2014  Patient:  Sandra Lawrence, Sandra Lawrence   Account Number:  [id number]  Date Initiated:  06/12/2014  Documentation initiated by:  DAVIS,RHONDA  Subjective/Objective Assessment:   s/p right tkr and then post op-EKG- NSR with inferolateral ST depression/TWI c/w LVH with repol changes     Action/Plan:   [facility]   Anticipated DC Date:  06/12/2014   Anticipated DC Plan:  SKILLED NURSING FACILITY  In-house referral  Clinical Social Worker      DC Planning Services  CM consult      [facility] Choice  NA   Choice offered to / List presented to:  NA   DME arranged  NA      DME agency  NA     [place] arranged  NA      [place] agency  NA   Status of service:  Completed, signed off Medicare Important Message given?  YES (If response is "NO", the following Medicare IM given date fields will be blank) Date Medicare IM given:  06/12/2014 Medicare IM given by:  DAVIS,RHONDA Date Additional Medicare IM given:   Additional Medicare IM given by:    Discharge Disposition:  [place]  Per UR Regulation:  Reviewed for med. necessity/level of care/duration of stay  If discussed at [place] of Stay Meetings, dates discussed:    Comments:  Jan. 27, 16/Rhonda L. Rosana Hoes, RN, BSN, CCM Case [employer] [phone]

## 2014-06-12 NOTE — Progress Notes (Signed)
Pt stable for transport to [facility]. Full report given to [employer]. VSS.

## 2014-06-12 NOTE — Progress Notes (Signed)
Pt for discharge to [facility].   CSW facilitated pt discharge needs including contacting facility, faxing pt discharge information via [facility], discussing with pt at bedside, providing RN phone number to call report, and arranging ambulance transport for pt scheduled for 2 pm for transport to [facility].   Pt reports that she notified her family. Pt expressed that she wishes to get cleaned up prior to ambulance arriving. CSW notified RN, but agreeable to 2 pm pick up by [facility].  No further social work needs identified at this time.   CSW signing off.   Alison Murray, MSW, [place] Work [phone]

## 2014-06-12 NOTE — Progress Notes (Signed)
     Subjective: 2 Days Post-Op Procedure(s) (LRB): RIGHT TOTAL KNEE ARTHROPLASTY (Right)   Patient reports pain as mild, pain controlled. No events throughout the night. Feels that she is working well with PT and that the knee is doing well. Ready to be discharged to skilled nursing facility.  Objective:   VITALS:   Filed Vitals:   06/12/14  BP: 146/54  Pulse: 57  Temp: 98.4 F (36.9 C)  Resp: 16    Dorsiflexion/Plantar flexion intact Incision: dressing C/D/I No cellulitis present Compartment soft  LABS  Recent Labs  06/11/14 0350 06/12/14 0353  HGB 11.5* 9.5*  HCT 36.1 30.2*  WBC 10.2 8.0  PLT 220 234     Recent Labs  06/11/14 0350 06/12/14 0353  NA 138 140  K 4.0 4.3  BUN 20 28*  CREATININE 0.74 0.80  GLUCOSE 141* 128*     Assessment/Plan: 2 Days Post-Op Procedure(s) (LRB): RIGHT TOTAL KNEE ARTHROPLASTY (Right) Up with therapy Discharge to SNF  Follow up in 2 weeks at [facility]. Follow up with OLIN,Hilda Wexler D in 2 weeks.  Contact information:  [place] [address] [place] Doyel Mulkern   PAC  06/12/2014, 9:51 AM
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
## 2014-06-14 ENCOUNTER — Non-Acute Institutional Stay (SKILLED_NURSING_FACILITY): Payer: Medicare Other | Admitting: Adult Health

## 2014-06-14 ENCOUNTER — Encounter: Payer: Self-pay | Admitting: Adult Health

## 2014-06-14 DIAGNOSIS — R05 Cough: Secondary | ICD-10-CM

## 2014-06-14 DIAGNOSIS — K5900 Constipation, unspecified: Secondary | ICD-10-CM

## 2014-06-14 DIAGNOSIS — E785 Hyperlipidemia, unspecified: Secondary | ICD-10-CM

## 2014-06-14 DIAGNOSIS — I10 Essential (primary) hypertension: Secondary | ICD-10-CM

## 2014-06-14 DIAGNOSIS — M1711 Unilateral primary osteoarthritis, right knee: Secondary | ICD-10-CM

## 2014-06-14 DIAGNOSIS — Z96651 Presence of right artificial knee joint: Secondary | ICD-10-CM | POA: Diagnosis not present

## 2014-06-14 DIAGNOSIS — D62 Acute posthemorrhagic anemia: Secondary | ICD-10-CM

## 2014-06-14 DIAGNOSIS — R059 Cough, unspecified: Secondary | ICD-10-CM

## 2014-06-14 DIAGNOSIS — Z6841 Body Mass Index (BMI) 40.0 and over, adult: Secondary | ICD-10-CM
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
## 2014-06-17 ENCOUNTER — Non-Acute Institutional Stay (SKILLED_NURSING_FACILITY): Payer: Medicare Other | Admitting: Internal Medicine

## 2014-06-17 ENCOUNTER — Encounter: Payer: Self-pay | Admitting: Internal Medicine

## 2014-06-17 DIAGNOSIS — K5901 Slow transit constipation: Secondary | ICD-10-CM | POA: Diagnosis not present

## 2014-06-17 DIAGNOSIS — M1711 Unilateral primary osteoarthritis, right knee: Secondary | ICD-10-CM

## 2014-06-17 DIAGNOSIS — I119 Hypertensive heart disease without heart failure: Secondary | ICD-10-CM | POA: Diagnosis not present

## 2014-06-17 DIAGNOSIS — D62 Acute posthemorrhagic anemia: Secondary | ICD-10-CM | POA: Diagnosis not present

## 2014-06-17 DIAGNOSIS — Z6841 Body Mass Index (BMI) 40.0 and over, adult: Secondary | ICD-10-CM

## 2014-06-17 DIAGNOSIS — E785 Hyperlipidemia, unspecified: Secondary | ICD-10-CM
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
## 2014-06-24 ENCOUNTER — Other Ambulatory Visit: Payer: Self-pay | Admitting: *Deleted

## 2014-06-24 MED ORDER — OXYCODONE HCL 5 MG PO TABS: ORAL_TABLET | ORAL | Status: DC

## 2014-06-24 NOTE — Telephone Encounter (Signed)
[facility] 

## 2014-06-28 ENCOUNTER — Non-Acute Institutional Stay (SKILLED_NURSING_FACILITY): Payer: Medicare Other | Admitting: Adult Health

## 2014-06-28 ENCOUNTER — Encounter: Payer: Self-pay | Admitting: Adult Health

## 2014-06-28 DIAGNOSIS — Z6841 Body Mass Index (BMI) 40.0 and over, adult: Secondary | ICD-10-CM

## 2014-06-28 DIAGNOSIS — Z96651 Presence of right artificial knee joint: Secondary | ICD-10-CM | POA: Diagnosis not present

## 2014-06-28 DIAGNOSIS — K5900 Constipation, unspecified: Secondary | ICD-10-CM

## 2014-06-28 DIAGNOSIS — I10 Essential (primary) hypertension: Secondary | ICD-10-CM

## 2014-06-28 DIAGNOSIS — E785 Hyperlipidemia, unspecified: Secondary | ICD-10-CM | POA: Diagnosis not present

## 2014-06-28 DIAGNOSIS — D62 Acute posthemorrhagic anemia: Secondary | ICD-10-CM

## 2014-06-28 DIAGNOSIS — M1711 Unilateral primary osteoarthritis, right knee: Secondary | ICD-10-CM | POA: Diagnosis not present
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
## 2014-07-02 DIAGNOSIS — J45998 Other asthma: Secondary | ICD-10-CM | POA: Diagnosis not present

## 2014-07-02 DIAGNOSIS — Z471 Aftercare following joint replacement surgery: Secondary | ICD-10-CM | POA: Diagnosis not present

## 2014-07-02 DIAGNOSIS — E119 Type 2 diabetes mellitus without complications: Secondary | ICD-10-CM | POA: Diagnosis not present

## 2014-07-02 DIAGNOSIS — I2510 Atherosclerotic heart disease of native coronary artery without angina pectoris: Secondary | ICD-10-CM | POA: Diagnosis not present

## 2014-07-02 DIAGNOSIS — Z96651 Presence of right artificial knee joint: Secondary | ICD-10-CM | POA: Diagnosis not present

## 2014-07-02 DIAGNOSIS — N200 Calculus of kidney: Secondary | ICD-10-CM | POA: Diagnosis not present

## 2014-07-02 DIAGNOSIS — Z7982 Long term (current) use of aspirin: Secondary | ICD-10-CM | POA: Diagnosis not present

## 2014-07-02 DIAGNOSIS — I119 Hypertensive heart disease without heart failure: Secondary | ICD-10-CM | POA: Diagnosis not present

## 2014-07-02 DIAGNOSIS — E669 Obesity, unspecified: Secondary | ICD-10-CM | POA: Diagnosis not present

## 2014-07-02 DIAGNOSIS — M810 Age-related osteoporosis without current pathological fracture: Secondary | ICD-10-CM | POA: Diagnosis not present

## 2014-07-04 DIAGNOSIS — M810 Age-related osteoporosis without current pathological fracture: Secondary | ICD-10-CM | POA: Diagnosis not present

## 2014-07-04 DIAGNOSIS — J45998 Other asthma: Secondary | ICD-10-CM | POA: Diagnosis not present

## 2014-07-04 DIAGNOSIS — Z471 Aftercare following joint replacement surgery: Secondary | ICD-10-CM | POA: Diagnosis not present

## 2014-07-04 DIAGNOSIS — N200 Calculus of kidney: Secondary | ICD-10-CM | POA: Diagnosis not present

## 2014-07-04 DIAGNOSIS — Z7982 Long term (current) use of aspirin: Secondary | ICD-10-CM | POA: Diagnosis not present

## 2014-07-04 DIAGNOSIS — Z96651 Presence of right artificial knee joint: Secondary | ICD-10-CM | POA: Diagnosis not present

## 2014-07-04 DIAGNOSIS — E669 Obesity, unspecified: Secondary | ICD-10-CM | POA: Diagnosis not present

## 2014-07-04 DIAGNOSIS — E119 Type 2 diabetes mellitus without complications: Secondary | ICD-10-CM | POA: Diagnosis not present

## 2014-07-04 DIAGNOSIS — I2510 Atherosclerotic heart disease of native coronary artery without angina pectoris: Secondary | ICD-10-CM | POA: Diagnosis not present

## 2014-07-04 DIAGNOSIS — I119 Hypertensive heart disease without heart failure: Secondary | ICD-10-CM | POA: Diagnosis not present

## 2014-07-06 DIAGNOSIS — I2510 Atherosclerotic heart disease of native coronary artery without angina pectoris: Secondary | ICD-10-CM | POA: Diagnosis not present

## 2014-07-06 DIAGNOSIS — J45998 Other asthma: Secondary | ICD-10-CM | POA: Diagnosis not present

## 2014-07-06 DIAGNOSIS — M810 Age-related osteoporosis without current pathological fracture: Secondary | ICD-10-CM | POA: Diagnosis not present

## 2014-07-06 DIAGNOSIS — Z471 Aftercare following joint replacement surgery: Secondary | ICD-10-CM | POA: Diagnosis not present

## 2014-07-06 DIAGNOSIS — E669 Obesity, unspecified: Secondary | ICD-10-CM | POA: Diagnosis not present

## 2014-07-06 DIAGNOSIS — N200 Calculus of kidney: Secondary | ICD-10-CM | POA: Diagnosis not present

## 2014-07-06 DIAGNOSIS — Z96651 Presence of right artificial knee joint: Secondary | ICD-10-CM | POA: Diagnosis not present

## 2014-07-06 DIAGNOSIS — Z7982 Long term (current) use of aspirin: Secondary | ICD-10-CM | POA: Diagnosis not present

## 2014-07-06 DIAGNOSIS — I119 Hypertensive heart disease without heart failure: Secondary | ICD-10-CM | POA: Diagnosis not present

## 2014-07-06 DIAGNOSIS — E119 Type 2 diabetes mellitus without complications: Secondary | ICD-10-CM | POA: Diagnosis not present

## 2014-07-09 DIAGNOSIS — N200 Calculus of kidney: Secondary | ICD-10-CM | POA: Diagnosis not present

## 2014-07-09 DIAGNOSIS — Z7982 Long term (current) use of aspirin: Secondary | ICD-10-CM | POA: Diagnosis not present

## 2014-07-09 DIAGNOSIS — Z96651 Presence of right artificial knee joint: Secondary | ICD-10-CM | POA: Diagnosis not present

## 2014-07-09 DIAGNOSIS — M810 Age-related osteoporosis without current pathological fracture: Secondary | ICD-10-CM | POA: Diagnosis not present

## 2014-07-09 DIAGNOSIS — Z471 Aftercare following joint replacement surgery: Secondary | ICD-10-CM | POA: Diagnosis not present

## 2014-07-09 DIAGNOSIS — I2510 Atherosclerotic heart disease of native coronary artery without angina pectoris: Secondary | ICD-10-CM | POA: Diagnosis not present

## 2014-07-09 DIAGNOSIS — E119 Type 2 diabetes mellitus without complications: Secondary | ICD-10-CM | POA: Diagnosis not present

## 2014-07-09 DIAGNOSIS — I119 Hypertensive heart disease without heart failure: Secondary | ICD-10-CM | POA: Diagnosis not present

## 2014-07-09 DIAGNOSIS — J45998 Other asthma: Secondary | ICD-10-CM | POA: Diagnosis not present

## 2014-07-09 DIAGNOSIS — E669 Obesity, unspecified: Secondary | ICD-10-CM | POA: Diagnosis not present

## 2014-07-11 ENCOUNTER — Other Ambulatory Visit: Payer: Self-pay | Admitting: Adult Health

## 2014-07-11 DIAGNOSIS — E669 Obesity, unspecified: Secondary | ICD-10-CM | POA: Diagnosis not present

## 2014-07-11 DIAGNOSIS — Z7982 Long term (current) use of aspirin: Secondary | ICD-10-CM | POA: Diagnosis not present

## 2014-07-11 DIAGNOSIS — I2510 Atherosclerotic heart disease of native coronary artery without angina pectoris: Secondary | ICD-10-CM | POA: Diagnosis not present

## 2014-07-11 DIAGNOSIS — E119 Type 2 diabetes mellitus without complications: Secondary | ICD-10-CM | POA: Diagnosis not present

## 2014-07-11 DIAGNOSIS — Z96651 Presence of right artificial knee joint: Secondary | ICD-10-CM | POA: Diagnosis not present

## 2014-07-11 DIAGNOSIS — M810 Age-related osteoporosis without current pathological fracture: Secondary | ICD-10-CM | POA: Diagnosis not present

## 2014-07-11 DIAGNOSIS — I119 Hypertensive heart disease without heart failure: Secondary | ICD-10-CM | POA: Diagnosis not present

## 2014-07-11 DIAGNOSIS — Z471 Aftercare following joint replacement surgery: Secondary | ICD-10-CM | POA: Diagnosis not present

## 2014-07-11 DIAGNOSIS — N200 Calculus of kidney: Secondary | ICD-10-CM | POA: Diagnosis not present

## 2014-07-11 DIAGNOSIS — J45998 Other asthma: Secondary | ICD-10-CM | POA: Diagnosis not present

## 2014-07-12 DIAGNOSIS — Z96651 Presence of right artificial knee joint: Secondary | ICD-10-CM | POA: Diagnosis not present

## 2014-07-12 DIAGNOSIS — E119 Type 2 diabetes mellitus without complications: Secondary | ICD-10-CM | POA: Diagnosis not present

## 2014-07-12 DIAGNOSIS — M810 Age-related osteoporosis without current pathological fracture: Secondary | ICD-10-CM | POA: Diagnosis not present

## 2014-07-12 DIAGNOSIS — J45998 Other asthma: Secondary | ICD-10-CM | POA: Diagnosis not present

## 2014-07-12 DIAGNOSIS — N200 Calculus of kidney: Secondary | ICD-10-CM | POA: Diagnosis not present

## 2014-07-12 DIAGNOSIS — I119 Hypertensive heart disease without heart failure: Secondary | ICD-10-CM | POA: Diagnosis not present

## 2014-07-12 DIAGNOSIS — I2510 Atherosclerotic heart disease of native coronary artery without angina pectoris: Secondary | ICD-10-CM | POA: Diagnosis not present

## 2014-07-12 DIAGNOSIS — Z471 Aftercare following joint replacement surgery: Secondary | ICD-10-CM | POA: Diagnosis not present

## 2014-07-12 DIAGNOSIS — E669 Obesity, unspecified: Secondary | ICD-10-CM | POA: Diagnosis not present

## 2014-07-12 DIAGNOSIS — Z7982 Long term (current) use of aspirin: Secondary | ICD-10-CM | POA: Diagnosis not present

## 2014-07-18 ENCOUNTER — Ambulatory Visit (INDEPENDENT_AMBULATORY_CARE_PROVIDER_SITE_OTHER): Payer: Medicare Other | Admitting: Family Medicine

## 2014-07-18 ENCOUNTER — Other Ambulatory Visit: Payer: Self-pay | Admitting: Adult Health

## 2014-07-18 ENCOUNTER — Encounter: Payer: Self-pay | Admitting: Family Medicine

## 2014-07-18 VITALS — BP 140/80 | Wt 241.0 lb

## 2014-07-18 DIAGNOSIS — Z96651 Presence of right artificial knee joint: Secondary | ICD-10-CM | POA: Diagnosis not present

## 2014-07-18 DIAGNOSIS — R8290 Unspecified abnormal findings in urine: Secondary | ICD-10-CM

## 2014-07-18 LAB — POCT URINALYSIS DIPSTICK
Bilirubin, UA: NEGATIVE
Glucose, UA: NEGATIVE
Ketones, UA: NEGATIVE
Leukocytes, UA: NEGATIVE
Nitrite, UA: NEGATIVE
PH UA: 7.5
PROTEIN UA: 0.15
RBC UA: NEGATIVE
SPEC GRAV UA: 1.015
UROBILINOGEN UA: 1

## 2014-07-18 NOTE — Patient Instructions (Signed)
Take a multivitamin with extra vitamin D.

## 2014-07-18 NOTE — Progress Notes (Signed)
   Subjective:    Patient ID: Sandra Lawrence, female    DOB: Oct 28, 1945, 69 y.o.   MRN: [id number]  HPI  She recently had a right TKR and was in a nursing facility for while. She notes a strong odor to her urine but no frequency or dysuria or urgency.   Review of Systems     Objective:   Physical Exam  or tendon no distress. Urine  Dipstick was negative       Assessment & Plan:  Bad odor of urine - Plan: POCT Urinalysis Dipstick  Status post total right knee replacement  curvature to drink after fluids. Discussed the need for her to rehabilitate her knee and work on scar mobilization.

## 2014-07-24 DIAGNOSIS — Z96651 Presence of right artificial knee joint: Secondary | ICD-10-CM | POA: Diagnosis not present

## 2014-07-24 DIAGNOSIS — Z471 Aftercare following joint replacement surgery: Secondary | ICD-10-CM | POA: Diagnosis not present

## 2014-07-29 ENCOUNTER — Telehealth: Payer: Self-pay | Admitting: Family Medicine

## 2014-07-29 ENCOUNTER — Other Ambulatory Visit: Payer: Self-pay

## 2014-07-29 DIAGNOSIS — I119 Hypertensive heart disease without heart failure: Secondary | ICD-10-CM

## 2014-07-29 MED ORDER — PRAVASTATIN SODIUM 40 MG PO TABS: 40.0000 mg | ORAL_TABLET | Freq: Every day | ORAL | Status: DC

## 2014-07-29 MED ORDER — METOPROLOL TARTRATE 25 MG PO TABS: 12.5000 mg | ORAL_TABLET | Freq: Two times a day (BID) | ORAL | Status: DC

## 2014-07-29 MED ORDER — LISINOPRIL 40 MG PO TABS: 40.0000 mg | ORAL_TABLET | Freq: Every day | ORAL | Status: DC

## 2014-07-29 MED ORDER — HYDROCHLOROTHIAZIDE 12.5 MG PO CAPS: ORAL_CAPSULE | ORAL | Status: DC

## 2014-07-29 NOTE — Telephone Encounter (Signed)
Pt made med ck appt for 08/06/14. Requesting refill on Atoravastatin 10mg , Lisinopril 40mg , Hydrochlorothiazide 12.5MG , Metroprolol 25mg 

## 2014-07-29 NOTE — Telephone Encounter (Signed)
done

## 2014-08-06 ENCOUNTER — Encounter: Payer: Medicare Other | Admitting: Family Medicine

## 2014-08-13 ENCOUNTER — Ambulatory Visit (INDEPENDENT_AMBULATORY_CARE_PROVIDER_SITE_OTHER): Payer: Medicare Other | Admitting: Family Medicine

## 2014-08-13 ENCOUNTER — Encounter: Payer: Self-pay | Admitting: Family Medicine

## 2014-08-13 VITALS — BP 140/84 | HR 71 | Wt 241.0 lb

## 2014-08-13 DIAGNOSIS — I2510 Atherosclerotic heart disease of native coronary artery without angina pectoris: Secondary | ICD-10-CM | POA: Diagnosis not present

## 2014-08-13 DIAGNOSIS — I119 Hypertensive heart disease without heart failure: Secondary | ICD-10-CM

## 2014-08-13 DIAGNOSIS — M1990 Unspecified osteoarthritis, unspecified site: Secondary | ICD-10-CM

## 2014-08-13 DIAGNOSIS — K5909 Other constipation: Secondary | ICD-10-CM

## 2014-08-13 DIAGNOSIS — Z96651 Presence of right artificial knee joint: Secondary | ICD-10-CM | POA: Diagnosis not present

## 2014-08-13 DIAGNOSIS — Z6841 Body Mass Index (BMI) 40.0 and over, adult: Secondary | ICD-10-CM

## 2014-08-13 DIAGNOSIS — E119 Type 2 diabetes mellitus without complications: Secondary | ICD-10-CM | POA: Diagnosis not present

## 2014-08-13 MED ORDER — NITROGLYCERIN 0.4 MG SL SUBL: 0.4000 mg | SUBLINGUAL_TABLET | SUBLINGUAL | Status: DC | PRN

## 2014-08-13 NOTE — Progress Notes (Signed)
   Subjective:    Patient ID: Sandra Lawrence, female    DOB: 1945/08/23, 69 y.o.   MRN: [id number]  HPI She is here for an interval evaluation. She recently had right total knee replacement. She is still in therapy with this. She continues to use codeine for pain relief.She did recently fall and did have pain in the right hip area. She has a previous history of arthritis of that hip. She also complains of lower abdominal pain but relates this to constipation. Presently she is using MiraLAX but not on a regular basis. She states she has 3 or 4 stools per week. She has a BM, the pain does diminish. She recently had a hemoglobin A1c done at another facility which was 6.5. She has a previous history of coronary artery disease and would like a refill on her Nitrostat. She has had no difficulty with chest pain, shortness of breath, PND or DOE.   Review of Systems     Objective:   Physical Exam Alert and in no distress. Cardiac exam shows regular rhythm without murmurs or gallops. Lungs are clear to auscultation. Abdominal exam shows decreased bowel sounds without masses or tenderness. Motion of the right hip did cause some discomfort. Surgical scar noted on the right knee.       Assessment & Plan:  S/P right TKA  Status post total right knee replacement  Diabetes mellitus type 2, noninsulin dependent  Arthritis  Coronary artery disease involving native coronary artery of native heart without angina pectoris - Plan: nitroGLYCERIN (NITROSTAT) 0.4 MG SL tablet  Other constipation  Morbid obesity with BMI of 40.0-44.9, adult  Hypertensive heart disease without CHF recommend increasing her physical activity. Recommend she use MiraLAX on a daily basis. Also encouraged her to use Tylenol or an NSAID instead of codeine for her pain. Follow-up here in 4 months.

## 2014-08-13 NOTE — Patient Instructions (Addendum)
Use the MiraLAX daily. Use Tylenol for your abdominal pain not codeine. Use Tylenol first for pain relief. If you need something else and then use either Advil or Aleve

## 2014-08-15 ENCOUNTER — Telehealth: Payer: Self-pay | Admitting: Internal Medicine

## 2014-08-15 MED ORDER — ORLISTAT 120 MG PO CAPS: 120.0000 mg | ORAL_CAPSULE | Freq: Three times a day (TID) | ORAL | Status: DC

## 2014-08-15 NOTE — Telephone Encounter (Signed)
Pt needs a refill on orlistat 120mg  to [facility]

## 2014-08-26 ENCOUNTER — Encounter: Payer: Self-pay | Admitting: Internal Medicine

## 2014-09-03 ENCOUNTER — Telehealth: Payer: Self-pay | Admitting: Family Medicine

## 2014-09-03 NOTE — Telephone Encounter (Signed)
Initiated P.A. Xenical

## 2014-09-12 NOTE — Telephone Encounter (Signed)
Faxed letter of appeal

## 2014-09-12 NOTE — Telephone Encounter (Signed)
P.A. Denied Xenical, Medicare Part D does not cover any meds for weight loss. [place] and no patience assistance available for Xenical.  Pt informed

## 2014-09-17 NOTE — Telephone Encounter (Signed)
Appeal for Xenical approved til 05/17/15, pt called that she received letter

## 2014-09-18 DIAGNOSIS — M7061 Trochanteric bursitis, right hip: Secondary | ICD-10-CM | POA: Diagnosis not present

## 2014-09-18 DIAGNOSIS — M25551 Pain in right hip: Secondary | ICD-10-CM | POA: Diagnosis not present

## 2014-09-18 DIAGNOSIS — Z96651 Presence of right artificial knee joint: Secondary | ICD-10-CM | POA: Diagnosis not present

## 2014-09-23 ENCOUNTER — Other Ambulatory Visit: Payer: Self-pay

## 2014-09-23 DIAGNOSIS — Z1231 Encounter for screening mammogram for malignant neoplasm of breast: Secondary | ICD-10-CM

## 2014-10-01 ENCOUNTER — Encounter: Payer: Self-pay | Admitting: *Deleted

## 2014-10-23 ENCOUNTER — Ambulatory Visit: Payer: Medicare Other | Admitting: Internal Medicine

## 2014-10-24 ENCOUNTER — Ambulatory Visit
Admission: RE | Admit: 2014-10-24 | Discharge: 2014-10-24 | Disposition: A | Discharge status: Home / Self Care | Payer: Medicare Other | Source: Ambulatory Visit

## 2014-10-24 DIAGNOSIS — Z1231 Encounter for screening mammogram for malignant neoplasm of breast: Secondary | ICD-10-CM

## 2014-10-26 ENCOUNTER — Other Ambulatory Visit: Payer: Self-pay | Admitting: Family Medicine

## 2014-10-29 ENCOUNTER — Other Ambulatory Visit: Payer: Self-pay | Admitting: Family Medicine

## 2014-10-29 DIAGNOSIS — R928 Other abnormal and inconclusive findings on diagnostic imaging of breast: Secondary | ICD-10-CM

## 2014-10-31 ENCOUNTER — Ambulatory Visit
Admission: RE | Admit: 2014-10-31 | Discharge: 2014-10-31 | Disposition: A | Discharge status: Home / Self Care | Payer: Medicare Other | Source: Ambulatory Visit | Attending: Family Medicine | Admitting: Family Medicine

## 2014-10-31 DIAGNOSIS — R928 Other abnormal and inconclusive findings on diagnostic imaging of breast: Secondary | ICD-10-CM

## 2014-10-31 DIAGNOSIS — N6001 Solitary cyst of right breast: Secondary | ICD-10-CM | POA: Diagnosis not present

## 2014-12-10 ENCOUNTER — Other Ambulatory Visit: Payer: Self-pay | Admitting: Family Medicine

## 2014-12-10 NOTE — Telephone Encounter (Signed)
Is this okay?

## 2014-12-31 ENCOUNTER — Encounter: Payer: Self-pay | Admitting: Family Medicine

## 2014-12-31 ENCOUNTER — Ambulatory Visit (INDEPENDENT_AMBULATORY_CARE_PROVIDER_SITE_OTHER): Payer: Medicare Other | Admitting: Family Medicine

## 2014-12-31 VITALS — BP 150/82 | HR 71 | Ht 67.0 in | Wt 246.4 lb

## 2014-12-31 DIAGNOSIS — Z638 Other specified problems related to primary support group: Secondary | ICD-10-CM

## 2014-12-31 DIAGNOSIS — Z6841 Body Mass Index (BMI) 40.0 and over, adult: Secondary | ICD-10-CM | POA: Diagnosis not present

## 2014-12-31 DIAGNOSIS — G4730 Sleep apnea, unspecified: Secondary | ICD-10-CM

## 2014-12-31 DIAGNOSIS — E785 Hyperlipidemia, unspecified: Secondary | ICD-10-CM

## 2014-12-31 DIAGNOSIS — I119 Hypertensive heart disease without heart failure: Secondary | ICD-10-CM | POA: Diagnosis not present

## 2014-12-31 DIAGNOSIS — G479 Sleep disorder, unspecified: Secondary | ICD-10-CM | POA: Diagnosis not present

## 2014-12-31 DIAGNOSIS — E119 Type 2 diabetes mellitus without complications: Secondary | ICD-10-CM

## 2014-12-31 DIAGNOSIS — M1990 Unspecified osteoarthritis, unspecified site: Secondary | ICD-10-CM | POA: Diagnosis not present

## 2014-12-31 DIAGNOSIS — I2510 Atherosclerotic heart disease of native coronary artery without angina pectoris: Secondary | ICD-10-CM | POA: Diagnosis not present

## 2014-12-31 DIAGNOSIS — E1169 Type 2 diabetes mellitus with other specified complication: Secondary | ICD-10-CM | POA: Diagnosis not present

## 2014-12-31 DIAGNOSIS — I2583 Coronary atherosclerosis due to lipid rich plaque: Secondary | ICD-10-CM

## 2014-12-31 DIAGNOSIS — Z96651 Presence of right artificial knee joint: Secondary | ICD-10-CM | POA: Diagnosis not present

## 2014-12-31 LAB — POCT UA - MICROALBUMIN
ALBUMIN/CREATININE RATIO, URINE, POC: 16.7
CREATININE, POC: 120.7 mg/dL
Microalbumin Ur, POC: 20.1 mg/L

## 2014-12-31 LAB — POCT GLYCOSYLATED HEMOGLOBIN (HGB A1C): HEMOGLOBIN A1C: 5.9

## 2014-12-31 MED ORDER — METOPROLOL TARTRATE 25 MG PO TABS: 25.0000 mg | ORAL_TABLET | Freq: Two times a day (BID) | ORAL | Status: DC
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
## 2014-12-31 NOTE — Patient Instructions (Addendum)
Get another appointment with Dr. Alvan Dame to follow-up on the painInsomnia Insomnia is frequent trouble falling and/or staying asleep. Insomnia can be a long term problem or a short term problem. Both are common. Insomnia can be a short term problem when the wakefulness is related to a certain stress or worry. Long term insomnia is often related to ongoing stress during waking hours and/or poor sleeping habits. Overtime, sleep deprivation itself can make the problem worse. Every little thing feels more severe because you are overtired and your ability to cope is decreased. CAUSES   Stress, anxiety, and depression.  Poor sleeping habits.  Distractions such as TV in the bedroom.  Naps close to bedtime.  Engaging in emotionally charged conversations before bed.  Technical reading before sleep.  Alcohol and other sedatives. They may make the problem worse. They can hurt normal sleep patterns and normal dream activity.  Stimulants such as caffeine for several hours prior to bedtime.  Pain syndromes and shortness of breath can cause insomnia.  Exercise late at night.  Changing time zones may cause sleeping problems (jet lag). It is sometimes helpful to have someone observe your sleeping patterns. They should look for periods of not breathing during the night (sleep apnea). They should also look to see how long those periods last. If you live alone or observers are uncertain, you can also be observed at a sleep clinic where your sleep patterns will be professionally monitored. Sleep apnea requires a checkup and treatment. Give your caregivers your medical history. Give your caregivers observations your family has made about your sleep.  SYMPTOMS   Not feeling rested in the morning.  Anxiety and restlessness at bedtime.  Difficulty falling and staying asleep. TREATMENT   Your caregiver may prescribe treatment for an underlying medical disorders. Your caregiver can give advice or help if you  are using alcohol or other drugs for self-medication. Treatment of underlying problems will usually eliminate insomnia problems.  Medications can be prescribed for short time use. They are generally not recommended for lengthy use.  Over-the-counter sleep medicines are not recommended for lengthy use. They can be habit forming.  You can promote easier sleeping by making lifestyle changes such as:  Using relaxation techniques that help with breathing and reduce muscle tension.  Exercising earlier in the day.  Changing your diet and the time of your last meal. No night time snacks.  Establish a regular time to go to bed.  Counseling can help with stressful problems and worry.  Soothing music and white noise may be helpful if there are background noises you cannot remove.  Stop tedious detailed work at least one hour before bedtime. HOME CARE INSTRUCTIONS   Keep a diary. Inform your caregiver about your progress. This includes any medication side effects. See your caregiver regularly. Take note of:  Times when you are asleep.  Times when you are awake during the night.  The quality of your sleep.  How you feel the next day. This information will help your caregiver care for you.  Get out of bed if you are still awake after 15 minutes. Read or do some quiet activity. Keep the lights down. Wait until you feel sleepy and go back to bed.  Keep regular sleeping and waking hours. Avoid naps.  Exercise regularly.  Avoid distractions at bedtime. Distractions include watching television or engaging in any intense or detailed activity like attempting to balance the household checkbook.  Develop a bedtime ritual. Keep a familiar routine of  bathing, brushing your teeth, climbing into bed at the same time each night, listening to soothing music. Routines increase the success of falling to sleep faster.  Use relaxation techniques. This can be using breathing and muscle tension release  routines. It can also include visualizing peaceful scenes. You can also help control troubling or intruding thoughts by keeping your mind occupied with boring or repetitive thoughts like the old concept of counting sheep. You can make it more creative like imagining planting one beautiful flower after another in your backyard garden.  During your day, work to eliminate stress. When this is not possible use some of the previous suggestions to help reduce the anxiety that accompanies stressful situations. MAKE SURE YOU:   Understand these instructions.  Will watch your condition.  Will get help right away if you are not doing well or get worse. Document Released: 04/30/2000 Document Revised: 07/26/2011 Document Reviewed: 05/31/2007 [facility] Patient Information 2015 [place], [place]. This information is not intended to replace advice given to you by your health care provider. Make sure you discuss any questions you have with your health care provider.

## 2015-01-01 ENCOUNTER — Encounter (HOSPITAL_COMMUNITY): Payer: Self-pay | Admitting: *Deleted

## 2015-01-01 ENCOUNTER — Emergency Department (HOSPITAL_COMMUNITY)
Admission: EM | Admit: 2015-01-01 | Discharge: 2015-01-01 | Disposition: A | Discharge status: Home / Self Care | Payer: Medicare Other | Attending: Emergency Medicine | Admitting: Emergency Medicine

## 2015-01-01 DIAGNOSIS — Z79899 Other long term (current) drug therapy: Secondary | ICD-10-CM | POA: Insufficient documentation

## 2015-01-01 DIAGNOSIS — N189 Chronic kidney disease, unspecified: Secondary | ICD-10-CM | POA: Diagnosis not present

## 2015-01-01 DIAGNOSIS — E119 Type 2 diabetes mellitus without complications: Secondary | ICD-10-CM | POA: Insufficient documentation

## 2015-01-01 DIAGNOSIS — Z87891 Personal history of nicotine dependence: Secondary | ICD-10-CM | POA: Insufficient documentation

## 2015-01-01 DIAGNOSIS — I2510 Atherosclerotic heart disease of native coronary artery without angina pectoris: Secondary | ICD-10-CM | POA: Diagnosis not present

## 2015-01-01 DIAGNOSIS — E669 Obesity, unspecified: Secondary | ICD-10-CM | POA: Diagnosis not present

## 2015-01-01 DIAGNOSIS — G4730 Sleep apnea, unspecified: Secondary | ICD-10-CM | POA: Diagnosis not present

## 2015-01-01 DIAGNOSIS — M549 Dorsalgia, unspecified: Secondary | ICD-10-CM | POA: Diagnosis present

## 2015-01-01 DIAGNOSIS — Z8659 Personal history of other mental and behavioral disorders: Secondary | ICD-10-CM | POA: Diagnosis not present

## 2015-01-01 DIAGNOSIS — Z86018 Personal history of other benign neoplasm: Secondary | ICD-10-CM | POA: Diagnosis not present

## 2015-01-01 DIAGNOSIS — E785 Hyperlipidemia, unspecified: Secondary | ICD-10-CM | POA: Diagnosis not present

## 2015-01-01 DIAGNOSIS — I1310 Hypertensive heart and chronic kidney disease without heart failure, with stage 1 through stage 4 chronic kidney disease, or unspecified chronic kidney disease: Secondary | ICD-10-CM | POA: Diagnosis not present

## 2015-01-01 DIAGNOSIS — Z87442 Personal history of urinary calculi: Secondary | ICD-10-CM | POA: Diagnosis not present

## 2015-01-01 DIAGNOSIS — J45909 Unspecified asthma, uncomplicated: Secondary | ICD-10-CM | POA: Diagnosis not present

## 2015-01-01 DIAGNOSIS — E780 Pure hypercholesterolemia: Secondary | ICD-10-CM | POA: Diagnosis not present

## 2015-01-01 DIAGNOSIS — R011 Cardiac murmur, unspecified: Secondary | ICD-10-CM | POA: Diagnosis not present

## 2015-01-01 DIAGNOSIS — Z9981 Dependence on supplemental oxygen: Secondary | ICD-10-CM | POA: Insufficient documentation

## 2015-01-01 DIAGNOSIS — M25551 Pain in right hip: Secondary | ICD-10-CM | POA: Diagnosis not present

## 2015-01-01 DIAGNOSIS — Z96651 Presence of right artificial knee joint: Secondary | ICD-10-CM | POA: Diagnosis not present

## 2015-01-01 DIAGNOSIS — Z972 Presence of dental prosthetic device (complete) (partial): Secondary | ICD-10-CM | POA: Insufficient documentation

## 2015-01-01 DIAGNOSIS — Z8719 Personal history of other diseases of the digestive system: Secondary | ICD-10-CM | POA: Insufficient documentation

## 2015-01-01 DIAGNOSIS — Z973 Presence of spectacles and contact lenses: Secondary | ICD-10-CM | POA: Diagnosis not present

## 2015-01-01 NOTE — ED Notes (Addendum)
Pt reports knee surgery several months ago and states that one leg is longer than the other and it is causing her lower back pain. Denies bowel or bladder changes.  Pt states that she is also under a lot of stress and would like speak with someone about that. Pt denies SI/HI.

## 2015-01-01 NOTE — Discharge Instructions (Signed)
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
## 2015-02-10 ENCOUNTER — Other Ambulatory Visit: Payer: Self-pay | Admitting: Family Medicine

## 2015-02-13 IMAGING — CR DG CHEST 2V
2 series · 2 of 2 positions shown · non-contrast
Comparison: DG CHEST 2 VIEW dated 04/17/2011; DG CHEST 2 VIEW dated
05/08/2010

CLINICAL DATA: Chest pain. Tachycardia. Diabetes. coronary artery
disease.

EXAM:
CHEST  2 VIEW

[w chest pa]
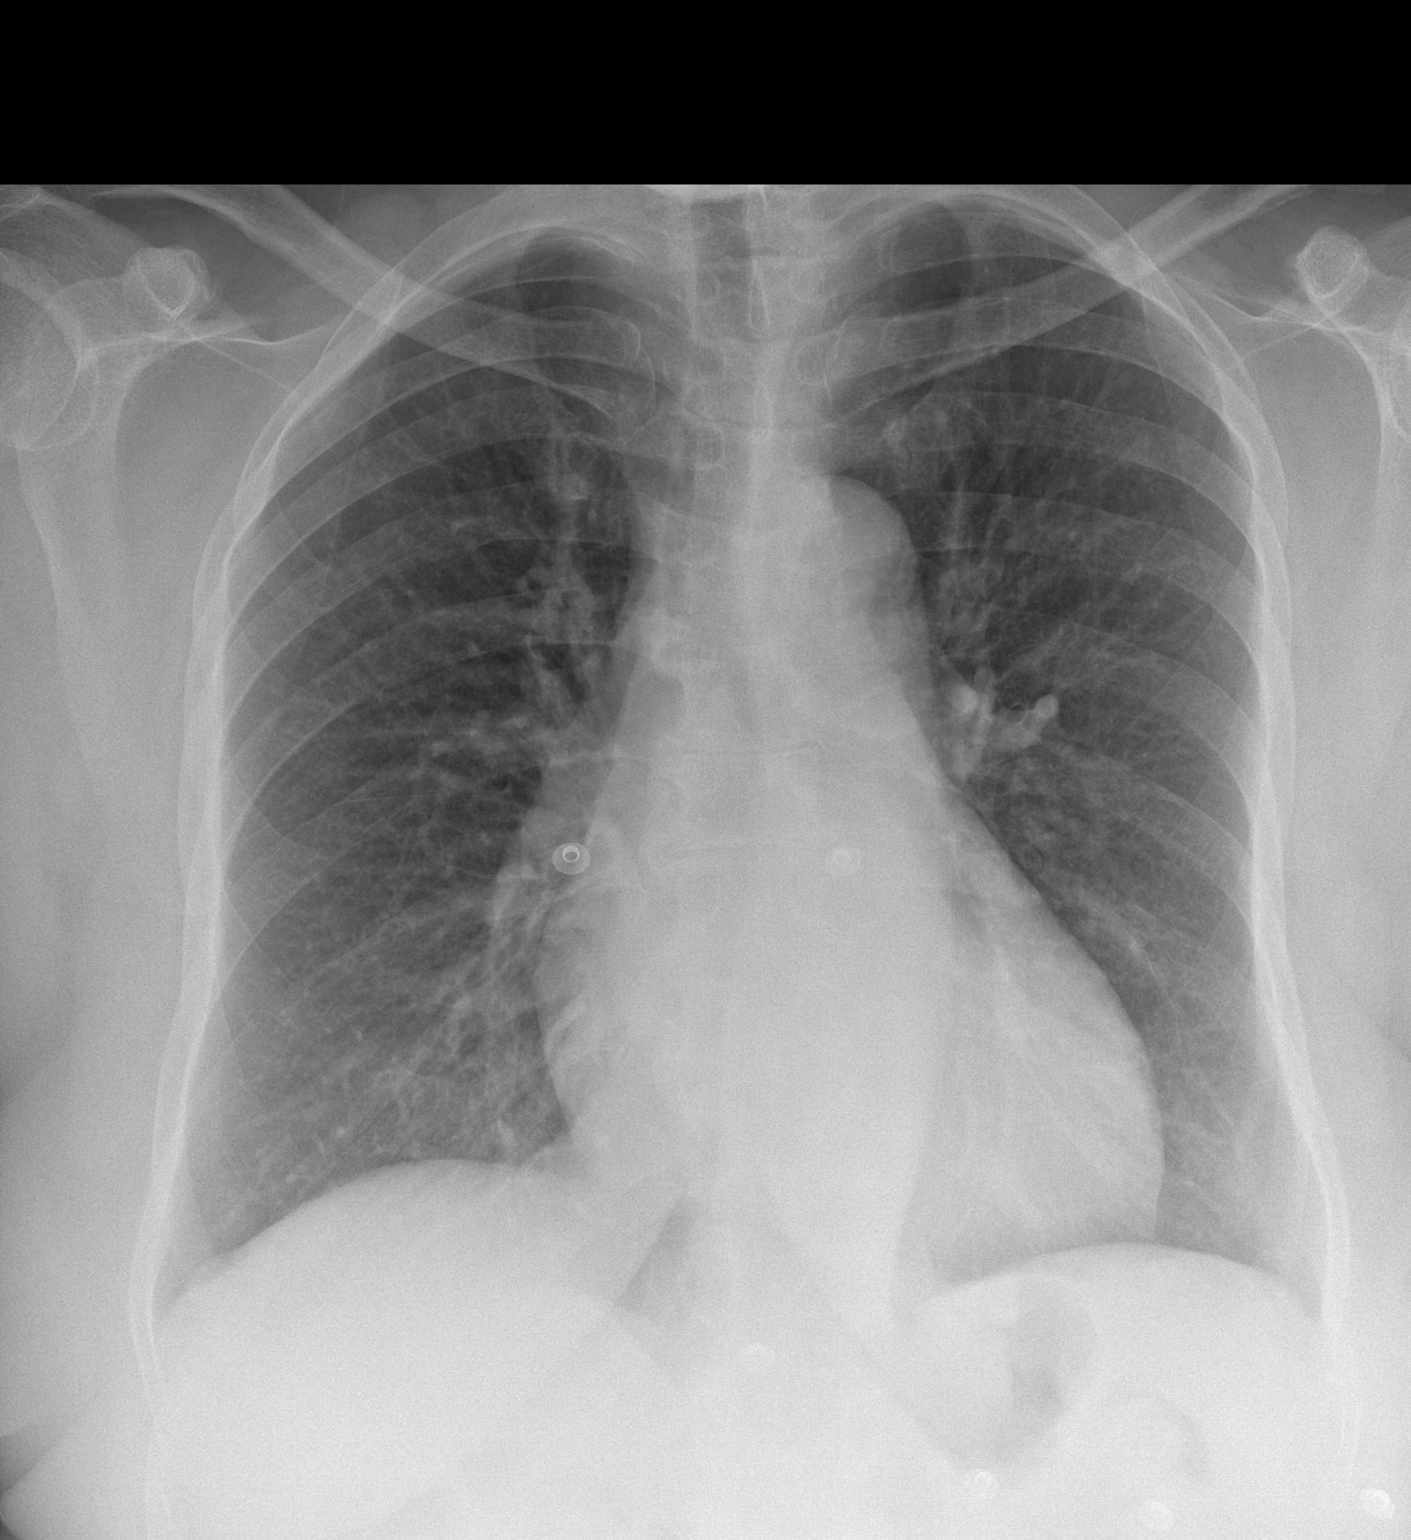

[w chest lat]
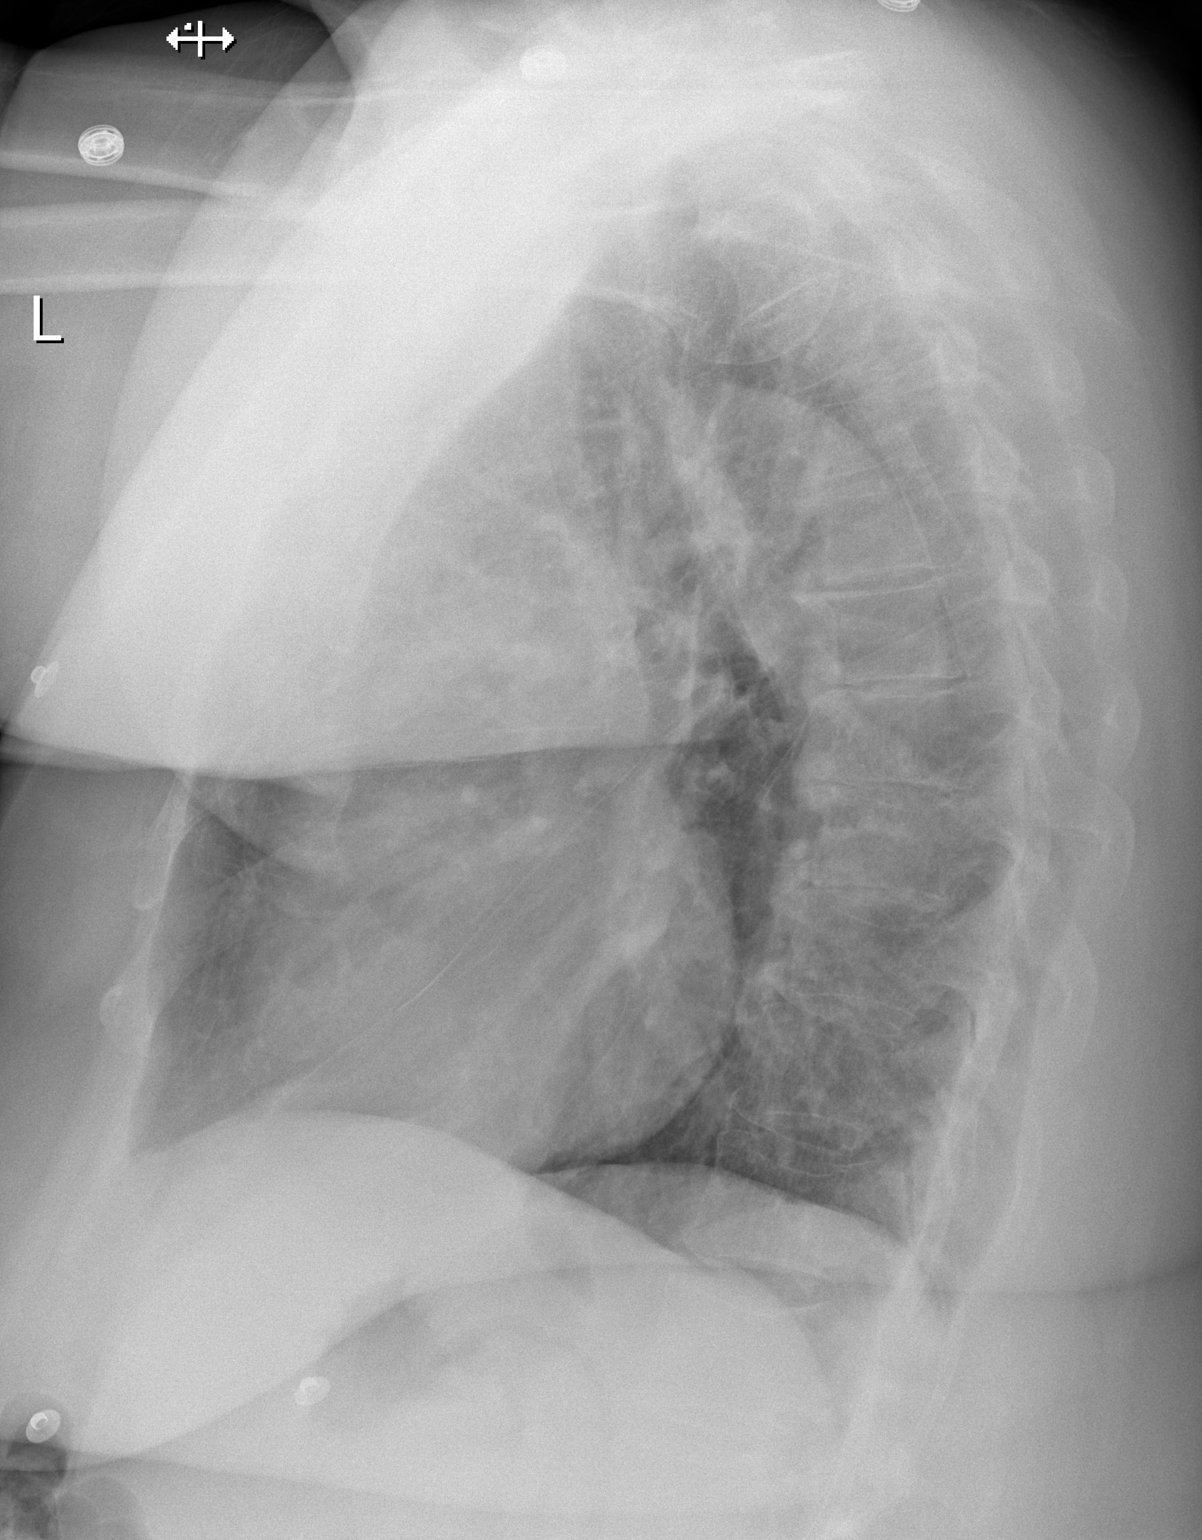

[2 of 2 positions shown; findings below may reference images not displayed]

FINDINGS: Cardiothoracic index 52%, compatible with mild cardiomegaly.
Thoracic spondylosis noted. The lungs appear clear. No edema. No
pleural effusion.
IMPRESSION: 1. Mild cardiomegaly.
2. Thoracic spondylosis.

## 2015-03-02 ENCOUNTER — Other Ambulatory Visit: Payer: Self-pay | Admitting: Family Medicine

## 2015-03-11 ENCOUNTER — Other Ambulatory Visit: Payer: Self-pay | Admitting: Family Medicine

## 2015-03-12 ENCOUNTER — Other Ambulatory Visit: Payer: Self-pay

## 2015-03-12 NOTE — Telephone Encounter (Signed)
Is this okay?

## 2015-04-16 ENCOUNTER — Encounter: Payer: Self-pay | Admitting: Internal Medicine

## 2015-04-16 ENCOUNTER — Encounter: Payer: Self-pay | Admitting: Physician Assistant

## 2015-04-16 ENCOUNTER — Encounter: Payer: Self-pay | Admitting: Gastroenterology

## 2015-05-06 ENCOUNTER — Ambulatory Visit: Payer: Self-pay | Admitting: Family Medicine

## 2015-05-06 ENCOUNTER — Ambulatory Visit: Payer: Self-pay | Admitting: Physician Assistant

## 2015-06-07 ENCOUNTER — Other Ambulatory Visit: Payer: Self-pay | Admitting: Family Medicine

## 2015-06-15 ENCOUNTER — Other Ambulatory Visit: Payer: Self-pay | Admitting: Family Medicine

## 2015-06-16 ENCOUNTER — Other Ambulatory Visit: Payer: Self-pay | Admitting: *Deleted

## 2015-06-16 MED ORDER — PRAVASTATIN SODIUM 40 MG PO TABS: ORAL_TABLET | ORAL | Status: DC

## 2015-06-18 ENCOUNTER — Other Ambulatory Visit: Payer: Self-pay | Admitting: Family Medicine

## 2015-06-19 ENCOUNTER — Other Ambulatory Visit: Payer: Self-pay | Admitting: *Deleted

## 2015-06-19 MED ORDER — ORLISTAT 120 MG PO CAPS: ORAL_CAPSULE | ORAL | Status: DC

## 2015-07-11 DIAGNOSIS — H2513 Age-related nuclear cataract, bilateral: Secondary | ICD-10-CM | POA: Diagnosis not present

## 2015-07-15 ENCOUNTER — Telehealth: Payer: Self-pay | Admitting: Family Medicine

## 2015-07-17 NOTE — Telephone Encounter (Signed)
P.A. Approved til 05/16/16, pt informed, faxed pharmacy

## 2015-07-21 ENCOUNTER — Telehealth: Payer: Self-pay | Admitting: Family Medicine

## 2015-07-21 ENCOUNTER — Encounter: Payer: Self-pay | Admitting: Family Medicine

## 2015-07-21 ENCOUNTER — Ambulatory Visit
Admission: RE | Admit: 2015-07-21 | Discharge: 2015-07-21 | Disposition: A | Discharge status: Home / Self Care | Payer: Medicare Other | Source: Ambulatory Visit | Attending: Family Medicine | Admitting: Family Medicine

## 2015-07-21 ENCOUNTER — Ambulatory Visit (INDEPENDENT_AMBULATORY_CARE_PROVIDER_SITE_OTHER): Payer: Medicare Other | Admitting: Family Medicine

## 2015-07-21 VITALS — BP 144/80 | HR 64 | Wt 264.0 lb

## 2015-07-21 DIAGNOSIS — M545 Low back pain, unspecified: Secondary | ICD-10-CM

## 2015-07-21 DIAGNOSIS — E119 Type 2 diabetes mellitus without complications: Secondary | ICD-10-CM | POA: Diagnosis not present

## 2015-07-21 DIAGNOSIS — I2583 Coronary atherosclerosis due to lipid rich plaque: Secondary | ICD-10-CM | POA: Diagnosis not present

## 2015-07-21 DIAGNOSIS — Z860100 Personal history of colon polyps, unspecified: Secondary | ICD-10-CM

## 2015-07-21 DIAGNOSIS — M1990 Unspecified osteoarthritis, unspecified site: Secondary | ICD-10-CM | POA: Diagnosis not present

## 2015-07-21 DIAGNOSIS — Z0000 Encounter for general adult medical examination without abnormal findings: Secondary | ICD-10-CM | POA: Diagnosis not present

## 2015-07-21 DIAGNOSIS — E1169 Type 2 diabetes mellitus with other specified complication: Secondary | ICD-10-CM | POA: Diagnosis not present

## 2015-07-21 DIAGNOSIS — Z23 Encounter for immunization: Secondary | ICD-10-CM | POA: Diagnosis not present

## 2015-07-21 DIAGNOSIS — Z1159 Encounter for screening for other viral diseases: Secondary | ICD-10-CM

## 2015-07-21 DIAGNOSIS — I2510 Atherosclerotic heart disease of native coronary artery without angina pectoris: Secondary | ICD-10-CM

## 2015-07-21 DIAGNOSIS — Z96651 Presence of right artificial knee joint: Secondary | ICD-10-CM | POA: Diagnosis not present

## 2015-07-21 DIAGNOSIS — E1136 Type 2 diabetes mellitus with diabetic cataract: Secondary | ICD-10-CM

## 2015-07-21 DIAGNOSIS — I119 Hypertensive heart disease without heart failure: Secondary | ICD-10-CM

## 2015-07-21 DIAGNOSIS — Z6841 Body Mass Index (BMI) 40.0 and over, adult: Secondary | ICD-10-CM | POA: Diagnosis not present

## 2015-07-21 DIAGNOSIS — M129 Arthropathy, unspecified: Secondary | ICD-10-CM | POA: Diagnosis not present

## 2015-07-21 DIAGNOSIS — E785 Hyperlipidemia, unspecified: Secondary | ICD-10-CM

## 2015-07-21 DIAGNOSIS — M5136 Other intervertebral disc degeneration, lumbar region: Secondary | ICD-10-CM | POA: Diagnosis not present

## 2015-07-21 HISTORY — DX: Type 2 diabetes mellitus with diabetic cataract: E11.36

## 2015-07-21 LAB — CBC WITH DIFFERENTIAL/PLATELET
BASOS ABS: 0 10*3/uL (ref 0.0–0.1)
Basophils Relative: 1 % (ref 0–1)
EOS ABS: 0.3 10*3/uL (ref 0.0–0.7)
EOS PCT: 6 % — AB (ref 0–5)
HCT: 40.1 % (ref 36.0–46.0)
Hemoglobin: 12.6 g/dL (ref 12.0–15.0)
LYMPHS ABS: 1.8 10*3/uL (ref 0.7–4.0)
Lymphocytes Relative: 38 % (ref 12–46)
MCH: 27.9 pg (ref 26.0–34.0)
MCHC: 31.4 g/dL (ref 30.0–36.0)
MCV: 88.9 fL (ref 78.0–100.0)
MONOS PCT: 7 % (ref 3–12)
MPV: 11.6 fL (ref 8.6–12.4)
Monocytes Absolute: 0.3 10*3/uL (ref 0.1–1.0)
Neutro Abs: 2.3 10*3/uL (ref 1.7–7.7)
Neutrophils Relative %: 48 % (ref 43–77)
PLATELETS: 247 10*3/uL (ref 150–400)
RBC: 4.51 MIL/uL (ref 3.87–5.11)
RDW: 14.5 % (ref 11.5–15.5)
WBC: 4.8 10*3/uL (ref 4.0–10.5)

## 2015-07-21 LAB — POCT GLYCOSYLATED HEMOGLOBIN (HGB A1C): HEMOGLOBIN A1C: 6.3

## 2015-07-21 NOTE — Telephone Encounter (Signed)
Take care of this 

## 2015-07-21 NOTE — Telephone Encounter (Signed)
Pt came back in after appt and stated she forgot to get a medical clearance letter. She states that she would like to participate in exercise at the [facility]. They are requesting a letter stating it is ok. Please call pt at number when ready.

## 2015-07-21 NOTE — Progress Notes (Signed)
Sandra Lawrence is a 70 y.o. female who presents for annual wellness visit and follow-up on chronic medical conditions.  She has the following concerns:she would like to get involved in an exercise program but is having difficulty with low back pain. She also has a history of right hip pain and has had this injected in the past by Dr. Alvan Lawrence. She has had bilateral TKR but seems to be doing well with this. She did have a colonoscopy in 2014 which did have a polyp. She is scheduled for repeat in 5 years. She does have a history of PVCs. She has diabetes and does not check her sugars regularly. She has no particular diet and as stated above is interested in getting involved in an exercise program. She will have her eyes checked in 2 weeks. She does have a history of bilateral cataracts and is scheduled for surgery. She intermittently checks her feet.   Immunization History  Administered Date(s) Administered  . Influenza Split 03/17/2011  . Influenza, High Dose Seasonal PF 05/07/2014  . Influenza,inj,Quad PF,36+ Mos 02/22/2013  . Tdap 03/17/2011  . Zoster 03/17/2011   Last Pap smear: Last mammogram: Last colonoscopy: Last DEXA: Dentist: No Ophtho: Dr Sandra Lawrence Exercise: None  Other doctors caring for patient include: Dr Sandra Lawrence Cataract surgery on 3/13 &  4/11   Depression screen:  See questionnaire below.  Depression screen PHQ 2/9 12/31/2014  Decreased Interest 0  Down, Depressed, Hopeless 0  PHQ - 2 Score 0    Fall Risk Screen: see questionnaire below. Fall Risk  12/31/2014  Falls in the past year? No    ADL screen:  See questionnaire below Functional Status Survey: Is the patient deaf or have difficulty hearing?: No Does the patient have difficulty seeing, even when wearing glasses/contacts?: No Does the patient have difficulty concentrating, remembering, or making decisions?: No Does the patient have difficulty walking or climbing stairs?: No Does the patient have difficulty  dressing or bathing?: No Does the patient have difficulty doing errands alone such as visiting a doctor's office or shopping?: No   End of Life Discussion:  Patient does not have a living will and medical power of attorney  Review of Systems Constitutional: -fever, -chills, -sweats, -unexpected weight change, -anorexia, -fatigue Allergy: -sneezing, -itching, -congestion Dermatology: denies changing moles, rash, lumps, new worrisome lesions ENT: -runny nose, -ear pain, -sore throat, -hoarseness, -sinus pain, -teeth pain, -tinnitus, -hearing loss, -epistaxis Cardiology:  -chest pain, -palpitations, -edema, -orthopnea, -paroxysmal nocturnal dyspnea Respiratory: -cough, -shortness of breath, -dyspnea on exertion, -wheezing, -hemoptysis Gastroenterology: -abdominal pain, -nausea, -vomiting, -diarrhea, -constipation, -blood in stool, -changes in bowel movement, -dysphagia Hematology: -bleeding or bruising problems Musculoskeletal: -arthralgias, -myalgias, -joint swelling, -back pain, -neck pain, -cramping, -gait changes Ophthalmology: -vision changes, -eye redness, -itching, -discharge Urology: -dysuria, -difficulty urinating, -hematuria, -urinary frequency, -urgency, incontinence Neurology: -headache, -weakness, -tingling, -numbness, -speech abnormality, -memory loss, -falls, -dizziness Psychology:  -depressed mood, -agitation, -sleep problems    PHYSICAL EXAM:  BP 144/80 mmHg  Pulse 64  Wt 264 lb (119.75 kg)  SpO2 99%  General Appearance: Alert, cooperative, no distress, appears stated age Head: Normocephalic, without obvious abnormality, atraumatic Eyes: PERRL, conjunctiva/corneas clear, EOM's intact, fundi benign Ears: Normal TM's and external ear canals Nose: Nares normal, mucosa normal, no drainage or sinus   tenderness Throat: Lips, mucosa, and tongue normal; teeth and gums normal Neck: Supple, no lymphadenopathy; thyroid: no enlargement/tenderness/nodules; no carotid bruit or  JVD Back: Spine tenderover sacral area, no curvature, ROM  normal, no CVA tenderness Lungs: Clear to auscultation bilaterally without wheezes, rales or ronchi; respirations unlabored Chest Wall: No tenderness or deformity Heart: Regular rate and rhythm, S1 and S2 normal, no murmur, rub or gallop Abdomen: Soft, non-tender, nondistended, normoactive bowel sounds, no masses, no hepatosplenomegaly Extremities: No clubbing, cyanosis or edema Pulses: 2+ and symmetric all extremities Skin: Skin color, texture, turgor normal, no rashes or lesions Lymph nodes: Cervical, supraclavicular, and axillary nodes normal Neurologic: CNII-XII intact, normal strength, sensation and gait; reflexes 2+ and symmetric throughout Psych: Normal mood, affect, hygiene and grooming.  ASSESSMENT/PLAN: Routine general medical examination at a health care facility  Status post total right knee replacement  Cataract associated with type 2 diabetes mellitus ([place])  Hyperlipidemia associated with type 2 diabetes mellitus ([place]) - Plan: Lipid panel  Diabetes mellitus type 2, noninsulin dependent ([place]) - Plan: POCT glycosylated hemoglobin (Hb A1C), CBC with Differential/Platelet, Comprehensive metabolic panel  Morbid obesity with BMI of 40.0-44.9, adult ([place]) - Plan: CBC with Differential/Platelet, Comprehensive metabolic panel, Lipid panel  Arthritis - Plan: CBC with Differential/Platelet, Comprehensive metabolic panel, Lipid panel  Coronary artery disease due to lipid rich plaque - Plan: CBC with Differential/Platelet, Comprehensive metabolic panel, Lipid panel  Hypertensive heart disease without CHF - Plan: CBC with Differential/Platelet, Comprehensive metabolic panel  Right-sided low back pain without sciatica - Plan: DG Lumbar Spine Complete  Need for hepatitis C screening test - Plan: Hepatitis C antibody  Need for prophylactic vaccination against Streptococcus pneumoniae (pneumococcus) - Plan: Pneumococcal  conjugate vaccine 13-valent  History of colonic polyps     Discussed monthly self breast exams and yearly mammograms; at least 30 minutes of aerobic activity at least 5 days/week and weight-bearing exercise 2x/week; proper sunscreen use reviewed; healthy diet, including goals of calcium and vitamin D intake and alcohol recommendations (less than or equal to 1 drink/day) reviewed; regular seatbelt use; changing batteries in smoke detectors.  Immunization recommendations discussed.  Colonoscopy recommendations reviewed   Medicare Attestation I have personally reviewed: The patient's medical and social history Their use of alcohol, tobacco or illicit drugs Their current medications and supplements The patient's functional ability including ADLs,fall risks, home safety risks, cognitive, and hearing and visual impairment Diet and physical activities Evidence for depression or mood disorders  The patient's weight, height, and BMI have been recorded in the chart.  I have made referrals, counseling, and provided education to the patient based on review of the above and I have provided the patient with a written personalized care plan for preventive services.   I will sign paperwork for her to start into an exercise program.  Wyatt Haste, MD   07/21/2015

## 2015-07-21 NOTE — Telephone Encounter (Signed)
Letter typed & pt informed ready for pick up

## 2015-07-22 LAB — COMPREHENSIVE METABOLIC PANEL
ALT: 15 U/L (ref 6–29)
AST: 14 U/L (ref 10–35)
Albumin: 3.6 g/dL (ref 3.6–5.1)
Alkaline Phosphatase: 106 U/L (ref 33–130)
BUN: 11 mg/dL (ref 7–25)
CHLORIDE: 104 mmol/L (ref 98–110)
CO2: 29 mmol/L (ref 20–31)
Calcium: 9.1 mg/dL (ref 8.6–10.4)
Creat: 0.55 mg/dL (ref 0.50–0.99)
GLUCOSE: 103 mg/dL — AB (ref 65–99)
POTASSIUM: 4.1 mmol/L (ref 3.5–5.3)
Sodium: 142 mmol/L (ref 135–146)
Total Bilirubin: 0.5 mg/dL (ref 0.2–1.2)
Total Protein: 6.8 g/dL (ref 6.1–8.1)

## 2015-07-22 LAB — LIPID PANEL
CHOL/HDL RATIO: 3.3 ratio (ref <=5.0)
Cholesterol: 148 mg/dL (ref 125–200)
HDL: 45 mg/dL — ABNORMAL LOW (ref >=46)
LDL Cholesterol: 87 mg/dL (ref <130)
Triglycerides: 81 mg/dL (ref <150)
VLDL: 16 mg/dL (ref <30)

## 2015-07-22 LAB — HEPATITIS C ANTIBODY: HCV AB: NEGATIVE

## 2015-08-01 ENCOUNTER — Telehealth: Payer: Self-pay | Admitting: Family Medicine

## 2015-08-01 NOTE — Telephone Encounter (Signed)
Pt called to let Dr Redmond School know that he will be getting a form from [facility] to approve a back brace. Pt said Dr Redmond School is aware of her back issues and he sent her for a back scan/image this week that showed issues.Marland Kitchen

## 2015-08-06 ENCOUNTER — Telehealth: Payer: Self-pay | Admitting: Family Medicine

## 2015-08-06 MED ORDER — LIDOCAINE (ANORECTAL) 5 % EX GEL: 1.0000 "application " | Freq: Four times a day (QID) | CUTANEOUS | Status: DC

## 2015-08-06 NOTE — Telephone Encounter (Signed)
Sandra Lawrence req Lidocaine 5% Ointment 90 day supp apply 2 grams up to 4 times daily to affected area.

## 2015-08-06 NOTE — Telephone Encounter (Signed)
Sandra Lawrence to renew

## 2015-08-12 ENCOUNTER — Telehealth: Payer: Self-pay | Admitting: Family Medicine

## 2015-08-12 MED ORDER — LIDOCAINE 5 % EX OINT: TOPICAL_OINTMENT | CUTANEOUS | Status: DC

## 2015-08-12 NOTE — Telephone Encounter (Signed)
Pharmacist called for clarification on prescription, was put in as rectal but is to be applied to body, changed that and updated Rx

## 2015-08-13 ENCOUNTER — Telehealth: Payer: Self-pay | Admitting: Family Medicine

## 2015-08-13 ENCOUNTER — Telehealth: Payer: Self-pay

## 2015-08-13 MED ORDER — LIDOCAINE 5 % EX OINT: TOPICAL_OINTMENT | CUTANEOUS | Status: DC

## 2015-08-13 NOTE — Telephone Encounter (Signed)
[facility] [place] called questioning the quantity of Lidocaine that was sent in because pharmacist said quantity sent is an unusually large amount

## 2015-08-13 NOTE — Telephone Encounter (Signed)
pts rx was sent to wrong pharmacy and i sent to the right one

## 2015-08-14 NOTE — Telephone Encounter (Signed)
Sandra Lawrence is working on med with Psychologist[facility].  ([facility] said normally 100 gram tubes with max of 4)fyi

## 2015-08-14 NOTE — Telephone Encounter (Signed)
Work with the pharmacist on the correct dosing.

## 2015-09-22 ENCOUNTER — Other Ambulatory Visit: Payer: Self-pay | Admitting: Family Medicine

## 2015-10-31 ENCOUNTER — Telehealth: Payer: Self-pay

## 2015-10-31 NOTE — Telephone Encounter (Signed)
Left message for pt to call me back to see when she had her last dexa scan if she had had at [facility] or not so we could get this set up

## 2015-11-03 ENCOUNTER — Ambulatory Visit (INDEPENDENT_AMBULATORY_CARE_PROVIDER_SITE_OTHER): Payer: Medicare Other | Admitting: Family Medicine

## 2015-11-03 VITALS — BP 140/80 | HR 66 | Wt 271.8 lb

## 2015-11-03 DIAGNOSIS — M8580 Other specified disorders of bone density and structure, unspecified site: Secondary | ICD-10-CM

## 2015-11-03 DIAGNOSIS — J3489 Other specified disorders of nose and nasal sinuses: Secondary | ICD-10-CM

## 2015-11-03 DIAGNOSIS — R05 Cough: Secondary | ICD-10-CM | POA: Diagnosis not present

## 2015-11-03 DIAGNOSIS — R059 Cough, unspecified: Secondary | ICD-10-CM

## 2015-11-03 NOTE — Progress Notes (Signed)
   Subjective:    Patient ID: Sandra Lawrence, female    DOB: 06/15/1945, 70 y.o.   MRN: [id number]  HPI She complains of a 3 month history of dry cough but no fever, chills, sore throat, earache, congestion or shortness of breath. She continues on lisinopril and has been on that for several years. She has a previous history of osteopenia but has not had a follow-up DEXA scan. She also has a lesion present on her nose.   Review of Systems     Objective:   Physical Exam Alert and in no distress. Tympanic membranes and canals are normal. Pharyngeal area is normal. Neck is supple without adenopathy or thyromegaly. Cardiac exam shows a regular sinus rhythm without murmurs or gallops. Lungs are clear to auscultation. Round raised flesh-colored lesion approximately half a centimeter in size is noted on the right side of the tip of the nose        Assessment & Plan:  Nasal lesion - Plan: Ambulatory referral to Dermatology  Cough  Osteopenia - Plan: DG Bone Density Recommend she stop her lisinopril for approximately one week and let me know how it is doing. Also wrote a prescription for her to go to the drugstore to get a shingles vaccine. She will be referred to dermatology for evaluation and treatment of the nasal lesion.

## 2015-11-03 NOTE — Patient Instructions (Signed)
Hold taking the lisinopril for the rest of the week and call me on Monday and let me know how the cough is

## 2015-11-05 ENCOUNTER — Encounter (HOSPITAL_COMMUNITY): Payer: Self-pay

## 2015-11-05 ENCOUNTER — Emergency Department (HOSPITAL_COMMUNITY)
Admission: EM | Admit: 2015-11-05 | Discharge: 2015-11-05 | Disposition: A | Discharge status: Home / Self Care | Payer: Medicare Other | Attending: Emergency Medicine | Admitting: Emergency Medicine

## 2015-11-05 DIAGNOSIS — I2510 Atherosclerotic heart disease of native coronary artery without angina pectoris: Secondary | ICD-10-CM | POA: Insufficient documentation

## 2015-11-05 DIAGNOSIS — R1013 Epigastric pain: Secondary | ICD-10-CM | POA: Diagnosis present

## 2015-11-05 DIAGNOSIS — J45909 Unspecified asthma, uncomplicated: Secondary | ICD-10-CM | POA: Diagnosis not present

## 2015-11-05 DIAGNOSIS — Z87891 Personal history of nicotine dependence: Secondary | ICD-10-CM | POA: Diagnosis not present

## 2015-11-05 DIAGNOSIS — R112 Nausea with vomiting, unspecified: Secondary | ICD-10-CM | POA: Diagnosis not present

## 2015-11-05 DIAGNOSIS — N189 Chronic kidney disease, unspecified: Secondary | ICD-10-CM | POA: Insufficient documentation

## 2015-11-05 DIAGNOSIS — R509 Fever, unspecified: Secondary | ICD-10-CM | POA: Diagnosis not present

## 2015-11-05 DIAGNOSIS — I1310 Hypertensive heart and chronic kidney disease without heart failure, with stage 1 through stage 4 chronic kidney disease, or unspecified chronic kidney disease: Secondary | ICD-10-CM | POA: Insufficient documentation

## 2015-11-05 DIAGNOSIS — R197 Diarrhea, unspecified: Secondary | ICD-10-CM | POA: Diagnosis not present

## 2015-11-05 DIAGNOSIS — E1122 Type 2 diabetes mellitus with diabetic chronic kidney disease: Secondary | ICD-10-CM | POA: Diagnosis not present

## 2015-11-05 DIAGNOSIS — Z96653 Presence of artificial knee joint, bilateral: Secondary | ICD-10-CM | POA: Insufficient documentation

## 2015-11-05 LAB — COMPREHENSIVE METABOLIC PANEL
ALT: 19 U/L (ref 14–54)
ANION GAP: 8 (ref 5–15)
AST: 20 U/L (ref 15–41)
Albumin: 3.6 g/dL (ref 3.5–5.0)
Alkaline Phosphatase: 109 U/L (ref 38–126)
BUN: 9 mg/dL (ref 6–20)
CHLORIDE: 104 mmol/L (ref 101–111)
CO2: 25 mmol/L (ref 22–32)
CREATININE: 0.56 mg/dL (ref 0.44–1.00)
Calcium: 9.2 mg/dL (ref 8.9–10.3)
Glucose, Bld: 134 mg/dL — ABNORMAL HIGH (ref 65–99)
POTASSIUM: 3.7 mmol/L (ref 3.5–5.1)
SODIUM: 137 mmol/L (ref 135–145)
Total Bilirubin: 0.9 mg/dL (ref 0.3–1.2)
Total Protein: 7.8 g/dL (ref 6.5–8.1)

## 2015-11-05 LAB — CBC
HEMATOCRIT: 42.9 % (ref 36.0–46.0)
HEMOGLOBIN: 13.9 g/dL (ref 12.0–15.0)
MCH: 27.9 pg (ref 26.0–34.0)
MCHC: 32.4 g/dL (ref 30.0–36.0)
MCV: 86 fL (ref 78.0–100.0)
PLATELETS: 245 10*3/uL (ref 150–400)
RBC: 4.99 MIL/uL (ref 3.87–5.11)
RDW: 14.5 % (ref 11.5–15.5)
WBC: 5.8 10*3/uL (ref 4.0–10.5)

## 2015-11-05 LAB — POC OCCULT BLOOD, ED: Fecal Occult Bld: NEGATIVE

## 2015-11-05 LAB — URINALYSIS, ROUTINE W REFLEX MICROSCOPIC
BILIRUBIN URINE: NEGATIVE
Glucose, UA: NEGATIVE mg/dL
Ketones, ur: NEGATIVE mg/dL
LEUKOCYTES UA: NEGATIVE
Nitrite: NEGATIVE
PH: 6.5 (ref 5.0–8.0)
Protein, ur: NEGATIVE mg/dL
SPECIFIC GRAVITY, URINE: 1.023 (ref 1.005–1.030)

## 2015-11-05 LAB — URINE MICROSCOPIC-ADD ON

## 2015-11-05 LAB — LIPASE, BLOOD: LIPASE: 17 U/L (ref 11–51)

## 2015-11-05 MED ORDER — SODIUM CHLORIDE 0.9 % IV BOLUS (SEPSIS)
1000.0000 mL | Freq: Once | INTRAVENOUS | Status: AC
  Administered 2015-11-05: 1000 mL via INTRAVENOUS

## 2015-11-05 MED ORDER — ONDANSETRON 4 MG PO TBDP
ORAL_TABLET | ORAL | Status: AC
  Filled 2015-11-05: qty 1

## 2015-11-05 MED ORDER — ONDANSETRON 4 MG PO TBDP
4.0000 mg | ORAL_TABLET | Freq: Once | ORAL | Status: AC | PRN
  Administered 2015-11-05: 4 mg via ORAL

## 2015-11-05 MED ORDER — ONDANSETRON HCL 4 MG PO TABS: 4.0000 mg | ORAL_TABLET | Freq: Four times a day (QID) | ORAL | Status: DC

## 2015-11-05 MED ORDER — DIPHENOXYLATE-ATROPINE 2.5-0.025 MG PO TABS: 1.0000 | ORAL_TABLET | Freq: Four times a day (QID) | ORAL | Status: DC | PRN

## 2015-11-05 MED ORDER — ONDANSETRON HCL 4 MG/2ML IJ SOLN
4.0000 mg | Freq: Once | INTRAMUSCULAR | Status: AC
  Administered 2015-11-05: 4 mg via INTRAVENOUS
  Filled 2015-11-05: qty 2

## 2015-11-05 MED ORDER — ACETAMINOPHEN 325 MG PO TABS
650.0000 mg | ORAL_TABLET | Freq: Once | ORAL | Status: AC
  Administered 2015-11-05: 650 mg via ORAL
  Filled 2015-11-05: qty 2
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
## 2015-11-05 NOTE — ED Notes (Signed)
Took patient to the bathroom via wheelchair to attempt to obtain an urine specimen

## 2015-11-05 NOTE — ED Notes (Signed)
Pt presents with onset of epigastric pain, nausea, vomiting and diarrhea that began early this morning.  Pt reports eating "questionable chicken" last night.

## 2015-11-05 NOTE — ED Notes (Signed)
Patient D/C instructions discussed. Verbal understanding noted and patient denies any questions at this time.

## 2015-11-05 NOTE — Discharge Instructions (Signed)
Medications: Zofran, Lomotil  Treatment: Take Zofran every 6 hours as needed for nausea and vomiting. Take Lomotil up to 4 times daily as needed for diarrhea. Drink plenty of fluids with small sips to stay hydrated. Begin a clear liquid diet as tolerated and transition to solids foods that are bland such as bananas, rice, applesauce, toast once you're feeling better.  Follow-up: Please follow-up with your primary care provider tomorrow for recheck and follow-up of today's visit. Please return to the emergency department if you develop any new or worsening symptoms.   Diarrhea Diarrhea is frequent loose and watery bowel movements. It can cause you to feel weak and dehydrated. Dehydration can cause you to become tired and thirsty, have a dry mouth, and have decreased urination that often is dark yellow. Diarrhea is a sign of another problem, most often an infection that will not last long. In most cases, diarrhea typically lasts 2-3 days. However, it can last longer if it is a sign of something more serious. It is important to treat your diarrhea as directed by your caregiver to lessen or prevent future episodes of diarrhea. CAUSES  Some common causes include:  Gastrointestinal infections caused by viruses, bacteria, or parasites.  Food poisoning or food allergies.  Certain medicines, such as antibiotics, chemotherapy, and laxatives.  Artificial sweeteners and fructose.  Digestive disorders. HOME CARE INSTRUCTIONS  Ensure adequate fluid intake (hydration): Have 1 cup (8 oz) of fluid for each diarrhea episode. Avoid fluids that contain simple sugars or sports drinks, fruit juices, whole milk products, and sodas. Your urine should be clear or pale yellow if you are drinking enough fluids. Hydrate with an oral rehydration solution that you can purchase at pharmacies, retail stores, and online. You can prepare an oral rehydration solution at home by mixing the following ingredients together:   -  tsp table salt.   tsp baking soda.   tsp salt substitute containing potassium chloride.  1  tablespoons sugar.  1 L (34 oz) of water.  Certain foods and beverages may increase the speed at which food moves through the gastrointestinal (GI) tract. These foods and beverages should be avoided and include:  Caffeinated and alcoholic beverages.  High-fiber foods, such as raw fruits and vegetables, nuts, seeds, and whole grain breads and cereals.  Foods and beverages sweetened with sugar alcohols, such as xylitol, sorbitol, and mannitol.  Some foods may be well tolerated and may help thicken stool including:  Starchy foods, such as rice, toast, pasta, low-sugar cereal, oatmeal, grits, baked potatoes, crackers, and bagels.  Bananas.  Applesauce.  Add probiotic-rich foods to help increase healthy bacteria in the GI tract, such as yogurt and fermented milk products.  Wash your hands well after each diarrhea episode.  Only take over-the-counter or prescription medicines as directed by your caregiver.  Take a warm bath to relieve any burning or pain from frequent diarrhea episodes. SEEK IMMEDIATE MEDICAL CARE IF:   You are unable to keep fluids down.  You have persistent vomiting.  You have blood in your stool, or your stools are black and tarry.  You do not urinate in 6-8 hours, or there is only a small amount of very dark urine.  You have abdominal pain that increases or localizes.  You have weakness, dizziness, confusion, or light-headedness.  You have a severe headache.  Your diarrhea gets worse or does not get better.  You have a fever or persistent symptoms for more than 2-3 days.  You have a fever  and your symptoms suddenly get worse. MAKE SURE YOU:   Understand these instructions.  Will watch your condition.  Will get help right away if you are not doing well or get worse.   This information is not intended to replace advice given to you by your health care  provider. Make sure you discuss any questions you have with your health care provider.   Document Released: 04/23/2002 Document Revised: 05/24/2014 Document Reviewed: 01/09/2012 Elsevier Interactive Patient Education 2016 Elsevier Inc.  Nausea and Vomiting Nausea is a sick feeling that often comes before throwing up (vomiting). Vomiting is a reflex where stomach contents come out of your mouth. Vomiting can cause severe loss of body fluids (dehydration). Children and elderly adults can become dehydrated quickly, especially if they also have diarrhea. Nausea and vomiting are symptoms of a condition or disease. It is important to find the cause of your symptoms. CAUSES   Direct irritation of the stomach lining. This irritation can result from increased acid production (gastroesophageal reflux disease), infection, food poisoning, taking certain medicines (such as nonsteroidal anti-inflammatory drugs), alcohol use, or tobacco use.  Signals from the brain.These signals could be caused by a headache, heat exposure, an inner ear disturbance, increased pressure in the brain from injury, infection, a tumor, or a concussion, pain, emotional stimulus, or metabolic problems.  An obstruction in the gastrointestinal tract (bowel obstruction).  Illnesses such as diabetes, hepatitis, gallbladder problems, appendicitis, kidney problems, cancer, sepsis, atypical symptoms of a heart attack, or eating disorders.  Medical treatments such as chemotherapy and radiation.  Receiving medicine that makes you sleep (general anesthetic) during surgery. DIAGNOSIS Your caregiver may ask for tests to be done if the problems do not improve after a few days. Tests may also be done if symptoms are severe or if the reason for the nausea and vomiting is not clear. Tests may include:  Urine tests.  Blood tests.  Stool tests.  Cultures (to look for evidence of infection).  X-rays or other imaging studies. Test results  can help your caregiver make decisions about treatment or the need for additional tests. TREATMENT You need to stay well hydrated. Drink frequently but in small amounts.You may wish to drink water, sports drinks, clear broth, or eat frozen ice pops or gelatin dessert to help stay hydrated.When you eat, eating slowly may help prevent nausea.There are also some antinausea medicines that may help prevent nausea. HOME CARE INSTRUCTIONS   Take all medicine as directed by your caregiver.  If you do not have an appetite, do not force yourself to eat. However, you must continue to drink fluids.  If you have an appetite, eat a normal diet unless your caregiver tells you differently.  Eat a variety of complex carbohydrates (rice, wheat, potatoes, bread), lean meats, yogurt, fruits, and vegetables.  Avoid high-fat foods because they are more difficult to digest.  Drink enough water and fluids to keep your urine clear or pale yellow.  If you are dehydrated, ask your caregiver for specific rehydration instructions. Signs of dehydration may include:  Severe thirst.  Dry lips and mouth.  Dizziness.  Dark urine.  Decreasing urine frequency and amount.  Confusion.  Rapid breathing or pulse. SEEK IMMEDIATE MEDICAL CARE IF:   You have blood or brown flecks (like coffee grounds) in your vomit.  You have black or bloody stools.  You have a severe headache or stiff neck.  You are confused.  You have severe abdominal pain.  You have chest pain or  trouble breathing.  You do not urinate at least once every 8 hours.  You develop cold or clammy skin.  You continue to vomit for longer than 24 to 48 hours.  You have a fever. MAKE SURE YOU:   Understand these instructions.  Will watch your condition.  Will get help right away if you are not doing well or get worse.   This information is not intended to replace advice given to you by your health care provider. Make sure you discuss  any questions you have with your health care provider.   Document Released: 05/03/2005 Document Revised: 07/26/2011 Document Reviewed: 09/30/2010 Elsevier Interactive Patient Education [employer].

## 2015-11-06 LAB — URINE CULTURE: Special Requests: NORMAL

## 2015-11-13 ENCOUNTER — Telehealth: Payer: Self-pay

## 2015-11-13 NOTE — Telephone Encounter (Signed)
Sandra Lawrence called this morning said since she has been off lisinopril her cough has gone away she just wanted to let you know

## 2015-11-13 NOTE — Telephone Encounter (Signed)
Have her come in so we can readjust her meds

## 2015-11-14 NOTE — Telephone Encounter (Signed)
Spoke with pt- appt made for 11/25/2015 @ 10:15. Pt states she has a wedding this coming up week and unable to come week of the fourth, she has restarted her Lisinopril since talking to Cheri because she "needed something for her BP." Victorino December

## 2015-11-18 ENCOUNTER — Other Ambulatory Visit: Payer: Self-pay | Admitting: Adult Health

## 2015-11-19 ENCOUNTER — Other Ambulatory Visit: Payer: Self-pay

## 2015-11-19 ENCOUNTER — Telehealth: Payer: Self-pay

## 2015-11-19 DIAGNOSIS — J4520 Mild intermittent asthma, uncomplicated: Secondary | ICD-10-CM

## 2015-11-19 MED ORDER — ALBUTEROL SULFATE HFA 108 (90 BASE) MCG/ACT IN AERS: 2.0000 | INHALATION_SPRAY | Freq: Four times a day (QID) | RESPIRATORY_TRACT | Status: DC | PRN

## 2015-11-19 NOTE — Telephone Encounter (Signed)
Fax request rcvd for ProAir inhaler to [facility] [address].

## 2015-11-19 NOTE — Telephone Encounter (Signed)
Sent in

## 2015-11-25 ENCOUNTER — Ambulatory Visit (INDEPENDENT_AMBULATORY_CARE_PROVIDER_SITE_OTHER): Payer: Medicare Other | Admitting: Family Medicine

## 2015-11-25 VITALS — BP 130/80 | HR 84 | Ht 67.0 in | Wt 269.0 lb

## 2015-11-25 DIAGNOSIS — R05 Cough: Secondary | ICD-10-CM

## 2015-11-25 DIAGNOSIS — R058 Other specified cough: Secondary | ICD-10-CM | POA: Insufficient documentation

## 2015-11-25 DIAGNOSIS — T464X5A Adverse effect of angiotensin-converting-enzyme inhibitors, initial encounter: Secondary | ICD-10-CM | POA: Insufficient documentation

## 2015-11-25 DIAGNOSIS — I119 Hypertensive heart disease without heart failure: Secondary | ICD-10-CM | POA: Diagnosis not present

## 2015-11-25 MED ORDER — LOSARTAN POTASSIUM-HCTZ 100-12.5 MG PO TABS: 1.0000 | ORAL_TABLET | Freq: Every day | ORAL | Status: DC

## 2015-11-25 NOTE — Progress Notes (Signed)
   Subjective:    Patient ID: Sandra Lawrence, female    DOB: 1945-11-18, 70 y.o.   MRN: [id number]  HPI He is here for follow-up on her blood pressure. She did have a questionable history of Ace cough. She stop the ACE inhibitor, cough went away and when she started back, cough reoccurred.   Review of Systems     Objective:   Physical Exam alert and in no distress otherwise not examined      Assessment & Plan:  Hypertensive heart disease without CHF - Plan: losartan-hydrochlorothiazide (HYZAAR) 100-12.5 MG tablet  ACE-inhibitor cough I think she does indeed have an Ace cough. She will stop taking the lisinopril and switch to losartan. Return here in one month for recheck.

## 2015-12-09 DIAGNOSIS — D2339 Other benign neoplasm of skin of other parts of face: Secondary | ICD-10-CM | POA: Diagnosis not present

## 2015-12-09 DIAGNOSIS — L739 Follicular disorder, unspecified: Secondary | ICD-10-CM | POA: Diagnosis not present

## 2015-12-17 ENCOUNTER — Other Ambulatory Visit: Payer: Self-pay | Admitting: Family Medicine

## 2015-12-30 ENCOUNTER — Ambulatory Visit: Payer: Medicare Other | Admitting: Family Medicine

## 2016-01-06 ENCOUNTER — Encounter: Payer: Self-pay | Admitting: Family Medicine

## 2016-01-20 ENCOUNTER — Ambulatory Visit: Payer: Medicare Other | Admitting: Family Medicine

## 2016-02-04 ENCOUNTER — Ambulatory Visit (INDEPENDENT_AMBULATORY_CARE_PROVIDER_SITE_OTHER): Payer: Medicare Other | Admitting: Medical

## 2016-02-04 ENCOUNTER — Telehealth: Payer: Self-pay | Admitting: Medical

## 2016-02-04 ENCOUNTER — Encounter: Payer: Self-pay | Admitting: Medical

## 2016-02-04 VITALS — BP 124/80 | HR 76 | Temp 98.3°F | Resp 16 | Wt 280.0 lb

## 2016-02-04 DIAGNOSIS — Z23 Encounter for immunization: Secondary | ICD-10-CM

## 2016-02-04 DIAGNOSIS — H938X1 Other specified disorders of right ear: Secondary | ICD-10-CM | POA: Diagnosis not present

## 2016-02-04 DIAGNOSIS — R05 Cough: Secondary | ICD-10-CM | POA: Diagnosis not present

## 2016-02-04 DIAGNOSIS — Z6841 Body Mass Index (BMI) 40.0 and over, adult: Secondary | ICD-10-CM

## 2016-02-04 DIAGNOSIS — M25512 Pain in left shoulder: Secondary | ICD-10-CM

## 2016-02-04 DIAGNOSIS — I119 Hypertensive heart disease without heart failure: Secondary | ICD-10-CM | POA: Diagnosis not present

## 2016-02-04 DIAGNOSIS — H9311 Tinnitus, right ear: Secondary | ICD-10-CM

## 2016-02-04 DIAGNOSIS — T464X5A Adverse effect of angiotensin-converting-enzyme inhibitors, initial encounter: Secondary | ICD-10-CM

## 2016-02-04 DIAGNOSIS — M545 Low back pain, unspecified: Secondary | ICD-10-CM

## 2016-02-04 DIAGNOSIS — M5136 Other intervertebral disc degeneration, lumbar region: Secondary | ICD-10-CM | POA: Diagnosis not present

## 2016-02-04 DIAGNOSIS — M51369 Other intervertebral disc degeneration, lumbar region without mention of lumbar back pain or lower extremity pain: Secondary | ICD-10-CM

## 2016-02-04 DIAGNOSIS — H9319 Tinnitus, unspecified ear: Secondary | ICD-10-CM

## 2016-02-04 DIAGNOSIS — I2583 Coronary atherosclerosis due to lipid rich plaque: Secondary | ICD-10-CM

## 2016-02-04 DIAGNOSIS — H938X9 Other specified disorders of ear, unspecified ear: Secondary | ICD-10-CM | POA: Insufficient documentation

## 2016-02-04 DIAGNOSIS — R058 Other specified cough: Secondary | ICD-10-CM

## 2016-02-04 DIAGNOSIS — I2510 Atherosclerotic heart disease of native coronary artery without angina pectoris: Secondary | ICD-10-CM

## 2016-02-04 HISTORY — DX: Other intervertebral disc degeneration, lumbar region: M51.36

## 2016-02-04 HISTORY — DX: Low back pain, unspecified: M54.50

## 2016-02-04 HISTORY — DX: Tinnitus, unspecified ear: H93.19

## 2016-02-04 HISTORY — DX: Other intervertebral disc degeneration, lumbar region without mention of lumbar back pain or lower extremity pain: M51.369

## 2016-02-04 MED ORDER — HYDROXYZINE HCL 10 MG PO TABS: 10.0000 mg | ORAL_TABLET | Freq: Two times a day (BID) | ORAL | 0 refills | Status: DC

## 2016-02-04 MED ORDER — LOSARTAN POTASSIUM-HCTZ 100-12.5 MG PO TABS: 1.0000 | ORAL_TABLET | Freq: Every day | ORAL | 3 refills | Status: DC

## 2016-02-04 NOTE — Telephone Encounter (Signed)
Referral faxed to [facility].  

## 2016-02-04 NOTE — Telephone Encounter (Signed)
Refer to [facility] ortho for left shoulder pain, likely rotator cuff problem, low back pain, DDD lumbar spine. She has seen [facility] ortho prior for knees.  Send copy of my notes and her back xray from 07/2015
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
## 2016-02-05 ENCOUNTER — Other Ambulatory Visit: Payer: Self-pay | Admitting: Medical

## 2016-02-05 ENCOUNTER — Telehealth: Payer: Self-pay | Admitting: Medical

## 2016-02-05 NOTE — Telephone Encounter (Signed)
Please make sure pt and pharmacy aware/ I am leaving for the day

## 2016-02-05 NOTE — Telephone Encounter (Signed)
Sorry for the confusion.   I want her to use the Hydroxyzine I sent.   This works similar to [employer].

## 2016-02-05 NOTE — Telephone Encounter (Signed)
Pt called and stated that at her appt yest she was told that something would be called in for her ear. When she got to pharmacy, she had one rx which was her regular bp meds. When looking thru her chart I saw that you mentioned giving her a trial of meclizine but when I look at meds she was given hydroxyzine. Apparently the pharmacy did not fill it because it was not there for her to pick up. Please confirm what pt is to be on and advise pt at [phone].

## 2016-02-05 NOTE — Telephone Encounter (Signed)
Pt notified that she is supposed to take the hydroxyzine instead of meclizine. Pt states she picked the hydroxyzine up yesterday.

## 2016-02-13 ENCOUNTER — Encounter: Payer: Self-pay | Admitting: Family Medicine

## 2016-02-13 ENCOUNTER — Ambulatory Visit (INDEPENDENT_AMBULATORY_CARE_PROVIDER_SITE_OTHER): Payer: Medicare Other | Admitting: Family Medicine

## 2016-02-13 VITALS — BP 124/86 | HR 70 | Wt 287.0 lb

## 2016-02-13 DIAGNOSIS — H938X1 Other specified disorders of right ear: Secondary | ICD-10-CM

## 2016-02-13 NOTE — Patient Instructions (Signed)
Use Afrin nasal spray twice per day for the next 4 or 5 days. If that works then you can stop but if it does not work call then we'll set you up to see an ear nose and throat doctor

## 2016-02-13 NOTE — Progress Notes (Signed)
   Subjective:    Patient ID: Sandra Lawrence, female    DOB: [birth year], 70 y.o.   MRN: [id number]  HPI She is here for recheck. She still feels as if her left ear is clogged up with pressure. She has been on hydroxyzine and this has not helped. No earache, sore throat, fever, chills, cough or congestion.   Review of Systems     Objective:   Physical Exam Alert and in no distress. Tympanic membranes and canals are normal. Pharyngeal area is normal. Neck is supple without adenopathy or thyromegaly. Cardiac exam shows a regular sinus rhythm without murmurs or gallops. Lungs are clear to auscultation. Previous hearing screening showed some minor changes.       Assessment & Plan:  Ear fullness, right I will have her use Afrin nasal spray regularly for the next 4 or 5 days and if no improvement, refer to ENT.

## 2016-02-26 ENCOUNTER — Other Ambulatory Visit: Payer: Self-pay | Admitting: Family Medicine

## 2016-02-26 DIAGNOSIS — Z1231 Encounter for screening mammogram for malignant neoplasm of breast: Secondary | ICD-10-CM

## 2016-03-02 ENCOUNTER — Other Ambulatory Visit: Payer: Self-pay

## 2016-03-02 ENCOUNTER — Telehealth: Payer: Self-pay | Admitting: Family Medicine

## 2016-03-02 ENCOUNTER — Telehealth: Payer: Self-pay

## 2016-03-02 DIAGNOSIS — I119 Hypertensive heart disease without heart failure: Secondary | ICD-10-CM

## 2016-03-02 MED ORDER — PRAVASTATIN SODIUM 40 MG PO TABS: ORAL_TABLET | ORAL | 3 refills | Status: DC

## 2016-03-02 MED ORDER — ALBUTEROL SULFATE HFA 108 (90 BASE) MCG/ACT IN AERS: 2.0000 | INHALATION_SPRAY | Freq: Four times a day (QID) | RESPIRATORY_TRACT | 3 refills | Status: DC | PRN

## 2016-03-02 MED ORDER — ORLISTAT 120 MG PO CAPS: ORAL_CAPSULE | ORAL | 3 refills | Status: DC

## 2016-03-02 MED ORDER — METOPROLOL TARTRATE 25 MG PO TABS: ORAL_TABLET | ORAL | 0 refills | Status: DC

## 2016-03-02 MED ORDER — LOSARTAN POTASSIUM-HCTZ 100-12.5 MG PO TABS: 1.0000 | ORAL_TABLET | Freq: Every day | ORAL | 3 refills | Status: DC

## 2016-03-02 NOTE — Addendum Note (Signed)
Addended by: Minette Headland A on: 03/02/2016 05:15 PM   Modules accepted: Orders

## 2016-03-02 NOTE — Telephone Encounter (Signed)
Recv'd fax from [facility] pt needs refills ProAir, Losartan/HCTZ, Xenical, Pravachol for 90 days to [facility]

## 2016-03-02 NOTE — Telephone Encounter (Signed)
Dr. Redmond School,  Refill request for aspirin and qvar. I do not see this on patient's med list. I see where she was on aspirin 325 in the past and some other doctor prescribed this for her but never had qvar before. Please advise and send correct med in

## 2016-03-02 NOTE — Telephone Encounter (Signed)
Additional fax request for Metoprolol, aspirin and qvar to [facility]

## 2016-03-03 NOTE — Telephone Encounter (Signed)
I see no evidence of this medication. If there is any question have her come back in

## 2016-03-04 ENCOUNTER — Other Ambulatory Visit: Payer: Self-pay

## 2016-03-04 DIAGNOSIS — I119 Hypertensive heart disease without heart failure: Secondary | ICD-10-CM

## 2016-03-04 MED ORDER — LOSARTAN POTASSIUM-HCTZ 100-12.5 MG PO TABS: 1.0000 | ORAL_TABLET | Freq: Every day | ORAL | 3 refills | Status: DC

## 2016-03-04 MED ORDER — ORLISTAT 120 MG PO CAPS: ORAL_CAPSULE | ORAL | 3 refills | Status: DC

## 2016-03-04 MED ORDER — PRAVASTATIN SODIUM 40 MG PO TABS: ORAL_TABLET | ORAL | 3 refills | Status: DC

## 2016-03-04 MED ORDER — ALBUTEROL SULFATE HFA 108 (90 BASE) MCG/ACT IN AERS: 2.0000 | INHALATION_SPRAY | Freq: Four times a day (QID) | RESPIRATORY_TRACT | 3 refills | Status: DC | PRN

## 2016-03-09 ENCOUNTER — Other Ambulatory Visit: Payer: Self-pay | Admitting: Medical

## 2016-03-10 DIAGNOSIS — M5136 Other intervertebral disc degeneration, lumbar region: Secondary | ICD-10-CM | POA: Diagnosis not present

## 2016-03-30 DIAGNOSIS — M5136 Other intervertebral disc degeneration, lumbar region: Secondary | ICD-10-CM | POA: Diagnosis not present

## 2016-04-06 ENCOUNTER — Ambulatory Visit
Admission: RE | Admit: 2016-04-06 | Discharge: 2016-04-06 | Disposition: A | Discharge status: Home / Self Care | Payer: Medicare Other | Source: Ambulatory Visit | Attending: Family Medicine | Admitting: Family Medicine

## 2016-04-06 DIAGNOSIS — Z1231 Encounter for screening mammogram for malignant neoplasm of breast: Secondary | ICD-10-CM | POA: Diagnosis not present

## 2016-04-13 ENCOUNTER — Other Ambulatory Visit: Payer: Self-pay

## 2016-04-13 ENCOUNTER — Telehealth: Payer: Self-pay | Admitting: Family Medicine

## 2016-04-13 MED ORDER — METOPROLOL TARTRATE 25 MG PO TABS: 25.0000 mg | ORAL_TABLET | Freq: Two times a day (BID) | ORAL | 5 refills | Status: DC

## 2016-04-13 NOTE — Telephone Encounter (Signed)
She was taking 25 mg bid she was changed when she was taken off losartan sent refill in

## 2016-04-13 NOTE — Telephone Encounter (Signed)
I see where the med states 1/2 a pill twice a day it was refilled through [facility] 03/02/16 left message for pt to call me back

## 2016-04-13 NOTE — Telephone Encounter (Signed)
Pt called and stated that her metoprolol was increased to 2 aday but rx wasn't changed at the pharmacy. She is now out and cant get it refilled because the pharmacy is telling her it's too early. Please change rx to reflect dose change. [facility] [address]. Pt can be reached at [phone].

## 2016-04-13 NOTE — Telephone Encounter (Signed)
My note says it was filled on 10/17 for half a tablet twice per day. Find out what the deal is

## 2016-04-24 ENCOUNTER — Other Ambulatory Visit: Payer: Self-pay | Admitting: Family Medicine

## 2016-06-08 ENCOUNTER — Telehealth: Payer: Self-pay

## 2016-06-08 ENCOUNTER — Other Ambulatory Visit: Payer: Self-pay

## 2016-06-08 MED ORDER — METOPROLOL TARTRATE 25 MG PO TABS: 25.0000 mg | ORAL_TABLET | Freq: Two times a day (BID) | ORAL | 1 refills | Status: DC

## 2016-06-08 NOTE — Telephone Encounter (Signed)
Faxed [facility] rcvd [facility] [address]. Victorino December

## 2016-06-08 NOTE — Telephone Encounter (Signed)
Med sent in for 90 day

## 2016-06-10 DIAGNOSIS — M25551 Pain in right hip: Secondary | ICD-10-CM | POA: Diagnosis not present

## 2016-06-10 DIAGNOSIS — M5136 Other intervertebral disc degeneration, lumbar region: Secondary | ICD-10-CM | POA: Diagnosis not present

## 2016-06-18 DIAGNOSIS — N951 Menopausal and female climacteric states: Secondary | ICD-10-CM | POA: Diagnosis not present

## 2016-06-21 DIAGNOSIS — E539 Vitamin B deficiency, unspecified: Secondary | ICD-10-CM | POA: Diagnosis not present

## 2016-06-21 DIAGNOSIS — N951 Menopausal and female climacteric states: Secondary | ICD-10-CM | POA: Diagnosis not present

## 2016-06-21 DIAGNOSIS — I10 Essential (primary) hypertension: Secondary | ICD-10-CM | POA: Diagnosis not present

## 2016-06-21 DIAGNOSIS — R7301 Impaired fasting glucose: Secondary | ICD-10-CM | POA: Diagnosis not present

## 2016-06-21 DIAGNOSIS — E782 Mixed hyperlipidemia: Secondary | ICD-10-CM | POA: Diagnosis not present

## 2016-06-22 DIAGNOSIS — M25551 Pain in right hip: Secondary | ICD-10-CM | POA: Diagnosis not present

## 2016-06-28 ENCOUNTER — Encounter: Payer: Self-pay | Admitting: Family Medicine

## 2016-06-28 ENCOUNTER — Ambulatory Visit (INDEPENDENT_AMBULATORY_CARE_PROVIDER_SITE_OTHER): Payer: Medicare Other | Admitting: Family Medicine

## 2016-06-28 VITALS — BP 130/76 | HR 80 | Wt 291.0 lb

## 2016-06-28 DIAGNOSIS — G4730 Sleep apnea, unspecified: Secondary | ICD-10-CM

## 2016-06-28 DIAGNOSIS — I119 Hypertensive heart disease without heart failure: Secondary | ICD-10-CM | POA: Diagnosis not present

## 2016-06-28 DIAGNOSIS — I10 Essential (primary) hypertension: Secondary | ICD-10-CM | POA: Diagnosis not present

## 2016-06-28 DIAGNOSIS — Z6841 Body Mass Index (BMI) 40.0 and over, adult: Secondary | ICD-10-CM

## 2016-06-28 DIAGNOSIS — R7301 Impaired fasting glucose: Secondary | ICD-10-CM | POA: Diagnosis not present

## 2016-06-28 DIAGNOSIS — E539 Vitamin B deficiency, unspecified: Secondary | ICD-10-CM | POA: Diagnosis not present

## 2016-06-28 DIAGNOSIS — E782 Mixed hyperlipidemia: Secondary | ICD-10-CM | POA: Diagnosis not present

## 2016-06-28 NOTE — Progress Notes (Signed)
   Subjective:    Patient ID: Sandra Lawrence, female    DOB: [birth year], 71 y.o.   MRN: [id number]  HPI Earlier today she went to a weight loss clinic and an EKG was done. It did show unifocal PVC as well as inferolateral changes. She is having no chest pain, shortness of breath but she does have occasional bouts of dizziness. She does have underlying OSA but has not been using her CPAP due to excessive dryness.   Review of Systems     Objective:   Physical Exam Alert and in no distress. EKG compared to previous readings and review of cardiology notes from a few years ago shows no acute changes       Assessment & Plan:  Hypertensive heart disease without CHF  Morbid obesity with BMI of 40.0-44.9, adult ([place])  Sleep apnea, unspecified type Reassured her that she was not in any danger and that the EKG showed no changes. We will work to get her CPAP taking care of especially having a heated humidifier. Encouraged her to to continue to work on weight reduction

## 2016-07-06 DIAGNOSIS — E782 Mixed hyperlipidemia: Secondary | ICD-10-CM | POA: Diagnosis not present

## 2016-07-06 DIAGNOSIS — R7301 Impaired fasting glucose: Secondary | ICD-10-CM | POA: Diagnosis not present

## 2016-07-06 DIAGNOSIS — E539 Vitamin B deficiency, unspecified: Secondary | ICD-10-CM | POA: Diagnosis not present

## 2016-07-12 LAB — HM DIABETES EYE EXAM

## 2016-07-13 DIAGNOSIS — E539 Vitamin B deficiency, unspecified: Secondary | ICD-10-CM | POA: Diagnosis not present

## 2016-07-13 DIAGNOSIS — I10 Essential (primary) hypertension: Secondary | ICD-10-CM | POA: Diagnosis not present

## 2016-07-13 DIAGNOSIS — E782 Mixed hyperlipidemia: Secondary | ICD-10-CM | POA: Diagnosis not present

## 2016-07-14 DIAGNOSIS — M5136 Other intervertebral disc degeneration, lumbar region: Secondary | ICD-10-CM | POA: Diagnosis not present

## 2016-07-14 DIAGNOSIS — M47816 Spondylosis without myelopathy or radiculopathy, lumbar region: Secondary | ICD-10-CM | POA: Diagnosis not present

## 2016-07-21 DIAGNOSIS — M7061 Trochanteric bursitis, right hip: Secondary | ICD-10-CM | POA: Diagnosis not present

## 2016-07-27 DIAGNOSIS — E539 Vitamin B deficiency, unspecified: Secondary | ICD-10-CM | POA: Diagnosis not present

## 2016-07-27 DIAGNOSIS — R7301 Impaired fasting glucose: Secondary | ICD-10-CM | POA: Diagnosis not present

## 2016-07-27 DIAGNOSIS — E782 Mixed hyperlipidemia: Secondary | ICD-10-CM | POA: Diagnosis not present

## 2016-08-09 DIAGNOSIS — M5136 Other intervertebral disc degeneration, lumbar region: Secondary | ICD-10-CM | POA: Diagnosis not present

## 2016-08-09 DIAGNOSIS — M47816 Spondylosis without myelopathy or radiculopathy, lumbar region: Secondary | ICD-10-CM | POA: Diagnosis not present

## 2016-08-10 DIAGNOSIS — R7301 Impaired fasting glucose: Secondary | ICD-10-CM | POA: Diagnosis not present

## 2016-08-10 DIAGNOSIS — E782 Mixed hyperlipidemia: Secondary | ICD-10-CM | POA: Diagnosis not present

## 2016-08-17 DIAGNOSIS — E539 Vitamin B deficiency, unspecified: Secondary | ICD-10-CM | POA: Diagnosis not present

## 2016-08-17 DIAGNOSIS — I10 Essential (primary) hypertension: Secondary | ICD-10-CM | POA: Diagnosis not present

## 2016-08-17 DIAGNOSIS — E782 Mixed hyperlipidemia: Secondary | ICD-10-CM | POA: Diagnosis not present

## 2016-08-24 DIAGNOSIS — E782 Mixed hyperlipidemia: Secondary | ICD-10-CM | POA: Diagnosis not present

## 2016-08-24 DIAGNOSIS — R7301 Impaired fasting glucose: Secondary | ICD-10-CM | POA: Diagnosis not present

## 2016-08-24 DIAGNOSIS — E539 Vitamin B deficiency, unspecified: Secondary | ICD-10-CM | POA: Diagnosis not present

## 2016-08-31 DIAGNOSIS — E539 Vitamin B deficiency, unspecified: Secondary | ICD-10-CM | POA: Diagnosis not present

## 2016-08-31 DIAGNOSIS — R7301 Impaired fasting glucose: Secondary | ICD-10-CM | POA: Diagnosis not present

## 2016-08-31 DIAGNOSIS — E782 Mixed hyperlipidemia: Secondary | ICD-10-CM | POA: Diagnosis not present

## 2016-09-01 ENCOUNTER — Telehealth: Payer: Self-pay | Admitting: Family Medicine

## 2016-09-01 NOTE — Telephone Encounter (Signed)
Called 2 times to see if we could get her in for a medicare well visit with dr Redmond School left message

## 2016-09-17 DIAGNOSIS — I10 Essential (primary) hypertension: Secondary | ICD-10-CM | POA: Diagnosis not present

## 2016-09-17 DIAGNOSIS — E782 Mixed hyperlipidemia: Secondary | ICD-10-CM | POA: Diagnosis not present

## 2016-11-04 ENCOUNTER — Ambulatory Visit (INDEPENDENT_AMBULATORY_CARE_PROVIDER_SITE_OTHER): Payer: Medicare Other | Admitting: Family Medicine

## 2016-11-04 ENCOUNTER — Other Ambulatory Visit: Payer: Self-pay

## 2016-11-04 ENCOUNTER — Encounter: Payer: Self-pay | Admitting: Family Medicine

## 2016-11-04 VITALS — BP 140/92 | HR 64 | Ht 69.0 in | Wt 286.0 lb

## 2016-11-04 DIAGNOSIS — N3281 Overactive bladder: Secondary | ICD-10-CM

## 2016-11-04 DIAGNOSIS — E119 Type 2 diabetes mellitus without complications: Secondary | ICD-10-CM | POA: Diagnosis not present

## 2016-11-04 DIAGNOSIS — I119 Hypertensive heart disease without heart failure: Secondary | ICD-10-CM

## 2016-11-04 DIAGNOSIS — G4730 Sleep apnea, unspecified: Secondary | ICD-10-CM | POA: Diagnosis not present

## 2016-11-04 DIAGNOSIS — L509 Urticaria, unspecified: Secondary | ICD-10-CM

## 2016-11-04 DIAGNOSIS — I2510 Atherosclerotic heart disease of native coronary artery without angina pectoris: Secondary | ICD-10-CM

## 2016-11-04 DIAGNOSIS — E785 Hyperlipidemia, unspecified: Secondary | ICD-10-CM | POA: Diagnosis not present

## 2016-11-04 DIAGNOSIS — E1169 Type 2 diabetes mellitus with other specified complication: Secondary | ICD-10-CM

## 2016-11-04 DIAGNOSIS — Z0000 Encounter for general adult medical examination without abnormal findings: Secondary | ICD-10-CM

## 2016-11-04 DIAGNOSIS — Z96651 Presence of right artificial knee joint: Secondary | ICD-10-CM

## 2016-11-04 DIAGNOSIS — M545 Low back pain, unspecified: Secondary | ICD-10-CM

## 2016-11-04 DIAGNOSIS — M5136 Other intervertebral disc degeneration, lumbar region: Secondary | ICD-10-CM

## 2016-11-04 DIAGNOSIS — I2583 Coronary atherosclerosis due to lipid rich plaque: Secondary | ICD-10-CM

## 2016-11-04 DIAGNOSIS — Z23 Encounter for immunization: Secondary | ICD-10-CM | POA: Diagnosis not present

## 2016-11-04 DIAGNOSIS — E1136 Type 2 diabetes mellitus with diabetic cataract: Secondary | ICD-10-CM

## 2016-11-04 DIAGNOSIS — Z6841 Body Mass Index (BMI) 40.0 and over, adult: Secondary | ICD-10-CM

## 2016-11-04 DIAGNOSIS — R319 Hematuria, unspecified: Secondary | ICD-10-CM

## 2016-11-04 HISTORY — DX: Overactive bladder: N32.81

## 2016-11-04 LAB — POCT URINALYSIS DIP (PROADVANTAGE DEVICE)
Bilirubin, UA: NEGATIVE
Glucose, UA: NEGATIVE mg/dL
Ketones, POC UA: NEGATIVE mg/dL
LEUKOCYTES UA: NEGATIVE
NITRITE UA: NEGATIVE
Specific Gravity, Urine: 1.025
pH, UA: 6 (ref 5.0–8.0)

## 2016-11-04 LAB — CBC WITH DIFFERENTIAL/PLATELET
BASOS ABS: 52 {cells}/uL (ref 0–200)
Basophils Relative: 1 %
EOS PCT: 7 %
Eosinophils Absolute: 364 cells/uL (ref 15–500)
HEMATOCRIT: 38.5 % (ref 35.0–45.0)
HEMOGLOBIN: 12.1 g/dL (ref 11.7–15.5)
LYMPHS PCT: 34 %
Lymphs Abs: 1768 cells/uL (ref 850–3900)
MCH: 27.7 pg (ref 27.0–33.0)
MCHC: 31.4 g/dL — ABNORMAL LOW (ref 32.0–36.0)
MCV: 88.1 fL (ref 80.0–100.0)
MONO ABS: 468 {cells}/uL (ref 200–950)
MPV: 11.4 fL (ref 7.5–12.5)
Monocytes Relative: 9 %
NEUTROS PCT: 49 %
Neutro Abs: 2548 cells/uL (ref 1500–7800)
Platelets: 267 10*3/uL (ref 140–400)
RBC: 4.37 MIL/uL (ref 3.80–5.10)
RDW: 14.9 % (ref 11.0–15.0)
WBC: 5.2 10*3/uL (ref 4.0–10.5)

## 2016-11-04 LAB — POCT GLYCOSYLATED HEMOGLOBIN (HGB A1C): HEMOGLOBIN A1C: 7.1

## 2016-11-04 LAB — POCT UA - MICROALBUMIN
ALBUMIN/CREATININE RATIO, URINE, POC: 32
Creatinine, POC: 156.5 mg/dL
Microalbumin Ur, POC: 50.1 mg/L

## 2016-11-04 MED ORDER — GLUCOSE BLOOD VI STRP: ORAL_STRIP | 4 refills | Status: DC

## 2016-11-04 MED ORDER — ONETOUCH DELICA LANCETS FINE MISC: 4 refills | Status: DC

## 2016-11-04 NOTE — Patient Instructions (Addendum)
Stop the gabapentin and let me know how you do Cut back on "white food"
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
## 2016-11-05 ENCOUNTER — Other Ambulatory Visit: Payer: Self-pay

## 2016-11-05 DIAGNOSIS — R319 Hematuria, unspecified: Secondary | ICD-10-CM | POA: Diagnosis not present

## 2016-11-05 DIAGNOSIS — R3129 Other microscopic hematuria: Secondary | ICD-10-CM

## 2016-11-05 LAB — LIPID PANEL
CHOL/HDL RATIO: 3.9 ratio (ref <5.0)
Cholesterol: 168 mg/dL (ref <200)
HDL: 43 mg/dL — AB (ref >50)
LDL Cholesterol: 105 mg/dL — ABNORMAL HIGH (ref <100)
TRIGLYCERIDES: 102 mg/dL (ref <150)
VLDL: 20 mg/dL (ref <30)

## 2016-11-05 LAB — COMPREHENSIVE METABOLIC PANEL
ALBUMIN: 3.5 g/dL — AB (ref 3.6–5.1)
ALT: 9 U/L (ref 6–29)
AST: 11 U/L (ref 10–35)
Alkaline Phosphatase: 93 U/L (ref 33–130)
BILIRUBIN TOTAL: 0.3 mg/dL (ref 0.2–1.2)
BUN: 11 mg/dL (ref 7–25)
CALCIUM: 8.9 mg/dL (ref 8.6–10.4)
CO2: 21 mmol/L (ref 20–31)
Chloride: 105 mmol/L (ref 98–110)
Creat: 0.61 mg/dL (ref 0.50–0.99)
GLUCOSE: 104 mg/dL — AB (ref 65–99)
Potassium: 3.5 mmol/L (ref 3.5–5.3)
Sodium: 139 mmol/L (ref 135–146)
Total Protein: 6.8 g/dL (ref 6.1–8.1)

## 2016-11-05 NOTE — Addendum Note (Signed)
Addended by: Arley Phenix L on: 11/05/2016 08:28 AM   Modules accepted: Orders

## 2016-11-06 LAB — URINE CULTURE: Organism ID, Bacteria: NO GROWTH

## 2016-11-10 NOTE — Progress Notes (Signed)
Send to urology for eval of hematuria

## 2016-11-26 ENCOUNTER — Telehealth: Payer: Self-pay | Admitting: Family Medicine

## 2016-11-26 ENCOUNTER — Other Ambulatory Visit: Payer: Self-pay | Admitting: Medical

## 2016-11-26 NOTE — Telephone Encounter (Signed)
Pt said she actually has never received an official script for overactive bladder. Was given samples of a med at her last visit. It was Myrbetriq. Pt said this med helped her move out of adult diapers and he would like to get a script.  Also she is still itching even though she stopped the med that Dr Redmond School asked her to stop. What should she do about the itching?

## 2016-11-26 NOTE — Telephone Encounter (Signed)
Pt left a voice mail message stating that she needs a refill on her overactive bladder med. Also she wanted Dr Redmond School to know that she is still itching even though she stopped the med that he asked her to stop. What should she do?

## 2016-11-26 NOTE — Telephone Encounter (Signed)
I don't see a medication listed for overactive bladder.  Which medication?  Lets defer the other question to Dr. Redmond School

## 2016-12-08 ENCOUNTER — Encounter (HOSPITAL_BASED_OUTPATIENT_CLINIC_OR_DEPARTMENT_OTHER): Payer: Medicare Other

## 2016-12-10 ENCOUNTER — Telehealth: Payer: Self-pay | Admitting: Family Medicine

## 2016-12-10 NOTE — Telephone Encounter (Signed)
Pt was given samples of a med at her last visit. It was Myrbetriq. Pt said this med helped her move out of adult diapers and she would like to get a script for this med  Also she is still itching even though she stopped the med that Dr Redmond School asked her to stop. What should she do about the itching?

## 2016-12-12 NOTE — Telephone Encounter (Signed)
Have her go back on the medication  (gabalpentin) and use Allegra for the itching

## 2016-12-13 ENCOUNTER — Other Ambulatory Visit: Payer: Self-pay

## 2016-12-13 MED ORDER — GLUCOSE BLOOD VI STRP: ORAL_STRIP | 4 refills | Status: DC

## 2016-12-13 MED ORDER — MIRABEGRON ER 25 MG PO TB24: 25.0000 mg | ORAL_TABLET | Freq: Every day | ORAL | 5 refills | Status: DC

## 2016-12-13 MED ORDER — FEXOFENADINE HCL 180 MG PO TABS: 180.0000 mg | ORAL_TABLET | Freq: Every day | ORAL | 5 refills | Status: DC

## 2016-12-13 NOTE — Telephone Encounter (Signed)
Pt informed and verbalized understanding

## 2016-12-17 ENCOUNTER — Ambulatory Visit (HOSPITAL_BASED_OUTPATIENT_CLINIC_OR_DEPARTMENT_OTHER): Payer: Medicare Other | Attending: Family Medicine | Admitting: Internal Medicine

## 2016-12-17 DIAGNOSIS — G4733 Obstructive sleep apnea (adult) (pediatric): Secondary | ICD-10-CM | POA: Diagnosis not present

## 2016-12-17 DIAGNOSIS — G4730 Sleep apnea, unspecified: Secondary | ICD-10-CM | POA: Diagnosis present

## 2016-12-22 ENCOUNTER — Other Ambulatory Visit (HOSPITAL_BASED_OUTPATIENT_CLINIC_OR_DEPARTMENT_OTHER): Payer: Self-pay

## 2016-12-22 DIAGNOSIS — G4730 Sleep apnea, unspecified: Secondary | ICD-10-CM

## 2016-12-25 DIAGNOSIS — G4733 Obstructive sleep apnea (adult) (pediatric): Secondary | ICD-10-CM | POA: Diagnosis not present

## 2016-12-25 NOTE — Procedures (Signed)
  Patient Name: Sandra Lawrence, Veno Date: 12/17/2016 Gender: Female D.O.B: September 04, 1945 Age (years): 62 Referring Provider: Denita Lung Height (inches): 30 Interpreting Physician: Baird Lyons MD, ABSM Weight (lbs): 283 RPSGT: Jacolyn Reedy BMI: 44 MRN: [id number] Neck Size: 16.00 CLINICAL INFORMATION Sleep Study Type: unattended HST  Indication for sleep study: OSA  Epworth Sleepiness Score: 13  SLEEP STUDY TECHNIQUE A multi-channel overnight portable sleep study was performed. The channels recorded were: nasal airflow, thoracic respiratory movement, and oxygen saturation with a pulse oximetry. Snoring was also monitored.  MEDICATIONS Patient self administered medications include: none reported.  SLEEP ARCHITECTURE Patient was studied for 447.0 minutes. The sleep efficiency was 100.0 % and the patient was supine for 55.9%. The arousal index was 0.0 per hour.  RESPIRATORY PARAMETERS The overall AHI was 9.5 per hour, with a central apnea index of 0.1 per hour.  The oxygen nadir was 83% during sleep.  CARDIAC DATA Mean heart rate during sleep was 68.1 bpm.  IMPRESSIONS - Mild obstructive sleep apnea occurred during this study (AHI = 9.5/h). - No significant central sleep apnea occurred during this study (CAI = 0.1/h). - Moderate oxygen desaturation was noted during this study (Min O2 = 83%). - Patient snored 1.2% during the sleep.  DIAGNOSIS - Obstructive Sleep Apnea (327.23 [G47.33 ICD-10])  RECOMMENDATIONS - CPAP, a fitted oral appliance, ENT evaluation, or conservative measures such as a chin strap, might be appropriate, based on clinical judgment. - Sleep hygiene should be reviewed to assess factors that may improve sleep quality. - Weight management and regular exercise should be initiated or continued.  [Electronically signed] 12/25/2016 11:08 AM  Baird Lyons MD, ABSM Diplomate, American Board of Sleep Medicine   NPI: [id number] [place], American Board of Sleep Medicine  ELECTRONICALLY SIGNED ON:  12/25/2016, 11:04 AM [place] PH: [phone]   FX: [phone] [place]

## 2017-01-06 ENCOUNTER — Ambulatory Visit (INDEPENDENT_AMBULATORY_CARE_PROVIDER_SITE_OTHER): Payer: Medicare Other | Admitting: Family Medicine

## 2017-01-06 VITALS — BP 152/98 | HR 72 | Wt 288.0 lb

## 2017-01-06 DIAGNOSIS — G4733 Obstructive sleep apnea (adult) (pediatric): Secondary | ICD-10-CM | POA: Diagnosis not present

## 2017-01-06 NOTE — Progress Notes (Signed)
   Subjective:    Patient ID: Sandra Lawrence, female    DOB: 1945-07-12, 71 y.o.   MRN: [id number]  HPI She is here for consult concerning a recent sleep study. The sleep study did show OSA with an AHI of 9.5.   Review of Systems     Objective:   Physical Exam Alert and in no distress otherwise not examined       Assessment & Plan:  OSA (obstructive sleep apnea) I discussed the diagnosis of OSA in regard to diet, exercise, CPAP appliances, dental, weight loss. I doubt an appliance would be of much benefit. Her financial status is quite minimal in that she also has to go to a fluid Joylene Draft can order to get enough food. Joining Silver sneakers and using the pool could also be a financial burden on her. I have asked her to check on Silver sneakers again. I'll also refer her to nutritionist through her insurance to see if this can help. Otherwise we are kind of stuck with not much to really help.

## 2017-01-12 ENCOUNTER — Telehealth: Payer: Self-pay

## 2017-01-12 ENCOUNTER — Other Ambulatory Visit: Payer: Self-pay

## 2017-01-12 MED ORDER — OMEGA 3 1000 MG PO CAPS: 1.0000 | ORAL_CAPSULE | Freq: Every day | ORAL | 3 refills | Status: DC

## 2017-01-12 MED ORDER — NAPROXEN SODIUM 220 MG PO TABS: 220.0000 mg | ORAL_TABLET | Freq: Two times a day (BID) | ORAL | 3 refills | Status: DC

## 2017-01-12 NOTE — Telephone Encounter (Signed)
Fax request rcvd for pt to get her naproxen and omega 3 fish oil sent to [facility] [phone] is the fax. Victorino December

## 2017-01-12 NOTE — Telephone Encounter (Signed)
meds sent in

## 2017-01-13 ENCOUNTER — Telehealth: Payer: Self-pay | Admitting: Family Medicine

## 2017-01-13 NOTE — Telephone Encounter (Signed)
Pt said the Myrbetriq 25 mg is not working.  She said the sample you gave her worked, but she does not know what strength.  Please advise pt [place]

## 2017-01-13 NOTE — Telephone Encounter (Signed)
Tell her to take 2 of those pills and see what that does give her some samples of the 50 mg

## 2017-01-14 ENCOUNTER — Telehealth: Payer: Self-pay

## 2017-01-14 MED ORDER — MIRABEGRON ER 50 MG PO TB24: 50.0000 mg | ORAL_TABLET | Freq: Every day | ORAL | 0 refills | Status: DC

## 2017-01-14 MED ORDER — OMEGA-3-ACID ETHYL ESTERS 1 G PO CAPS: 2.0000 g | ORAL_CAPSULE | Freq: Two times a day (BID) | ORAL | 2 refills | Status: DC

## 2017-01-14 NOTE — Telephone Encounter (Signed)
Two twice a day

## 2017-01-14 NOTE — Telephone Encounter (Signed)
rx for lovaza called in and contacted [facility] to clarify. Sandra Lawrence

## 2017-01-14 NOTE — Telephone Encounter (Signed)
Spoke with pt- she will try 50mg  Myrbetriq daily. # 14 samples dispensed for pt to pick up at front desk. Victorino December

## 2017-01-14 NOTE — Telephone Encounter (Signed)
[facility] called for clarification on pt's dosing on Omega 3. They received script for OTC Omega 3 for 1 tablet daily, but wanted to make sure script wasn't for Lovaza 2 tabs bid?   CB # [id number].  Thanks, RLB

## 2017-02-10 DIAGNOSIS — M5136 Other intervertebral disc degeneration, lumbar region: Secondary | ICD-10-CM | POA: Diagnosis not present

## 2017-02-10 DIAGNOSIS — M7061 Trochanteric bursitis, right hip: Secondary | ICD-10-CM | POA: Diagnosis not present

## 2017-02-15 ENCOUNTER — Other Ambulatory Visit: Payer: Self-pay

## 2017-02-15 ENCOUNTER — Telehealth: Payer: Self-pay | Admitting: Family Medicine

## 2017-02-15 MED ORDER — GLUCOSE BLOOD VI STRP: ORAL_STRIP | 4 refills | Status: DC

## 2017-02-15 MED ORDER — ONETOUCH DELICA LANCETS FINE MISC: 4 refills | Status: DC

## 2017-02-15 MED ORDER — HYDROCORTISONE VALERATE 0.2 % EX CREA: 1.0000 "application " | TOPICAL_CREAM | Freq: Two times a day (BID) | CUTANEOUS | 0 refills | Status: DC

## 2017-02-15 NOTE — Telephone Encounter (Signed)
[facility] req refill for Glucose Meter, Alcohol pades, Test strips and lancets

## 2017-02-15 NOTE — Telephone Encounter (Signed)
[facility] req Hydrocortisone Valerate Cream  0.2%  Apply 1 gram up to 2 times daily to affected area.  180 grm   90 day supply

## 2017-02-15 NOTE — Telephone Encounter (Signed)
Pt states she only needs test strips and lancets right now. She test once a day

## 2017-02-15 NOTE — Telephone Encounter (Signed)
ok 

## 2017-02-15 NOTE — Telephone Encounter (Signed)
done

## 2017-03-07 ENCOUNTER — Other Ambulatory Visit: Payer: Self-pay | Admitting: Family Medicine

## 2017-03-07 ENCOUNTER — Other Ambulatory Visit: Payer: Self-pay

## 2017-03-07 DIAGNOSIS — I119 Hypertensive heart disease without heart failure: Secondary | ICD-10-CM

## 2017-03-07 NOTE — Patient Outreach (Signed)
[facility]) Care Management  03/07/2017  TORIN WHISNER 1946/02/13 [id number]    Medication Adherence call to Mrs. Trysten Berti the reason for this call is because Mrs. Forinash is showing past due under [facility] Ins.on two of her medication Losartan/HCTZ 100/12.5 mg and Pravastatin 40 mg  spoke to patient she said she was almost out and if we can call [facility] and order her medication for her,she also said  she has not pick up her medication because she was helping her daughter and kids and she did not have the money to pay for it. But from now on she is going to taking her medication on a regular basis.call doctor office and left a message for them to call a refill on both medication.    [facility] Management Direct Dial [phone]  Fax [phone] [email or web address]

## 2017-03-10 ENCOUNTER — Telehealth: Payer: Self-pay | Admitting: Family Medicine

## 2017-03-10 NOTE — Telephone Encounter (Signed)
Received requested eye exam. Sending back for review.  °

## 2017-03-21 DIAGNOSIS — H269 Unspecified cataract: Secondary | ICD-10-CM | POA: Insufficient documentation

## 2017-03-21 HISTORY — DX: Unspecified cataract: H26.9

## 2017-04-19 ENCOUNTER — Telehealth: Payer: Self-pay | Admitting: Family Medicine

## 2017-04-19 NOTE — Telephone Encounter (Signed)
Spoke with pt- she reports that she has plenty of Lidocaine ointment. She states she does not need additional refills at this time. Sandra Lawrence

## 2017-04-19 NOTE — Telephone Encounter (Signed)
Received refilll request from [facility] req Lidocaine Cream  5%  Apply 2 to 4 grams up to 3 times daily to affected area.  1063.20 gm 88 day supply

## 2017-04-19 NOTE — Telephone Encounter (Signed)
Let her know that if she has questions about that to schedule an appointment

## 2017-05-13 ENCOUNTER — Telehealth: Payer: Self-pay | Admitting: Family Medicine

## 2017-05-13 MED ORDER — PRAVASTATIN SODIUM 40 MG PO TABS: 40.0000 mg | ORAL_TABLET | Freq: Every day | ORAL | 1 refills | Status: DC

## 2017-05-13 NOTE — Telephone Encounter (Signed)
Pt called and states that she has she has lost her pravastatin she is wondering if you would send her in a 30 day supply she has not idea where she lost it at, she would [id number] [place], [place] informed pt that her insurance may not cover it, pt can be reached at [phone]

## 2017-07-08 ENCOUNTER — Other Ambulatory Visit: Payer: Self-pay | Admitting: Family Medicine

## 2017-07-08 DIAGNOSIS — Z1231 Encounter for screening mammogram for malignant neoplasm of breast: Secondary | ICD-10-CM

## 2017-07-28 ENCOUNTER — Ambulatory Visit: Payer: Medicare Other

## 2017-08-02 ENCOUNTER — Ambulatory Visit
Admission: RE | Admit: 2017-08-02 | Discharge: 2017-08-02 | Disposition: A | Discharge status: Home / Self Care | Payer: Medicare Other | Source: Ambulatory Visit | Attending: Family Medicine | Admitting: Family Medicine

## 2017-08-02 ENCOUNTER — Other Ambulatory Visit: Payer: Self-pay | Admitting: Family Medicine

## 2017-08-02 DIAGNOSIS — Z1231 Encounter for screening mammogram for malignant neoplasm of breast: Secondary | ICD-10-CM

## 2017-08-02 DIAGNOSIS — J4520 Mild intermittent asthma, uncomplicated: Secondary | ICD-10-CM

## 2017-08-02 NOTE — Telephone Encounter (Signed)
[facility] on pt Albuterol inhaler. Please advise. [place]

## 2017-08-03 ENCOUNTER — Other Ambulatory Visit: Payer: Self-pay | Admitting: Family Medicine

## 2017-08-03 DIAGNOSIS — R928 Other abnormal and inconclusive findings on diagnostic imaging of breast: Secondary | ICD-10-CM

## 2017-08-08 ENCOUNTER — Ambulatory Visit
Admission: RE | Admit: 2017-08-08 | Discharge: 2017-08-08 | Disposition: A | Discharge status: Home / Self Care | Payer: Medicare Other | Source: Ambulatory Visit | Attending: Family Medicine | Admitting: Family Medicine

## 2017-08-08 DIAGNOSIS — R928 Other abnormal and inconclusive findings on diagnostic imaging of breast: Secondary | ICD-10-CM

## 2017-08-11 ENCOUNTER — Encounter: Payer: Self-pay | Admitting: Internal Medicine

## 2017-08-29 ENCOUNTER — Encounter: Payer: Self-pay | Admitting: Internal Medicine

## 2017-09-17 ENCOUNTER — Emergency Department (HOSPITAL_COMMUNITY)
Admission: EM | Admit: 2017-09-17 | Discharge: 2017-09-17 | Disposition: A | Discharge status: Home / Self Care | Payer: Medicare Other | Attending: Emergency Medicine | Admitting: Emergency Medicine

## 2017-09-17 ENCOUNTER — Encounter (HOSPITAL_COMMUNITY): Payer: Self-pay | Admitting: *Deleted

## 2017-09-17 ENCOUNTER — Other Ambulatory Visit: Payer: Self-pay

## 2017-09-17 DIAGNOSIS — J45909 Unspecified asthma, uncomplicated: Secondary | ICD-10-CM | POA: Diagnosis not present

## 2017-09-17 DIAGNOSIS — Z87891 Personal history of nicotine dependence: Secondary | ICD-10-CM | POA: Insufficient documentation

## 2017-09-17 DIAGNOSIS — Z7982 Long term (current) use of aspirin: Secondary | ICD-10-CM | POA: Diagnosis not present

## 2017-09-17 DIAGNOSIS — Z79899 Other long term (current) drug therapy: Secondary | ICD-10-CM | POA: Insufficient documentation

## 2017-09-17 DIAGNOSIS — R040 Epistaxis: Secondary | ICD-10-CM | POA: Diagnosis present

## 2017-09-17 DIAGNOSIS — E119 Type 2 diabetes mellitus without complications: Secondary | ICD-10-CM | POA: Insufficient documentation

## 2017-09-17 DIAGNOSIS — I10 Essential (primary) hypertension: Secondary | ICD-10-CM | POA: Diagnosis not present

## 2017-09-17 DIAGNOSIS — I2510 Atherosclerotic heart disease of native coronary artery without angina pectoris: Secondary | ICD-10-CM | POA: Insufficient documentation

## 2017-09-17 MED ORDER — PHENYLEPHRINE HCL 0.5 % NA SOLN
1.0000 [drp] | Freq: Once | NASAL | Status: AC
  Administered 2017-09-17: 1 [drp] via NASAL
  Filled 2017-09-17: qty 15

## 2017-09-17 NOTE — ED Triage Notes (Signed)
thed pt has had a nose bled since last pm  Some bleeding at present

## 2017-09-17 NOTE — Discharge Instructions (Addendum)
Return here as needed.  Follow-up with the ear nose and throat doctor by calling Monday morning for an appointment soon as they can see you.  Next with that she tell them that she had packing in your nose. also follow-up with your doctor.  He noticed some oozing but should not have any brisk bleeding if you do you need to return here.

## 2017-09-17 NOTE — ED Notes (Signed)
Pt requesting apple juice and graham crackers. OK per RN Judson Roch pt given the same

## 2017-09-17 NOTE — ED Notes (Signed)
Hooked patient back up to the monitor patient is resting with family at bedside and call bell in reach 

## 2017-09-17 NOTE — ED Notes (Signed)
Pts nose started to bleed again. Irena Cords, PA notified.
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
## 2017-10-20 ENCOUNTER — Ambulatory Visit (AMBULATORY_SURGERY_CENTER): Payer: Self-pay | Admitting: *Deleted

## 2017-10-20 ENCOUNTER — Other Ambulatory Visit: Payer: Self-pay

## 2017-10-20 VITALS — Ht 67.0 in | Wt 294.8 lb

## 2017-10-20 DIAGNOSIS — Z86010 Personal history of colonic polyps: Secondary | ICD-10-CM

## 2017-10-20 MED ORDER — NA SULFATE-K SULFATE-MG SULF 17.5-3.13-1.6 GM/177ML PO SOLN: 1.0000 [IU] | Freq: Once | ORAL | 0 refills | Status: AC

## 2017-10-20 NOTE — Progress Notes (Signed)
No egg or soy allergy known to patient  No issues with past sedation with any surgeries  or procedures, no intubation problems  No diet pills per patient No home 02 use per patient  No blood thinners per patient  Pt has issues with constipation pt. Takes an enema or mag. citrate No A fib or A flutter  EMMI video sent to pt's e mail

## 2017-10-24 ENCOUNTER — Encounter: Payer: Self-pay | Admitting: Internal Medicine

## 2017-11-03 ENCOUNTER — Encounter: Payer: Medicare Other | Admitting: Internal Medicine

## 2017-11-03 ENCOUNTER — Encounter: Payer: Self-pay | Admitting: Family Medicine

## 2017-11-03 ENCOUNTER — Ambulatory Visit (INDEPENDENT_AMBULATORY_CARE_PROVIDER_SITE_OTHER): Payer: Medicare Other | Admitting: Family Medicine

## 2017-11-03 VITALS — BP 162/90 | HR 77 | Temp 98.8°F | Wt 295.0 lb

## 2017-11-03 DIAGNOSIS — J4521 Mild intermittent asthma with (acute) exacerbation: Secondary | ICD-10-CM | POA: Diagnosis not present

## 2017-11-03 DIAGNOSIS — J209 Acute bronchitis, unspecified: Secondary | ICD-10-CM

## 2017-11-03 MED ORDER — AMOXICILLIN 875 MG PO TABS
875.0000 mg | ORAL_TABLET | Freq: Two times a day (BID) | ORAL | 0 refills | Status: DC
Start: 2017-11-03 — End: 2019-02-17

## 2017-11-03 NOTE — Progress Notes (Signed)
  Subjective:     Patient ID: Sandra Lawrence, female   DOB: 1945-08-19, 72 y.o.   MRN: [id number]  HPI She presents today with a 4 month history of wheezing and coughing which has been worse over the past 2 weeks. C/o chest pain with inspiration, fatigue and a productive cough of yellow mucous that is worse when she has been outside all day and with activity. Symptoms relieved with albuterol inhaler. Denies headache, dizziness, shortness of breath or fever. Concerned about having to use her rescue inhaler often. Has a history of asthma but hasn't had complications in 3-4 years. Was on QVAR daily in the past.   Also states she can only stand up for a couple minutes at a time as her back pain "takes over". She has naproxen but does not like to take it.   Review of Systems     Objective:   Physical Exam Alert and in no distress. Tympanic membranes and canals are normal. Pharyngeal area is normal. Neck is supple without adenopathy or thyromegaly. Cardiac exam shows a regular sinus rhythm without murmurs or gallops. Lungs are clear to auscultation.     Assessment:    Acute bronchitis, unspecified organism  Mild intermittent asthma with acute exacerbation       Plan:     Acute bronchitis, unspecified organism - Plan: amoxicillin (AMOXIL) 875 MG tablet  Mild intermittent asthma with acute exacerbation  Continue to use the inhaler.  Call in 2 weeks to let me know how she is doing.  May possibly need to place her back on her asthma inhaler. Also recommend she come back to talk further about her various aches and pains.  She did ask for a chairlift

## 2017-11-03 NOTE — Patient Instructions (Signed)
Take all the antibiotic and call me when you finish to let me know how you are doing okay and will call at stuff and and so we will begin on my first thought would be willing to do is get you more physically active and build her strength of below what will need more time to discuss that set that up to go over this again even with

## 2017-12-07 ENCOUNTER — Telehealth: Payer: Self-pay | Admitting: Family Medicine

## 2017-12-07 NOTE — Telephone Encounter (Signed)
Called pt advised her she needs diabetic follow up and a1c, she will call me back.

## 2017-12-18 ENCOUNTER — Other Ambulatory Visit: Payer: Self-pay | Admitting: Family Medicine

## 2017-12-19 ENCOUNTER — Other Ambulatory Visit: Payer: Self-pay

## 2017-12-19 MED ORDER — PRAVASTATIN SODIUM 40 MG PO TABS: 40.0000 mg | ORAL_TABLET | Freq: Every day | ORAL | 1 refills | Status: DC

## 2017-12-19 NOTE — Telephone Encounter (Signed)
Called pt due to her needing an appt to recheck her diabetes . Pt will be in this Thursday [facility]

## 2017-12-22 ENCOUNTER — Ambulatory Visit (INDEPENDENT_AMBULATORY_CARE_PROVIDER_SITE_OTHER): Payer: Medicare Other | Admitting: Family Medicine

## 2017-12-22 ENCOUNTER — Other Ambulatory Visit: Payer: Self-pay | Admitting: Family Medicine

## 2017-12-22 ENCOUNTER — Encounter: Payer: Self-pay | Admitting: Family Medicine

## 2017-12-22 VITALS — BP 130/84 | HR 79 | Temp 97.9°F | Wt 297.4 lb

## 2017-12-22 DIAGNOSIS — M1990 Unspecified osteoarthritis, unspecified site: Secondary | ICD-10-CM

## 2017-12-22 DIAGNOSIS — J4521 Mild intermittent asthma with (acute) exacerbation: Secondary | ICD-10-CM | POA: Diagnosis not present

## 2017-12-22 DIAGNOSIS — Z860100 Personal history of colon polyps, unspecified: Secondary | ICD-10-CM

## 2017-12-22 DIAGNOSIS — M51369 Other intervertebral disc degeneration, lumbar region without mention of lumbar back pain or lower extremity pain: Secondary | ICD-10-CM

## 2017-12-22 DIAGNOSIS — J309 Allergic rhinitis, unspecified: Secondary | ICD-10-CM

## 2017-12-22 DIAGNOSIS — I119 Hypertensive heart disease without heart failure: Secondary | ICD-10-CM

## 2017-12-22 DIAGNOSIS — E785 Hyperlipidemia, unspecified: Secondary | ICD-10-CM

## 2017-12-22 DIAGNOSIS — J4520 Mild intermittent asthma, uncomplicated: Secondary | ICD-10-CM

## 2017-12-22 DIAGNOSIS — M545 Low back pain, unspecified: Secondary | ICD-10-CM

## 2017-12-22 DIAGNOSIS — I2583 Coronary atherosclerosis due to lipid rich plaque: Secondary | ICD-10-CM

## 2017-12-22 DIAGNOSIS — E119 Type 2 diabetes mellitus without complications: Secondary | ICD-10-CM

## 2017-12-22 DIAGNOSIS — Z6841 Body Mass Index (BMI) 40.0 and over, adult: Secondary | ICD-10-CM

## 2017-12-22 DIAGNOSIS — M5136 Other intervertebral disc degeneration, lumbar region: Secondary | ICD-10-CM

## 2017-12-22 DIAGNOSIS — N3281 Overactive bladder: Secondary | ICD-10-CM

## 2017-12-22 DIAGNOSIS — G4730 Sleep apnea, unspecified: Secondary | ICD-10-CM

## 2017-12-22 DIAGNOSIS — E1136 Type 2 diabetes mellitus with diabetic cataract: Secondary | ICD-10-CM

## 2017-12-22 DIAGNOSIS — E1169 Type 2 diabetes mellitus with other specified complication: Secondary | ICD-10-CM

## 2017-12-22 DIAGNOSIS — I2510 Atherosclerotic heart disease of native coronary artery without angina pectoris: Secondary | ICD-10-CM

## 2017-12-22 DIAGNOSIS — Z96651 Presence of right artificial knee joint: Secondary | ICD-10-CM

## 2017-12-22 DIAGNOSIS — G4733 Obstructive sleep apnea (adult) (pediatric): Secondary | ICD-10-CM

## 2017-12-22 MED ORDER — ALBUTEROL SULFATE HFA 108 (90 BASE) MCG/ACT IN AERS: INHALATION_SPRAY | RESPIRATORY_TRACT | 1 refills | Status: DC

## 2017-12-22 MED ORDER — TOLTERODINE TARTRATE 1 MG PO TABS: 1.0000 mg | ORAL_TABLET | Freq: Two times a day (BID) | ORAL | 3 refills | Status: DC

## 2017-12-22 MED ORDER — METOPROLOL TARTRATE 25 MG PO TABS: 25.0000 mg | ORAL_TABLET | Freq: Two times a day (BID) | ORAL | 3 refills | Status: DC

## 2017-12-22 MED ORDER — FEXOFENADINE HCL 180 MG PO TABS: 180.0000 mg | ORAL_TABLET | Freq: Every day | ORAL | 5 refills | Status: DC

## 2017-12-22 MED ORDER — BECLOMETHASONE DIPROPIONATE 40 MCG/ACT IN AERS: 1.0000 | INHALATION_SPRAY | Freq: Two times a day (BID) | RESPIRATORY_TRACT | 3 refills | Status: DC

## 2017-12-22 MED ORDER — GABAPENTIN 100 MG PO CAPS: 100.0000 mg | ORAL_CAPSULE | Freq: Three times a day (TID) | ORAL | 3 refills | Status: DC

## 2017-12-22 MED ORDER — PRAVASTATIN SODIUM 40 MG PO TABS: 40.0000 mg | ORAL_TABLET | Freq: Every day | ORAL | 1 refills | Status: DC

## 2017-12-22 MED ORDER — LOSARTAN POTASSIUM-HCTZ 100-12.5 MG PO TABS: 1.0000 | ORAL_TABLET | Freq: Every day | ORAL | 3 refills | Status: DC

## 2017-12-22 NOTE — Telephone Encounter (Signed)
Pt has an appt today. We can refill this med then

## 2017-12-22 NOTE — Progress Notes (Signed)
Subjective:    Patient ID: Sandra Lawrence, female    DOB: 08-02-1945, 72 y.o.   MRN: [id number]  Sandra Lawrence is a 72 y.o. female who presents for follow-up of Type 2 diabetes mellitus.  Patient no checking home blood sugars.  She states that she cannot get a good blood sugar reading but has not followed up with [place] or the pharmacy to remedy the situation.  She also states that due to finances she cannot eat a nutritionally good diet. Home blood sugar records: none How often is blood sugars being checked: never Current symptoms/problems include none and have been unchanged. Daily foot checks: yes   Any foot concerns: no Last eye exam: over  Two years she does have a history of cataracts and does need follow-up with ophthalmology.  Encouraged her to call Dr. Katy Fitch Exercise: walking She does have underlying asthma and allergies but these seem to be under good control with her present medication regimen.  She also has OSA but apparently does not need CPAP at the present time.  She said previous right TKR but continues to complain of arthritic type pain.  She was scheduled for colonoscopy but this needs to be rescheduled and she plans to do that.  She has not had any chest pain, shortness of breath or PND.  She continues on gabapentin for her low back pain and states that this does help.  Continues to have difficulty with OAB.  She was given Myrbetriq in the past but cost was an issue.  She is using a pad on a daily basis and does leak regularly.   The following portions of the patient's history were reviewed and updated as appropriate: allergies, current medications, past medical history, past social history and problem list.  ROS as in subjective above.     Objective:    Physical Exam Alert and in no distress. Tympanic membranes and canals are normal. Pharyngeal area is normal. Neck is supple without adenopathy or thyromegaly. Cardiac exam shows a regular sinus rhythm without murmurs or  gallops. Lungs are clear to auscultation.  Lab Review Diabetic Labs Latest Ref Rng & Units 11/04/2016 11/05/2015 07/21/2015 12/31/2014 06/12/2014  HbA1c - 7.1 - 6.3 5.9 -  Microalbumin mg/L 50.1 - - 20.1 -  Micro/Creat Ratio - 32.0 - - 16.7 -  Chol <200 mg/dL 168 - 148 - -  HDL >50 mg/dL 43(L) - 45(L) - -  Calc LDL <100 mg/dL 105(H) - 87 - -  Triglycerides <150 mg/dL 102 - 81 - -  Creatinine 0.50 - 0.99 mg/dL 0.61 0.56 0.55 - 0.80   BP/Weight 11/03/2017 10/20/2017 09/17/2017 7/82/9562 05/20/863  Systolic BP 784 - 696 295 -  Diastolic BP 90 - 70 98 -  Wt. (Lbs) 295 294.8 290 288 286  BMI 46.2 46.17 45.42 45.11 44.79   Foot/eye exam completion dates Latest Ref Rng & Units 11/04/2016 07/12/2016  Eye Exam No Retinopathy - No Retinopathy  Foot Form Completion - Done -   A1c is 7.4 Sandra Lawrence  reports that she quit smoking about 38 years ago. Her smoking use included cigarettes. She has never used smokeless tobacco. She reports that she drinks alcohol. She reports that she does not use drugs.     Assessment & Plan:    Diabetes mellitus type 2, noninsulin dependent ([place]) - Plan: CBC with Differential/Platelet, Comprehensive metabolic panel, Lipid panel, POCT UA - Microalbumin  Mild intermittent asthma without complication - Plan: albuterol (PROAIR HFA) 108 (90 Base)  MCG/ACT inhaler, beclomethasone (QVAR) 40 MCG/ACT inhaler  Hypertensive heart disease without CHF - Plan: CBC with Differential/Platelet, Comprehensive metabolic panel, metoprolol tartrate (LOPRESSOR) 25 MG tablet, losartan-hydrochlorothiazide (HYZAAR) 100-12.5 MG tablet  Mild intermittent asthma with acute exacerbation  OSA (obstructive sleep apnea)  Morbid obesity with BMI of 40.0-44.9, adult ([place]) - Plan: CBC with Differential/Platelet, Comprehensive metabolic panel, Lipid panel  Sleep apnea, unspecified type  DDD (degenerative disc disease), lumbar  Cataract associated with type 2 diabetes mellitus ([place])  Coronary artery disease  due to lipid rich plaque - Plan: CBC with Differential/Platelet, Comprehensive metabolic panel, Lipid panel  Bilateral low back pain without sciatica, unspecified chronicity - Plan: gabapentin (NEURONTIN) 100 MG capsule  Arthritis  History of colonic polyps  Hyperlipidemia associated with type 2 diabetes mellitus ([facility]) - Plan: Lipid panel, pravastatin (PRAVACHOL) 40 MG tablet  OAB (overactive bladder) - Plan: tolterodine (DETROL) 1 MG tablet  Status post right knee replacement  Allergic rhinitis, unspecified seasonality, unspecified trigger - Plan: fexofenadine (ALLEGRA) 180 MG tablet    1. Rx changes: Detrol 2. Education: Reviewed 'ABCs' of diabetes management (respective goals in parentheses):  A1C (<7), blood pressure (<130/80), and cholesterol (LDL <100). 3. Compliance at present is estimated to be poor. Efforts to improve compliance (if necessary) will be directed at increased exercise.  Also discussed dietary consultation however she states finances as an issue. 4. Follow up: 4 months Overall she has been very noncompliant with her care.  [place] to come in every 4 months.  Discussed diet and potentially referring her to the patient however is reluctant to do this.  She is to schedule to see Dr. Katy Fitch and get a repeat colonoscopy.  I will try her on Detrol.. Over 45 minutes, greater than 50% spent in counseling information of care.

## 2017-12-23 LAB — CBC WITH DIFFERENTIAL/PLATELET
BASOS ABS: 0 10*3/uL (ref 0.0–0.2)
Basos: 1 %
EOS (ABSOLUTE): 0.2 10*3/uL (ref 0.0–0.4)
EOS: 5 %
Hematocrit: 38 % (ref 34.0–46.6)
Hemoglobin: 12.2 g/dL (ref 11.1–15.9)
IMMATURE GRANULOCYTES: 0 %
Immature Grans (Abs): 0 10*3/uL (ref 0.0–0.1)
LYMPHS ABS: 1.5 10*3/uL (ref 0.7–3.1)
LYMPHS: 32 %
MCH: 28.4 pg (ref 26.6–33.0)
MCHC: 32.1 g/dL (ref 31.5–35.7)
MCV: 89 fL (ref 79–97)
MONOCYTES: 9 %
Monocytes Absolute: 0.4 10*3/uL (ref 0.1–0.9)
NEUTROS PCT: 53 %
Neutrophils Absolute: 2.5 10*3/uL (ref 1.4–7.0)
Platelets: 224 10*3/uL (ref 150–450)
RBC: 4.29 x10E6/uL (ref 3.77–5.28)
RDW: 14.7 % (ref 12.3–15.4)
WBC: 4.8 10*3/uL (ref 3.4–10.8)

## 2017-12-23 LAB — COMPREHENSIVE METABOLIC PANEL
ALT: 11 IU/L (ref 0–32)
AST: 12 IU/L (ref 0–40)
Albumin/Globulin Ratio: 1.3 (ref 1.2–2.2)
Albumin: 3.9 g/dL (ref 3.6–4.8)
Alkaline Phosphatase: 107 IU/L (ref 39–117)
BUN/Creatinine Ratio: 19 (ref 12–28)
BUN: 11 mg/dL (ref 8–27)
Bilirubin Total: 0.4 mg/dL (ref 0.0–1.2)
CALCIUM: 9 mg/dL (ref 8.7–10.3)
CO2: 23 mmol/L (ref 20–29)
CREATININE: 0.59 mg/dL (ref 0.57–1.00)
Chloride: 105 mmol/L (ref 96–106)
GFR calc Af Amer: 108 mL/min/{1.73_m2} (ref >59)
GFR, EST NON AFRICAN AMERICAN: 94 mL/min/{1.73_m2} (ref >59)
GLUCOSE: 125 mg/dL — AB (ref 65–99)
Globulin, Total: 3.1 g/dL (ref 1.5–4.5)
Potassium: 4.2 mmol/L (ref 3.5–5.2)
Sodium: 143 mmol/L (ref 134–144)
Total Protein: 7 g/dL (ref 6.0–8.5)

## 2017-12-23 LAB — LIPID PANEL
CHOL/HDL RATIO: 3.7 ratio (ref 0.0–4.4)
Cholesterol, Total: 190 mg/dL (ref 100–199)
HDL: 51 mg/dL (ref >39)
LDL Calculated: 127 mg/dL — ABNORMAL HIGH (ref 0–99)
TRIGLYCERIDES: 58 mg/dL (ref 0–149)
VLDL CHOLESTEROL CAL: 12 mg/dL (ref 5–40)

## 2017-12-28 ENCOUNTER — Telehealth: Payer: Self-pay | Admitting: Family Medicine

## 2017-12-28 MED ORDER — FLUTICASONE PROPIONATE HFA 44 MCG/ACT IN AERO: 2.0000 | INHALATION_SPRAY | Freq: Two times a day (BID) | RESPIRATORY_TRACT | 3 refills | Status: DC

## 2017-12-28 NOTE — Telephone Encounter (Signed)
Recv'd fax from [facility] stating Qvar 74mcg discontinued by manufacture alternatives are Flovent or Arnuity Do you want to switch?

## 2018-01-07 ENCOUNTER — Other Ambulatory Visit: Payer: Self-pay | Admitting: Family Medicine

## 2018-01-07 DIAGNOSIS — I119 Hypertensive heart disease without heart failure: Secondary | ICD-10-CM

## 2018-04-07 ENCOUNTER — Other Ambulatory Visit: Payer: Self-pay | Admitting: Family Medicine

## 2018-04-07 DIAGNOSIS — N3281 Overactive bladder: Secondary | ICD-10-CM

## 2018-04-14 ENCOUNTER — Other Ambulatory Visit: Payer: Self-pay

## 2018-04-14 NOTE — Patient Outreach (Signed)
[facility]) Care Management  04/14/2018  PEPPER KERRICK [birth year] [id number]   Medication Adherence call to [facility] she answer but wants a call back patient is due on Losartan/HCTZ 100/25 mg and Pravastatin 40 mg. Mrs. Lumbert is showing past due under [place].   [facility] Management Direct Dial [phone]  Fax [phone] [email or web address]

## 2018-04-30 ENCOUNTER — Other Ambulatory Visit: Payer: Self-pay | Admitting: Family Medicine

## 2018-04-30 DIAGNOSIS — I119 Hypertensive heart disease without heart failure: Secondary | ICD-10-CM

## 2018-05-01 ENCOUNTER — Ambulatory Visit: Payer: Medicare Other | Admitting: Family Medicine

## 2018-05-22 ENCOUNTER — Encounter: Payer: Self-pay | Admitting: Family Medicine

## 2018-05-31 ENCOUNTER — Telehealth: Payer: Self-pay | Admitting: Family Medicine

## 2018-05-31 ENCOUNTER — Encounter: Payer: Self-pay | Admitting: Family Medicine

## 2018-05-31 NOTE — Telephone Encounter (Signed)
Write her a littet

## 2018-05-31 NOTE — Telephone Encounter (Signed)
  Pt called requesting letter stating that she has asthma. She states her apartment complex sprays insecticides and it always causes her asthma to flare up  If she has letter that she has asthma, they will not spray in her apartment  Please call when ready

## 2018-06-01 ENCOUNTER — Encounter: Payer: Self-pay | Admitting: Family Medicine

## 2018-06-01 NOTE — Telephone Encounter (Signed)
Letter typed & pt informed

## 2018-08-09 ENCOUNTER — Other Ambulatory Visit: Payer: Self-pay | Admitting: Family Medicine

## 2018-08-09 DIAGNOSIS — N3281 Overactive bladder: Secondary | ICD-10-CM

## 2018-09-11 ENCOUNTER — Other Ambulatory Visit: Payer: Self-pay | Admitting: Family Medicine

## 2018-09-11 DIAGNOSIS — Z1231 Encounter for screening mammogram for malignant neoplasm of breast: Secondary | ICD-10-CM

## 2018-11-09 ENCOUNTER — Other Ambulatory Visit: Payer: Self-pay | Admitting: Family Medicine

## 2018-11-09 DIAGNOSIS — J4520 Mild intermittent asthma, uncomplicated: Secondary | ICD-10-CM

## 2018-11-10 ENCOUNTER — Ambulatory Visit
Admission: RE | Admit: 2018-11-10 | Discharge: 2018-11-10 | Disposition: A | Discharge status: Home / Self Care | Payer: Medicare Other | Source: Ambulatory Visit | Attending: Family Medicine | Admitting: Family Medicine

## 2018-11-10 ENCOUNTER — Other Ambulatory Visit: Payer: Self-pay

## 2018-11-10 DIAGNOSIS — Z1231 Encounter for screening mammogram for malignant neoplasm of breast: Secondary | ICD-10-CM

## 2018-11-10 NOTE — Telephone Encounter (Signed)
Is this okay to refill? 

## 2018-11-11 ENCOUNTER — Other Ambulatory Visit: Payer: Self-pay | Admitting: Family Medicine

## 2018-11-11 DIAGNOSIS — N3281 Overactive bladder: Secondary | ICD-10-CM

## 2018-11-13 NOTE — Telephone Encounter (Signed)
[facility] to [facility] to[facility]od[facility]. Please advise [facility]

## 2018-11-15 ENCOUNTER — Other Ambulatory Visit: Payer: Self-pay

## 2018-11-15 DIAGNOSIS — I2510 Atherosclerotic heart disease of native coronary artery without angina pectoris: Secondary | ICD-10-CM

## 2018-11-15 MED ORDER — NITROGLYCERIN 0.4 MG SL SUBL: 0.4000 mg | SUBLINGUAL_TABLET | SUBLINGUAL | 0 refills | Status: DC | PRN

## 2018-11-15 NOTE — Telephone Encounter (Signed)
[facility] has sent a fax requesting a refill on the pending medication.

## 2018-12-30 ENCOUNTER — Other Ambulatory Visit: Payer: Self-pay | Admitting: Family Medicine

## 2018-12-30 DIAGNOSIS — E1169 Type 2 diabetes mellitus with other specified complication: Secondary | ICD-10-CM

## 2019-01-15 ENCOUNTER — Other Ambulatory Visit: Payer: Self-pay | Admitting: Family Medicine

## 2019-01-15 DIAGNOSIS — I119 Hypertensive heart disease without heart failure: Secondary | ICD-10-CM

## 2019-01-15 DIAGNOSIS — E1169 Type 2 diabetes mellitus with other specified complication: Secondary | ICD-10-CM

## 2019-01-15 DIAGNOSIS — E785 Hyperlipidemia, unspecified: Secondary | ICD-10-CM

## 2019-02-17 ENCOUNTER — Other Ambulatory Visit: Payer: Self-pay

## 2019-02-17 ENCOUNTER — Encounter (HOSPITAL_COMMUNITY): Payer: Self-pay | Admitting: Emergency Medicine

## 2019-02-17 ENCOUNTER — Ambulatory Visit (HOSPITAL_COMMUNITY)
Admission: EM | Admit: 2019-02-17 | Discharge: 2019-02-17 | Disposition: A | Discharge status: Home / Self Care | Payer: Medicare Other | Attending: Family Medicine | Admitting: Family Medicine

## 2019-02-17 DIAGNOSIS — R600 Localized edema: Secondary | ICD-10-CM | POA: Diagnosis not present
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
## 2019-02-17 NOTE — ED Triage Notes (Signed)
Pt sts bilateral foot and lower leg swelling x 2 weeks

## 2019-02-17 NOTE — Discharge Instructions (Signed)
For the swelling in your lower extremities, be sure to elevate your legs when able, mind the salt intake, stay physically active and consider wearing compression stockings.  Take the prescription to a medical supply company (google this in your area) and call them before going to see if they carry them.   If shortness of breath occurs, seek care.

## 2019-02-19 ENCOUNTER — Encounter: Payer: Self-pay | Admitting: Family Medicine

## 2019-02-21 ENCOUNTER — Other Ambulatory Visit: Payer: Self-pay

## 2019-02-21 ENCOUNTER — Ambulatory Visit
Admission: RE | Admit: 2019-02-21 | Discharge: 2019-02-21 | Disposition: A | Discharge status: Home / Self Care | Payer: Medicare Other | Source: Ambulatory Visit | Attending: Family Medicine | Admitting: Family Medicine

## 2019-02-21 ENCOUNTER — Ambulatory Visit (INDEPENDENT_AMBULATORY_CARE_PROVIDER_SITE_OTHER): Payer: Medicare Other | Admitting: Family Medicine

## 2019-02-21 ENCOUNTER — Encounter: Payer: Self-pay | Admitting: Family Medicine

## 2019-02-21 VITALS — BP 158/84 | HR 79 | Temp 97.5°F | Wt 305.8 lb

## 2019-02-21 DIAGNOSIS — I25119 Atherosclerotic heart disease of native coronary artery with unspecified angina pectoris: Secondary | ICD-10-CM

## 2019-02-21 DIAGNOSIS — Z6841 Body Mass Index (BMI) 40.0 and over, adult: Secondary | ICD-10-CM

## 2019-02-21 DIAGNOSIS — E119 Type 2 diabetes mellitus without complications: Secondary | ICD-10-CM | POA: Diagnosis not present

## 2019-02-21 DIAGNOSIS — T383X5A Adverse effect of insulin and oral hypoglycemic [antidiabetic] drugs, initial encounter: Secondary | ICD-10-CM

## 2019-02-21 DIAGNOSIS — E1169 Type 2 diabetes mellitus with other specified complication: Secondary | ICD-10-CM

## 2019-02-21 DIAGNOSIS — G4730 Sleep apnea, unspecified: Secondary | ICD-10-CM

## 2019-02-21 DIAGNOSIS — I119 Hypertensive heart disease without heart failure: Secondary | ICD-10-CM

## 2019-02-21 DIAGNOSIS — Z23 Encounter for immunization: Secondary | ICD-10-CM

## 2019-02-21 DIAGNOSIS — E785 Hyperlipidemia, unspecified: Secondary | ICD-10-CM

## 2019-02-21 DIAGNOSIS — J4520 Mild intermittent asthma, uncomplicated: Secondary | ICD-10-CM

## 2019-02-21 DIAGNOSIS — G4733 Obstructive sleep apnea (adult) (pediatric): Secondary | ICD-10-CM

## 2019-02-21 LAB — POCT GLYCOSYLATED HEMOGLOBIN (HGB A1C): Hemoglobin A1C: 8.6 % — AB (ref 4.0–5.6)

## 2019-02-21 MED ORDER — FARXIGA 5 MG PO TABS
5.0000 mg | ORAL_TABLET | Freq: Every day | ORAL | 5 refills | Status: DC
Start: 2019-02-21 — End: 2019-06-22

## 2019-02-21 NOTE — Progress Notes (Signed)
   Subjective:    Patient ID: Sandra Lawrence, female    DOB: 02/24/1946, 73 y.o.   MRN: [id number]  HPI She came here as an acute visit for evaluation of a 2-week history of bilateral leg swelling.  She states that over the last several weeks she has been sleeping in a chair with her feet dangling.  Apparently there was something wrong with her bed.  She also has a 2-week history of dyspnea on exertion with occasional chest pain.  No weakness or diaphoresis.  She has seen cardiology in the past and does have a history of CAD.  She does have an underlying history of asthma and is using Flovent.  She also has allergies.  She has a history of OSA but has not been using her CPAP.  Apparently there was some confusion as to her thinking she could stop it.  She also has an underlying history of diabetes however has not followed up here in quite some time.  She does have intolerance to metformin.   Review of Systems     Objective:   Physical Exam Alert and in no distress. Tympanic membranes and canals are normal. Pharyngeal area is normal. Neck is supple without adenopathy or thyromegaly. Cardiac exam shows a regular sinus rhythm without murmurs or gallops. Lungs are clear to auscultation. 3+ pitting edema is noted. EKG shows no acute changes and essentially no change from previous reading. Hemoglobin A1c is 8.6      Assessment & Plan:  Coronary artery disease involving native coronary artery of native heart with angina pectoris ([place]), Chronic - Plan: Brain natriuretic peptide, EKG 12-Lead, CBC with Differential/Platelet, Comprehensive metabolic panel, Lipid panel, Ambulatory referral to Cardiology, DG Chest 2 View  Hypertensive heart disease without CHF - Plan: CBC with Differential/Platelet, Comprehensive metabolic panel, DG Chest 2 View  Diabetes mellitus type 2, noninsulin dependent ([place]) - Plan: CBC with Differential/Platelet, Comprehensive metabolic panel, Lipid panel, dapagliflozin  propanediol (FARXIGA) 5 MG TABS tablet, POCT glycosylated hemoglobin (Hb A1C), CANCELED: HgB A1c     Since she cannot tolerate metformin, I will start her on Farxiga.  Explained that this could be expensive. Morbid obesity with BMI of 40.0-44.9, adult ([place])  Hyperlipidemia associated with type 2 diabetes mellitus ([place]) - Plan: Lipid panel  Sleep apnea, unspecified type: She is to start using her CPAP again.  Need for influenza vaccination - Plan: Flu Vaccine QUAD High Dose(Fluad)  Adverse effect of metformin, initial encounter  Mild intermittent asthma without complication: Continue on inhalers.  OSA (obstructive sleep apnea)  Recheck here in 1 month.  Over 45 minutes spent, greater than 50% in coordination of care

## 2019-02-22 LAB — COMPREHENSIVE METABOLIC PANEL
ALT: 9 IU/L (ref 0–32)
AST: 13 IU/L (ref 0–40)
Albumin/Globulin Ratio: 1.2 (ref 1.2–2.2)
Albumin: 3.6 g/dL — ABNORMAL LOW (ref 3.8–4.8)
Alkaline Phosphatase: 118 IU/L — ABNORMAL HIGH (ref 39–117)
BUN/Creatinine Ratio: 13 (ref 12–28)
BUN: 10 mg/dL (ref 8–27)
Bilirubin Total: 0.4 mg/dL (ref 0.0–1.2)
CO2: 24 mmol/L (ref 20–29)
Calcium: 9.3 mg/dL (ref 8.7–10.3)
Chloride: 104 mmol/L (ref 96–106)
Creatinine, Ser: 0.8 mg/dL (ref 0.57–1.00)
GFR calc Af Amer: 86 mL/min/{1.73_m2} (ref >59)
GFR calc non Af Amer: 75 mL/min/{1.73_m2} (ref >59)
Globulin, Total: 3.1 g/dL (ref 1.5–4.5)
Glucose: 151 mg/dL — ABNORMAL HIGH (ref 65–99)
Potassium: 4.2 mmol/L (ref 3.5–5.2)
Sodium: 139 mmol/L (ref 134–144)
Total Protein: 6.7 g/dL (ref 6.0–8.5)

## 2019-02-22 LAB — CBC WITH DIFFERENTIAL/PLATELET
Basophils Absolute: 0.1 10*3/uL (ref 0.0–0.2)
Basos: 1 %
EOS (ABSOLUTE): 0.2 10*3/uL (ref 0.0–0.4)
Eos: 4 %
Hematocrit: 38.2 % (ref 34.0–46.6)
Hemoglobin: 12.2 g/dL (ref 11.1–15.9)
Immature Grans (Abs): 0 10*3/uL (ref 0.0–0.1)
Immature Granulocytes: 0 %
Lymphocytes Absolute: 1.6 10*3/uL (ref 0.7–3.1)
Lymphs: 31 %
MCH: 28.3 pg (ref 26.6–33.0)
MCHC: 31.9 g/dL (ref 31.5–35.7)
MCV: 89 fL (ref 79–97)
Monocytes Absolute: 0.4 10*3/uL (ref 0.1–0.9)
Monocytes: 8 %
Neutrophils Absolute: 2.8 10*3/uL (ref 1.4–7.0)
Neutrophils: 56 %
Platelets: 243 10*3/uL (ref 150–450)
RBC: 4.31 x10E6/uL (ref 3.77–5.28)
RDW: 13.6 % (ref 11.7–15.4)
WBC: 5.1 10*3/uL (ref 3.4–10.8)

## 2019-02-22 LAB — LIPID PANEL
Chol/HDL Ratio: 3.8 ratio (ref 0.0–4.4)
Cholesterol, Total: 192 mg/dL (ref 100–199)
HDL: 51 mg/dL (ref >39)
LDL Chol Calc (NIH): 128 mg/dL — ABNORMAL HIGH (ref 0–99)
Triglycerides: 71 mg/dL (ref 0–149)
VLDL Cholesterol Cal: 13 mg/dL (ref 5–40)

## 2019-02-22 LAB — BRAIN NATRIURETIC PEPTIDE: BNP: 37.2 pg/mL (ref 0.0–100.0)

## 2019-02-24 ENCOUNTER — Telehealth: Payer: Self-pay | Admitting: Family Medicine

## 2019-02-24 NOTE — Telephone Encounter (Signed)
P.A. FARXIGA 

## 2019-02-24 NOTE — Telephone Encounter (Signed)
Approved til 05/16/20. Pt informed

## 2019-02-27 ENCOUNTER — Telehealth: Payer: Self-pay

## 2019-02-27 NOTE — Telephone Encounter (Signed)
[facility] called to advise that pt was non compliment and will need to have new sleep study to get Cpap machine restarted . Please advise [facility]

## 2019-02-28 NOTE — Telephone Encounter (Signed)
Set it up 

## 2019-03-01 ENCOUNTER — Other Ambulatory Visit: Payer: Self-pay

## 2019-03-01 DIAGNOSIS — G4730 Sleep apnea, unspecified: Secondary | ICD-10-CM

## 2019-03-01 NOTE — Telephone Encounter (Signed)
Order was palced. [place]

## 2019-03-05 ENCOUNTER — Telehealth: Payer: Self-pay

## 2019-03-05 NOTE — Telephone Encounter (Signed)
ok 

## 2019-03-05 NOTE — Telephone Encounter (Signed)
Pt wanted to advise she feels better taken farxiga at [place]. Please advise if this is ok. [place]

## 2019-03-05 NOTE — Telephone Encounter (Signed)
Pt was advised [facility] 

## 2019-03-12 ENCOUNTER — Other Ambulatory Visit: Payer: Self-pay

## 2019-03-12 ENCOUNTER — Ambulatory Visit: Payer: Medicare Other | Admitting: Cardiology

## 2019-03-12 ENCOUNTER — Ambulatory Visit (INDEPENDENT_AMBULATORY_CARE_PROVIDER_SITE_OTHER): Payer: Medicare Other | Admitting: Cardiology

## 2019-03-12 ENCOUNTER — Encounter: Payer: Self-pay | Admitting: Cardiology

## 2019-03-12 VITALS — BP 144/78 | HR 74 | Ht 67.0 in | Wt 294.1 lb

## 2019-03-12 DIAGNOSIS — R9431 Abnormal electrocardiogram [ECG] [EKG]: Secondary | ICD-10-CM

## 2019-03-12 DIAGNOSIS — R0789 Other chest pain: Secondary | ICD-10-CM | POA: Diagnosis not present

## 2019-03-12 DIAGNOSIS — E119 Type 2 diabetes mellitus without complications: Secondary | ICD-10-CM

## 2019-03-12 DIAGNOSIS — E785 Hyperlipidemia, unspecified: Secondary | ICD-10-CM

## 2019-03-12 DIAGNOSIS — R011 Cardiac murmur, unspecified: Secondary | ICD-10-CM

## 2019-03-12 DIAGNOSIS — G4730 Sleep apnea, unspecified: Secondary | ICD-10-CM

## 2019-03-12 DIAGNOSIS — E1169 Type 2 diabetes mellitus with other specified complication: Secondary | ICD-10-CM

## 2019-03-12 HISTORY — DX: Abnormal electrocardiogram (ECG) (EKG): R94.31

## 2019-03-12 MED ORDER — ASPIRIN EC 81 MG PO TBEC: 81.0000 mg | DELAYED_RELEASE_TABLET | Freq: Every day | ORAL | 3 refills | Status: DC

## 2019-03-12 NOTE — Patient Instructions (Addendum)
Medication Instructions:  Your physician has recommended you make the following change in your medication:   STOP aspirin 325 mg START aspirin 81 mg (1 tablet) once daily  If you need a refill on your cardiac medications before your next appointment, please call your pharmacy.   Lab work: NONE  If you have labs (blood work) drawn today and your tests are completely normal, you will receive your results only by: Marland Kitchen MyChart Message (if you have MyChart) OR . A paper copy in the mail If you have any lab test that is abnormal or we need to change your treatment, we will call you to review the results.  Testing/Procedures: You had an EKG performed today.  Your physician has requested that you have an echocardiogram. Echocardiography is a painless test that uses sound waves to create images of your heart. It provides your doctor with information about the size and shape of your heart and how well your heart's chambers and valves are working. This procedure takes approximately one hour. There are no restrictions for this procedure.  Your physician has requested that you have a lexiscan myoview. For further information please visit [email or web address]. Please follow instruction sheet, as given.    Follow-Up: At [facility], you and your health needs are our priority.  As part of our continuing mission to provide you with exceptional heart care, we have created designated Provider Care Teams.  These Care Teams include your primary Cardiologist (physician) and Advanced Practice Providers (APPs -  Physician Assistants and Nurse Practitioners) who all work together to provide you with the care you need, when you need it. You will need a follow up appointment in 3 months.   Any Other Special Instructions Will Be Listed Below  Regadenoson injection What is this medicine? REGADENOSON is used to test the heart for coronary artery disease. It is used in patients who can not exercise for their stress  test. This medicine may be used for other purposes; ask your health care provider or pharmacist if you have questions. COMMON BRAND NAME(S): Lexiscan What should I tell my health care provider before I take this medicine? They need to know if you have any of these conditions:  heart problems  lung or breathing disease, like asthma or COPD  an unusual or allergic reaction to regadenoson, other medicines, foods, dyes, or preservatives  pregnant or trying to get pregnant  breast-feeding How should I use this medicine? This medicine is for injection into a vein. It is given by a health care professional in a hospital or clinic setting. Talk to your pediatrician regarding the use of this medicine in children. Special care may be needed. Overdosage: If you think you have taken too much of this medicine contact a poison control center or emergency room at once. NOTE: This medicine is only for you. Do not share this medicine with others. What if I miss a dose? This does not apply. What may interact with this medicine?  caffeine  dipyridamole  guarana  theophylline This list may not describe all possible interactions. Give your health care provider a list of all the medicines, herbs, non-prescription drugs, or dietary supplements you use. Also tell them if you smoke, drink alcohol, or use illegal drugs. Some items may interact with your medicine. What should I watch for while using this medicine? Your condition will be monitored carefully while you are receiving this medicine. Do not take medicines, foods, or drinks with caffeine (like coffee, tea, or colas) for  at least 12 hours before your test. If you do not know if something contains caffeine, ask your health care professional. What side effects may I notice from receiving this medicine? Side effects that you should report to your doctor or health care professional as soon as possible:  allergic reactions like skin rash, itching or  hives, swelling of the face, lips, or tongue  breathing problems  chest pain, tightness or palpitations  severe headache Side effects that usually do not require medical attention (report to your doctor or health care professional if they continue or are bothersome):  flushing  headache  irritation or pain at site where injected  nausea, vomiting This list may not describe all possible side effects. Call your doctor for medical advice about side effects. You may report side effects to FDA at 1-800-FDA-1088. Where should I keep my medicine? This drug is given in a hospital or clinic and will not be stored at home. NOTE: This sheet is a summary. It may not cover all possible information. If you have questions about this medicine, talk to your doctor, pharmacist, or health care provider.  2020 Elsevier/Gold Standard (2008-01-01 15:08:13)  Cardiac Nuclear Scan A cardiac nuclear scan is a test that is done to check the flow of blood to your heart. It is done when you are resting and when you are exercising. The test looks for problems such as:  Not enough blood reaching a portion of the heart.  The heart muscle not working as it should. You may need this test if:  You have heart disease.  You have had lab results that are not normal.  You have had heart surgery or a balloon procedure to open up blocked arteries (angioplasty).  You have chest pain.  You have shortness of breath. In this test, a special dye (tracer) is put into your bloodstream. The tracer will travel to your heart. A camera will then take pictures of your heart to see how the tracer moves through your heart. This test is usually done at a hospital and takes 2-4 hours. Tell a doctor about:  Any allergies you have.  All medicines you are taking, including vitamins, herbs, eye drops, creams, and over-the-counter medicines.  Any problems you or family members have had with anesthetic medicines.  Any blood  disorders you have.  Any surgeries you have had.  Any medical conditions you have.  Whether you are pregnant or may be pregnant. What are the risks? Generally, this is a safe test. However, problems may occur, such as:  Serious chest pain and heart attack. This is only a risk if the stress portion of the test is done.  Rapid heartbeat.  A feeling of warmth in your chest. This feeling usually does not last long.  Allergic reaction to the tracer. What happens before the test?  Ask your doctor about changing or stopping your normal medicines. This is important.  Follow instructions from your doctor about what you cannot eat or drink.  Remove your jewelry on the day of the test. What happens during the test?  An IV tube will be inserted into one of your veins.  Your doctor will give you a small amount of tracer through the IV tube.  You will wait for 20-40 minutes while the tracer moves through your bloodstream.  Your heart will be monitored with an electrocardiogram (ECG).  You will lie down on an exam table.  Pictures of your heart will be taken for about 15-20  minutes.  You may also have a stress test. For this test, one of these things may be done: ? You will be asked to exercise on a treadmill or a stationary bike. ? You will be given medicines that will make your heart work harder. This is done if you are unable to exercise.  When blood flow to your heart has peaked, a tracer will again be given through the IV tube.  After 20-40 minutes, you will get back on the exam table. More pictures will be taken of your heart.  Depending on the tracer that is used, more pictures may need to be taken 3-4 hours later.  Your IV tube will be removed when the test is over. The test may vary among doctors and hospitals. What happens after the test?  Ask your doctor: ? Whether you can return to your normal schedule, including diet, activities, and medicines. ? Whether you should  drink more fluids. This will help to remove the tracer from your body. Drink enough fluid to keep your pee (urine) pale yellow.  Ask your doctor, or the department that is doing the test: ? When will my results be ready? ? How will I get my results? Summary  A cardiac nuclear scan is a test that is done to check the flow of blood to your heart.  Tell your doctor whether you are pregnant or may be pregnant.  Before the test, ask your doctor about changing or stopping your normal medicines. This is important.  Ask your doctor whether you can return to your normal activities. You may be asked to drink more fluids. This information is not intended to replace advice given to you by your health care provider. Make sure you discuss any questions you have with your health care provider. Document Released: 10/17/2017 Document Revised: 08/23/2018 Document Reviewed: 10/17/2017 Elsevier Patient Education  [place].  Echocardiogram An echocardiogram is a procedure that uses painless sound waves (ultrasound) to produce an image of the heart. Images from an echocardiogram can provide important information about:  Signs of coronary artery disease (CAD).  Aneurysm detection. An aneurysm is a weak or damaged part of an artery wall that bulges out from the normal force of blood pumping through the body.  Heart size and shape. Changes in the size or shape of the heart can be associated with certain conditions, including heart failure, aneurysm, and CAD.  Heart muscle function.  Heart valve function.  Signs of a past heart attack.  Fluid buildup around the heart.  Thickening of the heart muscle.  A tumor or infectious growth around the heart valves. Tell a health care provider about:  Any allergies you have.  All medicines you are taking, including vitamins, herbs, eye drops, creams, and over-the-counter medicines.  Any blood disorders you have.  Any surgeries you have had.  Any  medical conditions you have.  Whether you are pregnant or may be pregnant. What are the risks? Generally, this is a safe procedure. However, problems may occur, including:  Allergic reaction to dye (contrast) that may be used during the procedure. What happens before the procedure? No specific preparation is needed. You may eat and drink normally. What happens during the procedure?   An IV tube may be inserted into one of your veins.  You may receive contrast through this tube. A contrast is an injection that improves the quality of the pictures from your heart.  A gel will be applied to your chest.  A wand-like tool (transducer) will be moved over your chest. The gel will help to transmit the sound waves from the transducer.  The sound waves will harmlessly bounce off of your heart to allow the heart images to be captured in real-time motion. The images will be recorded on a computer. The procedure may vary among health care providers and hospitals. What happens after the procedure?  You may return to your normal, everyday life, including diet, activities, and medicines, unless your health care provider tells you not to do that. Summary  An echocardiogram is a procedure that uses painless sound waves (ultrasound) to produce an image of the heart.  Images from an echocardiogram can provide important information about the size and shape of your heart, heart muscle function, heart valve function, and fluid buildup around your heart.  You do not need to do anything to prepare before this procedure. You may eat and drink normally.  After the echocardiogram is completed, you may return to your normal, everyday life, unless your health care provider tells you not to do that. This information is not intended to replace advice given to you by your health care provider. Make sure you discuss any questions you have with your health care provider. Document Released: 04/30/2000 Document  Revised: 08/24/2018 Document Reviewed: 06/05/2016 Elsevier Patient Education  2020 [employer].
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
## 2019-03-14 ENCOUNTER — Other Ambulatory Visit: Payer: Self-pay

## 2019-03-14 ENCOUNTER — Telehealth (HOSPITAL_COMMUNITY): Payer: Self-pay | Admitting: *Deleted

## 2019-03-14 DIAGNOSIS — Z20822 Contact with and (suspected) exposure to covid-19: Secondary | ICD-10-CM

## 2019-03-14 NOTE — Telephone Encounter (Signed)
Left message on voicemail in reference to upcoming appointment scheduled for 03/21/2019. Phone number given for a call back so details instructions can be given. Sandra Lawrence, Sandra Lawrence No my chart available

## 2019-03-15 LAB — NOVEL CORONAVIRUS, NAA: SARS-CoV-2, NAA: NOT DETECTED

## 2019-03-16 NOTE — Telephone Encounter (Signed)
Follow up ° ° ° ° °Please return call to patient °

## 2019-03-21 ENCOUNTER — Ambulatory Visit (HOSPITAL_COMMUNITY): Payer: Medicare Other

## 2019-03-21 ENCOUNTER — Other Ambulatory Visit (HOSPITAL_COMMUNITY): Payer: Medicare Other

## 2019-03-22 ENCOUNTER — Ambulatory Visit (HOSPITAL_COMMUNITY): Payer: Medicare Other

## 2019-03-22 ENCOUNTER — Other Ambulatory Visit: Payer: Self-pay

## 2019-03-22 DIAGNOSIS — Z20822 Contact with and (suspected) exposure to covid-19: Secondary | ICD-10-CM

## 2019-03-23 LAB — NOVEL CORONAVIRUS, NAA: SARS-CoV-2, NAA: NOT DETECTED

## 2019-03-28 ENCOUNTER — Ambulatory Visit (INDEPENDENT_AMBULATORY_CARE_PROVIDER_SITE_OTHER): Payer: Medicare Other | Admitting: Family Medicine

## 2019-03-28 ENCOUNTER — Encounter: Payer: Self-pay | Admitting: Family Medicine

## 2019-03-28 ENCOUNTER — Other Ambulatory Visit: Payer: Self-pay

## 2019-03-28 VITALS — BP 146/82 | HR 73 | Temp 98.7°F | Wt 289.6 lb

## 2019-03-28 DIAGNOSIS — Z6841 Body Mass Index (BMI) 40.0 and over, adult: Secondary | ICD-10-CM

## 2019-03-28 DIAGNOSIS — G4730 Sleep apnea, unspecified: Secondary | ICD-10-CM | POA: Diagnosis not present

## 2019-03-28 DIAGNOSIS — M1990 Unspecified osteoarthritis, unspecified site: Secondary | ICD-10-CM | POA: Diagnosis not present

## 2019-03-28 DIAGNOSIS — M5136 Other intervertebral disc degeneration, lumbar region: Secondary | ICD-10-CM

## 2019-03-28 DIAGNOSIS — E119 Type 2 diabetes mellitus without complications: Secondary | ICD-10-CM

## 2019-03-28 NOTE — Progress Notes (Signed)
   Subjective:    Patient ID: Sandra Lawrence, female    DOB: 02-09-1946, 73 y.o.   MRN: [id number]  HPI She is here for follow-up visit.  She now has a sleep study set up for December 17 to follow-up on her OSA.  She is now taking [place] and states that she can tell an increase in her energy.  Is also helped her lose weight.  Review of the record indicates a least a 15 pound weight loss.  She continues to complain of hip and back pain.  Review of the record indicates a history of hip arthritis as well as  DDD in the lumbar area.  She did have one epidural injection which did not give her much relief and did not go back for more.  Review of the record indicates she has not had Shingrix.   Review of Systems     Objective:   Physical Exam Alert and in no distress.  Normal motion of the hip without pain.       Assessment & Plan:  Diabetes mellitus type 2, noninsulin dependent ([place]) : Continue on Farxiga since she seems to have a good response from this.  Sleep apnea, unspecified type: We will await sleep study to see how what that shows.  Morbid obesity with BMI of 40.0-44.9, adult [facility]): Complemented her on her weight loss and recommend she continue to do that.  Arthritis  DDD (degenerative disc disease), lumbar: Strongly encouraged her to go back to orthopedics to further evaluate her hip and back pain and possibly get another epidural injection.

## 2019-03-28 NOTE — Progress Notes (Deleted)
  Subjective:    Patient ID: Sandra Lawrence, female    DOB: [birth year], 73 y.o.   MRN: [id number]  Sandra Lawrence is a 73 y.o. female who presents for follow-up of Type 2 diabetes mellitus.  Home blood sugar records: {diabetes glucometry results:16657} Current symptoms/problems include {Symptoms; diabetes:14075} and have been {Desc; course:15616}. Daily foot checks:   Any foot concerns: *** Exercise: {types:19826} Diet: The following portions of the patient's history were reviewed and updated as appropriate: allergies, current medications, past medical history, past social history and problem list.  ROS as in subjective above.     Objective:    Physical Exam Alert and in no distress otherwise not examined.  There were no vitals taken for this visit.  Lab Review Diabetic Labs Latest Ref Rng & Units 02/21/2019 12/22/2017 11/04/2016 11/05/2015 07/21/2015  HbA1c 4.0 - 5.6 % 8.6(A) - 7.1 - 6.3  Microalbumin mg/L - - 50.1 - -  Micro/Creat Ratio - - - 32.0 - -  Chol 100 - 199 mg/dL 192 190 168 - 148  HDL >39 mg/dL 51 51 43(L) - 45(L)  Calc LDL 0 - 99 mg/dL 128(H) 127(H) 105(H) - 87  Triglycerides 0 - 149 mg/dL 71 58 102 - 81  Creatinine 0.57 - 1.00 mg/dL 0.80 0.59 0.61 0.56 0.55   BP/Weight 03/12/2019 02/21/2019 02/17/2019 12/22/2017 [id number]  Systolic BP 123456 0000000 [id number]  Diastolic BP 78 84 84 84 90  Wt. (Lbs) 294.12 305.8 - 297.4 295  BMI 46.07 47.9 - 46.58 46.2   Foot/eye exam completion dates Latest Ref Rng & Units 11/04/2016 07/12/2016  Eye Exam No Retinopathy - No Retinopathy  Foot Form Completion - Done -    Sandra Lawrence  reports that she quit smoking about 39 years ago. Her smoking use included cigarettes. She has never used smokeless tobacco. She reports current alcohol use. She reports that she does not use drugs.     Assessment & Plan:    No diagnosis found.  1. Rx changes: {none:33079} 2. Education: Reviewed 'ABCs' of diabetes management (respective goals in parentheses):   A1C (<7), blood pressure (<130/80), and cholesterol (LDL <100). 3. Compliance at present is estimated to be {good/fair/poor:33178}. Efforts to improve compliance (if necessary) will be directed at {compliance:16716}. 4. Follow up: {NUMBERS; 0-10:33138} {time:11}

## 2019-04-05 ENCOUNTER — Telehealth (HOSPITAL_COMMUNITY): Payer: Self-pay

## 2019-04-05 NOTE — Telephone Encounter (Signed)
Spoke with the patient, instructions given. She stated that she would be here for her test. Asked to call back with any questions. EMTP 

## 2019-04-10 ENCOUNTER — Other Ambulatory Visit: Payer: Self-pay

## 2019-04-10 ENCOUNTER — Ambulatory Visit (HOSPITAL_COMMUNITY): Payer: Medicare Other | Attending: Cardiology

## 2019-04-10 VITALS — Ht 67.0 in | Wt 289.0 lb

## 2019-04-10 DIAGNOSIS — R9431 Abnormal electrocardiogram [ECG] [EKG]: Secondary | ICD-10-CM | POA: Diagnosis present

## 2019-04-10 DIAGNOSIS — R0789 Other chest pain: Secondary | ICD-10-CM | POA: Insufficient documentation

## 2019-04-10 DIAGNOSIS — E1169 Type 2 diabetes mellitus with other specified complication: Secondary | ICD-10-CM | POA: Diagnosis not present

## 2019-04-10 DIAGNOSIS — E785 Hyperlipidemia, unspecified: Secondary | ICD-10-CM | POA: Diagnosis present

## 2019-04-10 DIAGNOSIS — E119 Type 2 diabetes mellitus without complications: Secondary | ICD-10-CM | POA: Insufficient documentation

## 2019-04-10 MED ORDER — TECHNETIUM TC 99M TETROFOSMIN IV KIT
32.6000 | PACK | Freq: Once | INTRAVENOUS | Status: AC | PRN
  Administered 2019-04-10: 32.6 via INTRAVENOUS
  Filled 2019-04-10: qty 33

## 2019-04-10 MED ORDER — REGADENOSON 0.4 MG/5ML IV SOLN
0.4000 mg | Freq: Once | INTRAVENOUS | Status: AC
  Administered 2019-04-10: 0.4 mg via INTRAVENOUS

## 2019-04-11 ENCOUNTER — Ambulatory Visit (HOSPITAL_COMMUNITY): Payer: Medicare Other | Attending: Cardiovascular Disease

## 2019-04-11 LAB — MYOCARDIAL PERFUSION IMAGING
LV dias vol: 90 mL (ref 46–106)
LV sys vol: 38 mL
Peak HR: 89 {beats}/min
Rest HR: 67 {beats}/min
SDS: 0
SRS: 0
SSS: 0
TID: 1.08

## 2019-04-11 MED ORDER — TECHNETIUM TC 99M TETROFOSMIN IV KIT
31.0000 | PACK | Freq: Once | INTRAVENOUS | Status: AC | PRN
  Administered 2019-04-11: 31 via INTRAVENOUS
  Filled 2019-04-11: qty 31

## 2019-04-24 ENCOUNTER — Telehealth: Payer: Self-pay | Admitting: Cardiology

## 2019-04-24 ENCOUNTER — Telehealth: Payer: Self-pay

## 2019-04-24 MED ORDER — FLUCONAZOLE 150 MG PO TABS: 150.0000 mg | ORAL_TABLET | Freq: Once | ORAL | 0 refills | Status: AC

## 2019-04-24 NOTE — Telephone Encounter (Signed)
I called Diflucan in.  Have her take 1 pill and the other one is to be taken later if she does not get full relief.

## 2019-04-24 NOTE — Telephone Encounter (Signed)
Pt. Called wanting to speak with you about a medication, I asked if she just needed a refill on something she said no wanted to speak with you about Dr. Redmond School calling her in a prescription, [phone].

## 2019-04-24 NOTE — Telephone Encounter (Signed)
Pt is on farxgia and is needing med to help with yeast infection. Please advise [facility]

## 2019-04-24 NOTE — Telephone Encounter (Signed)
-----   Message from Jenean Lindau, MD sent at 04/13/2019  9:48 AM EST ----- The results of the study is unremarkable. Please inform patient. I will discuss in detail at next appointment. Cc  primary care/referring physician Jenean Lindau, MD 04/13/2019 9:48 AM

## 2019-04-24 NOTE — Telephone Encounter (Signed)
Results relayed, copy sent to Dr. Redmond School.

## 2019-04-24 NOTE — Telephone Encounter (Signed)
Pt. Calling to get test rest results

## 2019-04-26 NOTE — Telephone Encounter (Signed)
Pt was advised [facility] 

## 2019-05-03 ENCOUNTER — Encounter (HOSPITAL_BASED_OUTPATIENT_CLINIC_OR_DEPARTMENT_OTHER): Payer: Medicare Other | Admitting: Internal Medicine

## 2019-05-05 ENCOUNTER — Other Ambulatory Visit: Payer: Self-pay | Admitting: Family Medicine

## 2019-05-05 DIAGNOSIS — I119 Hypertensive heart disease without heart failure: Secondary | ICD-10-CM

## 2019-05-21 ENCOUNTER — Telehealth: Payer: Self-pay

## 2019-05-21 MED ORDER — FLUCONAZOLE 150 MG PO TABS: 150.0000 mg | ORAL_TABLET | Freq: Once | ORAL | 2 refills | Status: AC

## 2019-05-21 NOTE — Telephone Encounter (Signed)
Pt. Called stating that she needs another refill diflucan the farxiga works good but gives her side affects of itching and vaginal burning and could she get more than just 1 this time or a refill with the one called in to [facility] on [address].

## 2019-06-06 ENCOUNTER — Other Ambulatory Visit: Payer: Self-pay | Admitting: Family Medicine

## 2019-06-06 DIAGNOSIS — I2510 Atherosclerotic heart disease of native coronary artery without angina pectoris: Secondary | ICD-10-CM

## 2019-06-18 ENCOUNTER — Telehealth: Payer: Self-pay

## 2019-06-18 NOTE — Telephone Encounter (Signed)
Pt called advising she can no longer deal with the side effects from the [place] . Pt says her bottom is itching really bad. Pt says jardiance causes the same thing . [place]

## 2019-06-18 NOTE — Telephone Encounter (Signed)
Go ahead and tell her to stop this medication and we might need to then switch to a GLP.  Have her set up for virtual visit concerning that.

## 2019-06-19 NOTE — Telephone Encounter (Signed)
Appt made for pt . Sandra Lawrence

## 2019-06-22 ENCOUNTER — Ambulatory Visit (INDEPENDENT_AMBULATORY_CARE_PROVIDER_SITE_OTHER): Payer: Medicare PPO | Admitting: Family Medicine

## 2019-06-22 ENCOUNTER — Encounter: Payer: Self-pay | Admitting: Family Medicine

## 2019-06-22 ENCOUNTER — Other Ambulatory Visit: Payer: Self-pay

## 2019-06-22 VITALS — Temp 97.0°F | Wt 293.0 lb

## 2019-06-22 DIAGNOSIS — E119 Type 2 diabetes mellitus without complications: Secondary | ICD-10-CM | POA: Diagnosis not present

## 2019-06-22 DIAGNOSIS — T383X5A Adverse effect of insulin and oral hypoglycemic [antidiabetic] drugs, initial encounter: Secondary | ICD-10-CM | POA: Diagnosis not present

## 2019-06-22 DIAGNOSIS — T50905A Adverse effect of unspecified drugs, medicaments and biological substances, initial encounter: Secondary | ICD-10-CM

## 2019-06-22 MED ORDER — TRULICITY 0.75 MG/0.5ML ~~LOC~~ SOAJ: 0.7500 mg | SUBCUTANEOUS | 5 refills | Status: DC

## 2019-06-22 NOTE — Progress Notes (Signed)
   Subjective:    Patient ID: Sandra Lawrence, female    DOB: Nov 20, 1945, 74 y.o.   MRN: [id number]  HPI Documentation for virtual telephone encounter. Documentation for virtual audio and video telecommunications through [place] encounter: The patient was located at home. The provider was located in the office. The patient did consent to this visit and is aware of possible charges through their insurance for this visit. The other persons participating in this telemedicine service were none. This virtual service is not related to other E/M service within previous 7 days. She has been using [place] but has been having vaginitis symptoms and the use of Diflucan is only cleared for 3 or 4 days.  She wants to stop this.  She has had previous difficulty with Metformin causing stomach irritation and is no longer on that medicine.   Review of Systems     Objective:   Physical Exam Alert and in no distress otherwise not examined       Assessment & Plan:  Adverse effect of drug, initial encounter  Adverse effect of metformin, initial encounter  Diabetes mellitus type 2, noninsulin dependent ([place]) - Plan: Dulaglutide (TRULICITY) [id number] SOPN I will call in the medication.  She is to bring the medication in so we can show her how to use this effectively and then follow-up within several months.

## 2019-06-26 ENCOUNTER — Other Ambulatory Visit: Payer: Self-pay

## 2019-06-26 ENCOUNTER — Other Ambulatory Visit: Payer: Medicare PPO

## 2019-06-26 NOTE — Progress Notes (Signed)
Pt was advised on how to use a trulicity pen. Sandra Lawrence

## 2019-06-29 ENCOUNTER — Ambulatory Visit (HOSPITAL_BASED_OUTPATIENT_CLINIC_OR_DEPARTMENT_OTHER): Payer: Medicare PPO | Attending: Family Medicine | Admitting: Internal Medicine

## 2019-06-29 ENCOUNTER — Other Ambulatory Visit: Payer: Self-pay

## 2019-06-29 DIAGNOSIS — G4733 Obstructive sleep apnea (adult) (pediatric): Secondary | ICD-10-CM | POA: Diagnosis not present

## 2019-06-29 DIAGNOSIS — G4730 Sleep apnea, unspecified: Secondary | ICD-10-CM | POA: Diagnosis present

## 2019-07-07 DIAGNOSIS — G4733 Obstructive sleep apnea (adult) (pediatric): Secondary | ICD-10-CM | POA: Diagnosis not present

## 2019-07-07 NOTE — Procedures (Signed)
    Patient Name: Sandra Lawrence, Sandra Lawrence Date: 07/01/2019 Gender: Female D.O.B: [birth year] Age (years): 66 Referring Provider: Denita Lung Height (inches): 40 Interpreting Physician: Baird Lyons MD, ABSM Weight (lbs): 283 RPSGT: Jonna Coup BMI: 44 MRN: [id number] Neck Size: 16.00  CLINICAL INFORMATION Sleep Study Type: HST Indication for sleep study: OSA Epworth Sleepiness Score: 10  Most recent polysomnogram dated 12/17/2016 revealed an AHI of 9.5/h and RDI of 9.5/h.  SLEEP STUDY TECHNIQUE A multi-channel overnight portable sleep study was performed. The channels recorded were: nasal airflow, thoracic respiratory movement, and oxygen saturation with a pulse oximetry. Snoring was also monitored.  MEDICATIONS Patient self administered medications include: none reported.  SLEEP ARCHITECTURE Patient was studied for 450.1 minutes. The sleep efficiency was 93.1 % and the patient was supine for 70.1%. The arousal index was 0.0 per hour.  RESPIRATORY PARAMETERS The overall AHI was 15.7 per hour, with a central apnea index of 0.0 per hour. The oxygen nadir was 94% during sleep.  CARDIAC DATA Mean heart rate during sleep was 68.7 bpm.  IMPRESSIONS - Moderate obstructive sleep apnea occurred during this study (AHI = 15.7/h). - No significant central sleep apnea occurred during this study (CAI = 0.0/h). - The patient had minimal or no oxygen desaturation during the study (Min O2 = 94%) - Patient snored.  DIAGNOSIS - Obstructive Sleep Apnea (327.23 [G47.33 ICD-10])  RECOMMENDATIONS - Suggest CPAP titration sleep study or autopap. Other options, including a fitted oral appliance or ENT evaluation, would be based on clinical judgment. - Be careful with alcohol, sedatives and other CNS depressants that may worsen sleep apnea and disrupt normal sleep architecture. - Sleep hygiene should be reviewed to assess factors that may improve sleep quality. - Weight management and  regular exercise should be initiated or continued.  [Electronically signed] 07/07/2019 10:48 AM  Baird Lyons MD, ABSM Diplomate, American Board of Sleep Medicine   NPI: [id number]                         [place], [place] [facility]  ELECTRONICALLY SIGNED ON:  07/07/2019, 10:45 AM [place] PH: [phone]   FX: [phone] [place]

## 2019-07-12 ENCOUNTER — Encounter: Payer: Self-pay | Admitting: Family Medicine

## 2019-07-12 ENCOUNTER — Ambulatory Visit (INDEPENDENT_AMBULATORY_CARE_PROVIDER_SITE_OTHER): Payer: Medicare PPO | Admitting: Family Medicine

## 2019-07-12 ENCOUNTER — Other Ambulatory Visit: Payer: Self-pay

## 2019-07-12 VITALS — Temp 96.1°F | Wt 293.0 lb

## 2019-07-12 DIAGNOSIS — G4730 Sleep apnea, unspecified: Secondary | ICD-10-CM

## 2019-07-12 DIAGNOSIS — M1990 Unspecified osteoarthritis, unspecified site: Secondary | ICD-10-CM | POA: Diagnosis not present

## 2019-07-12 DIAGNOSIS — Z6841 Body Mass Index (BMI) 40.0 and over, adult: Secondary | ICD-10-CM

## 2019-07-12 NOTE — Progress Notes (Signed)
   Subjective:    Patient ID: Sandra Lawrence, female    DOB: 03-26-1946, 74 y.o.   MRN: [id number]  HPI Documentation for virtual telephone encounter. Documentation for virtual audio and video telecommunications through [place] encounter: The patient was located at home. The provider was located in the office. The patient did consent to this visit and is aware of possible charges through their insurance for this visit. The other persons participating in this telemedicine service were none. This virtual service is not related to other E/M service within previous 7 days. I discussed the sleep study with her and told her she definitely has sleep apnea.  She is familiar with this and that she had a previous study which did show sleep apnea but not enough to treat.  Her AHI is 15.7.  She is familiar with the CPAP. Her exercise is quite limited due to back pain.  Review of Systems     Objective:   Physical Exam Alert and in no distress otherwise not examined       Assessment & Plan:  Sleep apnea, unspecified type  Morbid obesity with BMI of 40.0-44.9, adult ([place])  Arthritis Will send information to start her on CPAP auto titrate 5-15 with heated humidity.  She is then to reschedule a visit to evaluate efficacy of this.  I then discussed increasing her physical activity even 5-minute increments of walking to help with weight reduction.

## 2019-07-26 ENCOUNTER — Ambulatory Visit: Payer: Medicare Other | Admitting: Family Medicine

## 2019-08-07 DIAGNOSIS — G4733 Obstructive sleep apnea (adult) (pediatric): Secondary | ICD-10-CM | POA: Diagnosis not present

## 2019-08-13 ENCOUNTER — Ambulatory Visit: Payer: Self-pay | Admitting: Family Medicine

## 2019-08-13 NOTE — Progress Notes (Deleted)
  Subjective:    Patient ID: Sandra Lawrence, female    DOB: [birth year], 74 y.o.   MRN: [id number]  Sandra Lawrence is a 74 y.o. female who presents for follow-up of Type 2 diabetes mellitus.  Home blood sugar records: {diabetes glucometry results:16657} Current symptoms/problems include {Symptoms; diabetes:14075} and have been {Desc; course:15616}. Daily foot checks:   Any foot concerns: *** Exercise: {types:19826} Diet: The following portions of the patient's history were reviewed and updated as appropriate: allergies, current medications, past medical history, past social history and problem list.  ROS as in subjective above.     Objective:    Physical Exam Alert and in no distress otherwise not examined.  There were no vitals taken for this visit.  Lab Review Diabetic Labs Latest Ref Rng & Units 02/21/2019 12/22/2017 11/04/2016 11/05/2015 07/21/2015  HbA1c 4.0 - 5.6 % 8.6(A) - 7.1 - 6.3  Microalbumin mg/L - - 50.1 - -  Micro/Creat Ratio - - - 32.0 - -  Chol 100 - 199 mg/dL 192 190 168 - 148  HDL >39 mg/dL 51 51 43(L) - 45(L)  Calc LDL 0 - 99 mg/dL 128(H) 127(H) 105(H) - 87  Triglycerides 0 - 149 mg/dL 71 58 102 - 81  Creatinine 0.57 - 1.00 mg/dL 0.80 0.59 0.61 0.56 0.55   BP/Weight 07/12/2019 06/22/2019 03/28/2019 03/12/2019 [id number]  Systolic BP - - 123456 123456 0000000  Diastolic BP - - 82 78 84  Wt. (Lbs) 293 293 289.6 294.12 305.8  BMI 45.89 45.89 45.36 46.07 47.9   Foot/eye exam completion dates Latest Ref Rng & Units 11/04/2016 07/12/2016  Eye Exam No Retinopathy - No Retinopathy  Foot Form Completion - Done -    Sandra Lawrence  reports that she quit smoking about 39 years ago. Her smoking use included cigarettes. She has never used smokeless tobacco. She reports current alcohol use. She reports that she does not use drugs.     Assessment & Plan:    No diagnosis found.  1. Rx changes: {none:33079} 2. Education: Reviewed 'ABCs' of diabetes management (respective goals in parentheses):   A1C (<7), blood pressure (<130/80), and cholesterol (LDL <100). 3. Compliance at present is estimated to be {good/fair/poor:33178}. Efforts to improve compliance (if necessary) will be directed at {compliance:16716}. 4. Follow up: {NUMBERS; 0-10:33138} {time:11}

## 2019-08-16 ENCOUNTER — Other Ambulatory Visit: Payer: Self-pay | Admitting: Family Medicine

## 2019-08-16 DIAGNOSIS — E1169 Type 2 diabetes mellitus with other specified complication: Secondary | ICD-10-CM

## 2019-08-16 DIAGNOSIS — E785 Hyperlipidemia, unspecified: Secondary | ICD-10-CM

## 2019-08-20 ENCOUNTER — Other Ambulatory Visit: Payer: Self-pay

## 2019-08-20 ENCOUNTER — Encounter: Payer: Self-pay | Admitting: Family Medicine

## 2019-08-20 ENCOUNTER — Ambulatory Visit (INDEPENDENT_AMBULATORY_CARE_PROVIDER_SITE_OTHER): Payer: Medicare PPO | Admitting: Family Medicine

## 2019-08-20 VITALS — BP 158/90 | HR 80 | Temp 96.4°F | Wt 299.0 lb

## 2019-08-20 DIAGNOSIS — M5136 Other intervertebral disc degeneration, lumbar region: Secondary | ICD-10-CM | POA: Diagnosis not present

## 2019-08-20 DIAGNOSIS — N3281 Overactive bladder: Secondary | ICD-10-CM

## 2019-08-20 DIAGNOSIS — G4730 Sleep apnea, unspecified: Secondary | ICD-10-CM

## 2019-08-20 DIAGNOSIS — Z96651 Presence of right artificial knee joint: Secondary | ICD-10-CM

## 2019-08-20 DIAGNOSIS — I119 Hypertensive heart disease without heart failure: Secondary | ICD-10-CM | POA: Diagnosis not present

## 2019-08-20 DIAGNOSIS — M545 Low back pain: Secondary | ICD-10-CM

## 2019-08-20 DIAGNOSIS — G8929 Other chronic pain: Secondary | ICD-10-CM

## 2019-08-20 DIAGNOSIS — E1169 Type 2 diabetes mellitus with other specified complication: Secondary | ICD-10-CM

## 2019-08-20 DIAGNOSIS — Z6841 Body Mass Index (BMI) 40.0 and over, adult: Secondary | ICD-10-CM

## 2019-08-20 DIAGNOSIS — E119 Type 2 diabetes mellitus without complications: Secondary | ICD-10-CM | POA: Diagnosis not present

## 2019-08-20 DIAGNOSIS — E785 Hyperlipidemia, unspecified: Secondary | ICD-10-CM

## 2019-08-20 LAB — POCT GLYCOSYLATED HEMOGLOBIN (HGB A1C): Hemoglobin A1C: 7.9 % — AB (ref 4.0–5.6)

## 2019-08-20 LAB — POCT UA - MICROALBUMIN
Albumin/Creatinine Ratio, Urine, POC: 20.3
Creatinine, POC: 118.5 mg/dL
Microalbumin Ur, POC: 24.1 mg/L

## 2019-08-20 NOTE — Progress Notes (Signed)
Subjective:    Patient ID: Sandra Lawrence, female    DOB: 1945-07-29, 74 y.o.   MRN: [id number]  Sandra Lawrence is a 74 y.o. female who presents for follow-up of Type 2 diabetes mellitus.  Home blood sugar records: no record  Current symptoms/problems include none at this time. Daily foot checks: yes   Any foot concerns: thick toe nails Exercise: not at this time Diet: regular She continues to complain of low back pain. She has had a previous epidural injection without much benefit. She was given Neurontin and states that it did help but she stopped it thinking it was causing her to gain weight. She has also had bilateral TKR. She does have sleep apnea and is using CPAP and getting good results with that. She recently started back on CPAP. She does have OAB but has not been taking her Detrol thinking it might of caused her mouth to be dry but states that the CPAP seems to have taken care of that. She continues on pravastatin without muscle aches or pains. She is also taking Trulicity. She continues on losartan/HCTZ and metoprolol. She is supposed be on this twice per day but is only taking it once per day. The following portions of the patient's history were reviewed and updated as appropriate: allergies, current medications, past medical history, past social history and problem list.  ROS as in subjective above.     Objective:    Physical Exam Alert and in no distress otherwise not examined.  Hemoglobin A1c is 7.9  Lab Review Diabetic Labs Latest Ref Rng & Units 02/21/2019 12/22/2017 11/04/2016 11/05/2015 07/21/2015  HbA1c 4.0 - 5.6 % 8.6(A) - 7.1 - 6.3  Microalbumin mg/L - - 50.1 - -  Micro/Creat Ratio - - - 32.0 - -  Chol 100 - 199 mg/dL 192 190 168 - 148  HDL >39 mg/dL 51 51 43(L) - 45(L)  Calc LDL 0 - 99 mg/dL 128(H) 127(H) 105(H) - 87  Triglycerides 0 - 149 mg/dL 71 58 102 - 81  Creatinine 0.57 - 1.00 mg/dL 0.80 0.59 0.61 0.56 0.55   BP/Weight 07/12/2019 06/22/2019 03/28/2019  03/12/2019 [id number]  Systolic BP - - 123456 123456 0000000  Diastolic BP - - 82 78 84  Wt. (Lbs) 293 293 289.6 294.12 305.8  BMI 45.89 45.89 45.36 46.07 47.9   Foot/eye exam completion dates Latest Ref Rng & Units 11/04/2016 07/12/2016  Eye Exam No Retinopathy - No Retinopathy  Foot Form Completion - Done -    Sandra Lawrence  reports that she quit smoking about 39 years ago. Her smoking use included cigarettes. She has never used smokeless tobacco. She reports current alcohol use. She reports that she does not use drugs.     Assessment & Plan:    Diabetes mellitus type 2, noninsulin dependent ([place]) - Plan: POCT glycosylated hemoglobin (Hb A1C), POCT UA - Microalbumin  Sleep apnea, unspecified type  Status post right knee replacement  OAB (overactive bladder)  Morbid obesity with BMI of 40.0-44.9, adult (HCC)  Hypertensive heart disease without CHF  Hyperlipidemia associated with type 2 diabetes mellitus (HCC)  DDD (degenerative disc disease), lumbar  Chronic bilateral low back pain without sciatica  Continue on your losartan/HCTZ and take metoprolol twice per day Make sure you do some gentle stretching on a daily basis Take Tylenol for pain relief first which would be 2 pills 4 times per day as needed then use the Naprosyn if that is not enough Continue to walk as  much as you can hopefully each day that she walk you can work a little bit further and keep going Cut back on  "white food" Get back on Detrol Start back on the gabapentin and keep me informed if it causes weight gain  1.  2. Education: Reviewed 'ABCs' of diabetes management (respective goals in parentheses):  A1C (<7), blood pressure (<130/80), and cholesterol (LDL <100). 3. Compliance at present is estimated to be good. Efforts to improve compliance (if necessary) will be directed at increased exercise. 4. Follow up: 4 months  We will get a compliance readout on her when she has been on the CPAP for a longer period of time.

## 2019-08-20 NOTE — Patient Instructions (Addendum)
Continue on your losartan/HCTZ and take metoprolol twice per day Make sure you do some gentle stretching on a daily basis Take Tylenol for pain relief first which would be 2 pills 4 times per day as needed then use the Naprosyn if that is not enough Continue to walk as much as you can hopefully each day that she walk you can work a little bit further and keep going Cut back on  "white food" Get back on Detrol Start back on the gabapentin and keep me informed if it causes weight gain

## 2019-09-07 DIAGNOSIS — G4733 Obstructive sleep apnea (adult) (pediatric): Secondary | ICD-10-CM | POA: Diagnosis not present

## 2019-10-07 DIAGNOSIS — G4733 Obstructive sleep apnea (adult) (pediatric): Secondary | ICD-10-CM | POA: Diagnosis not present

## 2019-11-07 DIAGNOSIS — G4733 Obstructive sleep apnea (adult) (pediatric): Secondary | ICD-10-CM | POA: Diagnosis not present

## 2019-11-12 ENCOUNTER — Ambulatory Visit (HOSPITAL_COMMUNITY)
Admission: EM | Admit: 2019-11-12 | Discharge: 2019-11-12 | Disposition: A | Discharge status: Home / Self Care | Payer: Medicare PPO | Attending: Internal Medicine | Admitting: Internal Medicine

## 2019-11-12 ENCOUNTER — Encounter (HOSPITAL_COMMUNITY): Payer: Self-pay

## 2019-11-12 DIAGNOSIS — J209 Acute bronchitis, unspecified: Secondary | ICD-10-CM | POA: Diagnosis not present

## 2019-11-12 DIAGNOSIS — J4531 Mild persistent asthma with (acute) exacerbation: Secondary | ICD-10-CM | POA: Insufficient documentation

## 2019-11-12 DIAGNOSIS — Z20822 Contact with and (suspected) exposure to covid-19: Secondary | ICD-10-CM | POA: Insufficient documentation

## 2019-11-12 DIAGNOSIS — J45909 Unspecified asthma, uncomplicated: Secondary | ICD-10-CM | POA: Diagnosis not present

## 2019-11-12 MED ORDER — FLOVENT HFA 44 MCG/ACT IN AERO: 2.0000 | INHALATION_SPRAY | Freq: Two times a day (BID) | RESPIRATORY_TRACT | 3 refills | Status: DC

## 2019-11-12 MED ORDER — FLUTICASONE PROPIONATE 50 MCG/ACT NA SUSP: 1.0000 | Freq: Every day | NASAL | 0 refills | Status: DC

## 2019-11-12 MED ORDER — BENZONATATE 100 MG PO CAPS: 100.0000 mg | ORAL_CAPSULE | Freq: Three times a day (TID) | ORAL | 0 refills | Status: DC

## 2019-11-12 MED ORDER — PREDNISONE 20 MG PO TABS: 20.0000 mg | ORAL_TABLET | Freq: Every day | ORAL | 0 refills | Status: AC

## 2019-11-12 NOTE — ED Notes (Signed)
BP reported to Dr. Lanny Cramp.

## 2019-11-12 NOTE — ED Triage Notes (Signed)
Pt presents with cough and chest congestion x 1 week. Pt denies chest pain.
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
## 2019-11-13 LAB — SARS CORONAVIRUS 2 (TAT 6-24 HRS): SARS Coronavirus 2: NEGATIVE

## 2019-12-07 DIAGNOSIS — G4733 Obstructive sleep apnea (adult) (pediatric): Secondary | ICD-10-CM | POA: Diagnosis not present

## 2019-12-14 DIAGNOSIS — M25562 Pain in left knee: Secondary | ICD-10-CM | POA: Diagnosis not present

## 2019-12-14 DIAGNOSIS — Z96652 Presence of left artificial knee joint: Secondary | ICD-10-CM | POA: Diagnosis not present

## 2019-12-14 DIAGNOSIS — M25551 Pain in right hip: Secondary | ICD-10-CM | POA: Diagnosis not present

## 2019-12-14 DIAGNOSIS — M1611 Unilateral primary osteoarthritis, right hip: Secondary | ICD-10-CM | POA: Diagnosis not present

## 2019-12-14 DIAGNOSIS — M7061 Trochanteric bursitis, right hip: Secondary | ICD-10-CM | POA: Diagnosis not present

## 2019-12-23 ENCOUNTER — Telehealth: Payer: Self-pay | Admitting: Family Medicine

## 2019-12-23 NOTE — Telephone Encounter (Signed)
P.A.. [employer]  

## 2019-12-28 ENCOUNTER — Ambulatory Visit (HOSPITAL_COMMUNITY)
Admission: EM | Admit: 2019-12-28 | Discharge: 2019-12-28 | Disposition: A | Discharge status: Home / Self Care | Payer: Medicare PPO | Attending: Physician Assistant | Admitting: Physician Assistant

## 2019-12-28 ENCOUNTER — Other Ambulatory Visit: Payer: Self-pay | Admitting: Family Medicine

## 2019-12-28 ENCOUNTER — Other Ambulatory Visit: Payer: Self-pay

## 2019-12-28 ENCOUNTER — Encounter (HOSPITAL_COMMUNITY): Payer: Self-pay

## 2019-12-28 DIAGNOSIS — E1169 Type 2 diabetes mellitus with other specified complication: Secondary | ICD-10-CM

## 2019-12-28 DIAGNOSIS — L02415 Cutaneous abscess of right lower limb: Secondary | ICD-10-CM | POA: Diagnosis not present

## 2019-12-28 DIAGNOSIS — E119 Type 2 diabetes mellitus without complications: Secondary | ICD-10-CM

## 2019-12-28 MED ORDER — LIDOCAINE-EPINEPHRINE 1 %-1:100000 IJ SOLN
INTRAMUSCULAR | Status: AC
  Filled 2019-12-28: qty 1

## 2019-12-28 MED ORDER — DOXYCYCLINE HYCLATE 100 MG PO CAPS: 100.0000 mg | ORAL_CAPSULE | Freq: Two times a day (BID) | ORAL | 0 refills | Status: AC
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
## 2019-12-28 NOTE — Discharge Instructions (Signed)
Change the bandage daily.  Wash with soap and water.  While in the shower or bathing gently massage the area to express any further discharge  Take the medicine as prescribed, take with plenty of water  If reaccumulation occurs, worsening redness or swelling return or follow-up with your primary care, otherwise if feeling well continue to monitor

## 2019-12-28 NOTE — ED Triage Notes (Signed)
Pt was bit by an insect when she was sleeping a week ago on her right thigh. Pt states the area where she was bit is hard. The area is erythematous and the bump is purple. The bump is about the size of a quarter.

## 2020-01-07 DIAGNOSIS — G4733 Obstructive sleep apnea (adult) (pediatric): Secondary | ICD-10-CM | POA: Diagnosis not present

## 2020-01-08 NOTE — Telephone Encounter (Signed)
P.A. denied.  Filed appeal over the phone at [phone] with [facility]

## 2020-02-07 DIAGNOSIS — G4733 Obstructive sleep apnea (adult) (pediatric): Secondary | ICD-10-CM | POA: Diagnosis not present

## 2020-03-08 DIAGNOSIS — G4733 Obstructive sleep apnea (adult) (pediatric): Secondary | ICD-10-CM | POA: Diagnosis not present

## 2020-03-12 ENCOUNTER — Telehealth: Payer: Self-pay

## 2020-03-12 NOTE — Telephone Encounter (Signed)
I'll defer this to you

## 2020-03-12 NOTE — Telephone Encounter (Signed)
Pt would like to go back on the Xenical she states [employer] is supposed to pay for it.  States it not only helped with her weight loss but also with her bowels and chronic constipation and also BP.  Can pt have this again?

## 2020-03-12 NOTE — Telephone Encounter (Signed)
Have her set up a virtual visit

## 2020-03-13 NOTE — Telephone Encounter (Signed)
LVM for pt to call and make an appt. [place]

## 2020-03-13 NOTE — Telephone Encounter (Signed)
LVM for pt to call and schedule a virtual visit. Sandra Lawrence

## 2020-03-19 ENCOUNTER — Other Ambulatory Visit: Payer: Self-pay

## 2020-03-19 ENCOUNTER — Telehealth (INDEPENDENT_AMBULATORY_CARE_PROVIDER_SITE_OTHER): Payer: Medicare PPO | Admitting: Family Medicine

## 2020-03-19 ENCOUNTER — Encounter: Payer: Self-pay | Admitting: Family Medicine

## 2020-03-19 VITALS — Temp 96.9°F | Wt 296.0 lb

## 2020-03-19 DIAGNOSIS — Z6841 Body Mass Index (BMI) 40.0 and over, adult: Secondary | ICD-10-CM

## 2020-03-19 MED ORDER — ORLISTAT 120 MG PO CAPS: 120.0000 mg | ORAL_CAPSULE | Freq: Three times a day (TID) | ORAL | 3 refills | Status: DC

## 2020-03-19 NOTE — Progress Notes (Signed)
   Subjective:    Patient ID: Evert Kohl, female    DOB: March 08, 1946, 74 y.o.   MRN: [id number]  HPI I connected with  JANETTA VANDOREN on 03/19/20 by phone and verified that I am speaking with the correct person using two identifiers.  She does not have access to a computer or smart phone. I discussed the limitations of evaluation and management by telemedicine. The patient expressed understanding and agreed to proceed. She states that in the past she was given Xenical which helped her lose almost 50 pounds however insurance stopped covering this.  She now has a plan but states that it does cover it.  She had very little in the way of GI complications from this.  She would like to start back on it.   Review of Systems     Objective:   Physical Exam Alert and in no distress otherwise not examined       Assessment & Plan:  Morbid obesity with BMI of 40.0-44.9, adult ([place]) - Plan: orlistat (XENICAL) 120 MG capsule Since it works so well for her in the past, I think it is appropriate to place her back on it to help her strength.

## 2020-03-25 NOTE — Telephone Encounter (Signed)
See next notes, pt needs appt to discuss going back on this medication

## 2020-04-08 DIAGNOSIS — G4733 Obstructive sleep apnea (adult) (pediatric): Secondary | ICD-10-CM | POA: Diagnosis not present

## 2020-04-18 ENCOUNTER — Other Ambulatory Visit: Payer: Self-pay | Admitting: Internal Medicine

## 2020-04-18 ENCOUNTER — Other Ambulatory Visit: Payer: Self-pay | Admitting: Family Medicine

## 2020-04-18 ENCOUNTER — Telehealth: Payer: Self-pay | Admitting: Internal Medicine

## 2020-04-18 DIAGNOSIS — I119 Hypertensive heart disease without heart failure: Secondary | ICD-10-CM

## 2020-04-18 MED ORDER — TRAMADOL HCL 50 MG PO TABS: 50.0000 mg | ORAL_TABLET | Freq: Four times a day (QID) | ORAL | 1 refills | Status: DC | PRN

## 2020-04-18 NOTE — Telephone Encounter (Signed)
Pt states that she is leaving to go on a cruise on Sunday and she is having severe hip pain and tramadol is not working for her much but she is about out of it. She is due to a hip replacement but can't have it done right now. She would like something for pain as she doesn't want to go on a cruise and be in pain. Please advise

## 2020-04-19 ENCOUNTER — Ambulatory Visit (HOSPITAL_COMMUNITY)
Admission: EM | Admit: 2020-04-19 | Discharge: 2020-04-19 | Disposition: A | Discharge status: Home / Self Care | Payer: Medicare PPO | Attending: Family Medicine | Admitting: Family Medicine

## 2020-04-19 ENCOUNTER — Other Ambulatory Visit: Payer: Self-pay

## 2020-04-19 ENCOUNTER — Encounter (HOSPITAL_COMMUNITY): Payer: Self-pay | Admitting: *Deleted

## 2020-04-19 DIAGNOSIS — J4541 Moderate persistent asthma with (acute) exacerbation: Secondary | ICD-10-CM

## 2020-04-19 DIAGNOSIS — R062 Wheezing: Secondary | ICD-10-CM

## 2020-04-19 MED ORDER — ALBUTEROL SULFATE HFA 108 (90 BASE) MCG/ACT IN AERS: 1.0000 | INHALATION_SPRAY | Freq: Four times a day (QID) | RESPIRATORY_TRACT | 1 refills | Status: DC | PRN

## 2020-04-19 MED ORDER — PREDNISONE 10 MG PO TABS: ORAL_TABLET | ORAL | 0 refills | Status: DC

## 2020-04-19 MED ORDER — BENZONATATE 100 MG PO CAPS: 100.0000 mg | ORAL_CAPSULE | Freq: Three times a day (TID) | ORAL | 0 refills | Status: DC

## 2020-04-19 NOTE — ED Triage Notes (Signed)
Pt denies having had any runny nose or congestion; states started with her "normal" asthma attack 3 days ago; states couldn't find her albuterol HFA until yesterday. Has been having some relief with albuterol.  Pt concerned she is supposed to fly tomorrow.
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
## 2020-05-02 ENCOUNTER — Other Ambulatory Visit: Payer: Self-pay | Admitting: Family Medicine

## 2020-05-02 DIAGNOSIS — E119 Type 2 diabetes mellitus without complications: Secondary | ICD-10-CM

## 2020-05-05 ENCOUNTER — Other Ambulatory Visit: Payer: Self-pay

## 2020-05-05 ENCOUNTER — Encounter: Payer: Self-pay | Admitting: Family Medicine

## 2020-05-05 ENCOUNTER — Telehealth: Payer: Medicare PPO | Admitting: Family Medicine

## 2020-05-05 VITALS — Temp 98.6°F | Wt 296.0 lb

## 2020-05-05 DIAGNOSIS — J209 Acute bronchitis, unspecified: Secondary | ICD-10-CM

## 2020-05-05 MED ORDER — AMOXICILLIN 875 MG PO TABS: 875.0000 mg | ORAL_TABLET | Freq: Two times a day (BID) | ORAL | 0 refills | Status: DC

## 2020-05-05 NOTE — Progress Notes (Signed)
   Subjective:    Patient ID: Sandra Lawrence, female    DOB: 1946/02/22, 74 y.o.   MRN: [id number]  HPI I connected with  Sandra Lawrence on 05/05/20 by a video enabled telemedicine application and verified that I am speaking with the correct person using two identifiers.  Caregility used.  Needed: Office   patient: Home I discussed the limitations of evaluation and management by telemedicine. The patient expressed understanding and agreed to proceed. She has had difficulty with cough and congestion since 1 December.  Prior to a cruise she did go to an urgent care to get treated for her asthma and was given prednisone, Tessalon and continued on her inhaler.  While on the cruise she did get tested for Covid and was negative.  Continues have difficulty with a cough but no fever, chills, shortness of breath   Review of Systems     Objective:   Physical Exam Alert and visually in no distress and seems to be breathing normally.       Assessment & Plan:  Acute bronchitis, unspecified organism - Plan: amoxicillin (AMOXIL) 875 MG tablet I explained that I did not think this was Covid related and we will treat her with an antibiotic.  She will keep me informed as to how she is doing. 20 minutes spent in reviewing her record today history and documentation as well as coordination of care.

## 2020-05-08 DIAGNOSIS — G4733 Obstructive sleep apnea (adult) (pediatric): Secondary | ICD-10-CM | POA: Diagnosis not present

## 2020-05-13 ENCOUNTER — Other Ambulatory Visit: Payer: Self-pay | Admitting: Orthopedic Surgery

## 2020-05-13 DIAGNOSIS — M25551 Pain in right hip: Secondary | ICD-10-CM | POA: Diagnosis not present

## 2020-05-13 DIAGNOSIS — M545 Low back pain, unspecified: Secondary | ICD-10-CM

## 2020-05-14 ENCOUNTER — Other Ambulatory Visit: Payer: Self-pay

## 2020-05-14 ENCOUNTER — Ambulatory Visit (HOSPITAL_COMMUNITY)
Admission: EM | Admit: 2020-05-14 | Discharge: 2020-05-14 | Discharge status: Against Medical Advice | Payer: Medicare PPO

## 2020-05-14 NOTE — ED Notes (Signed)
Pt left before being seen

## 2020-05-22 ENCOUNTER — Other Ambulatory Visit: Payer: Self-pay | Admitting: Family Medicine

## 2020-05-27 DIAGNOSIS — M25551 Pain in right hip: Secondary | ICD-10-CM | POA: Diagnosis not present

## 2020-06-03 ENCOUNTER — Other Ambulatory Visit: Payer: Self-pay | Admitting: Family Medicine

## 2020-06-03 DIAGNOSIS — I119 Hypertensive heart disease without heart failure: Secondary | ICD-10-CM

## 2020-06-08 ENCOUNTER — Ambulatory Visit
Admission: RE | Admit: 2020-06-08 | Discharge: 2020-06-08 | Disposition: A | Discharge status: Home / Self Care | Payer: Medicare PPO | Source: Ambulatory Visit | Attending: Orthopedic Surgery | Admitting: Orthopedic Surgery

## 2020-06-08 ENCOUNTER — Other Ambulatory Visit: Payer: Self-pay

## 2020-06-08 ENCOUNTER — Other Ambulatory Visit: Payer: Medicare PPO

## 2020-06-08 DIAGNOSIS — M545 Low back pain, unspecified: Secondary | ICD-10-CM | POA: Diagnosis not present

## 2020-06-08 DIAGNOSIS — M25551 Pain in right hip: Secondary | ICD-10-CM

## 2020-06-08 DIAGNOSIS — G4733 Obstructive sleep apnea (adult) (pediatric): Secondary | ICD-10-CM | POA: Diagnosis not present

## 2020-06-08 DIAGNOSIS — M48061 Spinal stenosis, lumbar region without neurogenic claudication: Secondary | ICD-10-CM | POA: Diagnosis not present

## 2020-06-10 DIAGNOSIS — M25551 Pain in right hip: Secondary | ICD-10-CM | POA: Diagnosis not present

## 2020-06-15 ENCOUNTER — Other Ambulatory Visit: Payer: Self-pay | Admitting: Family Medicine

## 2020-06-15 NOTE — Telephone Encounter (Signed)
Requested medications are due for refill today.  unknown  Requested medications are on the active medications list.  yes  Last refill. 04/19/2020  Future visit scheduled.   Yes  Notes to clinic.  Dr. Redmond School no longer at practice, Merrie Roof no longer at practice. Inhaler should last 1 month. Pt is requesting too soon.

## 2020-06-16 NOTE — Telephone Encounter (Signed)
Requested medications are due for refill today.  yes  Requested medications are on the active medications list.  yes  Last refill. 04/19/2020  Future visit scheduled.   yes  Notes to clinic.  Notes to clinic.  Dr. Redmond School no longer at practice, Merrie Roof no longer at practice. Inhaler should last 1 month. Pt is requesting too soon.

## 2020-06-16 NOTE — Telephone Encounter (Signed)
Not our patient

## 2020-06-26 ENCOUNTER — Other Ambulatory Visit: Payer: Self-pay | Admitting: Family Medicine

## 2020-06-26 NOTE — Telephone Encounter (Signed)
  Notes to clinic Not a provider we assess rx for, please assess.

## 2020-06-28 ENCOUNTER — Telehealth: Payer: Self-pay

## 2020-06-28 NOTE — Telephone Encounter (Signed)
P.A.Marland Kitchen XENICAL

## 2020-06-28 NOTE — Telephone Encounter (Signed)
P.A. approved til 09/23/20, went thru for $9.85 for 90 days, pt informed

## 2020-07-09 DIAGNOSIS — G4733 Obstructive sleep apnea (adult) (pediatric): Secondary | ICD-10-CM | POA: Diagnosis not present

## 2020-08-06 DIAGNOSIS — G4733 Obstructive sleep apnea (adult) (pediatric): Secondary | ICD-10-CM | POA: Diagnosis not present

## 2020-08-07 ENCOUNTER — Other Ambulatory Visit: Payer: Self-pay | Admitting: Family Medicine

## 2020-08-07 DIAGNOSIS — I119 Hypertensive heart disease without heart failure: Secondary | ICD-10-CM

## 2020-08-11 DIAGNOSIS — M25551 Pain in right hip: Secondary | ICD-10-CM | POA: Diagnosis not present

## 2020-08-19 DIAGNOSIS — M25551 Pain in right hip: Secondary | ICD-10-CM | POA: Diagnosis not present

## 2020-08-26 ENCOUNTER — Ambulatory Visit: Payer: Self-pay | Admitting: Family Medicine

## 2020-09-06 DIAGNOSIS — G4733 Obstructive sleep apnea (adult) (pediatric): Secondary | ICD-10-CM | POA: Diagnosis not present

## 2020-09-17 DIAGNOSIS — M25551 Pain in right hip: Secondary | ICD-10-CM | POA: Diagnosis not present

## 2020-09-22 ENCOUNTER — Ambulatory Visit (HOSPITAL_COMMUNITY)
Admission: EM | Admit: 2020-09-22 | Discharge: 2020-09-22 | Disposition: A | Discharge status: Home / Self Care | Payer: Medicare PPO

## 2020-09-22 ENCOUNTER — Other Ambulatory Visit: Payer: Self-pay

## 2020-09-22 ENCOUNTER — Encounter (HOSPITAL_COMMUNITY): Payer: Self-pay

## 2020-09-22 DIAGNOSIS — L03311 Cellulitis of abdominal wall: Secondary | ICD-10-CM | POA: Diagnosis not present

## 2020-09-22 DIAGNOSIS — L02211 Cutaneous abscess of abdominal wall: Secondary | ICD-10-CM | POA: Diagnosis not present

## 2020-09-22 LAB — CBG MONITORING, ED: Glucose-Capillary: 126 mg/dL — ABNORMAL HIGH (ref 70–99)

## 2020-09-22 MED ORDER — DOXYCYCLINE HYCLATE 100 MG PO CAPS: 100.0000 mg | ORAL_CAPSULE | Freq: Two times a day (BID) | ORAL | 0 refills | Status: AC
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
## 2020-09-22 NOTE — Discharge Instructions (Addendum)
Take the doxycycline twice a day for the next 10 days.   You can apply a warm compress 3-4 times a day for comfort.  Clean the wound with warm soapy water at least twice a day.    If you notice any worsening swelling, redness, red streaks, or develop a fever, go to the Emergency Department for further evaluation.

## 2020-09-22 NOTE — ED Triage Notes (Addendum)
Pt reports felt a strong pinching pain on skin of right lower abdomen and was left with a painful bump. Over the past week + since this happened pt has noted the area has gotten larger and started draining. Open area noted with serosanguinous drainage and redness surrounding site.   Pt states she stopped taking trulicity without provider order and started taking something over the counter for her blood sugar.

## 2020-09-26 ENCOUNTER — Ambulatory Visit: Payer: Medicare PPO | Admitting: Family Medicine

## 2020-09-26 ENCOUNTER — Encounter: Payer: Self-pay | Admitting: Family Medicine

## 2020-09-26 ENCOUNTER — Other Ambulatory Visit: Payer: Self-pay

## 2020-09-26 VITALS — BP 164/98 | HR 71 | Temp 97.2°F | Ht 66.0 in | Wt 287.6 lb

## 2020-09-26 DIAGNOSIS — L0291 Cutaneous abscess, unspecified: Secondary | ICD-10-CM

## 2020-09-26 DIAGNOSIS — E119 Type 2 diabetes mellitus without complications: Secondary | ICD-10-CM | POA: Diagnosis not present

## 2020-09-26 DIAGNOSIS — I119 Hypertensive heart disease without heart failure: Secondary | ICD-10-CM

## 2020-09-26 DIAGNOSIS — Z6841 Body Mass Index (BMI) 40.0 and over, adult: Secondary | ICD-10-CM | POA: Diagnosis not present

## 2020-09-26 LAB — POCT GLYCOSYLATED HEMOGLOBIN (HGB A1C): Hemoglobin A1C: 7.5 % — AB (ref 4.0–5.6)

## 2020-09-26 NOTE — Progress Notes (Signed)
   Subjective:    Patient ID: Sandra Lawrence, female    DOB: Jul 12, 1945, 75 y.o.   MRN: [id number]  HPI She is here for recheck.  She was seen on the ninth in an urgent care center for evaluation of abdominal abscess.  She thought it was a insect bite.  She was placed on doxycycline and is here for follow-up.  She has not gotten follow-up on her diabetes.  She presently is not taking Trulicity but apparently is taking her other medications and also taking blood pressure medications.   Review of Systems     Objective:   Physical Exam Exam of the abdomen does show a lesion to the right periumbilical area that is raw and slightly erythematous and induration of approximately 4 cm.  It is not fluctuant. Hemoglobin A1c is 7.5      Assessment & Plan:  Diabetes mellitus type 2, noninsulin dependent (HCC)  Morbid obesity with BMI of 40.0-44.9, adult (HCC)  Abscess She is to continue on the doxycycline.  If picture was taken of it so we can compare it with her next visit. She is to take 2 metoprolol twice per day and recheck here with her next visit.

## 2020-09-26 NOTE — Addendum Note (Signed)
Addended by: Elyse Jarvis on: 09/26/2020 01:39 PM   Modules accepted: Orders

## 2020-10-02 ENCOUNTER — Other Ambulatory Visit: Payer: Self-pay

## 2020-10-02 ENCOUNTER — Ambulatory Visit: Payer: Medicare PPO | Admitting: Family Medicine

## 2020-10-02 ENCOUNTER — Encounter: Payer: Self-pay | Admitting: Family Medicine

## 2020-10-02 VITALS — BP 168/88 | HR 70 | Temp 97.8°F | Wt 289.6 lb

## 2020-10-02 DIAGNOSIS — L0291 Cutaneous abscess, unspecified: Secondary | ICD-10-CM | POA: Diagnosis not present

## 2020-10-02 DIAGNOSIS — E119 Type 2 diabetes mellitus without complications: Secondary | ICD-10-CM

## 2020-10-02 DIAGNOSIS — T383X5A Adverse effect of insulin and oral hypoglycemic [antidiabetic] drugs, initial encounter: Secondary | ICD-10-CM | POA: Diagnosis not present

## 2020-10-02 HISTORY — DX: Adverse effect of insulin and oral hypoglycemic (antidiabetic) drugs, initial encounter: T38.3X5A

## 2020-10-02 MED ORDER — PIOGLITAZONE HCL 30 MG PO TABS: 30.0000 mg | ORAL_TABLET | Freq: Every day | ORAL | 5 refills | Status: DC

## 2020-10-02 NOTE — Progress Notes (Signed)
   Subjective:    Patient ID: Sandra Lawrence, female    DOB: 1945-11-13, 75 y.o.   MRN: [id number]  HPI She is here for a recheck.  The lesion on the right abdominal area does seem to be getting better.  She has about 6 more days worth of the antibiotic.  She also has concerns about her diabetes.  She cannot take metformin due to GI symptoms.  Farxiga caused candidiasis.  She states that Trulicity actually caused her to gain weight.   Review of Systems     Objective:   Physical Exam Alert and in no distress.  Exam of the abdominal area does show the wound to be healing quite nicely with less induration.       Assessment & Plan:  Abscess  Diabetes mellitus type 2, noninsulin dependent ([place]) - Plan: pioglitazone (ACTOS) 30 MG tablet, Amb Referral to Nutrition and Diabetic Education  Adverse effect of metformin, initial encounter She is to finish out the antibiotic which will take another 6 days. I am limited as to what I can use due to adverse reaction to various medications.  We will place her on Actos, get her involved in diabetes and nutrition and follow-up.

## 2020-10-07 ENCOUNTER — Telehealth: Payer: Self-pay

## 2020-10-07 MED ORDER — DOXYCYCLINE HYCLATE 100 MG PO TABS: 100.0000 mg | ORAL_TABLET | Freq: Two times a day (BID) | ORAL | 0 refills | Status: DC

## 2020-10-07 NOTE — Telephone Encounter (Signed)
Pt. Called LM stating that she saw you recently and you were supposed to send in an anti biotic for her, doxycycline but the pharmacy said they never received it. She wanted to know if you could send that in for her she was taking some old ones she had but ran out.

## 2020-10-07 NOTE — Telephone Encounter (Signed)
I called it in 

## 2020-10-07 NOTE — Telephone Encounter (Signed)
Pt. Aware that medication has been sent in.

## 2020-10-29 ENCOUNTER — Other Ambulatory Visit: Payer: Self-pay | Admitting: Family Medicine

## 2020-10-29 NOTE — Telephone Encounter (Signed)
[facility] is requesting to fill pt doxycycline. Please advise [facility]

## 2020-10-30 LAB — HM DIABETES EYE EXAM

## 2020-10-31 ENCOUNTER — Telehealth: Payer: Self-pay

## 2020-10-31 NOTE — Telephone Encounter (Signed)
Pt. Called stating she saw you for a spider bite a few weeks ago and you called her in an antibiotic. She said it was almost healed up but then it started oozing again a few days ago. She wanted to know if you could call her in another round of the medication so it could heal completely up.

## 2020-11-01 MED ORDER — DOXYCYCLINE HYCLATE 100 MG PO TABS: 100.0000 mg | ORAL_TABLET | Freq: Two times a day (BID) | ORAL | 0 refills | Status: DC

## 2020-11-07 ENCOUNTER — Other Ambulatory Visit: Payer: Self-pay | Admitting: Family Medicine

## 2020-11-07 DIAGNOSIS — E785 Hyperlipidemia, unspecified: Secondary | ICD-10-CM

## 2020-11-07 DIAGNOSIS — E1169 Type 2 diabetes mellitus with other specified complication: Secondary | ICD-10-CM

## 2020-11-10 ENCOUNTER — Encounter: Payer: Self-pay | Admitting: Family Medicine

## 2020-11-14 ENCOUNTER — Other Ambulatory Visit: Payer: Self-pay | Admitting: Family Medicine

## 2020-11-14 MED ORDER — METOPROLOL TARTRATE 50 MG PO TABS: 50.0000 mg | ORAL_TABLET | Freq: Two times a day (BID) | ORAL | 3 refills | Status: DC

## 2020-11-20 ENCOUNTER — Ambulatory Visit: Payer: Medicare PPO | Admitting: Registered"

## 2020-12-18 ENCOUNTER — Encounter (INDEPENDENT_AMBULATORY_CARE_PROVIDER_SITE_OTHER): Payer: Self-pay | Admitting: Family Medicine

## 2020-12-18 ENCOUNTER — Other Ambulatory Visit: Payer: Self-pay

## 2020-12-18 ENCOUNTER — Ambulatory Visit (INDEPENDENT_AMBULATORY_CARE_PROVIDER_SITE_OTHER): Payer: Medicare PPO | Admitting: Family Medicine

## 2020-12-18 VITALS — BP 141/68 | HR 72 | Temp 98.6°F | Ht 67.0 in | Wt 286.0 lb

## 2020-12-18 DIAGNOSIS — E1159 Type 2 diabetes mellitus with other circulatory complications: Secondary | ICD-10-CM

## 2020-12-18 DIAGNOSIS — Z1331 Encounter for screening for depression: Secondary | ICD-10-CM

## 2020-12-18 DIAGNOSIS — D649 Anemia, unspecified: Secondary | ICD-10-CM

## 2020-12-18 DIAGNOSIS — Z6841 Body Mass Index (BMI) 40.0 and over, adult: Secondary | ICD-10-CM | POA: Diagnosis not present

## 2020-12-18 DIAGNOSIS — E119 Type 2 diabetes mellitus without complications: Secondary | ICD-10-CM | POA: Diagnosis not present

## 2020-12-18 DIAGNOSIS — I152 Hypertension secondary to endocrine disorders: Secondary | ICD-10-CM | POA: Diagnosis not present

## 2020-12-18 DIAGNOSIS — E1169 Type 2 diabetes mellitus with other specified complication: Secondary | ICD-10-CM

## 2020-12-18 DIAGNOSIS — R0602 Shortness of breath: Secondary | ICD-10-CM | POA: Diagnosis not present

## 2020-12-18 DIAGNOSIS — E782 Mixed hyperlipidemia: Secondary | ICD-10-CM | POA: Diagnosis not present

## 2020-12-18 DIAGNOSIS — E785 Hyperlipidemia, unspecified: Secondary | ICD-10-CM

## 2020-12-18 DIAGNOSIS — R5383 Other fatigue: Secondary | ICD-10-CM | POA: Diagnosis not present

## 2020-12-18 DIAGNOSIS — F39 Unspecified mood [affective] disorder: Secondary | ICD-10-CM

## 2020-12-18 DIAGNOSIS — Z0289 Encounter for other administrative examinations: Secondary | ICD-10-CM

## 2020-12-18 NOTE — Patient Instructions (Signed)
Health Maintenance Due  Topic Date Due   DEXA SCAN  05/15/2013   COLONOSCOPY (Pts 45-79yr Insurance coverage will need to be confirmed)  08/15/2017   FOOT EXAM  11/04/2017   COVID-19 Vaccine (3 - Booster for Pfizer series) 11/26/2019   Zoster Vaccines- Shingrix (2 of 2) 08/22/2020   MAMMOGRAM  11/09/2020   INFLUENZA VACCINE  12/15/2020    [facility] 12/18/2020 09/26/2020 03/19/2020  Decreased Interest 3 0 0  Down, Depressed, Hopeless 2 0 0  PHQ - 2 Score 5 0 0  Altered sleeping 3 - -  Tired, decreased energy 3 - -  Change in appetite 2 - -  Feeling bad or failure about yourself  2 - -  Trouble concentrating 3 - -  Moving slowly or fidgety/restless 3 - -  Suicidal thoughts 1 - -  PHQ-9 Score 22 - -  Difficult doing work/chores Very difficult - -

## 2020-12-19 LAB — LIPID PANEL WITH LDL/HDL RATIO
Cholesterol, Total: 171 mg/dL (ref 100–199)
HDL: 53 mg/dL (ref >39)
LDL Chol Calc (NIH): 103 mg/dL — ABNORMAL HIGH (ref 0–99)
LDL/HDL Ratio: 1.9 ratio (ref 0.0–3.2)
Triglycerides: 78 mg/dL (ref 0–149)
VLDL Cholesterol Cal: 15 mg/dL (ref 5–40)

## 2020-12-19 LAB — T3: T3, Total: 103 ng/dL (ref 71–180)

## 2020-12-19 LAB — CBC WITH DIFFERENTIAL
Basophils Absolute: 0.1 10*3/uL (ref 0.0–0.2)
Basos: 1 %
EOS (ABSOLUTE): 0.2 10*3/uL (ref 0.0–0.4)
Eos: 4 %
Hematocrit: 42.1 % (ref 34.0–46.6)
Hemoglobin: 13.4 g/dL (ref 11.1–15.9)
Immature Grans (Abs): 0 10*3/uL (ref 0.0–0.1)
Immature Granulocytes: 0 %
Lymphocytes Absolute: 1.6 10*3/uL (ref 0.7–3.1)
Lymphs: 37 %
MCH: 28.3 pg (ref 26.6–33.0)
MCHC: 31.8 g/dL (ref 31.5–35.7)
MCV: 89 fL (ref 79–97)
Monocytes Absolute: 0.4 10*3/uL (ref 0.1–0.9)
Monocytes: 9 %
Neutrophils Absolute: 2.1 10*3/uL (ref 1.4–7.0)
Neutrophils: 49 %
RBC: 4.73 x10E6/uL (ref 3.77–5.28)
RDW: 13.5 % (ref 11.7–15.4)
WBC: 4.3 10*3/uL (ref 3.4–10.8)

## 2020-12-19 LAB — COMPREHENSIVE METABOLIC PANEL
ALT: 16 IU/L (ref 0–32)
AST: 17 IU/L (ref 0–40)
Albumin/Globulin Ratio: 1.6 (ref 1.2–2.2)
Albumin: 4.1 g/dL (ref 3.7–4.7)
Alkaline Phosphatase: 120 IU/L (ref 44–121)
BUN/Creatinine Ratio: 25 (ref 12–28)
BUN: 14 mg/dL (ref 8–27)
Bilirubin Total: 0.4 mg/dL (ref 0.0–1.2)
CO2: 23 mmol/L (ref 20–29)
Calcium: 9.4 mg/dL (ref 8.7–10.3)
Chloride: 102 mmol/L (ref 96–106)
Creatinine, Ser: 0.57 mg/dL (ref 0.57–1.00)
Globulin, Total: 2.6 g/dL (ref 1.5–4.5)
Glucose: 135 mg/dL — ABNORMAL HIGH (ref 65–99)
Potassium: 4 mmol/L (ref 3.5–5.2)
Sodium: 141 mmol/L (ref 134–144)
Total Protein: 6.7 g/dL (ref 6.0–8.5)
eGFR: 96 mL/min/{1.73_m2} (ref >59)

## 2020-12-19 LAB — T4, FREE: Free T4: 1.27 ng/dL (ref 0.82–1.77)

## 2020-12-19 LAB — FOLATE: Folate: 13.5 ng/mL (ref >3.0)

## 2020-12-19 LAB — HEMOGLOBIN A1C
Est. average glucose Bld gHb Est-mCnc: 171 mg/dL
Hgb A1c MFr Bld: 7.6 % — ABNORMAL HIGH (ref 4.8–5.6)

## 2020-12-19 LAB — TSH: TSH: 1.65 u[IU]/mL (ref 0.450–4.500)

## 2020-12-19 LAB — VITAMIN D 25 HYDROXY (VIT D DEFICIENCY, FRACTURES): Vit D, 25-Hydroxy: 23 ng/mL — ABNORMAL LOW (ref 30.0–100.0)

## 2020-12-19 LAB — INSULIN, RANDOM: INSULIN: 12.1 u[IU]/mL (ref 2.6–24.9)

## 2020-12-19 LAB — VITAMIN B12: Vitamin B-12: 832 pg/mL (ref 232–1245)

## 2020-12-22 NOTE — Progress Notes (Signed)
Chief Complaint:   OBESITY Sandra Lawrence (MR# [id number]) is a 75 y.o. female who presents for evaluation and treatment of obesity and related comorbidities. Current BMI is Body mass index is 44.79 kg/m. Sandra Lawrence has been struggling with her weight for many years and has been unsuccessful in either losing weight, maintaining weight loss, or reaching her healthy weight goal.  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 She lives alone, and she has 6 kids. She craves sweets and meats, and she drinks liquid calories. Sandra Lawrence sent her to [place] as she needs right hip replacement. Mobility is a significant issue for her.  Sandra Lawrence is currently in the action stage of change and ready to dedicate time achieving and maintaining a healthier weight. Sandra Lawrence is interested in becoming our patient and working on intensive lifestyle modifications including (but not limited to) diet and exercise for weight loss.  Sandra Lawrence's habits were reviewed today and are as follows: she struggles with family and or coworkers weight loss sabotage, her desired weight loss is 111 lbs, she has been heavy most of her life, she started gaining weight in 1978, her heaviest weight ever was 306 pounds, she has significant food cravings issues, she skips meals frequently, she is frequently drinking liquids with calories, she frequently makes poor food choices, she frequently eats larger portions than normal, and she struggles with emotional eating.  Depression Screen Sandra Lawrence's Food and Mood (modified PHQ-9) score was 22.  Depression screen PHQ 2/9 12/18/2020  Decreased Interest 3  Down, Depressed, Hopeless 2  PHQ - 2 Score 5  Altered sleeping 3  Tired, decreased energy 3  Change in appetite 2  Feeling bad or failure about yourself  2  Trouble concentrating 3  Moving slowly or fidgety/restless 3  Suicidal thoughts 1  PHQ-9 Score 22  Difficult doing work/chores Very difficult   Subjective:   1. Other fatigue Sandra Lawrence has  obstructive sleep apnea, but she is not using her CPAP regularly. She moved in May and she hasn't started back using it yet. Sandra Lawrence admits to daytime somnolence and admits to waking up still tired. Patent has a history of symptoms of daytime fatigue. Sandra Lawrence generally gets 3 or 4 hours of sleep per night, and states that she has nightime awakenings. Snoring is present. Apneic episodes are present. Epworth Sleepiness Score is 19.  2. SOB (shortness of breath) on exertion Sandra Lawrence notes increasing shortness of breath with exercising and seems to be worsening over time with weight gain. She notes getting out of breath sooner with activity than she used to. This has not gotten worse recently. Sandra Lawrence denies shortness of breath at rest or orthopnea.  3. Diabetes mellitus type 2, diet-controlled (Sandra Lawrence) Sandra Lawrence's fasting BGs range between 114-136. She doesn't like to take her medications for diabetes mellitus such as metformin, and cinnamon hurts her stomach. She has not had shots for diabetes mellitus in the past. She declines medications, and she states [place] "tares her vagina up". Last A1c was 2 and a half months ago.  4. Hypertension associated with diabetes (Sandra Lawrence) Sandra Lawrence taking metoprolol and Hyzaar. She is asymptomatic and she has no concerns.  5. Mixed diabetic hyperlipidemia associated with type 2 diabetes mellitus (Sandra Lawrence) Sandra Lawrence is currently taking Pravachol.  6. Anemia, unspecified type Sandra Lawrence has a history of anemia, and she is not on supplementation currently. She is asymptomatic.   7. Mood disorder (Sandra Lawrence)- emotional eating Sandra Lawrence's PHQ-9 score is 22. She is not on  medications, and she has a history of depression and a history of generalized anxiety disorder. She denies that she has depression or suicidal ideations. She says it is her weight causing her to answer questions that way, no depression.  Assessment/Plan:   1. Other fatigue Sandra Lawrence does feel that her weight is causing her energy to be lower than  it should be. Fatigue may be related to obesity, depression or many other causes. Labs will be ordered, and in the meanwhile, [name] will focus on self care including making healthy food choices, increasing physical activity and focusing on stress reduction.  - Comprehensive metabolic panel - Vitamin 123456 - VITAMIN D 25 Hydroxy (Vit-D Deficiency, Fractures) - TSH - Folate - T3 - CBC With Differential - EKG 12-Lead - T4, free  2. SOB (shortness of breath) on exertion Sandra Lawrence does feel that she gets out of breath more easily that she used to when she exercises. Sandra Lawrence's shortness of breath appears to be obesity related and exercise induced. She has agreed to work on weight loss and gradually increase exercise to treat her exercise induced shortness of breath. Will continue to monitor closely.  3. Diabetes mellitus type 2, diet-controlled (Sandra Lawrence) We will check labs today. Sandra Lawrence will continue prudent nutritional plan, and will strongly consider medications in the future to help with diabetes mellitus and aid in weight loss. Good blood sugar control is important to decrease the likelihood of diabetic complications such as nephropathy, neuropathy, limb loss, blindness, coronary artery disease, and death. Intensive lifestyle modification including diet, exercise and weight loss are the first line of treatment for diabetes.   - Insulin, random - Hemoglobin A1c  4. Hypertension associated with diabetes (Sandra Lawrence) Sandra Lawrence is stable, and will continue working on healthy weight loss and exercise to improve blood pressure control. We will watch for signs of hypotension as she continues her lifestyle modifications. We will check labs today.  - Comprehensive metabolic panel  5. Mixed diabetic hyperlipidemia associated with type 2 diabetes mellitus (Sandra Lawrence) Cardiovascular risk and specific lipid/LDL goals reviewed. We discussed several lifestyle modifications today. We will check labs today. Sandra Lawrence will continue to work on  diet, exercise and weight loss efforts. Orders and follow up as documented in patient record.   Counseling Intensive lifestyle modifications are the first line treatment for this issue. Dietary changes: Increase soluble fiber. Decrease simple carbohydrates. Exercise changes: Moderate to vigorous-intensity aerobic activity 150 minutes per week if tolerated. Lipid-lowering medications: see documented in medical record.  - Insulin, random - Hemoglobin A1c - Lipid Panel With LDL/HDL Ratio  6. Anemia, unspecified type We will check labs today. Orders and follow up as documented in patient record.  Counseling Iron is essential for our bodies to make red blood cells.  Reasons that someone may be deficient include: an iron-deficient diet (more likely in those following vegan or vegetarian diets), women with heavy menses, patients with GI disorders or poor absorption, patients that have had bariatric surgery, frequent blood donors, patients with cancer, and patients with heart disease.   Iron-rich foods include dark leafy greens, red and white meats, eggs, seafood, and beans.   Certain foods and drinks prevent your body from absorbing iron properly. Avoid eating these foods in the same meal as iron-rich foods or with iron supplements. These foods include: coffee, black tea, and red wine; milk, dairy products, and foods that are high in calcium; beans and soybeans; whole grains.  Constipation can be a side effect of iron supplementation. Increased water and  fiber intake are helpful. Water goal: > 2 liters/day. Fiber goal: > 25 grams/day.  7. Mood disorder ([place])- emotional eating Behavior modification techniques were discussed today to help Sandra Lawrence deal with her emotional/non-hunger eating behaviors. Orders and follow up as documented in patient record.   8. Screening for depression Sandra Lawrence had a positive depression screening. Depression is commonly associated with obesity and often results in emotional  eating behaviors. We will monitor this closely and work on CBT to help improve the non-hunger eating patterns. Referral to Psychology may be required if no improvement is seen as she continues in our clinic.  9. Morbid obesity with BMI of 40.0-44.9, adult [facility]) Sandra Lawrence is currently in the action stage of change and her goal is to continue with weight loss efforts. I recommend Sandra Lawrence begin the structured treatment plan as follows:  She has agreed to the Category 2 Plan.  Exercise goals: As is.   Behavioral modification strategies: decreasing simple carbohydrates, meal planning and cooking strategies, keeping healthy foods in the home, and planning for success.  She was informed of the importance of frequent follow-up visits to maximize her success with intensive lifestyle modifications for her multiple health conditions. She was informed we would discuss her lab results at her next visit unless there is a critical issue that needs to be addressed sooner. Sandra Lawrence agreed to keep her next visit at the agreed upon time to discuss these results.  Objective:   Blood pressure (!) 141/68, pulse 72, temperature 98.6 F (37 C), height '5\' 7"'$  (1.702 m), weight 286 lb (129.7 kg), SpO2 99 %. Body mass index is 44.79 kg/m.  EKG: Normal sinus rhythm, rate 71 BPM.  Indirect Calorimeter completed today shows a VO2 of 248 and a REE of 1729.  Her calculated basal metabolic rate is [id number] thus her basal metabolic rate is worse than expected.  General: Cooperative, alert, well developed, in no acute distress. HEENT: Conjunctivae and lids unremarkable. Cardiovascular: Regular rhythm.  Lungs: Normal work of breathing. Neurologic: No focal deficits.   Lab Results  Component Value Date   CREATININE 0.57 12/18/2020   BUN 14 12/18/2020   NA 141 12/18/2020   K 4.0 12/18/2020   CL 102 12/18/2020   CO2 23 12/18/2020   Lab Results  Component Value Date   ALT 16 12/18/2020   AST 17 12/18/2020   ALKPHOS 120  12/18/2020   BILITOT 0.4 12/18/2020   Lab Results  Component Value Date   HGBA1C 7.6 (H) 12/18/2020   HGBA1C 7.5 (A) 09/26/2020   HGBA1C 7.9 (A) 08/20/2019   HGBA1C 8.6 (A) 02/21/2019   HGBA1C 7.1 11/04/2016   Lab Results  Component Value Date   INSULIN 12.1 12/18/2020   Lab Results  Component Value Date   TSH 1.650 12/18/2020   Lab Results  Component Value Date   CHOL 171 12/18/2020   HDL 53 12/18/2020   LDLCALC 103 (H) 12/18/2020   TRIG 78 12/18/2020   CHOLHDL 3.8 02/21/2019   Lab Results  Component Value Date   WBC 4.3 12/18/2020   HGB 13.4 12/18/2020   HCT 42.1 12/18/2020   MCV 89 12/18/2020   PLT 243 02/21/2019   No results found for: IRON, TIBC, FERRITIN Obesity Behavioral Intervention:   Approximately 15 minutes were spent on the discussion below.  ASK: We discussed the diagnosis of obesity with Sandra Lawrence today and Khris agreed to give [place] permission to discuss obesity behavioral modification therapy today.  ASSESS: Sandra Lawrence has the diagnosis of obesity  and her BMI today is 44.78. Tayley is in the action stage of change.   ADVISE: Sandra Lawrence was educated on the multiple health risks of obesity as well as the benefit of weight loss to improve her health. She was advised of the need for long term treatment and the importance of lifestyle modifications to improve her current health and to decrease her risk of future health problems.  AGREE: Multiple dietary modification options and treatment options were discussed and Arabelle agreed to follow the recommendations documented in the above note.  ARRANGE: Nocole was educated on the importance of frequent visits to treat obesity as outlined per CMS and USPSTF guidelines and agreed to schedule her next follow up appointment today.  Attestation Statements:   Reviewed by clinician on day of visit: allergies, medications, problem list, medical history, surgical history, family history, social history, and previous encounter  notes.   Wilhemena Durie, am acting as transcriptionist for [employer], DO.  I have reviewed the above documentation for accuracy and completeness, and I agree with the above. Marjory Sneddon, D.O.  The [place] was signed into law in 2016 which includes the topic of electronic health records.  This provides immediate access to information in MyChart.  This includes consultation notes, operative notes, office notes, lab results and pathology reports.  If you have any questions about what you read please let us know at your next visit so we can discuss your concerns and take corrective action if need be.  We are right here with you.

## 2020-12-25 ENCOUNTER — Ambulatory Visit: Payer: Medicare PPO | Admitting: Family Medicine

## 2021-01-01 ENCOUNTER — Ambulatory Visit (INDEPENDENT_AMBULATORY_CARE_PROVIDER_SITE_OTHER): Payer: Medicare PPO | Admitting: Family Medicine

## 2021-01-09 DIAGNOSIS — M25551 Pain in right hip: Secondary | ICD-10-CM | POA: Diagnosis not present

## 2021-01-12 ENCOUNTER — Encounter (INDEPENDENT_AMBULATORY_CARE_PROVIDER_SITE_OTHER): Payer: Self-pay | Admitting: Family Medicine

## 2021-01-12 ENCOUNTER — Ambulatory Visit (INDEPENDENT_AMBULATORY_CARE_PROVIDER_SITE_OTHER): Payer: Medicare PPO | Admitting: Family Medicine

## 2021-01-12 ENCOUNTER — Other Ambulatory Visit: Payer: Self-pay

## 2021-01-12 VITALS — BP 171/95 | HR 65 | Temp 98.8°F | Ht 67.0 in | Wt 273.0 lb

## 2021-01-12 DIAGNOSIS — E1159 Type 2 diabetes mellitus with other circulatory complications: Secondary | ICD-10-CM | POA: Diagnosis not present

## 2021-01-12 DIAGNOSIS — E1169 Type 2 diabetes mellitus with other specified complication: Secondary | ICD-10-CM

## 2021-01-12 DIAGNOSIS — E559 Vitamin D deficiency, unspecified: Secondary | ICD-10-CM | POA: Diagnosis not present

## 2021-01-12 DIAGNOSIS — M167 Other unilateral secondary osteoarthritis of hip: Secondary | ICD-10-CM | POA: Diagnosis not present

## 2021-01-12 DIAGNOSIS — Z6841 Body Mass Index (BMI) 40.0 and over, adult: Secondary | ICD-10-CM | POA: Diagnosis not present

## 2021-01-12 DIAGNOSIS — E782 Mixed hyperlipidemia: Secondary | ICD-10-CM

## 2021-01-12 DIAGNOSIS — I152 Hypertension secondary to endocrine disorders: Secondary | ICD-10-CM | POA: Diagnosis not present

## 2021-01-12 MED ORDER — VITAMIN D (ERGOCALCIFEROL) 1.25 MG (50000 UNIT) PO CAPS: 50000.0000 [IU] | ORAL_CAPSULE | ORAL | 0 refills | Status: DC

## 2021-01-12 MED ORDER — ROSUVASTATIN CALCIUM 40 MG PO TABS: 40.0000 mg | ORAL_TABLET | Freq: Every day | ORAL | 0 refills | Status: DC

## 2021-01-13 NOTE — Progress Notes (Signed)
Chief Complaint:   OBESITY Sandra Lawrence is here to discuss her progress with her obesity treatment plan along with follow-up of her obesity related diagnoses. [name] is on the Category 2 Plan and states she is following her eating plan approximately 98% of the time. [name] states she is not currently exercising.  Today's visit was #: 2 Starting weight: 286 lbs Starting date: 12/18/2020 Today's weight: 273 lbs Today's date: 01/12/2021 Total lbs lost to date: 13 Total lbs lost since last in-office visit: 13  Interim History: GIHANNA Lawrence is here today for her first follow-up office visit since starting the program with [place].  All blood work/ lab tests that were recently ordered by myself or an outside provider were reviewed with patient today per their request.   Extended time was spent counseling her on all new disease processes that were discovered or preexisting ones that are affected by BMI.  she understands that many of these abnormalities will need to monitored regularly along with the current treatment plan of prudent dietary changes, in which we are making each and every office visit, to improve these health parameters. [name] is here for a follow up office visit.  We reviewed her meal plan and questions were answered.  Patient's food recall appears to be accurate and consistent with what is on plan when she is following it.   When eating on plan, her hunger and cravings are well controlled.   Sandra Lawrence's last OV was 3.5 weeks ago. She enjoys her food even more now without it being fried. She is going on a cruise to the [place] with her son on Sept 14-26th.  Assessment/Plan:  No orders of the defined types were placed in this encounter.   Medications Discontinued During This Encounter  Medication Reason   doxycycline (VIBRA-TABS) 100 MG tablet Error   pravastatin (PRAVACHOL) 40 MG tablet      Meds ordered this encounter  Medications   Vitamin D, Ergocalciferol, (DRISDOL) 1.25 MG  (50000 UNIT) CAPS capsule    Sig: Take 1 capsule (50,000 Units total) by mouth every 7 (seven) days.    Dispense:  4 capsule    Refill:  0    Ov for RF; 30 d supply   rosuvastatin (CRESTOR) 40 MG tablet    Sig: Take 1 tablet (40 mg total) by mouth at bedtime.    Dispense:  30 tablet    Refill:  0    Ov for RF     1. Type 2 diabetes mellitus with other specified complication, without long-term current use of insulin ([place]) Discussed labs with patient today. [name] doesn't check BS's at home. Her PCP has tried various meds with pt that she could not tolerate due to GI upset. She failed [place], Trulicity, and Metformin.  Plan: Due to medication class failness in the past, we will leave medication management to PCP. Continue prudent nutritional plan and weight loss. Decrease simple sugars. Pt declines another trial of GLP-1.  2. Hypertension associated with type 2 diabetes mellitus ([place]) Discussed labs with patient today. Not at goal (secondary to pain?). [name] is asymptomatic with no concerns. Medications: Hyzaar and Lopressor.   Plan: Pt will follow prudent nutritional plan and decrease salt intake. Follow closely in 2 weeks.  BP Readings from Last 3 Encounters:  01/12/21 (!) 171/95  12/18/20 (!) 141/68  10/02/20 (!) 168/88   Lab Results  Component Value Date   CREATININE 0.57 12/18/2020   3. Mixed diabetic hyperlipidemia  associated with type 2 diabetes mellitus ([place]) Discussed labs with patient today. Course: LDL Not at goal. Lipid-lowering medications: Pravachol. Compliance with medication is good- takes every morning with other meds regularly. She is tolerating it well with no issues.   Plan: Change statin from Pravachol 40 mg to Crestor 40 mg every night. Decrease saturated and trans fats by following meal plan. Recheck in 2 months.   Lab Results  Component Value Date   CHOL 171 12/18/2020   HDL 53 12/18/2020   LDLCALC 103 (H) 12/18/2020   TRIG 78 12/18/2020   CHOLHDL  3.8 02/21/2019   Lab Results  Component Value Date   ALT 16 12/18/2020   AST 17 12/18/2020   ALKPHOS 120 12/18/2020   BILITOT 0.4 12/18/2020   The 10-year ASCVD risk score [name], et al., 2013) is: 38.7%   Values used to calculate the score:     Age: 17 years     Sex: Female     Is Non-Hispanic African American: Yes     Diabetic: Yes     Tobacco smoker: No     Systolic Blood Pressure: [id number] mmHg     Is BP treated: Yes     HDL Cholesterol: 53 mg/dL     Total Cholesterol: 171 mg/dL  Start- rosuvastatin (CRESTOR) 40 MG tablet; Take 1 tablet (40 mg total) by mouth at bedtime.  Dispense: 30 tablet; Refill: 0  4. Other secondary osteoarthritis of hip, unspecified laterality Discussed labs with patient today. Pt has increased pain today, which per pt, always causes her BP to go up.   Plan: Follow up with Ortho for pain management on hip and recommendations regarding activity restrictions or not. Follow BP closely.  5. Vitamin D deficiency Discussed labs with patient today. Not at goal. Medication: Centrum Silver Multivitamin QD.  Plan: Plan: - Discussed importance of vitamin D to their health and well-being.  - possible symptoms of low Vitamin D can be low energy, depressed mood, muscle aches, joint aches, osteoporosis etc. - low Vitamin D levels may be linked to an increased risk of cardiovascular events and even increased risk of cancers- such as colon and breast.  - I recommend pt take a 50,000 IU weekly prescription vit D - see script below   - Informed patient this may be a lifelong thing, and she was encouraged to continue to take the medicine until told otherwise.   - we will need to monitor levels regularly (every 3-4 mo on average) to keep levels within normal limits.  - weight loss will likely improve availability of vitamin D, thus encouraged [name] to continue with meal plan and their weight loss efforts to further improve this condition - pt's questions and concerns  regarding this condition addressed.  Lab Results  Component Value Date   VD25OH 23.0 (L) 12/18/2020   Start- Vitamin D, Ergocalciferol, (DRISDOL) 1.25 MG (50000 UNIT) CAPS capsule; Take 1 capsule (50,000 Units total) by mouth every 7 (seven) days.  Dispense: 4 capsule; Refill: 0  6. Obesity with current BMI of 42.9  Trecia is currently in the action stage of change. As such, her goal is to continue with weight loss efforts. She has agreed to the Category 2 Plan.   Exercise goals:  As is  Behavioral modification strategies: meal planning and cooking strategies, travel eating strategies, and planning for success.  Zaraya has agreed to follow-up with our clinic in 2 weeks. She was informed of the importance of frequent follow-up  visits to maximize her success with intensive lifestyle modifications for her multiple health conditions.   Objective:   Blood pressure (!) 171/95, pulse 65, temperature 98.8 F (37.1 C), height '5\' 7"'$  (1.702 m), weight 273 lb (123.8 kg), SpO2 99 %. Body mass index is 42.76 kg/m.  General: Cooperative, alert, well developed, in no acute distress. HEENT: Conjunctivae and lids unremarkable. Cardiovascular: Regular rhythm.  Lungs: Normal work of breathing. Neurologic: No focal deficits.   Lab Results  Component Value Date   CREATININE 0.57 12/18/2020   BUN 14 12/18/2020   NA 141 12/18/2020   K 4.0 12/18/2020   CL 102 12/18/2020   CO2 23 12/18/2020   Lab Results  Component Value Date   ALT 16 12/18/2020   AST 17 12/18/2020   ALKPHOS 120 12/18/2020   BILITOT 0.4 12/18/2020   Lab Results  Component Value Date   HGBA1C 7.6 (H) 12/18/2020   HGBA1C 7.5 (A) 09/26/2020   HGBA1C 7.9 (A) 08/20/2019   HGBA1C 8.6 (A) 02/21/2019   HGBA1C 7.1 11/04/2016   Lab Results  Component Value Date   INSULIN 12.1 12/18/2020   Lab Results  Component Value Date   TSH 1.650 12/18/2020   Lab Results  Component Value Date   CHOL 171 12/18/2020   HDL 53 12/18/2020    LDLCALC 103 (H) 12/18/2020   TRIG 78 12/18/2020   CHOLHDL 3.8 02/21/2019   Lab Results  Component Value Date   VD25OH 23.0 (L) 12/18/2020   Lab Results  Component Value Date   WBC 4.3 12/18/2020   HGB 13.4 12/18/2020   HCT 42.1 12/18/2020   MCV 89 12/18/2020   PLT 243 02/21/2019   No results found for: IRON, TIBC, FERRITIN  Obesity Behavioral Intervention:   Approximately 15 minutes were spent on the discussion below.  ASK: We discussed the diagnosis of obesity with [name] today and Mirha agreed to give [place] permission to discuss obesity behavioral modification therapy today.  ASSESS: Jeneanne has the diagnosis of obesity and her BMI today is 42.9. Aleya is in the action stage of change.   ADVISE: Jullian was educated on the multiple health risks of obesity as well as the benefit of weight loss to improve her health. She was advised of the need for long term treatment and the importance of lifestyle modifications to improve her current health and to decrease her risk of future health problems.  AGREE: Multiple dietary modification options and treatment options were discussed and Lafonda agreed to follow the recommendations documented in the above note.  ARRANGE: Lovene was educated on the importance of frequent visits to treat obesity as outlined per CMS and USPSTF guidelines and agreed to schedule her next follow up appointment today.  Attestation Statements:   Reviewed by clinician on day of visit: allergies, medications, problem list, medical history, surgical history, family history, social history, and previous encounter notes.  Coral Ceo, CMA, am acting as transcriptionist for [employer], DO.  I have reviewed the above documentation for accuracy and completeness, and I agree with the above. Marjory Sneddon, D.O.  The [place] was signed into law in 2016 which includes the topic of electronic health records.  This provides immediate access to  information in MyChart.  This includes consultation notes, operative notes, office notes, lab results and pathology reports.  If you have any questions about what you read please let us know at your next visit so we can discuss your concerns  and take corrective action if need be.  We are right here with you.

## 2021-01-18 ENCOUNTER — Encounter (HOSPITAL_COMMUNITY): Payer: Self-pay | Admitting: Emergency Medicine

## 2021-01-18 ENCOUNTER — Emergency Department (HOSPITAL_COMMUNITY)
Admission: EM | Admit: 2021-01-18 | Discharge: 2021-01-18 | Disposition: A | Discharge status: Home / Self Care | Payer: Medicare PPO | Attending: Emergency Medicine | Admitting: Emergency Medicine

## 2021-01-18 ENCOUNTER — Emergency Department (HOSPITAL_COMMUNITY): Payer: Medicare PPO

## 2021-01-18 ENCOUNTER — Other Ambulatory Visit: Payer: Self-pay

## 2021-01-18 DIAGNOSIS — Z96651 Presence of right artificial knee joint: Secondary | ICD-10-CM | POA: Diagnosis not present

## 2021-01-18 DIAGNOSIS — J45909 Unspecified asthma, uncomplicated: Secondary | ICD-10-CM | POA: Diagnosis not present

## 2021-01-18 DIAGNOSIS — E1122 Type 2 diabetes mellitus with diabetic chronic kidney disease: Secondary | ICD-10-CM | POA: Insufficient documentation

## 2021-01-18 DIAGNOSIS — Z79899 Other long term (current) drug therapy: Secondary | ICD-10-CM | POA: Diagnosis not present

## 2021-01-18 DIAGNOSIS — Z7951 Long term (current) use of inhaled steroids: Secondary | ICD-10-CM | POA: Insufficient documentation

## 2021-01-18 DIAGNOSIS — R079 Chest pain, unspecified: Secondary | ICD-10-CM | POA: Diagnosis not present

## 2021-01-18 DIAGNOSIS — I130 Hypertensive heart and chronic kidney disease with heart failure and stage 1 through stage 4 chronic kidney disease, or unspecified chronic kidney disease: Secondary | ICD-10-CM | POA: Diagnosis not present

## 2021-01-18 DIAGNOSIS — I2510 Atherosclerotic heart disease of native coronary artery without angina pectoris: Secondary | ICD-10-CM | POA: Insufficient documentation

## 2021-01-18 DIAGNOSIS — M940 Chondrocostal junction syndrome [Tietze]: Secondary | ICD-10-CM | POA: Diagnosis not present

## 2021-01-18 DIAGNOSIS — R0789 Other chest pain: Secondary | ICD-10-CM | POA: Diagnosis not present

## 2021-01-18 DIAGNOSIS — I509 Heart failure, unspecified: Secondary | ICD-10-CM | POA: Diagnosis not present

## 2021-01-18 DIAGNOSIS — E1169 Type 2 diabetes mellitus with other specified complication: Secondary | ICD-10-CM | POA: Diagnosis not present

## 2021-01-18 DIAGNOSIS — E785 Hyperlipidemia, unspecified: Secondary | ICD-10-CM | POA: Insufficient documentation

## 2021-01-18 DIAGNOSIS — N189 Chronic kidney disease, unspecified: Secondary | ICD-10-CM | POA: Diagnosis not present

## 2021-01-18 DIAGNOSIS — Z87891 Personal history of nicotine dependence: Secondary | ICD-10-CM | POA: Diagnosis not present

## 2021-01-18 DIAGNOSIS — M7918 Myalgia, other site: Secondary | ICD-10-CM

## 2021-01-18 LAB — CBC
HCT: 41.4 % (ref 36.0–46.0)
Hemoglobin: 13.2 g/dL (ref 12.0–15.0)
MCH: 28.7 pg (ref 26.0–34.0)
MCHC: 31.9 g/dL (ref 30.0–36.0)
MCV: 90 fL (ref 80.0–100.0)
Platelets: 260 10*3/uL (ref 150–400)
RBC: 4.6 MIL/uL (ref 3.87–5.11)
RDW: 14.3 % (ref 11.5–15.5)
WBC: 6 10*3/uL (ref 4.0–10.5)
nRBC: 0 % (ref 0.0–0.2)

## 2021-01-18 LAB — BASIC METABOLIC PANEL
Anion gap: 8 (ref 5–15)
BUN: 21 mg/dL (ref 8–23)
CO2: 23 mmol/L (ref 22–32)
Calcium: 9.4 mg/dL (ref 8.9–10.3)
Chloride: 105 mmol/L (ref 98–111)
Creatinine, Ser: 0.61 mg/dL (ref 0.44–1.00)
GFR, Estimated: >60 mL/min (ref >60)
Glucose, Bld: 143 mg/dL — ABNORMAL HIGH (ref 70–99)
Potassium: 3.6 mmol/L (ref 3.5–5.1)
Sodium: 136 mmol/L (ref 135–145)

## 2021-01-18 LAB — TROPONIN I (HIGH SENSITIVITY): Troponin I (High Sensitivity): 6 ng/L (ref <18)

## 2021-01-18 MED ORDER — ACETAMINOPHEN 325 MG PO TABS
650.0000 mg | ORAL_TABLET | Freq: Once | ORAL | Status: AC
  Administered 2021-01-18: 650 mg via ORAL
  Filled 2021-01-18: qty 2

## 2021-01-18 NOTE — ED Triage Notes (Signed)
Pt reports L sided chest pain with movement x 1.5 hours.  Denies SOB, nausea, and vomiting.  States it is a cramping pain.
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
## 2021-01-18 NOTE — ED Provider Notes (Signed)
Emergency Medicine Provider Triage Evaluation Note  Sandra Lawrence , a 75 y.o. female  was evaluated in triage.  Pt complains of non-radiating left chest pain that started suddenly this morning with episode of diaphoresis. Describes it as a cramp and worse with movement. Denies nausea with episode. Took nitroglycerin pill this morning without relief.   Review of Systems  Positive: Chest pain, diaphoresis Negative: Nausea/vomiting, shortness of breath, dizziness, lightheadedness  Physical Exam  BP (!) 162/72 (BP Location: Left Arm)   Pulse 60   Temp 98.3 F (36.8 C) (Oral)   Resp 16   SpO2 97%  Gen:   Awake, no distress, alert and oriented Cardiac: S1/S2 without murmurs, rubs, gallops. No TTP of chest wall  Resp:  Normal effort, bilateral breath sounds clear  MSK:   Moves extremities without difficulty   Medical Decision Making  Medically screening exam initiated at 10:02 AM.  Appropriate orders placed.  RAZIYAH IMBERT was informed that the remainder of the evaluation will be completed by another provider, this initial triage assessment does not replace that evaluation, and the importance of remaining in the ED until their evaluation is complete.     Mickie Hillier, PA-C 01/18/21 1006    Luna Fuse, MD 01/21/21 [phone]

## 2021-01-20 ENCOUNTER — Telehealth: Payer: Self-pay | Admitting: Family Medicine

## 2021-01-20 NOTE — Telephone Encounter (Signed)
Pt was called concerning recent er visit. Pt states she is in pain but will call ortho today due to issue is in hip which they are treating her for.

## 2021-01-21 ENCOUNTER — Ambulatory Visit: Payer: Medicare PPO | Admitting: Family Medicine

## 2021-01-24 ENCOUNTER — Other Ambulatory Visit: Payer: Self-pay

## 2021-01-24 ENCOUNTER — Ambulatory Visit (INDEPENDENT_AMBULATORY_CARE_PROVIDER_SITE_OTHER): Payer: Medicare PPO

## 2021-01-24 DIAGNOSIS — Z0000 Encounter for general adult medical examination without abnormal findings: Secondary | ICD-10-CM
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
## 2021-01-27 ENCOUNTER — Other Ambulatory Visit: Payer: Self-pay

## 2021-01-27 ENCOUNTER — Ambulatory Visit (INDEPENDENT_AMBULATORY_CARE_PROVIDER_SITE_OTHER): Payer: Medicare PPO | Admitting: Physician Assistant

## 2021-01-27 ENCOUNTER — Encounter (INDEPENDENT_AMBULATORY_CARE_PROVIDER_SITE_OTHER): Payer: Self-pay | Admitting: Physician Assistant

## 2021-01-27 VITALS — BP 154/74 | HR 55 | Temp 98.3°F | Ht 67.0 in | Wt 266.0 lb

## 2021-01-27 DIAGNOSIS — Z6841 Body Mass Index (BMI) 40.0 and over, adult: Secondary | ICD-10-CM | POA: Diagnosis not present

## 2021-01-27 DIAGNOSIS — E559 Vitamin D deficiency, unspecified: Secondary | ICD-10-CM

## 2021-01-27 DIAGNOSIS — E782 Mixed hyperlipidemia: Secondary | ICD-10-CM | POA: Diagnosis not present

## 2021-01-27 DIAGNOSIS — E1169 Type 2 diabetes mellitus with other specified complication: Secondary | ICD-10-CM | POA: Diagnosis not present

## 2021-01-27 MED ORDER — VITAMIN D (ERGOCALCIFEROL) 1.25 MG (50000 UNIT) PO CAPS: 50000.0000 [IU] | ORAL_CAPSULE | ORAL | 0 refills | Status: DC

## 2021-01-27 NOTE — Progress Notes (Signed)
Chief Complaint:   OBESITY Stassi is here to discuss her progress with her obesity treatment plan along with follow-up of her obesity related diagnoses. Rashaun is on the Category 2 Plan and states she is following her eating plan approximately 95% of the time. Anyssia states she is not exercising regularly.  Today's visit was #: 3 Starting weight: 286 lbs Starting date: 12/18/2020 Today's weight: 266 lbs Today's date: 01/27/2021 Total lbs lost to date: 20 lbs Total lbs lost since last in-office visit: 7 lbs  Interim History: Kassondra continues to do well with weight loss.  She reports that she is feeling better overall.  She is eating all 6-8 ounces of protein at dinner, but wants to switch lunch and dinner meal timing.  She is going on a cruise coming up in the next week.  Subjective:   1. Vitamin D deficiency She is currently taking vitamin D. She denies nausea, vomiting or muscle weakness.  Lab Results  Component Value Date   VD25OH 23.0 (L) 12/18/2020   2. Type 2 diabetes mellitus with other specified complication, without long-term current use of insulin (HCC) Last A1c 7.6.  No medications currently.  Does not check blood sugar at home.  Managed by PCP.  Lab Results  Component Value Date   HGBA1C 7.6 (H) 12/18/2020   HGBA1C 7.5 (A) 09/26/2020   HGBA1C 7.9 (A) 08/20/2019   Lab Results  Component Value Date   MICROALBUR 24.1 08/20/2019   LDLCALC 103 (H) 12/18/2020   CREATININE 0.61 01/18/2021   Lab Results  Component Value Date   INSULIN 12.1 12/18/2020   Assessment/Plan:   1. Vitamin D deficiency Refill vitamin D 50,000 IU weekly, as per below.  - Refill Vitamin D, Ergocalciferol, (DRISDOL) 1.25 MG (50000 UNIT) CAPS capsule; Take 1 capsule (50,000 Units total) by mouth every 7 (seven) days.  Dispense: 4 capsule; Refill: 0  2. Type 2 diabetes mellitus with other specified complication, without long-term current use of insulin ([place]) Follow-up with PCP.   3.  Obesity with current BMI of 41.8  Hermione is currently in the action stage of change. As such, her goal is to continue with weight loss efforts. She has agreed to the Category 2 Plan.   Exercise goals: Older adults should follow the adult guidelines. When older adults cannot meet the adult guidelines, they should be as physically active as their abilities and conditions will allow.  Older adults should do exercises that maintain or improve balance if they are at risk of falling.   Behavioral modification strategies: meal planning and cooking strategies, keeping healthy foods in the home, and travel eating strategies.  Muranda has agreed to follow-up with our clinic in 2 weeks. She was informed of the importance of frequent follow-up visits to maximize her success with intensive lifestyle modifications for her multiple health conditions.   Objective:   Blood pressure (!) 154/74, pulse (!) 55, temperature 98.3 F (36.8 C), height '5\' 7"'$  (1.702 m), weight 266 lb (120.7 kg), SpO2 98 %. Body mass index is 41.66 kg/m.  General: Cooperative, alert, well developed, in no acute distress. HEENT: Conjunctivae and lids unremarkable. Cardiovascular: Regular rhythm.  Lungs: Normal work of breathing. Neurologic: No focal deficits.   Lab Results  Component Value Date   CREATININE 0.61 01/18/2021   BUN 21 01/18/2021   NA 136 01/18/2021   K 3.6 01/18/2021   CL 105 01/18/2021   CO2 23 01/18/2021   Lab Results  Component Value Date  ALT 16 12/18/2020   AST 17 12/18/2020   ALKPHOS 120 12/18/2020   BILITOT 0.4 12/18/2020   Lab Results  Component Value Date   HGBA1C 7.6 (H) 12/18/2020   HGBA1C 7.5 (A) 09/26/2020   HGBA1C 7.9 (A) 08/20/2019   HGBA1C 8.6 (A) 02/21/2019   HGBA1C 7.1 11/04/2016   Lab Results  Component Value Date   INSULIN 12.1 12/18/2020   Lab Results  Component Value Date   TSH 1.650 12/18/2020   Lab Results  Component Value Date   CHOL 171 12/18/2020   HDL 53 12/18/2020    LDLCALC 103 (H) 12/18/2020   TRIG 78 12/18/2020   CHOLHDL 3.8 02/21/2019   Lab Results  Component Value Date   VD25OH 23.0 (L) 12/18/2020   Lab Results  Component Value Date   WBC 6.0 01/18/2021   HGB 13.2 01/18/2021   HCT 41.4 01/18/2021   MCV 90.0 01/18/2021   PLT 260 01/18/2021   Obesity Behavioral Intervention:   Approximately 15 minutes were spent on the discussion below.  ASK: We discussed the diagnosis of obesity with [name] today and Elin agreed to give [place] permission to discuss obesity behavioral modification therapy today.  ASSESS: Wyetta has the diagnosis of obesity and her BMI today is 41.8. Audryanna is in the action stage of change.   ADVISE: Kiannah was educated on the multiple health risks of obesity as well as the benefit of weight loss to improve her health. She was advised of the need for long term treatment and the importance of lifestyle modifications to improve her current health and to decrease her risk of future health problems.  AGREE: Multiple dietary modification options and treatment options were discussed and Sophee agreed to follow the recommendations documented in the above note.  ARRANGE: Tecla was educated on the importance of frequent visits to treat obesity as outlined per CMS and USPSTF guidelines and agreed to schedule her next follow up appointment today.  Attestation Statements:   Reviewed by clinician on day of visit: allergies, medications, problem list, medical history, surgical history, family history, social history, and previous encounter notes.  I, Water quality scientist, CMA, am acting as transcriptionist for Abby Potash, PA-C  I have reviewed the above documentation for accuracy and completeness, and I agree with the above. Abby Potash, PA-C

## 2021-01-29 ENCOUNTER — Other Ambulatory Visit: Payer: Self-pay | Admitting: Family Medicine

## 2021-01-29 DIAGNOSIS — I119 Hypertensive heart disease without heart failure: Secondary | ICD-10-CM

## 2021-01-29 DIAGNOSIS — E1169 Type 2 diabetes mellitus with other specified complication: Secondary | ICD-10-CM

## 2021-02-04 ENCOUNTER — Other Ambulatory Visit (INDEPENDENT_AMBULATORY_CARE_PROVIDER_SITE_OTHER): Payer: Self-pay | Admitting: Family Medicine

## 2021-02-04 DIAGNOSIS — E1169 Type 2 diabetes mellitus with other specified complication: Secondary | ICD-10-CM

## 2021-02-04 DIAGNOSIS — E782 Mixed hyperlipidemia: Secondary | ICD-10-CM

## 2021-02-04 NOTE — Telephone Encounter (Signed)
Last OV with Dr Opalski 

## 2021-02-10 ENCOUNTER — Other Ambulatory Visit: Payer: Self-pay

## 2021-02-10 ENCOUNTER — Encounter (INDEPENDENT_AMBULATORY_CARE_PROVIDER_SITE_OTHER): Payer: Self-pay | Admitting: Family Medicine

## 2021-02-10 ENCOUNTER — Ambulatory Visit (INDEPENDENT_AMBULATORY_CARE_PROVIDER_SITE_OTHER): Payer: Medicare PPO | Admitting: Family Medicine

## 2021-02-10 VITALS — BP 169/81 | HR 62 | Temp 97.5°F | Ht 67.0 in | Wt 271.0 lb

## 2021-02-10 DIAGNOSIS — E1169 Type 2 diabetes mellitus with other specified complication: Secondary | ICD-10-CM

## 2021-02-10 DIAGNOSIS — E1159 Type 2 diabetes mellitus with other circulatory complications: Secondary | ICD-10-CM | POA: Diagnosis not present

## 2021-02-10 DIAGNOSIS — E559 Vitamin D deficiency, unspecified: Secondary | ICD-10-CM

## 2021-02-10 DIAGNOSIS — E782 Mixed hyperlipidemia: Secondary | ICD-10-CM

## 2021-02-10 DIAGNOSIS — Z6841 Body Mass Index (BMI) 40.0 and over, adult: Secondary | ICD-10-CM

## 2021-02-10 DIAGNOSIS — I152 Hypertension secondary to endocrine disorders: Secondary | ICD-10-CM

## 2021-02-10 MED ORDER — ROSUVASTATIN CALCIUM 40 MG PO TABS: 40.0000 mg | ORAL_TABLET | Freq: Every day | ORAL | 0 refills | Status: DC

## 2021-02-10 MED ORDER — VITAMIN D (ERGOCALCIFEROL) 1.25 MG (50000 UNIT) PO CAPS: 50000.0000 [IU] | ORAL_CAPSULE | ORAL | 0 refills | Status: DC

## 2021-02-10 NOTE — Progress Notes (Signed)
Chief Complaint:   OBESITY Sandra Lawrence is here to discuss her progress with her obesity treatment plan along with follow-up of her obesity related diagnoses. Sandra Lawrence is on the Category 2 Plan and states she is following her eating plan approximately 70% of the time. Sandra Lawrence states she is not currently exercising.  Today's visit was #: 4 Starting weight: 286 lbs Starting date: 12/18/2020 Today's weight: 271 lbs Today's date: 02/10/2021 Total lbs lost to date: 15 Total lbs lost since last in-office visit: +5  Interim History: Sandra Lawrence was on a cruise for 8 days and did not eat on plan. Pt started back on her plan last night. She has all foods and has no issues with plan.  Subjective:   1. Mixed diabetic hyperlipidemia associated with type 2 diabetes mellitus (Sandra Lawrence) Pt was started on Crestor on 01/12/2021 (changed to stronger statin) and is tolerating it well with no side effects.  2. Hypertension associated with type 2 diabetes mellitus (Sandra Lawrence) Sandra Lawrence has no way of checking BP at home, as she has no monitor but she has hip pain and says it causes elevated BP.  3. Vitamin D deficiency Sandra Lawrence is tolerating medication(s) well without side effects.  Medication compliance is good and patient appears to be taking it as prescribed.  Denies additional concerns regarding this condition.   Assessment/Plan:  No orders of the defined types were placed in this encounter.   Medications Discontinued During This Encounter  Medication Reason   rosuvastatin (CRESTOR) 40 MG tablet Reorder   Vitamin D, Ergocalciferol, (DRISDOL) 1.25 MG (50000 UNIT) CAPS capsule Reorder     Meds ordered this encounter  Medications   rosuvastatin (CRESTOR) 40 MG tablet    Sig: Take 1 tablet (40 mg total) by mouth at bedtime.    Dispense:  30 tablet    Refill:  0    Ov for RF   Vitamin D, Ergocalciferol, (DRISDOL) 1.25 MG (50000 UNIT) CAPS capsule    Sig: Take 1 capsule (50,000 Units total) by mouth every 7 (seven)  days.    Dispense:  4 capsule    Refill:  0    Ov for RF; 30 d supply     1. Mixed diabetic hyperlipidemia associated with type 2 diabetes mellitus (Sandra Lawrence) Cardiovascular risk and specific lipid/LDL goals reviewed.  We discussed several lifestyle modifications today and Sandra Lawrence will continue to work on diet, exercise and weight loss efforts. Orders and follow up as documented in patient record. Continue current treatment plan.  Counseling Intensive lifestyle modifications are the first line treatment for this issue. Dietary changes: Increase soluble fiber. Decrease simple carbohydrates. Exercise changes: Moderate to vigorous-intensity aerobic activity 150 minutes per week if tolerated. Lipid-lowering medications: see documented in medical record.  Refill- rosuvastatin (CRESTOR) 40 MG tablet; Take 1 tablet (40 mg total) by mouth at bedtime.  Dispense: 30 tablet; Refill: 0  2. Hypertension associated with type 2 diabetes mellitus (Sandra Lawrence) Sandra Lawrence is working on healthy weight loss and exercise to improve blood pressure control. We will watch for signs of hypotension as she continues her lifestyle modifications. Decrease salt intake, look to control pain better with ice/NSAIDS, and check BP at home. If BP remains elevated, pt needs to follow up with PCP sooner than planned. Pt is unable to take Norvasc due to side effects.  3. Vitamin D deficiency Low Vitamin D level contributes to fatigue and are associated with obesity, breast, and colon cancer. She agrees to continue to take prescription  Vitamin D 50,000 IU every week and will follow-up for routine testing of Vitamin D, at least 2-3 times per year to avoid over-replacement.  Refill- Vitamin D, Ergocalciferol, (DRISDOL) 1.25 MG (50000 UNIT) CAPS capsule; Take 1 capsule (50,000 Units total) by mouth every 7 (seven) days.  Dispense: 4 capsule; Refill: 0  4. Obesity with current BMI of 42.5  Sandra Lawrence is currently in the action stage of change. As such, her  goal is to continue with weight loss efforts. She has agreed to the Category 2 Plan.   Exercise goals:  As is  Behavioral modification strategies: meal planning and cooking strategies and planning for success.  Sandra Lawrence has agreed to follow-up with our clinic in 2 weeks. She was informed of the importance of frequent follow-up visits to maximize her success with intensive lifestyle modifications for her multiple health conditions.   Objective:   Blood pressure (!) 169/81, pulse 62, temperature (!) 97.5 F (36.4 C), height 5\' 7"  (1.702 m), weight 271 lb (122.9 kg), SpO2 98 %. Body mass index is 42.44 kg/m.  General: Cooperative, alert, well developed, in no acute distress. HEENT: Conjunctivae and lids unremarkable. Cardiovascular: Regular rhythm.  Lungs: Normal work of breathing. Neurologic: No focal deficits.   Lab Results  Component Value Date   CREATININE 0.61 01/18/2021   BUN 21 01/18/2021   NA 136 01/18/2021   K 3.6 01/18/2021   CL 105 01/18/2021   CO2 23 01/18/2021   Lab Results  Component Value Date   ALT 16 12/18/2020   AST 17 12/18/2020   ALKPHOS 120 12/18/2020   BILITOT 0.4 12/18/2020   Lab Results  Component Value Date   HGBA1C 7.6 (H) 12/18/2020   HGBA1C 7.5 (A) 09/26/2020   HGBA1C 7.9 (A) 08/20/2019   HGBA1C 8.6 (A) 02/21/2019   HGBA1C 7.1 11/04/2016   Lab Results  Component Value Date   INSULIN 12.1 12/18/2020   Lab Results  Component Value Date   TSH 1.650 12/18/2020   Lab Results  Component Value Date   CHOL 171 12/18/2020   HDL 53 12/18/2020   LDLCALC 103 (H) 12/18/2020   TRIG 78 12/18/2020   CHOLHDL 3.8 02/21/2019   Lab Results  Component Value Date   VD25OH 23.0 (L) 12/18/2020   Lab Results  Component Value Date   WBC 6.0 01/18/2021   HGB 13.2 01/18/2021   HCT 41.4 01/18/2021   MCV 90.0 01/18/2021   PLT 260 01/18/2021   No results found for: IRON, TIBC, FERRITIN  Obesity Behavioral Intervention:   Approximately 15 minutes  were spent on the discussion below.  ASK: We discussed the diagnosis of obesity with Sandra Lawrence today and Sandra Lawrence agreed to give [place] permission to discuss obesity behavioral modification therapy today.  ASSESS: Bostyn has the diagnosis of obesity and her BMI today is 42.5. Thelia is in the action stage of change.   ADVISE: Ester was educated on the multiple health risks of obesity as well as the benefit of weight loss to improve her health. She was advised of the need for long term treatment and the importance of lifestyle modifications to improve her current health and to decrease her risk of future health problems.  AGREE: Multiple dietary modification options and treatment options were discussed and Terryn agreed to follow the recommendations documented in the above note.  ARRANGE: Saesha was educated on the importance of frequent visits to treat obesity as outlined per CMS and USPSTF guidelines and agreed to schedule her next follow up  appointment today.  Attestation Statements:   Reviewed by clinician on day of visit: allergies, medications, problem list, medical history, surgical history, family history, social history, and previous encounter notes.  Coral Ceo, CMA, am acting as transcriptionist for [employer], DO.  I have reviewed the above documentation for accuracy and completeness, and I agree with the above. Marjory Sneddon, D.O.  The [place] was signed into law in 2016 which includes the topic of electronic health records.  This provides immediate access to information in MyChart.  This includes consultation notes, operative notes, office notes, lab results and pathology reports.  If you have any questions about what you read please let us know at your next visit so we can discuss your concerns and take corrective action if need be.  We are right here with you.

## 2021-02-16 ENCOUNTER — Ambulatory Visit: Payer: Medicare PPO | Admitting: Family Medicine

## 2021-02-16 ENCOUNTER — Other Ambulatory Visit: Payer: Self-pay

## 2021-02-16 VITALS — BP 134/82 | HR 56 | Temp 97.2°F | Wt 272.4 lb

## 2021-02-16 DIAGNOSIS — E1159 Type 2 diabetes mellitus with other circulatory complications: Secondary | ICD-10-CM | POA: Diagnosis not present

## 2021-02-16 DIAGNOSIS — E559 Vitamin D deficiency, unspecified: Secondary | ICD-10-CM | POA: Diagnosis not present

## 2021-02-16 DIAGNOSIS — Z23 Encounter for immunization: Secondary | ICD-10-CM | POA: Diagnosis not present

## 2021-02-16 DIAGNOSIS — I119 Hypertensive heart disease without heart failure: Secondary | ICD-10-CM

## 2021-02-16 DIAGNOSIS — E1169 Type 2 diabetes mellitus with other specified complication: Secondary | ICD-10-CM

## 2021-02-16 DIAGNOSIS — Z6841 Body Mass Index (BMI) 40.0 and over, adult: Secondary | ICD-10-CM

## 2021-02-16 DIAGNOSIS — M1610 Unilateral primary osteoarthritis, unspecified hip: Secondary | ICD-10-CM

## 2021-02-16 DIAGNOSIS — I152 Hypertension secondary to endocrine disorders: Secondary | ICD-10-CM | POA: Diagnosis not present

## 2021-02-16 DIAGNOSIS — E782 Mixed hyperlipidemia: Secondary | ICD-10-CM | POA: Diagnosis not present

## 2021-02-16 MED ORDER — LOSARTAN POTASSIUM-HCTZ 100-12.5 MG PO TABS: 1.0000 | ORAL_TABLET | Freq: Every day | ORAL | 3 refills | Status: DC

## 2021-02-16 MED ORDER — TRULICITY 0.75 MG/0.5ML ~~LOC~~ SOAJ: 0.7500 mg | SUBCUTANEOUS | 1 refills | Status: DC

## 2021-02-16 NOTE — Progress Notes (Signed)
Subjective:    Patient ID: Sandra Lawrence, female    DOB: [birth year], 75 y.o.   MRN: [id number]  Sandra Lawrence is a 75 y.o. female who presents for follow-up of Type 2 diabetes mellitus.  Home blood sugar records:  not checking Current symptoms/problems include none and have been stable. Daily foot checks:yes   Any foot concerns: loss of feeling in heel  Exercise: The patient does not participate in regular exercise at present. Diet: She is following recommendations of medical weight loss and wellness.  She has lost weight. She does have right hip arthritis and is losing weight to help qualify to get the hip replacement.  This seems to be the big intent for her to move forward with this.  The arthritis is interfering with her ability to accomplish her ADLs.  She will probably need help with housework although she says she cannot cook she might also need help with bathing since there is a fear of falling because of the hip pain.  She would also like to be placed back on Trulicity although she states in the past that it caused her to gain some weight.  She is intolerant to metformin and Farxiga caused yeast infection so she has not been able to take that.  She continues on Crestor.  She is also taking a good multivitamin as well as metoprolol and losartan.  Recent hemoglobin A1c was 7.6 The following portions of the patient's history were reviewed and updated as appropriate: allergies, current medications, past medical history, past social history and problem list.  ROS as in subjective above.     Objective:    Physical Exam Alert and in no distress otherwise not examined.    Lab Review Diabetic Labs Latest Ref Rng & Units 01/18/2021 12/18/2020 09/26/2020 08/20/2019 02/21/2019  HbA1c 4.8 - 5.6 % - 7.6(H) 7.5(A) 7.9(A) 8.6(A)  Microalbumin mg/L - - - 24.1 -  Micro/Creat Ratio - - - - 20.3 -  Chol 100 - 199 mg/dL - 171 - - 192  HDL >39 mg/dL - 53 - - 51  Calc LDL 0 - 99 mg/dL - 103(H) - - 128(H)   Triglycerides 0 - 149 mg/dL - 78 - - 71  Creatinine 0.44 - 1.00 mg/dL 0.61 0.57 - - 0.80   BP/Weight 02/10/2021 01/27/2021 01/18/2021 3/71/0626 01/18/8545  Systolic BP 270 350 093 818 299  Diastolic BP 81 74 75 95 68  Wt. (Lbs) 271 266 270 273 286  BMI 42.44 41.66 42.29 42.76 44.79   Foot/eye exam completion dates Latest Ref Rng & Units 10/30/2020 11/04/2016  Eye Exam No Retinopathy No Retinopathy -  Foot Form Completion - - Done    [name]  reports that she quit smoking about 41 years ago. Her smoking use included cigarettes. She has never used smokeless tobacco. She reports that she does not currently use alcohol. She reports that she does not use drugs.     Assessment & Plan:  Type 2 diabetes mellitus with other specified complication, without long-term current use of insulin (HCC) - Plan: POCT glycosylated hemoglobin (Hb A1C), Dulaglutide (TRULICITY) 3.71 IR/6.7EL SOPN  Hypertension associated with type 2 diabetes mellitus ([place]) - Plan: losartan-hydrochlorothiazide (HYZAAR) 100-12.5 MG tablet  Mixed diabetic hyperlipidemia associated with type 2 diabetes mellitus (HCC)  Vitamin D deficiency  Morbid obesity with BMI of 40.0-44.9, adult ([place]) - Plan: AMB Referral to [facility]  Need for influenza vaccination - Plan: Flu Vaccine QUAD High Dose(Fluad)  Arthritis of  hip - Plan: AMB Referral to [facility]  Hypertensive heart disease without CHF - Plan: losartan-hydrochlorothiazide (HYZAAR) 100-12.5 MG tablet No diagnosis found.  Rx changes:  Trulicity Education: Reviewed 'ABCs' of diabetes management (respective goals in parentheses):  A1C (<7), blood pressure (<130/80), and cholesterol (LDL <100). Compliance at present is estimated to be good. Efforts to improve compliance (if necessary) will be directed at  continue with present dietary changes and exercise is much as possible. . Follow up: Her Trulicity might need to be readjusted in roughly 3 to 4 months.  She can  either do it through me or through medical weight loss.

## 2021-02-18 ENCOUNTER — Other Ambulatory Visit: Payer: Self-pay

## 2021-02-18 NOTE — Patient Instructions (Signed)
Goals Addressed               This Visit's Progress     Monitor and Manage My Blood Sugar-Diabetes Type 2 (pt-stated)        Barriers: Health Behaviors Timeframe:  Long-Range Goal Priority:  High Start Date:     02/18/21                        Expected End Date:   11/13/21                    Follow Up Date 04/15/22   - check blood sugar at prescribed times - enter blood sugar readings and medication or insulin into daily log - take the blood sugar log to all doctor visits    Why is this important?   Checking your blood sugar at home helps to keep it from getting very high or very low.  Writing the results in a diary or log helps the doctor know how to care for you.  Your blood sugar log should have the time, date and the results.  Also, write down the amount of insulin or other medicine that you take.  Other information, like what you ate, exercise done and how you were feeling, will also be helpful.     Notes: 02/18/21 Patient does not have a meter.  Will get MD to order one.  A1c 7.6 Diabetes Management Discussed: Medication adherence Reviewed importance of limiting carbs such as rice, potatoes, breads, and pastas. Also discussed limiting sweets and sugary drinks.  Discussed importance of portion control.  Also discussed importance of annual exams, foot exams, and eye exams.        Track and Manage My Blood Pressure-Hypertension (pt-stated)        Barriers: Health Behaviors Timeframe:  Long-Range Goal Priority:  High Start Date:   02/18/21                          Expected End Date:  11/13/21                       Follow Up Date  04/15/22    - check blood pressure weekly - write blood pressure results in a log or diary    Why is this important?   You won't feel high blood pressure, but it can still hurt your blood vessels.  High blood pressure can cause heart or kidney problems. It can also cause a stroke.  Making lifestyle changes like losing a little weight or eating  less salt will help.  Checking your blood pressure at home and at different times of the day can help to control blood pressure.  If the doctor prescribes medicine remember to take it the way the doctor ordered.  Call the office if you cannot afford the medicine or if there are questions about it.     Notes: 02/18/21 Patient does not check blood pressure as she has no monitor. CM will send monitor to patient.   Hypertension Management Discussed Take medications as ordered Limit salt intake. Eat plenty of fruits and vegetables Remain as active as possible Take blood pressure at least weekly at home and record in a notebook to take to doctor's visits.

## 2021-02-18 NOTE — Patient Outreach (Signed)
[facility]) Care Management  [place]  02/18/2021   Sandra Lawrence 11-08-1945 [id number]  Subjective: Telephone call to patient for initial call for referral from physician. Patient live alone and has history of arthritis which limits activity. Patient interested in help in the home. Patient income too high for medicaid PCS.  However, patient is interested in possible private hire. Will send list on agencies for patient to share with family.  Patient wanting a lifeline monitor.  Will send [employer] information.   Patient with history of DM, Arthritis, HTN, and chronic back problems. Patient not checking blood pressure or sugars. Will send blood pressure monitor and have MD send in order for blood sugar meter.   Discussed importance of monitoring.  Reviewed medications. Patient has no questions or concerns about medications.    Discussed [facility] care management ongoing support and education.  Patient agreeable to monthly calls right now.    Objective:   Encounter Medications:  Outpatient Encounter Medications as of 02/18/2021  Medication Sig   albuterol (VENTOLIN HFA) 108 (90 Base) MCG/ACT inhaler INHALE 1-2 PUFFS BY MOUTH EVERY 6 HOURS AS NEEDED FOR WHEEZE OR SHORTNESS OF BREATH   Dulaglutide (TRULICITY) 2.68 TM/1.9QQ SOPN Inject 0.75 mg into the skin once a week.   fluticasone (FLOVENT HFA) 44 MCG/ACT inhaler Inhale 2 puffs into the lungs 2 (two) times daily.   ibuprofen (ADVIL) 800 MG tablet Take 800 mg by mouth every 8 (eight) hours as needed.   lidocaine (XYLOCAINE) 5 % ointment Apply up to 2 gms up to 4 times daily prn pain   losartan-hydrochlorothiazide (HYZAAR) 100-12.5 MG tablet Take 1 tablet by mouth daily.   metoprolol tartrate (LOPRESSOR) 50 MG tablet Take 1 tablet (50 mg total) by mouth 2 (two) times daily.   Multiple Vitamins-Minerals (CENTRUM SILVER PO) Take by mouth.   nitroGLYCERIN (NITROSTAT) 0.4 MG SL tablet PLACE 1 TABLET (0.4 MG TOTAL) UNDER THE  TONGUE EVERY 5 (FIVE) MINUTES AS NEEDED FOR CHEST PAIN.   orlistat (XENICAL) 120 MG capsule Take 120 mg by mouth 3 (three) times daily with meals.   rosuvastatin (CRESTOR) 40 MG tablet Take 1 tablet (40 mg total) by mouth at bedtime.   Vitamin D, Ergocalciferol, (DRISDOL) 1.25 MG (50000 UNIT) CAPS capsule Take 1 capsule (50,000 Units total) by mouth every 7 (seven) days.   meloxicam (MOBIC) 15 MG tablet Take 15 mg by mouth daily. (Patient not taking: No sig reported)   [DISCONTINUED] gabapentin (NEURONTIN) 100 MG capsule Take 1 capsule (100 mg total) by mouth 3 (three) times daily. (Patient not taking: Reported on 07/12/2019)   No facility-administered encounter medications on file as of 02/18/2021.    Functional Status: Functional Status Survey: Is the patient deaf or have difficulty hearing?: No Does the patient have difficulty seeing, even when wearing glasses/contacts?: No Does the patient have difficulty concentrating, remembering, or making decisions?: No Does the patient have difficulty walking or climbing stairs?: Yes (arthritis uses rollator) Does the patient have difficulty dressing or bathing?: No Does the patient have difficulty doing errands alone such as visiting a doctor's office or shopping?: Yes (family assists as needed)    Fall/Depression Screening: Fall Risk  02/18/2021 01/24/2021 09/26/2020  Falls in the past year? 1 1 1   Number falls in past yr: 0 0 0  Injury with Fall? 0 0 0  Risk for fall due to : History of fall(s) - Impaired mobility  Follow up Education provided - Falls evaluation completed  PHQ 2/9 Scores 02/18/2021 01/24/2021 12/18/2020 09/26/2020 03/19/2020 12/22/2017 11/04/2016  PHQ - 2 Score 0 0 5 0 0 0 0  PHQ- 9 Score - 0 22 - - - -    Assessment:   Care Plan Care Plan : Diabetes Type 2 (Adult)  Updates made by Jon Billings, RN since 02/18/2021 12:00 AM     Problem: Glycemic Management (Diabetes, Type 2)      Long-Range Goal: Glycemic Management  Optimized as evidenced by A1c less than 7.0   Start Date: 02/18/2021  Expected End Date: 11/13/2021  Priority: High  Note:   Evidence-based guidance:  Anticipate A1C testing (point-of-care) every 3 to 6 months based on goal attainment.  Review mutually-set A1C goal or target range.  Anticipate use of antihyperglycemic with or without insulin and periodic adjustments; consider active involvement of pharmacist.  Provide medical nutrition therapy and development of individualized eating.  Compare self-reported symptoms of hypo or hyperglycemia to blood glucose levels, diet and fluid intake, current medications, psychosocial and physiologic stressors, change in activity and barriers to care adherence.  Promote self-monitoring of blood glucose levels.  Assess and address barriers to management plan, such as food insecurity, age, developmental ability, depression, anxiety, fear of hypoglycemia or weight gain, as well as medication cost, side effects and complicated regimen.  Consider referral to community-based diabetes education program, visiting nurse, community health worker or health coach.  Encourage regular dental care for treatment of periodontal disease; refer to dental provider when needed.   Notes:     Task: Alleviate Barriers to Glycemic Management   Due Date: 11/13/2021  Priority: Routine  Responsible User: Jon Billings, RN  Note:   Care Management Activities:    - blood glucose monitoring encouraged - mutual A1C goal set or reviewed - use of blood glucose monitoring log promoted    Notes: 02/18/21 Patient does not have a monitor.  CM sent message to MD for order to her pharmacy for one.  A1c 7.6 Diabetes Management Discussed: Medication adherence Reviewed importance of limiting carbs such as rice, potatoes, breads, and pastas. Also discussed limiting sweets and sugary drinks.  Discussed importance of portion control.  Also discussed importance of annual exams, foot exams, and eye  exams.       Care Plan : Hypertension (Adult)  Updates made by Jon Billings, RN since 02/18/2021 12:00 AM     Problem: Hypertension (Hypertension)      Long-Range Goal: Hypertension Monitored as evidenced by blood pressure less than 140/80.   Start Date: 02/18/2021  Expected End Date: 11/13/2021  Priority: High  Note:   Evidence-based guidance:  Promote initial use of ambulatory blood pressure measurements (for 3 days) to rule out "white-coat" effect; identify masked hypertension and presence or absence of nocturnal "dipping" of blood pressure.   Encourage continued use of home blood pressure monitoring and recording in blood pressure log; include symptoms of hypotension or potential medication side effects in log.  Review blood pressure measurements taken inside and outside of the provider office; establish baseline and monitor trends; compare to target ranges or patient goal.  Share overall cardiovascular risk with patient; encourage changes to lifestyle risk factors, including alcohol consumption, smoking, inadequate exercise, poor dietary habits and stress.   Notes:     Task: Identify and Monitor Blood Pressure Elevation   Due Date: 11/13/2021  Priority: Routine  Responsible User: Jon Billings, RN  Note:   Care Management Activities:    - depression screen reviewed - home or  ambulatory blood pressure monitoring encouraged    Notes: 02/18/21 Patient does not monitor blood pressure. CM will have monitor ordered.  Encouraged to check weekly.   Hypertension Management Discussed Take medications as ordered Limit salt intake. Eat plenty of fruits and vegetables Remain as active as possible Take blood pressure at least weekly at home and record in a notebook to take to doctor's visits.       Goals Addressed               This Visit's Progress     Monitor and Manage My Blood Sugar-Diabetes Type 2 (pt-stated)        Barriers: Health Behaviors Timeframe:  Long-Range  Goal Priority:  High Start Date:     02/18/21                        Expected End Date:   11/13/21                    Follow Up Date 04/15/22   - check blood sugar at prescribed times - enter blood sugar readings and medication or insulin into daily log - take the blood sugar log to all doctor visits    Why is this important?   Checking your blood sugar at home helps to keep it from getting very high or very low.  Writing the results in a diary or log helps the doctor know how to care for you.  Your blood sugar log should have the time, date and the results.  Also, write down the amount of insulin or other medicine that you take.  Other information, like what you ate, exercise done and how you were feeling, will also be helpful.     Notes: 02/18/21 Patient does not have a meter.  Will get MD to order one.  A1c 7.6 Diabetes Management Discussed: Medication adherence Reviewed importance of limiting carbs such as rice, potatoes, breads, and pastas. Also discussed limiting sweets and sugary drinks.  Discussed importance of portion control.  Also discussed importance of annual exams, foot exams, and eye exams.        Track and Manage My Blood Pressure-Hypertension (pt-stated)        Barriers: Health Behaviors Timeframe:  Long-Range Goal Priority:  High Start Date:   02/18/21                          Expected End Date:  11/13/21                       Follow Up Date  04/15/22    - check blood pressure weekly - write blood pressure results in a log or diary    Why is this important?   You won't feel high blood pressure, but it can still hurt your blood vessels.  High blood pressure can cause heart or kidney problems. It can also cause a stroke.  Making lifestyle changes like losing a little weight or eating less salt will help.  Checking your blood pressure at home and at different times of the day can help to control blood pressure.  If the doctor prescribes medicine remember to take it  the way the doctor ordered.  Call the office if you cannot afford the medicine or if there are questions about it.     Notes: 02/18/21 Patient does not check blood pressure as she has no  monitor. CM will send monitor to patient.   Hypertension Management Discussed Take medications as ordered Limit salt intake. Eat plenty of fruits and vegetables Remain as active as possible Take blood pressure at least weekly at home and record in a notebook to take to doctor's visits.         Plan: RN CM will provide ongoing education and support to patient through phone calls.   RN CM will send welcome packet with consent to patient.   RN CM will send initial barriers letter, assessment, and care plan to primary care physician.   Follow-up: Patient agrees to Care Plan and Follow-up. Follow-up in 4-6 week(s)  [name], RN, MSN [place] [facility] Care Management Coordinator Direct Line [phone] Cell [phone] Toll Free: [phone]  Fax: [phone]

## 2021-02-19 ENCOUNTER — Telehealth: Payer: Self-pay

## 2021-02-19 MED ORDER — ACCU-CHEK GUIDE VI STRP: ORAL_STRIP | 12 refills | Status: DC

## 2021-02-19 MED ORDER — ACCU-CHEK SOFTCLIX LANCETS MISC: 12 refills | Status: DC

## 2021-02-19 NOTE — Telephone Encounter (Signed)
Pt is coming to pick up glucose meter and a script has been sent in for the supplies.   Sandra Lawrence RMA

## 2021-02-20 ENCOUNTER — Other Ambulatory Visit (INDEPENDENT_AMBULATORY_CARE_PROVIDER_SITE_OTHER): Payer: Self-pay | Admitting: Family Medicine

## 2021-02-20 DIAGNOSIS — Z6841 Body Mass Index (BMI) 40.0 and over, adult: Secondary | ICD-10-CM | POA: Diagnosis not present

## 2021-02-20 DIAGNOSIS — M1990 Unspecified osteoarthritis, unspecified site: Secondary | ICD-10-CM | POA: Diagnosis not present

## 2021-02-20 DIAGNOSIS — E785 Hyperlipidemia, unspecified: Secondary | ICD-10-CM | POA: Diagnosis not present

## 2021-02-20 DIAGNOSIS — I10 Essential (primary) hypertension: Secondary | ICD-10-CM | POA: Diagnosis not present

## 2021-02-20 DIAGNOSIS — G8929 Other chronic pain: Secondary | ICD-10-CM | POA: Diagnosis not present

## 2021-02-20 DIAGNOSIS — E1136 Type 2 diabetes mellitus with diabetic cataract: Secondary | ICD-10-CM | POA: Diagnosis not present

## 2021-02-20 DIAGNOSIS — J45909 Unspecified asthma, uncomplicated: Secondary | ICD-10-CM | POA: Diagnosis not present

## 2021-02-20 DIAGNOSIS — E1169 Type 2 diabetes mellitus with other specified complication: Secondary | ICD-10-CM

## 2021-02-20 DIAGNOSIS — G4733 Obstructive sleep apnea (adult) (pediatric): Secondary | ICD-10-CM | POA: Diagnosis not present

## 2021-02-20 DIAGNOSIS — E782 Mixed hyperlipidemia: Secondary | ICD-10-CM

## 2021-02-24 ENCOUNTER — Ambulatory Visit (INDEPENDENT_AMBULATORY_CARE_PROVIDER_SITE_OTHER): Payer: Medicare PPO | Admitting: Physician Assistant

## 2021-02-24 ENCOUNTER — Encounter (INDEPENDENT_AMBULATORY_CARE_PROVIDER_SITE_OTHER): Payer: Self-pay | Admitting: Physician Assistant

## 2021-02-24 ENCOUNTER — Other Ambulatory Visit: Payer: Self-pay

## 2021-02-24 VITALS — BP 151/78 | HR 56 | Temp 98.0°F | Ht 67.0 in | Wt 265.0 lb

## 2021-02-24 DIAGNOSIS — Z6841 Body Mass Index (BMI) 40.0 and over, adult: Secondary | ICD-10-CM | POA: Diagnosis not present

## 2021-02-24 DIAGNOSIS — E559 Vitamin D deficiency, unspecified: Secondary | ICD-10-CM | POA: Diagnosis not present

## 2021-02-24 DIAGNOSIS — E782 Mixed hyperlipidemia: Secondary | ICD-10-CM

## 2021-02-24 DIAGNOSIS — E1169 Type 2 diabetes mellitus with other specified complication: Secondary | ICD-10-CM

## 2021-02-24 MED ORDER — ROSUVASTATIN CALCIUM 40 MG PO TABS: 40.0000 mg | ORAL_TABLET | Freq: Every day | ORAL | 0 refills | Status: DC

## 2021-02-24 MED ORDER — VITAMIN D (ERGOCALCIFEROL) 1.25 MG (50000 UNIT) PO CAPS: 50000.0000 [IU] | ORAL_CAPSULE | ORAL | 0 refills | Status: DC

## 2021-02-24 NOTE — Progress Notes (Signed)
Chief Complaint:   OBESITY Sandra Lawrence is here to discuss her progress with her obesity treatment plan along with follow-up of her obesity related diagnoses. Sandra Lawrence is on the Category 2 Plan and states she is following her eating plan approximately 85% of the time. Sandra Lawrence states she is not currently exercising.  Today's visit was #: 5 Starting weight: 286 lbs Starting date: 12/18/2020 Today's weight: 265 lbs Today's date: 02/24/2021 Total lbs lost to date: 21 Total lbs lost since last in-office visit: 6  Interim History: Sandra Lawrence did very well getting back on the plan since her cruise. She is interested in more options for breakfast. She has not slept well the past 2 nights and her BP is elevated today. Pt also did not take her BP meds this morning. She denies headache or chest pain.  Subjective:   1. Vitamin D deficiency Sandra Lawrence is on prescription Vit D and denies nausea, vomiting, and muscle weakness.  2. Mixed diabetic hyperlipidemia associated with type 2 diabetes mellitus (Sandra Lawrence) Pt is on Crestor and tolerating it well.  Assessment/Plan:   1. Vitamin D deficiency Low Vitamin D level contributes to fatigue and are associated with obesity, breast, and colon cancer. She agrees to continue to take prescription Vitamin D 50,000 IU every week and will follow-up for routine testing of Vitamin D, at least 2-3 times per year to avoid over-replacement.  Refill- Vitamin D, Ergocalciferol, (DRISDOL) 1.25 MG (50000 UNIT) CAPS capsule; Take 1 capsule (50,000 Units total) by mouth every 7 (seven) days.  Dispense: 4 capsule; Refill: 0  2. Mixed diabetic hyperlipidemia associated with type 2 diabetes mellitus ([place]) Cardiovascular risk and specific lipid/LDL goals reviewed.  We discussed several lifestyle modifications today and Sandra Lawrence will continue to work on diet, exercise and weight loss efforts. Orders and follow up as documented in patient record. Continue Crestor 40 mg as  prescribed.  Counseling Intensive lifestyle modifications are the first line treatment for this issue. Dietary changes: Increase soluble fiber. Decrease simple carbohydrates. Exercise changes: Moderate to vigorous-intensity aerobic activity 150 minutes per week if tolerated. Lipid-lowering medications: see documented in medical record.  Refill- rosuvastatin (CRESTOR) 40 MG tablet; Take 1 tablet (40 mg total) by mouth at bedtime.  Dispense: 30 tablet; Refill: 0  3. Obesity with current BMI of 41.5  Sandra Lawrence is currently in the action stage of change. As such, her goal is to continue with weight loss efforts. She has agreed to the Category 2 Plan.   Exercise goals:  As is  Behavioral modification strategies: meal planning and cooking strategies and keeping healthy foods in the home.  Sandra Lawrence has agreed to follow-up with our clinic in 3 weeks. She was informed of the importance of frequent follow-up visits to maximize her success with intensive lifestyle modifications for her multiple health conditions.   Objective:   Blood pressure (!) 151/78, pulse (!) 56, temperature 98 F (36.7 C), temperature source Oral, height 5\' 7"  (1.702 m), weight 265 lb (120.2 kg), SpO2 98 %. Body mass index is 41.5 kg/m.  General: Cooperative, alert, well developed, in no acute distress. HEENT: Conjunctivae and lids unremarkable. Cardiovascular: Regular rhythm.  Lungs: Normal work of breathing. Neurologic: No focal deficits.   Lab Results  Component Value Date   CREATININE 0.61 01/18/2021   BUN 21 01/18/2021   NA 136 01/18/2021   K 3.6 01/18/2021   CL 105 01/18/2021   CO2 23 01/18/2021   Lab Results  Component Value Date   ALT 16  12/18/2020   AST 17 12/18/2020   ALKPHOS 120 12/18/2020   BILITOT 0.4 12/18/2020   Lab Results  Component Value Date   HGBA1C 7.6 (H) 12/18/2020   HGBA1C 7.5 (A) 09/26/2020   HGBA1C 7.9 (A) 08/20/2019   HGBA1C 8.6 (A) 02/21/2019   HGBA1C 7.1 11/04/2016   Lab  Results  Component Value Date   INSULIN 12.1 12/18/2020   Lab Results  Component Value Date   TSH 1.650 12/18/2020   Lab Results  Component Value Date   CHOL 171 12/18/2020   HDL 53 12/18/2020   LDLCALC 103 (H) 12/18/2020   TRIG 78 12/18/2020   CHOLHDL 3.8 02/21/2019   Lab Results  Component Value Date   VD25OH 23.0 (L) 12/18/2020   Lab Results  Component Value Date   WBC 6.0 01/18/2021   HGB 13.2 01/18/2021   HCT 41.4 01/18/2021   MCV 90.0 01/18/2021   PLT 260 01/18/2021   No results found for: IRON, TIBC, FERRITIN  Obesity Behavioral Intervention:   Approximately 15 minutes were spent on the discussion below.  ASK: We discussed the diagnosis of obesity with [name] today and Lake agreed to give [place] permission to discuss obesity behavioral modification therapy today.  ASSESS: Chyrel has the diagnosis of obesity and her BMI today is 41.5. Lanaysia is in the action stage of change.   ADVISE: Anoushka was educated on the multiple health risks of obesity as well as the benefit of weight loss to improve her health. She was advised of the need for long term treatment and the importance of lifestyle modifications to improve her current health and to decrease her risk of future health problems.  AGREE: Multiple dietary modification options and treatment options were discussed and Rashaun agreed to follow the recommendations documented in the above note.  ARRANGE: Taleisha was educated on the importance of frequent visits to treat obesity as outlined per CMS and USPSTF guidelines and agreed to schedule her next follow up appointment today.  Attestation Statements:   Reviewed by clinician on day of visit: allergies, medications, problem list, medical history, surgical history, family history, social history, and previous encounter notes.  Coral Ceo, CMA, am acting as transcriptionist for [employer], PA-C.  I have reviewed the above documentation for accuracy and  completeness, and I agree with the above. Abby Potash, PA-C

## 2021-03-10 ENCOUNTER — Ambulatory Visit (INDEPENDENT_AMBULATORY_CARE_PROVIDER_SITE_OTHER): Payer: Medicare PPO | Admitting: Family Medicine

## 2021-03-10 ENCOUNTER — Ambulatory Visit: Payer: Medicare PPO | Admitting: Family Medicine

## 2021-03-10 ENCOUNTER — Encounter: Payer: Self-pay | Admitting: Family Medicine

## 2021-03-10 ENCOUNTER — Other Ambulatory Visit: Payer: Self-pay

## 2021-03-10 VITALS — BP 140/80 | HR 66 | Temp 97.8°F | Wt 263.4 lb

## 2021-03-10 DIAGNOSIS — M1610 Unilateral primary osteoarthritis, unspecified hip: Secondary | ICD-10-CM

## 2021-03-10 DIAGNOSIS — Z6841 Body Mass Index (BMI) 40.0 and over, adult: Secondary | ICD-10-CM | POA: Diagnosis not present

## 2021-03-10 DIAGNOSIS — I951 Orthostatic hypotension: Secondary | ICD-10-CM

## 2021-03-10 MED ORDER — ACCU-CHEK GUIDE VI STRP: ORAL_STRIP | 12 refills | Status: AC

## 2021-03-10 MED ORDER — ACCU-CHEK SOFTCLIX LANCETS MISC: 12 refills | Status: DC

## 2021-03-10 NOTE — Patient Instructions (Signed)
You need to go from lying to sitting to standing and then move in a step along the way is based on whether you are dizzy.  Be careful taking the codeine because that can make you drowsy and make you more likely to fall. Give them a call and see if they can then give you another injection

## 2021-03-10 NOTE — Progress Notes (Signed)
   Subjective:    Patient ID: Sandra Lawrence, female    DOB: [birth year], 75 y.o.   MRN: [id number]  HPI She is here for consult concerning several episodes of falling.  The positive all occurred when she is gone from lying to standing or sitting to standing quickly became slightly dizzy which did go away fairly quickly.  She is also taking ibuprofen 800 mg 3 times per day as well as Tylenol for her hip pain.  She is in the process of losing weight to qualify to have hip surgery.  She was also given some codeine recently and is using this appropriately.  She notes that in the past joint injection did help.  She is interested possibly having another.   Review of Systems     Objective:   Physical Exam Alert and in no distress.  Cardiac exam shows regular rhythm without murmurs or gallops.  Lungs are clear to auscultation.  Blood pressure is recorded.       Assessment & Plan:  Postural hypotension  Obesity with current BMI of 41.5  Arthritis of hip I explained that she needs to be moving more slowly from lying to sitting to standing which should help with her symptoms.  Also recommend she call orthopedics and see if they can possibly give her another injection to help with hip pain prior to having hip surgery.

## 2021-03-11 ENCOUNTER — Other Ambulatory Visit: Payer: Self-pay | Admitting: Family Medicine

## 2021-03-11 DIAGNOSIS — Z1231 Encounter for screening mammogram for malignant neoplasm of breast: Secondary | ICD-10-CM

## 2021-03-17 ENCOUNTER — Ambulatory Visit (INDEPENDENT_AMBULATORY_CARE_PROVIDER_SITE_OTHER): Payer: Medicare PPO | Admitting: Family Medicine

## 2021-03-17 ENCOUNTER — Encounter (INDEPENDENT_AMBULATORY_CARE_PROVIDER_SITE_OTHER): Payer: Self-pay | Admitting: Family Medicine

## 2021-03-17 ENCOUNTER — Other Ambulatory Visit: Payer: Self-pay

## 2021-03-17 VITALS — BP 139/83 | HR 68 | Temp 97.6°F | Ht 67.0 in | Wt 254.0 lb

## 2021-03-17 DIAGNOSIS — I152 Hypertension secondary to endocrine disorders: Secondary | ICD-10-CM

## 2021-03-17 DIAGNOSIS — E1169 Type 2 diabetes mellitus with other specified complication: Secondary | ICD-10-CM | POA: Diagnosis not present

## 2021-03-17 DIAGNOSIS — E559 Vitamin D deficiency, unspecified: Secondary | ICD-10-CM | POA: Diagnosis not present

## 2021-03-17 DIAGNOSIS — E1159 Type 2 diabetes mellitus with other circulatory complications: Secondary | ICD-10-CM

## 2021-03-17 DIAGNOSIS — E782 Mixed hyperlipidemia: Secondary | ICD-10-CM | POA: Diagnosis not present

## 2021-03-17 DIAGNOSIS — Z6841 Body Mass Index (BMI) 40.0 and over, adult: Secondary | ICD-10-CM

## 2021-03-17 MED ORDER — VITAMIN D (ERGOCALCIFEROL) 1.25 MG (50000 UNIT) PO CAPS: 50000.0000 [IU] | ORAL_CAPSULE | ORAL | 0 refills | Status: DC

## 2021-03-17 MED ORDER — ROSUVASTATIN CALCIUM 40 MG PO TABS: 40.0000 mg | ORAL_TABLET | Freq: Every day | ORAL | 0 refills | Status: DC

## 2021-03-18 NOTE — Progress Notes (Signed)
Chief Complaint:   OBESITY Sandra Lawrence is here to discuss her progress with her obesity treatment plan along with follow-up of her obesity related diagnoses. Sandra Lawrence is on the Category 2 Plan and states she is following her eating plan approximately 80% of the time. Lindsy states she is doing 0 minutes 0 times per week.  Today's visit was #: 6 Starting weight: 286 lbs Starting date: 12/18/2020 Today's weight: 254 lbs Today's date: 03/17/2021 Total lbs lost to date: 32 Total lbs lost since last in-office visit: 11  Interim History: Evert Kohl is here for a follow up office visit.  We reviewed her meal plan and questions were answered.  Patient's food recall appears to be accurate and consistent with what is on plan when she is following it.   When eating on plan, her hunger and cravings are well controlled.    Subjective:   1. Type 2 diabetes mellitus with other specified complication, without long-term current use of insulin (HCC) Annina's last A1c was 7.6 approximately 2.5 months ago. Her fasting blood sugars have been ranging between 93-116. She is taking Trulicity.   2. Mixed diabetic hyperlipidemia associated with type 2 diabetes mellitus ([place]) Brisia was changed from statin to Crestor on 8/29, and she is tolerating it well. Her last LDL 2.5 months ago was 103 and not at goal thus change in therapy initiated.   3. Hypertension associated with type 2 diabetes mellitus ([place]) Allanah's blood pressure is under much better control versus her last office visit. She has a wrist cuff at home that is not really accurate.  4. Vitamin D deficiency VERNIE VINCIGUERRA is tolerating Vit D prescription well without side effects. Medication compliance is good and patient appears to be taking it as prescribed.  Denies additional concerns regarding this condition.   Assessment/Plan:   Orders Placed This Encounter  Procedures   Comprehensive metabolic panel   Hemoglobin A1c   Insulin, random   VITAMIN D  25 Hydroxy (Vit-D Deficiency, Fractures)   Lipid Panel With LDL/HDL Ratio    Medications Discontinued During This Encounter  Medication Reason   HYDROcodone-acetaminophen (NORCO) 10-325 MG tablet Error   ibuprofen (ADVIL) 800 MG tablet Error   Vitamin D, Ergocalciferol, (DRISDOL) 1.25 MG (50000 UNIT) CAPS capsule Reorder   rosuvastatin (CRESTOR) 40 MG tablet Reorder     Meds ordered this encounter  Medications   Vitamin D, Ergocalciferol, (DRISDOL) 1.25 MG (50000 UNIT) CAPS capsule    Sig: Take 1 capsule (50,000 Units total) by mouth every 7 (seven) days.    Dispense:  4 capsule    Refill:  0    Ov for RF; 30 d supply   rosuvastatin (CRESTOR) 40 MG tablet    Sig: Take 1 tablet (40 mg total) by mouth at bedtime.    Dispense:  30 tablet    Refill:  0    Ov for RF     1. Type 2 diabetes mellitus with other specified complication, without long-term current use of insulin (HCC) We will recheck labs at her next office visit. Rhia will continue Trulicity to aid with hunger and weight loss, and she will continue her prudent nutritional plan. Good blood sugar control is important to decrease the likelihood of diabetic complications such as nephropathy, neuropathy, limb loss, blindness, coronary artery disease, and death. Intensive lifestyle modification including diet, exercise and weight loss are the first line of treatment for diabetes.   - Comprehensive metabolic panel - Hemoglobin A1c -  Insulin, random  2. Mixed diabetic hyperlipidemia associated with type 2 diabetes mellitus ([place]) Cardiovascular risk and specific lipid/LDL goals reviewed. We discussed several lifestyle modifications today. We will recheck labs at her next office visit. We will refill Crestor for 1 month. Cinda will continue to work on diet, exercise and weight loss efforts. Orders and follow up as documented in patient record.   Counseling Intensive lifestyle modifications are the first line treatment for this  issue. Dietary changes: Increase soluble fiber. Decrease simple carbohydrates. Exercise changes: Moderate to vigorous-intensity aerobic activity 150 minutes per week if tolerated. Lipid-lowering medications: see documented in medical record.  - rosuvastatin (CRESTOR) 40 MG tablet; Take 1 tablet (40 mg total) by mouth at bedtime.  Dispense: 30 tablet; Refill: 0 - Lipid Panel With LDL/HDL Ratio  3. Hypertension associated with type 2 diabetes mellitus ([place]) Cory will continue working on healthy weight loss and exercise to improve blood pressure control. I recommended a Omron 3 series blood pressure cuff. We will watch for signs of hypotension as she continues her lifestyle modifications.  4. Vitamin D deficiency Low Vitamin D level contributes to fatigue and are associated with obesity, breast, and colon cancer. We will refill prescription Vitamin D for 1 month. We will recheck labs at her next office visit. Evangelynn will follow-up for routine testing of Vitamin D, at least 2-3 times per year to avoid over-replacement.  - Vitamin D, Ergocalciferol, (DRISDOL) 1.25 MG (50000 UNIT) CAPS capsule; Take 1 capsule (50,000 Units total) by mouth every 7 (seven) days.  Dispense: 4 capsule; Refill: 0 - VITAMIN D 25 Hydroxy (Vit-D Deficiency, Fractures)  5. Obesity BMI today 19 Alena is currently in the action stage of change. As such, her goal is to continue with weight loss efforts. She has agreed to the Category 2 Plan with breakfast options.   We reviewed sauces Maralyn Sago) and others that are 10 calories/2 tablespoons to enhance flavors of her food etc.  We will recheck fasting labs at her next office visit.  Exercise goals: As is.  Behavioral modification strategies: increasing lean protein intake and planning for success.  Carolene has agreed to follow-up with our clinic in 2 to 3 weeks. She was informed of the importance of frequent follow-up visits to maximize her success with intensive lifestyle  modifications for her multiple health conditions.   Objective:   Blood pressure 139/83, pulse 68, temperature 97.6 F (36.4 C), height 5\' 7"  (1.702 m), weight 254 lb (115.2 kg), SpO2 98 %. Body mass index is 39.78 kg/m.  General: Cooperative, alert, well developed, in no acute distress. HEENT: Conjunctivae and lids unremarkable. Cardiovascular: Regular rhythm.  Lungs: Normal work of breathing. Neurologic: No focal deficits.   Lab Results  Component Value Date   CREATININE 0.61 01/18/2021   BUN 21 01/18/2021   NA 136 01/18/2021   K 3.6 01/18/2021   CL 105 01/18/2021   CO2 23 01/18/2021   Lab Results  Component Value Date   ALT 16 12/18/2020   AST 17 12/18/2020   ALKPHOS 120 12/18/2020   BILITOT 0.4 12/18/2020   Lab Results  Component Value Date   HGBA1C 7.6 (H) 12/18/2020   HGBA1C 7.5 (A) 09/26/2020   HGBA1C 7.9 (A) 08/20/2019   HGBA1C 8.6 (A) 02/21/2019   HGBA1C 7.1 11/04/2016   Lab Results  Component Value Date   INSULIN 12.1 12/18/2020   Lab Results  Component Value Date   TSH 1.650 12/18/2020   Lab Results  Component Value  Date   CHOL 171 12/18/2020   HDL 53 12/18/2020   LDLCALC 103 (H) 12/18/2020   TRIG 78 12/18/2020   CHOLHDL 3.8 02/21/2019   Lab Results  Component Value Date   VD25OH 23.0 (L) 12/18/2020   Lab Results  Component Value Date   WBC 6.0 01/18/2021   HGB 13.2 01/18/2021   HCT 41.4 01/18/2021   MCV 90.0 01/18/2021   PLT 260 01/18/2021   No results found for: IRON, TIBC, FERRITIN  Obesity Behavioral Intervention:   Approximately 15 minutes were spent on the discussion below.  ASK: We discussed the diagnosis of obesity with [name] today and Nelline agreed to give [place] permission to discuss obesity behavioral modification therapy today.  ASSESS: Veida has the diagnosis of obesity and her BMI today is 39.9. Rhiannan is in the action stage of change.   ADVISE: Solara was educated on the multiple health risks of obesity as well as the  benefit of weight loss to improve her health. She was advised of the need for long term treatment and the importance of lifestyle modifications to improve her current health and to decrease her risk of future health problems.  AGREE: Multiple dietary modification options and treatment options were discussed and Kerrigan agreed to follow the recommendations documented in the above note.  ARRANGE: Jennesis was educated on the importance of frequent visits to treat obesity as outlined per CMS and USPSTF guidelines and agreed to schedule her next follow up appointment today.  Attestation Statements:   Reviewed by clinician on day of visit: allergies, medications, problem list, medical history, surgical history, family history, social history, and previous encounter notes.   Wilhemena Durie, am acting as transcriptionist for [employer], DO.  I have reviewed the above documentation for accuracy and completeness, and I agree with the above. Marjory Sneddon, D.O.  The [place] was signed into law in 2016 which includes the topic of electronic health records.  This provides immediate access to information in MyChart.  This includes consultation notes, operative notes, office notes, lab results and pathology reports.  If you have any questions about what you read please let us know at your next visit so we can discuss your concerns and take corrective action if need be.  We are right here with you.

## 2021-03-20 ENCOUNTER — Other Ambulatory Visit: Payer: Self-pay

## 2021-03-20 NOTE — Patient Outreach (Signed)
[facility]) Care Management  [place]  03/20/2021   Sandra Lawrence Sep 07, 1945 [id number]  Subjective: Telephone call to patient for follow up. Patient doing well.  Diabetes management continues.  No concerns.    Objective:   Encounter Medications:  Outpatient Encounter Medications as of 03/20/2021  Medication Sig   Accu-Chek Softclix Lancets lancets Use as instructed   albuterol (VENTOLIN HFA) 108 (90 Base) MCG/ACT inhaler INHALE 1-2 PUFFS BY MOUTH EVERY 6 HOURS AS NEEDED FOR WHEEZE OR SHORTNESS OF BREATH   Cyanocobalamin (VITAMIN B 12 PO) Take by mouth.   Dulaglutide (TRULICITY) 3.15 VV/6.1YW SOPN Inject 0.75 mg into the skin once a week.   fluticasone (FLOVENT HFA) 44 MCG/ACT inhaler Inhale 2 puffs into the lungs 2 (two) times daily.   glucose blood (ACCU-CHEK GUIDE) test strip Use as instructed   lidocaine (XYLOCAINE) 5 % ointment Apply up to 2 gms up to 4 times daily prn pain   losartan-hydrochlorothiazide (HYZAAR) 100-12.5 MG tablet Take 1 tablet by mouth daily.   metoprolol tartrate (LOPRESSOR) 50 MG tablet Take 1 tablet (50 mg total) by mouth 2 (two) times daily.   Multiple Vitamins-Minerals (CENTRUM SILVER PO) Take by mouth.   nitroGLYCERIN (NITROSTAT) 0.4 MG SL tablet PLACE 1 TABLET (0.4 MG TOTAL) UNDER THE TONGUE EVERY 5 (FIVE) MINUTES AS NEEDED FOR CHEST PAIN.   orlistat (XENICAL) 120 MG capsule Take 120 mg by mouth 3 (three) times daily with meals.   rosuvastatin (CRESTOR) 40 MG tablet Take 1 tablet (40 mg total) by mouth at bedtime.   Vitamin D, Ergocalciferol, (DRISDOL) 1.25 MG (50000 UNIT) CAPS capsule Take 1 capsule (50,000 Units total) by mouth every 7 (seven) days.   [DISCONTINUED] gabapentin (NEURONTIN) 100 MG capsule Take 1 capsule (100 mg total) by mouth 3 (three) times daily. (Patient not taking: Reported on 07/12/2019)   No facility-administered encounter medications on file as of 03/20/2021.    Functional Status:  In your present  state of health, do you have any difficulty performing the following activities: 02/18/2021 09/26/2020  Hearing? N N  Vision? N Y  Difficulty concentrating or making decisions? N N  Walking or climbing stairs? Y N  Comment arthritis uses rollator -  Dressing or bathing? N Y  Doing errands, shopping? Y N  Comment family assists as needed -  Conservation officer, nature and eating ? N -  Using the Toilet? N -  In the past six months, have you accidently leaked urine? N -  Do you have problems with loss of bowel control? N -  Managing your Medications? N -  Managing your Finances? N -  Housekeeping or managing your Housekeeping? Y -  Comment arthritis-family assists -  Some recent data might be hidden    Fall/Depression Screening: Fall Risk  02/18/2021 01/24/2021 09/26/2020  Falls in the past year? 1 1 1   Number falls in past yr: 0 0 0  Injury with Fall? 0 0 0  Risk for fall due to : History of fall(s) - Impaired mobility  Follow up Education provided - Falls evaluation completed   PHQ 2/9 Scores 02/18/2021 01/24/2021 12/18/2020 09/26/2020 03/19/2020 12/22/2017 11/04/2016  PHQ - 2 Score 0 0 5 0 0 0 0  PHQ- 9 Score - 0 22 - - - -    Assessment:   Care Plan Care Plan : Diabetes Type 2 (Adult)  Updates made by Jon Billings, RN since 03/20/2021 12:00 AM     Problem: Glycemic Management (Diabetes,  Type 2)      Long-Range Goal: Glycemic Management Optimized as evidenced by A1c less than 7.0   Start Date: 02/18/2021  Expected End Date: 11/13/2021  This Visit's Progress: On track  Priority: High  Note:   Evidence-based guidance:  Anticipate A1C testing (point-of-care) every 3 to 6 months based on goal attainment.  Review mutually-set A1C goal or target range.  Anticipate use of antihyperglycemic with or without insulin and periodic adjustments; consider active involvement of pharmacist.  Provide medical nutrition therapy and development of individualized eating.  Compare self-reported symptoms of hypo  or hyperglycemia to blood glucose levels, diet and fluid intake, current medications, psychosocial and physiologic stressors, change in activity and barriers to care adherence.  Promote self-monitoring of blood glucose levels.  Assess and address barriers to management plan, such as food insecurity, age, developmental ability, depression, anxiety, fear of hypoglycemia or weight gain, as well as medication cost, side effects and complicated regimen.  Consider referral to community-based diabetes education program, visiting nurse, community health worker or health coach.  Encourage regular dental care for treatment of periodontal disease; refer to dental provider when needed.   Notes:     Task: Alleviate Barriers to Glycemic Management   Due Date: 11/13/2021  Priority: Routine  Responsible User: Jon Billings, RN  Note:   Care Management Activities:    - blood glucose monitoring encouraged - mutual A1C goal set or reviewed - use of blood glucose monitoring log promoted    Notes: 02/18/21 Patient does not have a monitor.  CM sent message to MD for order to her pharmacy for one.  A1c 7.6 Diabetes Management Discussed: Medication adherence Reviewed importance of limiting carbs such as rice, potatoes, breads, and pastas. Also discussed limiting sweets and sugary drinks.  Discussed importance of portion control.  Also discussed importance of annual exams, foot exams, and eye exams.   03/20/21 Patient checking sugars daily.  Blood sugar range 93-116.  Reviewed blood sugar management.     Care Plan : Hypertension (Adult)  Updates made by Jon Billings, RN since 03/20/2021 12:00 AM     Problem: Hypertension (Hypertension)      Long-Range Goal: Hypertension Monitored as evidenced by blood pressure less than 140/80.   Start Date: 02/18/2021  Expected End Date: 11/13/2021  This Visit's Progress: On track  Priority: High  Note:   Evidence-based guidance:  Promote initial use of ambulatory blood  pressure measurements (for 3 days) to rule out "white-coat" effect; identify masked hypertension and presence or absence of nocturnal "dipping" of blood pressure.   Encourage continued use of home blood pressure monitoring and recording in blood pressure log; include symptoms of hypotension or potential medication side effects in log.  Review blood pressure measurements taken inside and outside of the provider office; establish baseline and monitor trends; compare to target ranges or patient goal.  Share overall cardiovascular risk with patient; encourage changes to lifestyle risk factors, including alcohol consumption, smoking, inadequate exercise, poor dietary habits and stress.   Notes:     Task: Identify and Monitor Blood Pressure Elevation   Due Date: 11/13/2021  Priority: Routine  Responsible User: Jon Billings, RN  Note:   Care Management Activities:    - depression screen reviewed - home or ambulatory blood pressure monitoring encouraged    Notes: 02/18/21 Patient does not monitor blood pressure. CM will have monitor ordered.  Encouraged to check weekly.   Hypertension Management Discussed Take medications as ordered Limit salt intake. Eat plenty  of fruits and vegetables Remain as active as possible Take blood pressure at least weekly at home and record in a notebook to take to doctor's visits.  03/20/21 Patient last blood pressure 139/83.  Reviewed blood pressure management and diet.        Goals Addressed               This Visit's Progress     Monitor and Manage My Blood Sugar-Diabetes Type 2 (pt-stated)   On track     Barriers: Health Behaviors Timeframe:  Long-Range Goal Priority:  High Start Date:     02/18/21                        Expected End Date:   11/13/21                    Follow Up Date 05/16/21  - check blood sugar at prescribed times - enter blood sugar readings and medication or insulin into daily log - take the blood sugar log to all doctor  visits    Why is this important?   Checking your blood sugar at home helps to keep it from getting very high or very low.  Writing the results in a diary or log helps the doctor know how to care for you.  Your blood sugar log should have the time, date and the results.  Also, write down the amount of insulin or other medicine that you take.  Other information, like what you ate, exercise done and how you were feeling, will also be helpful.     Notes: 02/18/21 Patient does not have a meter.  Will get MD to order one.  A1c 7.6 Diabetes Management Discussed: Medication adherence Reviewed importance of limiting carbs such as rice, potatoes, breads, and pastas. Also discussed limiting sweets and sugary drinks.  Discussed importance of portion control.  Also discussed importance of annual exams, foot exams, and eye exams.   03/20/21 Patient has meter now and checking sugars daily.  Sugars ranging 93-116.  Reviewed diabetes management. Patient reports losing 11 lbs since last conversation. Patient going to nutrition center for diet.        Track and Manage My Blood Pressure-Hypertension (pt-stated)   On track     Barriers: Health Behaviors Timeframe:  Long-Range Goal Priority:  High Start Date:   02/18/21                          Expected End Date:  11/13/21                       Follow Up Date  05/16/21   - check blood pressure weekly - write blood pressure results in a log or diary    Why is this important?   You won't feel high blood pressure, but it can still hurt your blood vessels.  High blood pressure can cause heart or kidney problems. It can also cause a stroke.  Making lifestyle changes like losing a little weight or eating less salt will help.  Checking your blood pressure at home and at different times of the day can help to control blood pressure.  If the doctor prescribes medicine remember to take it the way the doctor ordered.  Call the office if you cannot afford the medicine  or if there are questions about it.     Notes: 02/18/21 Patient  does not check blood pressure as she has no monitor. CM will send monitor to patient.   Hypertension Management Discussed Take medications as ordered Limit salt intake. Eat plenty of fruits and vegetables Remain as active as possible Take blood pressure at least weekly at home and record in a notebook to take to doctor's visits.  03/20/21 Patient now checking blood pressure at home. Last reading was 139/83.  Encouraged to continue hypertension management.          Plan:  Follow-up: Patient agrees to Care Plan and Follow-up. Follow-up in 1 month(s) [name], RN, MSN [place] [facility] Care Management Coordinator Direct Line [phone] Cell [phone] Toll Free: [phone]  Fax: [phone]

## 2021-03-20 NOTE — Patient Instructions (Signed)
Goals Addressed               This Visit's Progress     Monitor and Manage My Blood Sugar-Diabetes Type 2 (pt-stated)   On track     Barriers: Health Behaviors Timeframe:  Long-Range Goal Priority:  High Start Date:     02/18/21                        Expected End Date:   11/13/21                    Follow Up Date 05/16/21  - check blood sugar at prescribed times - enter blood sugar readings and medication or insulin into daily log - take the blood sugar log to all doctor visits    Why is this important?   Checking your blood sugar at home helps to keep it from getting very high or very low.  Writing the results in a diary or log helps the doctor know how to care for you.  Your blood sugar log should have the time, date and the results.  Also, write down the amount of insulin or other medicine that you take.  Other information, like what you ate, exercise done and how you were feeling, will also be helpful.     Notes: 02/18/21 Patient does not have a meter.  Will get MD to order one.  A1c 7.6 Diabetes Management Discussed: Medication adherence Reviewed importance of limiting carbs such as rice, potatoes, breads, and pastas. Also discussed limiting sweets and sugary drinks.  Discussed importance of portion control.  Also discussed importance of annual exams, foot exams, and eye exams.   03/20/21 Patient has meter now and checking sugars daily.  Sugars ranging 93-116.  Reviewed diabetes management. Patient reports losing 11 lbs since last conversation. Patient going to nutrition center for diet.        Track and Manage My Blood Pressure-Hypertension (pt-stated)   On track     Barriers: Health Behaviors Timeframe:  Long-Range Goal Priority:  High Start Date:   02/18/21                          Expected End Date:  11/13/21                       Follow Up Date  05/16/21   - check blood pressure weekly - write blood pressure results in a log or diary    Why is this important?    You won't feel high blood pressure, but it can still hurt your blood vessels.  High blood pressure can cause heart or kidney problems. It can also cause a stroke.  Making lifestyle changes like losing a little weight or eating less salt will help.  Checking your blood pressure at home and at different times of the day can help to control blood pressure.  If the doctor prescribes medicine remember to take it the way the doctor ordered.  Call the office if you cannot afford the medicine or if there are questions about it.     Notes: 02/18/21 Patient does not check blood pressure as she has no monitor. CM will send monitor to patient.   Hypertension Management Discussed Take medications as ordered Limit salt intake. Eat plenty of fruits and vegetables Remain as active as possible Take blood pressure at least weekly at home and record  in a notebook to take to doctor's visits.  03/20/21 Patient now checking blood pressure at home. Last reading was 139/83.  Encouraged to continue hypertension management.

## 2021-03-27 ENCOUNTER — Ambulatory Visit: Payer: Medicare PPO

## 2021-04-01 ENCOUNTER — Ambulatory Visit (INDEPENDENT_AMBULATORY_CARE_PROVIDER_SITE_OTHER): Payer: Medicare PPO | Admitting: Physician Assistant

## 2021-04-02 ENCOUNTER — Encounter (INDEPENDENT_AMBULATORY_CARE_PROVIDER_SITE_OTHER): Payer: Self-pay | Admitting: Family Medicine

## 2021-04-02 ENCOUNTER — Ambulatory Visit (INDEPENDENT_AMBULATORY_CARE_PROVIDER_SITE_OTHER): Payer: Medicare PPO | Admitting: Family Medicine

## 2021-04-02 ENCOUNTER — Other Ambulatory Visit: Payer: Self-pay

## 2021-04-02 VITALS — BP 137/82 | HR 65 | Temp 98.2°F | Ht 67.0 in | Wt 254.0 lb

## 2021-04-02 DIAGNOSIS — E559 Vitamin D deficiency, unspecified: Secondary | ICD-10-CM

## 2021-04-02 DIAGNOSIS — E782 Mixed hyperlipidemia: Secondary | ICD-10-CM | POA: Diagnosis not present

## 2021-04-02 DIAGNOSIS — Z6841 Body Mass Index (BMI) 40.0 and over, adult: Secondary | ICD-10-CM

## 2021-04-02 DIAGNOSIS — E1169 Type 2 diabetes mellitus with other specified complication: Secondary | ICD-10-CM

## 2021-04-02 MED ORDER — SEMAGLUTIDE (1 MG/DOSE) 4 MG/3ML ~~LOC~~ SOPN: 1.0000 mg | PEN_INJECTOR | SUBCUTANEOUS | 0 refills | Status: DC

## 2021-04-02 MED ORDER — ROSUVASTATIN CALCIUM 40 MG PO TABS: 40.0000 mg | ORAL_TABLET | Freq: Every day | ORAL | 0 refills | Status: DC

## 2021-04-02 MED ORDER — VITAMIN D (ERGOCALCIFEROL) 1.25 MG (50000 UNIT) PO CAPS: 50000.0000 [IU] | ORAL_CAPSULE | ORAL | 0 refills | Status: DC

## 2021-04-02 NOTE — Progress Notes (Signed)
Chief Complaint:   OBESITY Sandra Lawrence is here to discuss her progress with her obesity treatment plan along with follow-up of her obesity related diagnoses. Sandra Lawrence is on the Category 2 Plan with breakfast options and states she is following her eating plan approximately 90% of the time. Sandra Lawrence states she is not currently exercising.  Today's visit was #: 7 Starting weight: 286 lbs Starting date: 12/18/2020 Today's weight: 254 lbs Today's date: 04/02/2021 Total lbs lost to date: 32 Total lbs lost since last in-office visit: 0  Interim History: Sandra Lawrence is eating on plan and food recall is accurate, drinking water  Subjective:   1. Type 2 diabetes mellitus with other specified complication, without long-term current use of insulin (HCC) Sandra Lawrence's highest blood glucose 126, but is usually 90-100's. Pt denies lows. She is on Trulicity and takes it every other week because she is scared it will make her gain weight.   2. Vitamin D deficiency She is currently taking prescription vitamin D 50,000 IU each week. She denies nausea, vomiting or muscle weakness.  3. Mixed diabetic hyperlipidemia associated with type 2 diabetes mellitus ([place]) Sandra Lawrence has hyperlipidemia and has been trying to improve her cholesterol levels with intensive lifestyle modification including a low saturated fat diet, exercise and weight loss. She denies any chest pain, claudication or myalgias.  Assessment/Plan:   Orders Placed This Encounter  Procedures   Comprehensive metabolic panel   Hemoglobin A1c   Insulin, random   Lipid Panel With LDL/HDL Ratio   VITAMIN D 25 Hydroxy (Vit-D Deficiency, Fractures)    Medications Discontinued During This Encounter  Medication Reason   Dulaglutide (TRULICITY) 7.41 OI/7.8MV SOPN    Vitamin D, Ergocalciferol, (DRISDOL) 1.25 MG (50000 UNIT) CAPS capsule Reorder   rosuvastatin (CRESTOR) 40 MG tablet Reorder   Semaglutide, 1 MG/DOSE, 4 MG/3ML SOPN      Meds ordered this encounter   Medications   rosuvastatin (CRESTOR) 40 MG tablet    Sig: Take 1 tablet (40 mg total) by mouth at bedtime.    Dispense:  30 tablet    Refill:  0    Ov for RF   Vitamin D, Ergocalciferol, (DRISDOL) 1.25 MG (50000 UNIT) CAPS capsule    Sig: Take 1 capsule (50,000 Units total) by mouth every 7 (seven) days.    Dispense:  4 capsule    Refill:  0    Ov for RF; 30 d supply   DISCONTD: Semaglutide, 1 MG/DOSE, 4 MG/3ML SOPN    Sig: Inject 1 mg as directed once a week.    Dispense:  3 mL    Refill:  0   Semaglutide, 1 MG/DOSE, 4 MG/3ML SOPN    Sig: Inject 1 mg as directed once a week.    Dispense:  3 mL    Refill:  0     1. Type 2 diabetes mellitus with other specified complication, without long-term current use of insulin (HCC) Good blood sugar control is important to decrease the likelihood of diabetic complications such as nephropathy, neuropathy, limb loss, blindness, coronary artery disease, and death. Intensive lifestyle modification including diet, exercise and weight loss are the first line of treatment for diabetes.  Change from Trulicity to Ozempic 1 mg weekly. Pt will stay on Trulicity until she obtains Ozempic. Check labs today.  - Comprehensive metabolic panel - Hemoglobin A1c - Insulin, random  2. Vitamin D deficiency Low Vitamin D level contributes to fatigue and are associated with obesity, breast, and colon cancer.  She agrees to continue to take prescription Vitamin D 50,000 IU every week and will follow-up for routine testing of Vitamin D, at least 2-3 times per year to avoid over-replacement. Check labs today.  Refill Vit D 50,000 IU weekly.   - VITAMIN D 25 Hydroxy (Vit-D Deficiency, Fractures)  3. Mixed diabetic hyperlipidemia associated with type 2 diabetes mellitus ([place]) Cardiovascular risk and specific lipid/LDL goals reviewed.  We discussed several lifestyle modifications today and Sandra Lawrence will continue to work on diet, exercise and weight loss efforts. Orders and  follow up as documented in patient record.   Refill Crestor 40 mg.  Counseling Intensive lifestyle modifications are the first line treatment for this issue. Dietary changes: Increase soluble fiber. Decrease simple carbohydrates. Exercise changes: Moderate to vigorous-intensity aerobic activity 150 minutes per week if tolerated. Lipid-lowering medications: see documented in medical record. Check labs today.  - Lipid Panel With LDL/HDL Ratio  4. Obesity BMI today is 49  Sandra Lawrence is currently in the action stage of change. As such, her goal is to continue with weight loss efforts. She has agreed to the Category 2 Plan with breakfast options.   Discussed pasta options with pt. Thanksgiving strategies discussed as well.  Exercise goals:  As is  Behavioral modification strategies: holiday eating strategies  and celebration eating strategies.  Sandra Lawrence has agreed to follow-up with our clinic in 2-3 weeks. She was informed of the importance of frequent follow-up visits to maximize her success with intensive lifestyle modifications for her multiple health conditions.   Sandra Lawrence was informed we would discuss her lab results at her next visit unless there is a critical issue that needs to be addressed sooner. Sandra Lawrence agreed to keep her next visit at the agreed upon time to discuss these results.  Objective:   Blood pressure 137/82, pulse 65, temperature 98.2 F (36.8 C), height 5\' 7"  (1.702 m), weight 254 lb (115.2 kg), SpO2 99 %. Body mass index is 39.78 kg/m.  General: Cooperative, alert, well developed, in no acute distress. HEENT: Conjunctivae and lids unremarkable. Cardiovascular: Regular rhythm.  Lungs: Normal work of breathing. Neurologic: No focal deficits.   Lab Results  Component Value Date   CREATININE 0.61 01/18/2021   BUN 21 01/18/2021   NA 136 01/18/2021   K 3.6 01/18/2021   CL 105 01/18/2021   CO2 23 01/18/2021   Lab Results  Component Value Date   ALT 16 12/18/2020    AST 17 12/18/2020   ALKPHOS 120 12/18/2020   BILITOT 0.4 12/18/2020   Lab Results  Component Value Date   HGBA1C 7.6 (H) 12/18/2020   HGBA1C 7.5 (A) 09/26/2020   HGBA1C 7.9 (A) 08/20/2019   HGBA1C 8.6 (A) 02/21/2019   HGBA1C 7.1 11/04/2016   Lab Results  Component Value Date   INSULIN 12.1 12/18/2020   Lab Results  Component Value Date   TSH 1.650 12/18/2020   Lab Results  Component Value Date   CHOL 171 12/18/2020   HDL 53 12/18/2020   LDLCALC 103 (H) 12/18/2020   TRIG 78 12/18/2020   CHOLHDL 3.8 02/21/2019   Lab Results  Component Value Date   VD25OH 23.0 (L) 12/18/2020   Lab Results  Component Value Date   WBC 6.0 01/18/2021   HGB 13.2 01/18/2021   HCT 41.4 01/18/2021   MCV 90.0 01/18/2021   PLT 260 01/18/2021   No results found for: IRON, TIBC, FERRITIN  Obesity Behavioral Intervention:   Approximately 15 minutes were spent on the discussion below.  ASK: We discussed the diagnosis of obesity with Sandra Lawrence today and Sandra Lawrence agreed to give [place] permission to discuss obesity behavioral modification therapy today.  ASSESS: Sandra Lawrence has the diagnosis of obesity and her BMI today is 39.9. Sandra Lawrence is in the action stage of change.   ADVISE: Sandra Lawrence was educated on the multiple health risks of obesity as well as the benefit of weight loss to improve her health. She was advised of the need for long term treatment and the importance of lifestyle modifications to improve her current health and to decrease her risk of future health problems.  AGREE: Multiple dietary modification options and treatment options were discussed and Sandra Lawrence agreed to follow the recommendations documented in the above note.  ARRANGE: Sandra Lawrence was educated on the importance of frequent visits to treat obesity as outlined per CMS and USPSTF guidelines and agreed to schedule her next follow up appointment today.  Attestation Statements:   Reviewed by clinician on day of visit: allergies, medications,  problem list, medical history, surgical history, family history, social history, and previous encounter notes.  Coral Ceo, CMA, am acting as transcriptionist for [employer], DO.  I have reviewed the above documentation for accuracy and completeness, and I agree with the above. Sandra Lawrence, D.O.  The [place] was signed into law in 2016 which includes the topic of electronic health records.  This provides immediate access to information in MyChart.  This includes consultation notes, operative notes, office notes, lab results and pathology reports.  If you have any questions about what you read please let us know at your next visit so we can discuss your concerns and take corrective action if need be.  We are right here with you.

## 2021-04-03 LAB — LIPID PANEL WITH LDL/HDL RATIO
Cholesterol, Total: 97 mg/dL — ABNORMAL LOW (ref 100–199)
HDL: 37 mg/dL — ABNORMAL LOW (ref >39)
LDL Chol Calc (NIH): 48 mg/dL (ref 0–99)
LDL/HDL Ratio: 1.3 ratio (ref 0.0–3.2)
Triglycerides: 50 mg/dL (ref 0–149)
VLDL Cholesterol Cal: 12 mg/dL (ref 5–40)

## 2021-04-03 LAB — COMPREHENSIVE METABOLIC PANEL
ALT: 28 IU/L (ref 0–32)
AST: 20 IU/L (ref 0–40)
Albumin/Globulin Ratio: 1.3 (ref 1.2–2.2)
Albumin: 3.9 g/dL (ref 3.7–4.7)
Alkaline Phosphatase: 133 IU/L — ABNORMAL HIGH (ref 44–121)
BUN/Creatinine Ratio: 19 (ref 12–28)
BUN: 13 mg/dL (ref 8–27)
Bilirubin Total: 0.5 mg/dL (ref 0.0–1.2)
CO2: 27 mmol/L (ref 20–29)
Calcium: 9.8 mg/dL (ref 8.7–10.3)
Chloride: 104 mmol/L (ref 96–106)
Creatinine, Ser: 0.68 mg/dL (ref 0.57–1.00)
Globulin, Total: 3.1 g/dL (ref 1.5–4.5)
Glucose: 104 mg/dL — ABNORMAL HIGH (ref 70–99)
Potassium: 4.5 mmol/L (ref 3.5–5.2)
Sodium: 143 mmol/L (ref 134–144)
Total Protein: 7 g/dL (ref 6.0–8.5)
eGFR: 92 mL/min/{1.73_m2} (ref >59)

## 2021-04-03 LAB — HEMOGLOBIN A1C
Est. average glucose Bld gHb Est-mCnc: 146 mg/dL
Hgb A1c MFr Bld: 6.7 % — ABNORMAL HIGH (ref 4.8–5.6)

## 2021-04-03 LAB — VITAMIN D 25 HYDROXY (VIT D DEFICIENCY, FRACTURES): Vit D, 25-Hydroxy: 66.4 ng/mL (ref 30.0–100.0)

## 2021-04-03 LAB — INSULIN, RANDOM: INSULIN: 7.6 u[IU]/mL (ref 2.6–24.9)

## 2021-04-07 ENCOUNTER — Other Ambulatory Visit (INDEPENDENT_AMBULATORY_CARE_PROVIDER_SITE_OTHER): Payer: Self-pay | Admitting: Physician Assistant

## 2021-04-07 DIAGNOSIS — E1169 Type 2 diabetes mellitus with other specified complication: Secondary | ICD-10-CM

## 2021-04-13 ENCOUNTER — Encounter (INDEPENDENT_AMBULATORY_CARE_PROVIDER_SITE_OTHER): Payer: Self-pay | Admitting: Family Medicine

## 2021-04-13 DIAGNOSIS — M25551 Pain in right hip: Secondary | ICD-10-CM | POA: Diagnosis not present

## 2021-04-14 MED ORDER — SEMAGLUTIDE (1 MG/DOSE) 4 MG/3ML ~~LOC~~ SOPN: 1.0000 mg | PEN_INJECTOR | SUBCUTANEOUS | 0 refills | Status: DC

## 2021-04-14 NOTE — Addendum Note (Signed)
Addended by: Wyatt Haste F on: 04/14/2021 06:41 AM   Modules accepted: Orders

## 2021-04-16 ENCOUNTER — Ambulatory Visit
Admission: RE | Admit: 2021-04-16 | Discharge: 2021-04-16 | Disposition: A | Discharge status: Home / Self Care | Payer: Medicare PPO | Source: Ambulatory Visit | Attending: Family Medicine | Admitting: Family Medicine

## 2021-04-16 DIAGNOSIS — Z1231 Encounter for screening mammogram for malignant neoplasm of breast: Secondary | ICD-10-CM | POA: Diagnosis not present

## 2021-04-22 ENCOUNTER — Other Ambulatory Visit (INDEPENDENT_AMBULATORY_CARE_PROVIDER_SITE_OTHER): Payer: Self-pay | Admitting: Family Medicine

## 2021-04-22 DIAGNOSIS — E559 Vitamin D deficiency, unspecified: Secondary | ICD-10-CM

## 2021-04-23 ENCOUNTER — Other Ambulatory Visit: Payer: Self-pay

## 2021-04-23 ENCOUNTER — Encounter (INDEPENDENT_AMBULATORY_CARE_PROVIDER_SITE_OTHER): Payer: Self-pay | Admitting: Family Medicine

## 2021-04-23 ENCOUNTER — Ambulatory Visit (INDEPENDENT_AMBULATORY_CARE_PROVIDER_SITE_OTHER): Payer: Medicare PPO | Admitting: Family Medicine

## 2021-04-23 ENCOUNTER — Other Ambulatory Visit (HOSPITAL_COMMUNITY): Payer: Self-pay

## 2021-04-23 VITALS — BP 132/65 | HR 58 | Temp 98.0°F | Ht 67.0 in | Wt 250.0 lb

## 2021-04-23 DIAGNOSIS — I119 Hypertensive heart disease without heart failure: Secondary | ICD-10-CM

## 2021-04-23 DIAGNOSIS — Z6841 Body Mass Index (BMI) 40.0 and over, adult: Secondary | ICD-10-CM | POA: Diagnosis not present

## 2021-04-23 DIAGNOSIS — E1169 Type 2 diabetes mellitus with other specified complication: Secondary | ICD-10-CM | POA: Diagnosis not present

## 2021-04-23 DIAGNOSIS — E559 Vitamin D deficiency, unspecified: Secondary | ICD-10-CM

## 2021-04-23 DIAGNOSIS — E782 Mixed hyperlipidemia: Secondary | ICD-10-CM | POA: Diagnosis not present

## 2021-04-23 DIAGNOSIS — I152 Hypertension secondary to endocrine disorders: Secondary | ICD-10-CM

## 2021-04-23 MED ORDER — VITAMIN D (ERGOCALCIFEROL) 1.25 MG (50000 UNIT) PO CAPS: 50000.0000 [IU] | ORAL_CAPSULE | ORAL | 0 refills | Status: DC

## 2021-04-23 MED ORDER — TIRZEPATIDE 2.5 MG/0.5ML ~~LOC~~ SOAJ
2.5000 mg | SUBCUTANEOUS | 0 refills | Status: DC
  Filled 2021-04-23: qty 2, 28d supply, fill #0

## 2021-04-23 MED ORDER — ROSUVASTATIN CALCIUM 40 MG PO TABS: 40.0000 mg | ORAL_TABLET | Freq: Every day | ORAL | 0 refills | Status: DC

## 2021-04-23 MED ORDER — TIRZEPATIDE 2.5 MG/0.5ML ~~LOC~~ SOAJ
2.5000 mg | SUBCUTANEOUS | 0 refills | Status: DC
  Filled 2021-04-23 – 2021-04-24 (×3): qty 2, 28d supply, fill #0

## 2021-04-24 ENCOUNTER — Other Ambulatory Visit: Payer: Self-pay

## 2021-04-24 ENCOUNTER — Other Ambulatory Visit (HOSPITAL_COMMUNITY): Payer: Self-pay

## 2021-04-24 NOTE — Patient Outreach (Signed)
[facility]) Care Management  04/24/2021  Sandra Lawrence [birth year] [id number]   Telephone call to patient for disease management follow up.   No answer.  HIPAA compliant voice message left.    Plan: If no return call, RN CM will attempt patient again in the month of January.    [name], RN, MSN [facility] Care Management Coordinator Direct Line [phone] Cell [phone] Toll Free: [phone]  Fax: [phone]

## 2021-04-27 NOTE — Progress Notes (Signed)
Chief Complaint:   OBESITY Sandra Lawrence is here to discuss her progress with her obesity treatment plan along with follow-up of her obesity related diagnoses. Sandra Lawrence is on the Category 2 Plan and states she is following her eating plan approximately 80% of the time. Sandra Lawrence states she is not currently exercising.  Today's visit was #: 8 Starting weight: 286 lbs Starting date: 12/18/2020 Today's weight: 250 lbs Today's date: 04/23/2021 Total lbs lost to date: 36 Total lbs lost since last in-office visit: 4  Interim History: Sandra Lawrence is here for a follow up office visit. We reviewed her meal plan and questions were answered. Patient's food recall appears to be accurate and consistent with what is on plan when she is following it. When eating on plan, her hunger and cravings are well controlled.    Subjective:   1. Type 2 diabetes mellitus with other specified complication, without long-term current use of insulin (HCC) Sandra Lawrence's Ozempic was never approved by her insurance. Her fasting blood sugars range between 100-120, but no higher. She is still taking her Trulicity.  2. Vitamin D deficiency [place] is currently taking prescription vitamin D 50,000 IU each week. She denies nausea, vomiting or muscle weakness.  3. Mixed diabetic hyperlipidemia associated with type 2 diabetes mellitus (Sandra Lawrence) Sandra Lawrence has hyperlipidemia and has been trying to improve her cholesterol levels with intensive lifestyle modification including a low saturated fat diet, exercise and weight loss. She denies any chest pain, claudication or myalgias.  Assessment/Plan:  No orders of the defined types were placed in this encounter.   Medications Discontinued During This Encounter  Medication Reason   Semaglutide, 1 MG/DOSE, 4 MG/3ML SOPN    rosuvastatin (CRESTOR) 40 MG tablet Reorder   Vitamin D, Ergocalciferol, (DRISDOL) 1.25 MG (50000 UNIT) CAPS capsule Reorder   tirzepatide (MOUNJARO) 2.5 MG/0.5ML Pen      Meds  ordered this encounter  Medications   rosuvastatin (CRESTOR) 40 MG tablet    Sig: Take 1 tablet (40 mg total) by mouth at bedtime.    Dispense:  30 tablet    Refill:  0    Ov for RF   Vitamin D, Ergocalciferol, (DRISDOL) 1.25 MG (50000 UNIT) CAPS capsule    Sig: Take 1 capsule (50,000 Units total) by mouth every 7 (seven) days.    Dispense:  4 capsule    Refill:  0    Ov for RF; 30 d supply   DISCONTD: tirzepatide (MOUNJARO) 2.5 MG/0.5ML Pen    Sig: Inject 2.5 mg into the skin once a week.    Dispense:  2 mL    Refill:  0    Pt is Diabetic and is to use coupon once insurance denies coverage of this med please   tirzepatide (MOUNJARO) 2.5 MG/0.5ML Pen    Sig: Inject 2.5 mg into the skin once a week.    Dispense:  2 mL    Refill:  0    Pt is Diabetic and is to use coupon once insurance denies coverage of this med please     1. Type 2 diabetes mellitus with other specified complication, without long-term current use of insulin (Sandra Lawrence) Sandra Lawrence agreed to start Mounjaro 2.5 mg weekly with no refills. Risks and benefits of the medication was discussed with the patient. Good blood sugar control is important to decrease the likelihood of diabetic complications such as nephropathy, neuropathy, limb loss, blindness, coronary artery disease, and death. Intensive lifestyle modification including diet, exercise and weight  loss are the first line of treatment for diabetes.   - tirzepatide [facility]) 2.5 MG/0.5ML Pen; Inject 2.5 mg into the skin once a week.  Dispense: 2 mL; Refill: 0  2. Vitamin D deficiency Low Vitamin D level contributes to fatigue and are associated with obesity, breast, and colon cancer. We will refill prescription Vitamin D for 1 month. Sandra Lawrence will follow-up for routine testing of Vitamin D, at least 2-3 times per year to avoid over-replacement.  - Vitamin D, Ergocalciferol, (DRISDOL) 1.25 MG (50000 UNIT) CAPS capsule; Take 1 capsule (50,000 Units total) by mouth every 7 (seven)  days.  Dispense: 4 capsule; Refill: 0  3. Mixed diabetic hyperlipidemia associated with type 2 diabetes mellitus (Sandra Lawrence) Cardiovascular risk and specific lipid/LDL goals reviewed. We discussed several lifestyle modifications today. We will refill Crestor for 1 month. Sandra Lawrence will continue to work on diet, exercise and weight loss efforts. Orders and follow up as documented in patient record.   - rosuvastatin (CRESTOR) 40 MG tablet; Take 1 tablet (40 mg total) by mouth at bedtime.  Dispense: 30 tablet; Refill: 0 - tirzepatide (MOUNJARO) 2.5 MG/0.5ML Pen; Inject 2.5 mg into the skin once a week.  Dispense: 2 mL; Refill: 0  4. Obesity with current BMI of 39.3 Sandra Lawrence is currently in the action stage of change. As such, her goal is to continue with weight loss efforts. She has agreed to the Category 2 Plan.   Exercise goals: As is.  Behavioral modification strategies: increasing lean protein intake, decreasing simple carbohydrates, and planning for success.  Sandra Lawrence has agreed to follow-up with our clinic in 2 weeks. She was informed of the importance of frequent follow-up visits to maximize her success with intensive lifestyle modifications for her multiple health conditions.   Objective:   Blood pressure 132/65, pulse (!) 58, temperature 98 F (36.7 C), height 5\' 7"  (1.702 m), weight 250 lb (113.4 kg), SpO2 98 %. Body mass index is 39.16 kg/m.  General: Cooperative, alert, well developed, in no acute distress. HEENT: Conjunctivae and lids unremarkable. Cardiovascular: Regular rhythm.  Lungs: Normal work of breathing. Neurologic: No focal deficits.   Lab Results  Component Value Date   CREATININE 0.68 04/02/2021   BUN 13 04/02/2021   NA 143 04/02/2021   K 4.5 04/02/2021   CL 104 04/02/2021   CO2 27 04/02/2021   Lab Results  Component Value Date   ALT 28 04/02/2021   AST 20 04/02/2021   ALKPHOS 133 (H) 04/02/2021   BILITOT 0.5 04/02/2021   Lab Results  Component Value Date    HGBA1C 6.7 (H) 04/02/2021   HGBA1C 7.6 (H) 12/18/2020   HGBA1C 7.5 (A) 09/26/2020   HGBA1C 7.9 (A) 08/20/2019   HGBA1C 8.6 (A) 02/21/2019   Lab Results  Component Value Date   INSULIN 7.6 04/02/2021   INSULIN 12.1 12/18/2020   Lab Results  Component Value Date   TSH 1.650 12/18/2020   Lab Results  Component Value Date   CHOL 97 (L) 04/02/2021   HDL 37 (L) 04/02/2021   LDLCALC 48 04/02/2021   TRIG 50 04/02/2021   CHOLHDL 3.8 02/21/2019   Lab Results  Component Value Date   VD25OH 66.4 04/02/2021   VD25OH 23.0 (L) 12/18/2020   Lab Results  Component Value Date   WBC 6.0 01/18/2021   HGB 13.2 01/18/2021   HCT 41.4 01/18/2021   MCV 90.0 01/18/2021   PLT 260 01/18/2021   No results found for: IRON, TIBC, FERRITIN  Obesity Behavioral  Intervention:   Approximately 15 minutes were spent on the discussion below.  ASK: We discussed the diagnosis of obesity with Sandra Lawrence today and Sandra Lawrence agreed to give [place] permission to discuss obesity behavioral modification therapy today.  ASSESS: Sandra Lawrence has the diagnosis of obesity and her BMI today is 39.3. Sandra Lawrence is in the action stage of change.   ADVISE: Sandra Lawrence was educated on the multiple health risks of obesity as well as the benefit of weight loss to improve her health. She was advised of the need for long term treatment and the importance of lifestyle modifications to improve her current health and to decrease her risk of future health problems.  AGREE: Multiple dietary modification options and treatment options were discussed and Sandra Lawrence agreed to follow the recommendations documented in the above note.  ARRANGE: Sandra Lawrence was educated on the importance of frequent visits to treat obesity as outlined per CMS and USPSTF guidelines and agreed to schedule her next follow up appointment today.  Attestation Statements:   Reviewed by clinician on day of visit: allergies, medications, problem list, medical history, surgical history, family  history, social history, and previous encounter notes.   Wilhemena Durie, am acting as transcriptionist for [employer], DO.  I have reviewed the above documentation for accuracy and completeness, and I agree with the above. Marjory Sneddon, D.O.  The [place] was signed into law in 2016 which includes the topic of electronic health records.  This provides immediate access to information in MyChart.  This includes consultation notes, operative notes, office notes, lab results and pathology reports.  If you have any questions about what you read please let us know at your next visit so we can discuss your concerns and take corrective action if need be.  We are right here with you.

## 2021-04-28 ENCOUNTER — Other Ambulatory Visit (HOSPITAL_COMMUNITY): Payer: Self-pay

## 2021-05-07 ENCOUNTER — Ambulatory Visit (INDEPENDENT_AMBULATORY_CARE_PROVIDER_SITE_OTHER): Payer: Medicare PPO | Admitting: Family Medicine

## 2021-05-07 ENCOUNTER — Other Ambulatory Visit: Payer: Self-pay

## 2021-05-07 ENCOUNTER — Encounter (INDEPENDENT_AMBULATORY_CARE_PROVIDER_SITE_OTHER): Payer: Self-pay | Admitting: Family Medicine

## 2021-05-07 VITALS — BP 134/80 | HR 99 | Temp 98.2°F | Ht 67.0 in | Wt 252.0 lb

## 2021-05-07 DIAGNOSIS — Z6839 Body mass index (BMI) 39.0-39.9, adult: Secondary | ICD-10-CM | POA: Diagnosis not present

## 2021-05-07 DIAGNOSIS — I152 Hypertension secondary to endocrine disorders: Secondary | ICD-10-CM | POA: Diagnosis not present

## 2021-05-07 DIAGNOSIS — M25551 Pain in right hip: Secondary | ICD-10-CM | POA: Diagnosis not present

## 2021-05-07 DIAGNOSIS — E559 Vitamin D deficiency, unspecified: Secondary | ICD-10-CM

## 2021-05-07 DIAGNOSIS — M167 Other unilateral secondary osteoarthritis of hip: Secondary | ICD-10-CM

## 2021-05-07 DIAGNOSIS — E782 Mixed hyperlipidemia: Secondary | ICD-10-CM

## 2021-05-07 DIAGNOSIS — E1169 Type 2 diabetes mellitus with other specified complication: Secondary | ICD-10-CM

## 2021-05-07 DIAGNOSIS — E1159 Type 2 diabetes mellitus with other circulatory complications: Secondary | ICD-10-CM

## 2021-05-07 DIAGNOSIS — Z6841 Body Mass Index (BMI) 40.0 and over, adult: Secondary | ICD-10-CM

## 2021-05-07 MED ORDER — VITAMIN D (ERGOCALCIFEROL) 1.25 MG (50000 UNIT) PO CAPS: 50000.0000 [IU] | ORAL_CAPSULE | ORAL | 0 refills | Status: DC

## 2021-05-07 MED ORDER — ROSUVASTATIN CALCIUM 20 MG PO TABS: 20.0000 mg | ORAL_TABLET | Freq: Every day | ORAL | 0 refills | Status: DC

## 2021-05-09 ENCOUNTER — Other Ambulatory Visit (INDEPENDENT_AMBULATORY_CARE_PROVIDER_SITE_OTHER): Payer: Self-pay | Admitting: Family Medicine

## 2021-05-09 DIAGNOSIS — E782 Mixed hyperlipidemia: Secondary | ICD-10-CM

## 2021-05-18 NOTE — Progress Notes (Signed)
Chief Complaint:   OBESITY Sandra Lawrence is here to discuss her progress with her obesity treatment plan along with follow-up of her obesity related diagnoses. Sandra Lawrence is on the Category 2 Plan and states she is following her eating plan approximately 80% of the time. Sandra Lawrence states she is not currently exercising.  Today's visit was #: 9 Starting weight: 286 lbs Starting date: 12/18/2020 Today's weight: 252 lbs Today's date: 05/07/2021 Total lbs lost to date: 34 Total lbs lost since last in-office visit: +2  Interim History: Sandra Lawrence denies hunger or cravings. She is not able to eat all her food on plan and often skips meals and/or foods.   Subjective:   1. Mixed diabetic hyperlipidemia associated with type 2 diabetes mellitus ([place])  Discussed labs with patient today.  Pt's LDL is much improved after changing to Crestor 40 mg from pravastatin max dose.   2. Type 2 diabetes mellitus with other specified complication, without long-term current use of insulin ([place])  Discussed labs with patient today.  FBS= 96-112. Pt reports no lows or highs and denies symptoms.   3. Hypertension associated with type 2 diabetes mellitus ([place])  Discussed labs with patient today.  She is checking her BP a couple of times a week- 136/65, 140/78 is the highest usually at home.  Take lopressor and Hyzaar.   4. Other secondary osteoarthritis of right hip  Pt went to ortho today and was told she qualifies for R hip replacement not with current BMI.   She wishes to lose more weight and delay surgery a bit.   5. Vitamin D deficiency  Discussed labs with patient today.  She is currently taking prescription vitamin D 50,000 IU each week. She denies nausea, vomiting or muscle weakness.   Assessment/Plan:  No orders of the defined types were placed in this encounter.   Medications Discontinued During This Encounter  Medication Reason   tirzepatide [facility]) 2.5 MG/0.5ML Pen    rosuvastatin  (CRESTOR) 40 MG tablet Reorder   Vitamin D, Ergocalciferol, (DRISDOL) 1.25 MG (50000 UNIT) CAPS capsule Reorder     Meds ordered this encounter  Medications   Vitamin D, Ergocalciferol, (DRISDOL) 1.25 MG (50000 UNIT) CAPS capsule    Sig: Take 1 capsule (50,000 Units total) by mouth every 7 (seven) days.    Dispense:  4 capsule    Refill:  0    Ov for RF; 30 d supply   rosuvastatin (CRESTOR) 20 MG tablet    Sig: Take 1 tablet (20 mg total) by mouth at bedtime.    Dispense:  90 tablet    Refill:  0    Ov for RF     1. Mixed diabetic hyperlipidemia associated with type 2 diabetes mellitus (HCC) Chol panel under very good control and total chol= 97 also due to low HDL levels.   Trial Crestor 20 mg qhs and recheck in 3 months. Continue prudent nutritional plan and start walking.  Decrease & Refill- rosuvastatin (CRESTOR) 20 MG tablet; Take 1 tablet (20 mg total) by mouth at bedtime.  Dispense: 90 tablet; Refill: 0  2. Type 2 diabetes mellitus with other specified complication, without long-term current use of insulin (HCC) A1c at goal of <7.0. Continue current treatment plan.   Pt never started [facility] script given to her 04/23/21 to aid in wt loss- Will d/c it from her med list.   Continue prudent nutritional plan and weight loss.  3. Hypertension associated with type 2 diabetes mellitus ([place])  At goal. Sandra Lawrence is working on healthy weight loss and exercise to improve blood pressure control. We will watch for signs of hypotension as she continues her lifestyle modifications.  4. Other secondary osteoarthritis of right hip Continue current treatment plan as per ortho.  Activity levels- per ortho, she can walk as tolerated.  5. Vitamin D deficiency Low Vitamin D level contributes to fatigue and are associated with obesity, breast, and colon cancer. She agrees to continue to take prescription Vitamin D 50,000 IU every week and will follow-up for routine testing of Vitamin D, at least 2-3  times per year to avoid over-replacement.  Refill- Vitamin D, Ergocalciferol, (DRISDOL) 1.25 MG (50000 UNIT) CAPS capsule; Take 1 capsule (50,000 Units total) by mouth every 7 (seven) days.  Dispense: 4 capsule; Refill: 0  6. Obesity with current BMI of 39.5  Sandra Lawrence is currently in the action stage of change. As such, her goal is to continue with weight loss efforts. She has agreed to the Category 2 Plan.   Exercise goals:  Start walking 5 minutes a day with walker.  Behavioral modification strategies: increasing lean protein intake, decreasing simple carbohydrates, holiday eating strategies , and avoiding temptations.  Sandra Lawrence has agreed to follow-up with our clinic in 3 weeks. She was informed of the importance of frequent follow-up visits to maximize her success with intensive lifestyle modifications for her multiple health conditions.    Objective:   Blood pressure 134/80, pulse 99, temperature 98.2 F (36.8 C), height 5\' 7"  (1.702 m), weight 252 lb (114.3 kg), SpO2 99 %. Body mass index is 39.47 kg/m.  General: Cooperative, alert, well developed, in no acute distress. HEENT: Conjunctivae and lids unremarkable. Cardiovascular: Regular rhythm.  Lungs: Normal work of breathing. Neurologic: No focal deficits.   Lab Results  Component Value Date   CREATININE 0.68 04/02/2021   BUN 13 04/02/2021   NA 143 04/02/2021   K 4.5 04/02/2021   CL 104 04/02/2021   CO2 27 04/02/2021   Lab Results  Component Value Date   ALT 28 04/02/2021   AST 20 04/02/2021   ALKPHOS 133 (H) 04/02/2021   BILITOT 0.5 04/02/2021   Lab Results  Component Value Date   HGBA1C 6.7 (H) 04/02/2021   HGBA1C 7.6 (H) 12/18/2020   HGBA1C 7.5 (A) 09/26/2020   HGBA1C 7.9 (A) 08/20/2019   HGBA1C 8.6 (A) 02/21/2019   Lab Results  Component Value Date   INSULIN 7.6 04/02/2021   INSULIN 12.1 12/18/2020   Lab Results  Component Value Date   TSH 1.650 12/18/2020   Lab Results  Component Value Date   CHOL  97 (L) 04/02/2021   HDL 37 (L) 04/02/2021   LDLCALC 48 04/02/2021   TRIG 50 04/02/2021   CHOLHDL 3.8 02/21/2019   Lab Results  Component Value Date   VD25OH 66.4 04/02/2021   VD25OH 23.0 (L) 12/18/2020   Lab Results  Component Value Date   WBC 6.0 01/18/2021   HGB 13.2 01/18/2021   HCT 41.4 01/18/2021   MCV 90.0 01/18/2021   PLT 260 01/18/2021    Attestation Statements:   Reviewed by clinician on day of visit: allergies, medications, problem list, medical history, surgical history, family history, social history, and previous encounter notes.  Time spent on visit including pre-visit chart review and post-visit care and charting was 40 minutes.   Coral Ceo, CMA, am acting as transcriptionist for [employer], DO.  I have reviewed the above documentation for accuracy and completeness, and  I agree with the above. Marjory Sneddon, D.O.  The [place] was signed into law in 2016 which includes the topic of electronic health records.  This provides immediate access to information in MyChart.  This includes consultation notes, operative notes, office notes, lab results and pathology reports.  If you have any questions about what you read please let us know at your next visit so we can discuss your concerns and take corrective action if need be.  We are right here with you.

## 2021-05-25 ENCOUNTER — Ambulatory Visit (INDEPENDENT_AMBULATORY_CARE_PROVIDER_SITE_OTHER): Payer: Medicare PPO | Admitting: Family Medicine

## 2021-05-27 ENCOUNTER — Encounter (INDEPENDENT_AMBULATORY_CARE_PROVIDER_SITE_OTHER): Payer: Self-pay | Admitting: Adult Health

## 2021-05-27 ENCOUNTER — Other Ambulatory Visit: Payer: Self-pay

## 2021-05-27 ENCOUNTER — Ambulatory Visit (INDEPENDENT_AMBULATORY_CARE_PROVIDER_SITE_OTHER): Payer: Medicare PPO | Admitting: Adult Health

## 2021-05-27 VITALS — BP 151/83 | HR 60 | Temp 98.0°F | Ht 67.0 in | Wt 249.0 lb

## 2021-05-27 DIAGNOSIS — Z6839 Body mass index (BMI) 39.0-39.9, adult: Secondary | ICD-10-CM | POA: Diagnosis not present

## 2021-05-27 DIAGNOSIS — M167 Other unilateral secondary osteoarthritis of hip: Secondary | ICD-10-CM | POA: Diagnosis not present

## 2021-05-27 DIAGNOSIS — E1169 Type 2 diabetes mellitus with other specified complication: Secondary | ICD-10-CM | POA: Diagnosis not present

## 2021-05-27 DIAGNOSIS — E782 Mixed hyperlipidemia: Secondary | ICD-10-CM

## 2021-05-27 DIAGNOSIS — I10 Essential (primary) hypertension: Secondary | ICD-10-CM

## 2021-05-27 DIAGNOSIS — E66813 Obesity, class 3: Secondary | ICD-10-CM

## 2021-05-27 HISTORY — DX: Essential (primary) hypertension: I10

## 2021-05-27 NOTE — Progress Notes (Signed)
Chief Complaint:   OBESITY Sandra Lawrence is here to discuss her progress with her obesity treatment plan along with follow-up of her obesity related diagnoses. Sandra Lawrence is on the Category 2 Plan and states she is following her eating plan approximately 50% of the time. Sandra Lawrence states she is not exercising regularly at this time.  Today's visit was #: 10 Starting weight: 286 lbs Starting date: 12/18/2020 Today's weight: 249 lbs Today's date: 05/27/2021 Total lbs lost to date: 37 lbs Total lbs lost since last in-office visit: 3 lbs  Interim History:  Sandra Lawrence retired at age 76 due to myriad of health concerns - uncontrolled T2D, stress, diffuse osteoarthritis pain.  Current BMI 39.1 - has follow-up with Dr. [facility] tomorrow to discuss proceeding with total R hip replacement.   Subjective:   1. Other secondary osteoarthritis of right hip She ambulates with 2 canes.  She denies recent falls.   Has follow-up with [facility], Dr. French Ana tomorrow-discuss proceeding with total R hip replacement. Current BMI 39.1  2. Type 2 diabetes mellitus with other specified complication, without long-term current use of insulin ([place]) On 04/23/2021, given Rx for Mounjaro 2.5 mg - however she never started the GIP/GLP-1 therapy due to insurance denial. On 04/02/2021, A1c 6.7 - at goal. FB 97-110, denies sx's of hypoglycemia.  3. Mixed diabetic hyperlipidemia associated with type 2 diabetes mellitus (HCC) LDL significantly decreased after max dose pravastatin was replaced with Crestor 40 mg.   Crestor was decreased from 40 mg to 20 mg at last office visit on 05/07/2021. She denies myalgia's.  4. Essential hypertension Home reading SBP 130s, DBP 70-80. She denies acute cardiac symptoms. She is on Hyzaar 100/12.5 mg daily, lopressor 50 mg BID.  Assessment/Plan:   1. Other secondary osteoarthritis of right hip Continue with weight loss efforts. F/u with Orthopedic surgeon tomorrow  to discuss proceeding with total R hip replacement.  2. Type 2 diabetes mellitus with other specified complication, without long-term current use of insulin (Sandra Lawrence) Continue with healthy eating, monitor labs.  3. Mixed diabetic hyperlipidemia associated with type 2 diabetes mellitus (HCC) Continue on Crestor 20 mg daily.  4. Essential hypertension Continue antihypertensive regimen.  5. Obesity with current BMI of 39.1  Sandra Lawrence is currently in the action stage of change. As such, her goal is to continue with weight loss efforts. She has agreed to the Category 2 Plan.   Exercise goals: No exercise has been prescribed at this time.  Behavioral modification strategies: increasing lean protein intake, decreasing simple carbohydrates, meal planning and cooking strategies, keeping healthy foods in the home, and planning for success.  Sandra Lawrence has agreed to follow-up with our clinic in 3 weeks. She was informed of the importance of frequent follow-up visits to maximize her success with intensive lifestyle modifications for her multiple health conditions.   Objective:   Blood pressure (!) 151/83, pulse 60, temperature 98 F (36.7 C), height 5' 7"  (1.702 m), weight 249 lb (112.9 kg), SpO2 100 %. Body mass index is 39 kg/m.  General: Cooperative, alert, well developed, in no acute distress. HEENT: Conjunctivae and lids unremarkable. Cardiovascular: Regular rhythm.  Lungs: Normal work of breathing. Neurologic: No focal deficits.   Lab Results  Component Value Date   CREATININE 0.68 04/02/2021   BUN 13 04/02/2021   NA 143 04/02/2021   K 4.5 04/02/2021   CL 104 04/02/2021   CO2 27 04/02/2021   Lab Results  Component Value Date   ALT 28 04/02/2021  AST 20 04/02/2021   ALKPHOS 133 (H) 04/02/2021   BILITOT 0.5 04/02/2021   Lab Results  Component Value Date   HGBA1C 6.7 (H) 04/02/2021   HGBA1C 7.6 (H) 12/18/2020   HGBA1C 7.5 (A) 09/26/2020   HGBA1C 7.9 (A) 08/20/2019   HGBA1C 8.6 (A)  02/21/2019   Lab Results  Component Value Date   INSULIN 7.6 04/02/2021   INSULIN 12.1 12/18/2020   Lab Results  Component Value Date   TSH 1.650 12/18/2020   Lab Results  Component Value Date   CHOL 97 (L) 04/02/2021   HDL 37 (L) 04/02/2021   LDLCALC 48 04/02/2021   TRIG 50 04/02/2021   CHOLHDL 3.8 02/21/2019   Lab Results  Component Value Date   VD25OH 66.4 04/02/2021   VD25OH 23.0 (L) 12/18/2020   Lab Results  Component Value Date   WBC 6.0 01/18/2021   HGB 13.2 01/18/2021   HCT 41.4 01/18/2021   MCV 90.0 01/18/2021   PLT 260 01/18/2021   Attestation Statements:   Reviewed by clinician on day of visit: allergies, medications, problem list, medical history, surgical history, family history, social history, and previous encounter notes.  Time spent on visit including pre-visit chart review and post-visit care and charting was 28 minutes.   I, Water quality scientist, CMA, am acting as Location manager for Mina Marble, NP.  I have reviewed the above documentation for accuracy and completeness, and I agree with the above. -  Recie Cirrincione d. Corben Auzenne, NP-C

## 2021-05-28 ENCOUNTER — Other Ambulatory Visit: Payer: Self-pay

## 2021-05-28 ENCOUNTER — Telehealth: Payer: Self-pay

## 2021-05-28 DIAGNOSIS — M25551 Pain in right hip: Secondary | ICD-10-CM | POA: Diagnosis not present

## 2021-05-28 NOTE — Telephone Encounter (Signed)
° °  Pre-operative Risk Assessment    Patient Name: Sandra Lawrence  DOB: 04/12/1946 MRN: [id number]      Request for Surgical Clearance    Procedure:   Right total hip replacement  Date of Surgery:  Clearance TBD                                 Surgeon:  Dr. Earlie Server Surgeon's Group or Practice Name:  [facility] Phone number:  [phone] Fax number:  [phone] Attention Claiborne Billings    Type of Clearance Requested:   - Medical    Type of Anesthesia:  Not Indicated   Additional requests/questions:    SignedLowella Grip   05/28/2021, 2:52 PM

## 2021-05-28 NOTE — Patient Outreach (Signed)
[facility] Care Management  05/28/2021  Sandra Lawrence 14-Dec-1945 [id number]  CMA made second attempt to outreach patient regarding her upcoming appointment with [place], RN. Call was unsuccessful. CMA will attempt to outreach again to cancel upcoming appointment on 05/29/20.   Ina Homes [facility] Management Assistant [phone]

## 2021-05-28 NOTE — Patient Outreach (Signed)
[facility]) Care Management  05/28/2021  ADILEN PAVELKO 02-05-1946 [id number]  CMA made telephone outreach to cancel patients upcoming appointment with [place], RN. Call was unsuccessful. CMA will attempt to outreach again to cancel upcoming appointment.  Ina Homes [facility] Management Assistant [phone]

## 2021-05-29 ENCOUNTER — Ambulatory Visit: Payer: Self-pay

## 2021-05-29 NOTE — Telephone Encounter (Signed)
° °  Name: Sandra Lawrence  DOB: 1945/11/27  MRN: [id number]  Primary Cardiologist: Jenean Lindau, MD  Chart reviewed as part of pre-operative protocol coverage. Because of [name]'s past medical history and time since last visit, she will require a follow-up visit in order to better assess preoperative cardiovascular risk.  Pre-op covering staff: - Please schedule appointment and call patient to inform them. If patient already had an upcoming appointment within acceptable timeframe, please add "pre-op clearance" to the appointment notes so provider is aware. - Please contact requesting surgeon's office via preferred method (i.e, phone, fax) to inform them of need for appointment prior to surgery.  If applicable, this message will also be routed to pharmacy pool and/or primary cardiologist for input on holding anticoagulant/antiplatelet agent as requested below so that this information is available to the clearing provider at time of patient's appointment.   [place], [place]  05/29/2021, 1:08 PM

## 2021-05-29 NOTE — Telephone Encounter (Signed)
Pt has been scheduled to see Dr. Geraldo Pitter, 06/02/21 and surgical clearance will be addressed at that time.  Will route back to the requesting surgeon's office to make them aware.

## 2021-06-01 ENCOUNTER — Other Ambulatory Visit: Payer: Self-pay

## 2021-06-01 DIAGNOSIS — M719 Bursopathy, unspecified: Secondary | ICD-10-CM | POA: Insufficient documentation

## 2021-06-01 DIAGNOSIS — J45909 Unspecified asthma, uncomplicated: Secondary | ICD-10-CM | POA: Insufficient documentation

## 2021-06-01 DIAGNOSIS — M810 Age-related osteoporosis without current pathological fracture: Secondary | ICD-10-CM

## 2021-06-01 DIAGNOSIS — Z973 Presence of spectacles and contact lenses: Secondary | ICD-10-CM | POA: Insufficient documentation

## 2021-06-01 DIAGNOSIS — K219 Gastro-esophageal reflux disease without esophagitis: Secondary | ICD-10-CM | POA: Insufficient documentation

## 2021-06-01 DIAGNOSIS — E7800 Pure hypercholesterolemia, unspecified: Secondary | ICD-10-CM | POA: Insufficient documentation

## 2021-06-01 DIAGNOSIS — F32A Depression, unspecified: Secondary | ICD-10-CM | POA: Insufficient documentation

## 2021-06-01 DIAGNOSIS — D649 Anemia, unspecified: Secondary | ICD-10-CM | POA: Insufficient documentation

## 2021-06-01 DIAGNOSIS — I493 Ventricular premature depolarization: Secondary | ICD-10-CM | POA: Insufficient documentation

## 2021-06-01 DIAGNOSIS — E739 Lactose intolerance, unspecified: Secondary | ICD-10-CM | POA: Insufficient documentation

## 2021-06-01 DIAGNOSIS — D179 Benign lipomatous neoplasm, unspecified: Secondary | ICD-10-CM | POA: Insufficient documentation

## 2021-06-01 DIAGNOSIS — E669 Obesity, unspecified: Secondary | ICD-10-CM | POA: Insufficient documentation

## 2021-06-01 DIAGNOSIS — R002 Palpitations: Secondary | ICD-10-CM | POA: Insufficient documentation

## 2021-06-01 DIAGNOSIS — D126 Benign neoplasm of colon, unspecified: Secondary | ICD-10-CM | POA: Insufficient documentation

## 2021-06-01 DIAGNOSIS — I509 Heart failure, unspecified: Secondary | ICD-10-CM | POA: Insufficient documentation

## 2021-06-01 DIAGNOSIS — K08109 Complete loss of teeth, unspecified cause, unspecified class: Secondary | ICD-10-CM | POA: Insufficient documentation

## 2021-06-01 DIAGNOSIS — T7840XA Allergy, unspecified, initial encounter: Secondary | ICD-10-CM | POA: Insufficient documentation

## 2021-06-01 DIAGNOSIS — M2550 Pain in unspecified joint: Secondary | ICD-10-CM | POA: Insufficient documentation

## 2021-06-01 DIAGNOSIS — Z5189 Encounter for other specified aftercare: Secondary | ICD-10-CM | POA: Insufficient documentation

## 2021-06-01 DIAGNOSIS — R011 Cardiac murmur, unspecified: Secondary | ICD-10-CM | POA: Insufficient documentation

## 2021-06-01 DIAGNOSIS — R600 Localized edema: Secondary | ICD-10-CM | POA: Insufficient documentation

## 2021-06-01 DIAGNOSIS — Z972 Presence of dental prosthetic device (complete) (partial): Secondary | ICD-10-CM | POA: Insufficient documentation

## 2021-06-01 DIAGNOSIS — F419 Anxiety disorder, unspecified: Secondary | ICD-10-CM | POA: Insufficient documentation

## 2021-06-01 DIAGNOSIS — K5790 Diverticulosis of intestine, part unspecified, without perforation or abscess without bleeding: Secondary | ICD-10-CM | POA: Insufficient documentation

## 2021-06-01 DIAGNOSIS — N189 Chronic kidney disease, unspecified: Secondary | ICD-10-CM | POA: Insufficient documentation

## 2021-06-01 DIAGNOSIS — I10 Essential (primary) hypertension: Secondary | ICD-10-CM | POA: Insufficient documentation

## 2021-06-01 HISTORY — DX: Age-related osteoporosis without current pathological fracture: M81.0

## 2021-06-02 ENCOUNTER — Other Ambulatory Visit: Payer: Self-pay

## 2021-06-02 ENCOUNTER — Ambulatory Visit: Payer: Medicare PPO | Admitting: Cardiology

## 2021-06-02 ENCOUNTER — Encounter: Payer: Self-pay | Admitting: Cardiology

## 2021-06-02 VITALS — BP 150/62 | HR 70 | Ht 67.0 in | Wt 256.6 lb

## 2021-06-02 DIAGNOSIS — Z6841 Body Mass Index (BMI) 40.0 and over, adult: Secondary | ICD-10-CM

## 2021-06-02 DIAGNOSIS — I10 Essential (primary) hypertension: Secondary | ICD-10-CM | POA: Diagnosis not present

## 2021-06-02 DIAGNOSIS — Z01810 Encounter for preprocedural cardiovascular examination: Secondary | ICD-10-CM | POA: Diagnosis not present

## 2021-06-02 DIAGNOSIS — I2510 Atherosclerotic heart disease of native coronary artery without angina pectoris: Secondary | ICD-10-CM

## 2021-06-02 DIAGNOSIS — E1169 Type 2 diabetes mellitus with other specified complication: Secondary | ICD-10-CM

## 2021-06-02 DIAGNOSIS — G4730 Sleep apnea, unspecified: Secondary | ICD-10-CM

## 2021-06-02 DIAGNOSIS — E782 Mixed hyperlipidemia: Secondary | ICD-10-CM | POA: Diagnosis not present
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
## 2021-06-02 NOTE — Patient Instructions (Signed)
Medication Instructions:  Your physician recommends that you continue on your current medications as directed. Please refer to the Current Medication list given to you today.  *If you need a refill on your cardiac medications before your next appointment, please call your pharmacy*   Lab Work: None ordered If you have labs (blood work) drawn today and your tests are completely normal, you will receive your results only by: MyChart Message (if you have MyChart) OR A paper copy in the mail If you have any lab test that is abnormal or we need to change your treatment, we will call you to review the results.   Testing/Procedures: Your physician has requested that you have a lexiscan myoview. For further information please visit [email or web address]. Please follow instruction sheet, as given.  The test will be done over 2 days and take approximately 3 to 4 hours each day to complete; you may bring reading material.  If someone comes with you to your appointment, they will need to remain in the main lobby due to limited space in the testing area.   How to prepare for your Myocardial Perfusion Test: Do not eat or drink 3 hours prior to your test, except you may have water. Do not consume products containing caffeine (regular or decaffeinated) 12 hours prior to your test. (ex: coffee, chocolate, sodas, tea). Do bring a list of your current medications with you.  If not listed below, you may take your medications as normal. Do wear comfortable clothes (no dresses or overalls) and walking shoes, tennis shoes preferred (No heels or open toe shoes are allowed). Do NOT wear cologne, perfume, aftershave, or lotions (deodorant is allowed). If these instructions are not followed, your test will have to be rescheduled.  Your physician has requested that you have an echocardiogram. Echocardiography is a painless test that uses sound waves to create images of your heart. It provides your doctor with  information about the size and shape of your heart and how well your heart's chambers and valves are working. This procedure takes approximately one hour. There are no restrictions for this procedure.   Follow-Up: At [facility], you and your health needs are our priority.  As part of our continuing mission to provide you with exceptional heart care, we have created designated Provider Care Teams.  These Care Teams include your primary Cardiologist (physician) and Advanced Practice Providers (APPs -  Physician Assistants and Nurse Practitioners) who all work together to provide you with the care you need, when you need it.  We recommend signing up for the patient portal called "MyChart".  Sign up information is provided on this After Visit Summary.  MyChart is used to connect with patients for Virtual Visits (Telemedicine).  Patients are able to view lab/test results, encounter notes, upcoming appointments, etc.  Non-urgent messages can be sent to your provider as well.   To learn more about what you can do with MyChart, go to [email or web address].    Your next appointment:   6 month(s)  The format for your next appointment:   In Person  Provider:   Rajan Revankar, MD   Other Instructions Cardiac Nuclear Scan A cardiac nuclear scan is a test that is done to check the flow of blood to your heart. It is done when you are resting and when you are exercising. The test looks for problems such as: Not enough blood reaching a portion of the heart. The heart muscle not working as it should. You may need this   test if: You have heart disease. You have had lab results that are not normal. You have had heart surgery or a balloon procedure to open up blocked arteries (angioplasty). You have chest pain. You have shortness of breath. In this test, a special dye (tracer) is put into your bloodstream. The tracer will travel to your heart. A camera will then take pictures of your heart to see how  the tracer moves through your heart. This test is usually done at a hospital and takes 2-4 hours. Tell a doctor about: Any allergies you have. All medicines you are taking, including vitamins, herbs, eye drops, creams, and over-the-counter medicines. Any problems you or family members have had with anesthetic medicines. Any blood disorders you have. Any surgeries you have had. Any medical conditions you have. Whether you are pregnant or may be pregnant. What are the risks? Generally, this is a safe test. However, problems may occur, such as: Serious chest pain and heart attack. This is only a risk if the stress portion of the test is done. Rapid heartbeat. A feeling of warmth in your chest. This feeling usually does not last long. Allergic reaction to the tracer. What happens before the test? Ask your doctor about changing or stopping your normal medicines. This is important. Follow instructions from your doctor about what you cannot eat or drink. Remove your jewelry on the day of the test. What happens during the test? An IV tube will be inserted into one of your veins. Your doctor will give you a small amount of tracer through the IV tube. You will wait for 20-40 minutes while the tracer moves through your bloodstream. Your heart will be monitored with an electrocardiogram (ECG). You will lie down on an exam table. Pictures of your heart will be taken for about 15-20 minutes. You may also have a stress test. For this test, one of these things may be done: You will be asked to exercise on a treadmill or a stationary bike. You will be given medicines that will make your heart work harder. This is done if you are unable to exercise. When blood flow to your heart has peaked, a tracer will again be given through the IV tube. After 20-40 minutes, you will get back on the exam table. More pictures will be taken of your heart. Depending on the tracer that is used, more pictures may need to  be taken 3-4 hours later. Your IV tube will be removed when the test is over. The test may vary among doctors and hospitals. What happens after the test? Ask your doctor: Whether you can return to your normal schedule, including diet, activities, and medicines. Whether you should drink more fluids. This will help to remove the tracer from your body. Drink enough fluid to keep your pee (urine) pale yellow. Ask your doctor, or the department that is doing the test: When will my results be ready? How will I get my results? Summary A cardiac nuclear scan is a test that is done to check the flow of blood to your heart. Tell your doctor whether you are pregnant or may be pregnant. Before the test, ask your doctor about changing or stopping your normal medicines. This is important. Ask your doctor whether you can return to your normal activities. You may be asked to drink more fluids. This information is not intended to replace advice given to you by your health care provider. Make sure you discuss any questions you have with your health   care provider. Document Revised: 08/23/2018 Document Reviewed: 10/17/2017 Elsevier Patient Education  2021 Elsevier Inc.    Echocardiogram An echocardiogram is a test that uses sound waves (ultrasound) to produce images of the heart. Images from an echocardiogram can provide important information about: Heart size and shape. The size and thickness and movement of your heart's walls. Heart muscle function and strength. Heart valve function or if you have stenosis. Stenosis is when the heart valves are too narrow. If blood is flowing backward through the heart valves (regurgitation). A tumor or infectious growth around the heart valves. Areas of heart muscle that are not working well because of poor blood flow or injury from a heart attack. Aneurysm detection. An aneurysm is a weak or damaged part of an artery wall. The wall bulges out from the normal force of  blood pumping through the body. Tell a health care provider about: Any allergies you have. All medicines you are taking, including vitamins, herbs, eye drops, creams, and over-the-counter medicines. Any blood disorders you have. Any surgeries you have had. Any medical conditions you have. Whether you are pregnant or may be pregnant. What are the risks? Generally, this is a safe test. However, problems may occur, including an allergic reaction to dye (contrast) that may be used during the test. What happens before the test? No specific preparation is needed. You may eat and drink normally. What happens during the test? You will take off your clothes from the waist up and put on a hospital gown. Electrodes or electrocardiogram (ECG)patches may be placed on your chest. The electrodes or patches are then connected to a device that monitors your heart rate and rhythm. You will lie down on a table for an ultrasound exam. A gel will be applied to your chest to help sound waves pass through your skin. A handheld device, called a transducer, will be pressed against your chest and moved over your heart. The transducer produces sound waves that travel to your heart and bounce back (or "echo" back) to the transducer. These sound waves will be captured in real-time and changed into images of your heart that can be viewed on a video monitor. The images will be recorded on a computer and reviewed by your health care provider. You may be asked to change positions or hold your breath for a short time. This makes it easier to get different views or better views of your heart. In some cases, you may receive contrast through an IV in one of your veins. This can improve the quality of the pictures from your heart. The procedure may vary among health care providers and hospitals.    What can I expect after the test? You may return to your normal, everyday life, including diet, activities, and medicines, unless your  health care provider tells you not to do that. Follow these instructions at home: It is up to you to get the results of your test. Ask your health care provider, or the department that is doing the test, when your results will be ready. Keep all follow-up visits. This is important. Summary An echocardiogram is a test that uses sound waves (ultrasound) to produce images of the heart. Images from an echocardiogram can provide important information about the size and shape of your heart, heart muscle function, heart valve function, and other possible heart problems. You do not need to do anything to prepare before this test. You may eat and drink normally. After the echocardiogram is completed, you may   return to your normal, everyday life, unless your health care provider tells you not to do that. This information is not intended to replace advice given to you by your health care provider. Make sure you discuss any questions you have with your health care provider. Document Revised: 12/25/2019 Document Reviewed: 12/25/2019 Elsevier Patient Education  2021 Elsevier Inc.  

## 2021-06-03 ENCOUNTER — Encounter: Payer: Self-pay | Admitting: Family Medicine

## 2021-06-03 ENCOUNTER — Ambulatory Visit: Payer: Medicare PPO | Admitting: Family Medicine

## 2021-06-03 VITALS — BP 148/90 | HR 57 | Temp 97.6°F | Wt 254.4 lb

## 2021-06-03 DIAGNOSIS — Z01818 Encounter for other preprocedural examination: Secondary | ICD-10-CM

## 2021-06-03 DIAGNOSIS — J4530 Mild persistent asthma, uncomplicated: Secondary | ICD-10-CM

## 2021-06-03 DIAGNOSIS — E1169 Type 2 diabetes mellitus with other specified complication: Secondary | ICD-10-CM

## 2021-06-03 DIAGNOSIS — R609 Edema, unspecified: Secondary | ICD-10-CM

## 2021-06-03 DIAGNOSIS — R600 Localized edema: Secondary | ICD-10-CM | POA: Diagnosis not present

## 2021-06-03 DIAGNOSIS — I10 Essential (primary) hypertension: Secondary | ICD-10-CM | POA: Diagnosis not present

## 2021-06-03 DIAGNOSIS — M1611 Unilateral primary osteoarthritis, right hip: Secondary | ICD-10-CM | POA: Diagnosis not present

## 2021-06-03 NOTE — Progress Notes (Signed)
° °  Subjective:    Patient ID: Evert Kohl, female    DOB: [birth year], 76 y.o.   MRN: [id number]  HPI She is here for preoperative evaluation.  She was seen yesterday by cardiology to get a clearance prior to total hip replacement.  This is causing her considerable amount of discomfort.  She does have underlying diabetes with hemoglobin A1c of 6.7 in November.  She seems to be doing fairly good job of taking care of her self in that regard.  She continues on medication as listed in the chart.  She is taking Flovent and occasionally does use albuterol.  At this point she seems be doing well with that.   Review of Systems     Objective:   Physical Exam Alert and in no distress.  Cardiac exam shows regular rhythm without murmurs or gallops.  Lungs are clear to auscultation.  Lower extremities do show some slight edema.       Assessment & Plan:  Preoperative evaluation to rule out surgical contraindication  Primary osteoarthritis of right hip  Edema of both lower extremities  Senile cataract of left eye, unspecified age-related cataract type  Type 2 diabetes mellitus with other specified complication, without long-term current use of insulin (HCC)  Mild persistent asthma, unspecified whether complicated  Primary hypertension  Dependent edema She is cleared for surgery.  Did recommend she keep her feet elevated as much as possible and also use support stockings.  She will continue her present medication regimen.

## 2021-06-11 ENCOUNTER — Ambulatory Visit (INDEPENDENT_AMBULATORY_CARE_PROVIDER_SITE_OTHER): Payer: Medicare PPO | Admitting: Family Medicine

## 2021-06-11 ENCOUNTER — Other Ambulatory Visit: Payer: Self-pay

## 2021-06-11 ENCOUNTER — Encounter (INDEPENDENT_AMBULATORY_CARE_PROVIDER_SITE_OTHER): Payer: Self-pay | Admitting: Family Medicine

## 2021-06-11 VITALS — BP 163/80 | HR 60 | Temp 98.1°F | Ht 67.0 in | Wt 247.0 lb

## 2021-06-11 DIAGNOSIS — M1611 Unilateral primary osteoarthritis, right hip: Secondary | ICD-10-CM

## 2021-06-11 DIAGNOSIS — E782 Mixed hyperlipidemia: Secondary | ICD-10-CM | POA: Diagnosis not present

## 2021-06-11 DIAGNOSIS — Z6838 Body mass index (BMI) 38.0-38.9, adult: Secondary | ICD-10-CM | POA: Diagnosis not present

## 2021-06-11 DIAGNOSIS — E669 Obesity, unspecified: Secondary | ICD-10-CM

## 2021-06-11 DIAGNOSIS — Z6841 Body Mass Index (BMI) 40.0 and over, adult: Secondary | ICD-10-CM

## 2021-06-11 DIAGNOSIS — E119 Type 2 diabetes mellitus without complications: Secondary | ICD-10-CM

## 2021-06-11 DIAGNOSIS — E1169 Type 2 diabetes mellitus with other specified complication: Secondary | ICD-10-CM

## 2021-06-11 DIAGNOSIS — E559 Vitamin D deficiency, unspecified: Secondary | ICD-10-CM

## 2021-06-11 MED ORDER — VITAMIN D (ERGOCALCIFEROL) 1.25 MG (50000 UNIT) PO CAPS: 50000.0000 [IU] | ORAL_CAPSULE | ORAL | 0 refills | Status: DC

## 2021-06-15 NOTE — Progress Notes (Signed)
Chief Complaint:   OBESITY Sandra Lawrence is here to discuss her progress with her obesity treatment plan along with follow-up of her obesity related diagnoses. Sandra Lawrence is on the Category 2 Plan and states she is following her eating plan approximately 70% of the time. Sandra Lawrence states she is not currently exercising.  Today's visit was #: 11 Starting weight: 286 lbs Starting date: 12/18/2020 Today's weight: 247 lbs Today's date: 06/11/2021 Total lbs lost to date: 39 Total lbs lost since last in-office visit: 2  Interim History: Sandra Lawrence is here for a follow up office visit.  We reviewed her meal plan and questions were answered.  Patient's food recall appears to be accurate and consistent with what is on plan when she is following it.   When eating on plan, her hunger and cravings are well controlled.    Subjective:   1. Type 2 diabetes mellitus with other specified complication, without long-term current use of insulin (HCC) FBS run 90-103. Pt denies hunger or cravings. Her A1c 2 months ago was 6.7.  2. Osteoarthritis of right hip, unspecified osteoarthritis type Pt is awaiting cardiology clearance for hip replacement. She is having testing next week on her heart.  3. Vitamin D deficiency Pt's Vit D level was 66 about 2 months ago.  4. Mixed diabetic hyperlipidemia associated with type 2 diabetes mellitus ([place]) Pt has low HDL and LDL at goal 2 months ago. She can't cut Crestor in 1/2, so she has been taking 1 tab every other night. Pt has a lot of pills.  Assessment/Plan:  No orders of the defined types were placed in this encounter.   Medications Discontinued During This Encounter  Medication Reason   Vitamin D, Ergocalciferol, (DRISDOL) 1.25 MG (50000 UNIT) CAPS capsule Reorder     Meds ordered this encounter  Medications   Vitamin D, Ergocalciferol, (DRISDOL) 1.25 MG (50000 UNIT) CAPS capsule    Sig: Take 1 capsule (50,000 Units total) by mouth every 7 (seven) days.     Dispense:  4 capsule    Refill:  0    Ov for RF; 30 d supply     1. Type 2 diabetes mellitus with other specified complication, without long-term current use of insulin (HCC) No meds. Continue prudent nutritional plan and weight loss. Recheck labs every 3 months.  2. Osteoarthritis of right hip, unspecified osteoarthritis type Pt is looking forward to increasing walking after getting her new hip.  3. Vitamin D deficiency Low Vitamin D level contributes to fatigue and are associated with obesity, breast, and colon cancer. She agrees to continue to take prescription Vitamin D 50,000 IU every week and will follow-up for routine testing of Vitamin D, at least 2-3 times per year to avoid over-replacement. Pt needs to schedule bone density exam.  Refill- Vitamin D, Ergocalciferol, (DRISDOL) 1.25 MG (50000 UNIT) CAPS capsule; Take 1 capsule (50,000 Units total) by mouth every 7 (seven) days.  Dispense: 4 capsule; Refill: 0  4. Mixed diabetic hyperlipidemia associated with type 2 diabetes mellitus ([place]) Cardiovascular risk and specific lipid/LDL goals reviewed.  We discussed several lifestyle modifications today and Sandra Lawrence will continue to work on diet, exercise and weight loss efforts. Orders and follow up as documented in patient record. Continue taking Crestor every other night.  Counseling Intensive lifestyle modifications are the first line treatment for this issue. Dietary changes: Increase soluble fiber. Decrease simple carbohydrates. Exercise changes: Moderate to vigorous-intensity aerobic activity 150 minutes per week if tolerated. Lipid-lowering  medications: see documented in medical record.  5. Obesity with current BMI of 38.7 Sandra Lawrence is currently in the action stage of change. As such, her goal is to continue with weight loss efforts. She has agreed to the Category 2 Plan.   Exercise goals: Older adults should follow the adult guidelines. When older adults cannot meet the adult  guidelines, they should be as physically active as their abilities and conditions will allow.   Behavioral modification strategies: planning for success.  Sandra Lawrence has agreed to follow-up with our clinic in 3 weeks. She was informed of the importance of frequent follow-up visits to maximize her success with intensive lifestyle modifications for her multiple health conditions.   Objective:   Blood pressure (!) 163/80, pulse 60, temperature 98.1 F (36.7 C), height 5\' 7"  (1.702 m), weight 247 lb (112 kg), SpO2 100 %. Body mass index is 38.69 kg/m.  General: Cooperative, alert, well developed, in no acute distress. HEENT: Conjunctivae and lids unremarkable. Cardiovascular: Regular rhythm.  Lungs: Normal work of breathing. Neurologic: No focal deficits.   Lab Results  Component Value Date   CREATININE 0.68 04/02/2021   BUN 13 04/02/2021   NA 143 04/02/2021   K 4.5 04/02/2021   CL 104 04/02/2021   CO2 27 04/02/2021   Lab Results  Component Value Date   ALT 28 04/02/2021   AST 20 04/02/2021   ALKPHOS 133 (H) 04/02/2021   BILITOT 0.5 04/02/2021   Lab Results  Component Value Date   HGBA1C 6.7 (H) 04/02/2021   HGBA1C 7.6 (H) 12/18/2020   HGBA1C 7.5 (A) 09/26/2020   HGBA1C 7.9 (A) 08/20/2019   HGBA1C 8.6 (A) 02/21/2019   Lab Results  Component Value Date   INSULIN 7.6 04/02/2021   INSULIN 12.1 12/18/2020   Lab Results  Component Value Date   TSH 1.650 12/18/2020   Lab Results  Component Value Date   CHOL 97 (L) 04/02/2021   HDL 37 (L) 04/02/2021   LDLCALC 48 04/02/2021   TRIG 50 04/02/2021   CHOLHDL 3.8 02/21/2019   Lab Results  Component Value Date   VD25OH 66.4 04/02/2021   VD25OH 23.0 (L) 12/18/2020   Lab Results  Component Value Date   WBC 6.0 01/18/2021   HGB 13.2 01/18/2021   HCT 41.4 01/18/2021   MCV 90.0 01/18/2021   PLT 260 01/18/2021   No results found for: IRON, TIBC, FERRITIN  Obesity Behavioral Intervention:   Approximately 15 minutes were  spent on the discussion below.  ASK: We discussed the diagnosis of obesity with Sandra Lawrence today and Sandra Lawrence agreed to give [place] permission to discuss obesity behavioral modification therapy today.  ASSESS: Sandra Lawrence has the diagnosis of obesity and her BMI today is 38.7. Sandra Lawrence is in the action stage of change.   ADVISE: Sandra Lawrence was educated on the multiple health risks of obesity as well as the benefit of weight loss to improve her health. She was advised of the need for long term treatment and the importance of lifestyle modifications to improve her current health and to decrease her risk of future health problems.  AGREE: Multiple dietary modification options and treatment options were discussed and Sandra Lawrence agreed to follow the recommendations documented in the above note.  ARRANGE: Sandra Lawrence was educated on the importance of frequent visits to treat obesity as outlined per CMS and USPSTF guidelines and agreed to schedule her next follow up appointment today.  Attestation Statements:   Reviewed by clinician on day of visit: allergies, medications, problem  list, medical history, surgical history, family history, social history, and previous encounter notes.  Coral Ceo, CMA, am acting as transcriptionist for [employer], DO.  I have reviewed the above documentation for accuracy and completeness, and I agree with the above. Sandra Lawrence, D.O.  The [place] was signed into law in 2016 which includes the topic of electronic health records.  This provides immediate access to information in MyChart.  This includes consultation notes, operative notes, office notes, lab results and pathology reports.  If you have any questions about what you read please let us know at your next visit so we can discuss your concerns and take corrective action if need be.  We are right here with you.

## 2021-06-16 ENCOUNTER — Ambulatory Visit (HOSPITAL_COMMUNITY): Payer: Medicare PPO | Attending: Cardiology

## 2021-06-16 ENCOUNTER — Other Ambulatory Visit: Payer: Self-pay

## 2021-06-16 DIAGNOSIS — Z01810 Encounter for preprocedural cardiovascular examination: Secondary | ICD-10-CM | POA: Insufficient documentation

## 2021-06-16 DIAGNOSIS — E782 Mixed hyperlipidemia: Secondary | ICD-10-CM | POA: Diagnosis not present

## 2021-06-16 DIAGNOSIS — I10 Essential (primary) hypertension: Secondary | ICD-10-CM | POA: Diagnosis not present

## 2021-06-16 DIAGNOSIS — E1169 Type 2 diabetes mellitus with other specified complication: Secondary | ICD-10-CM | POA: Insufficient documentation

## 2021-06-16 DIAGNOSIS — I2510 Atherosclerotic heart disease of native coronary artery without angina pectoris: Secondary | ICD-10-CM | POA: Insufficient documentation

## 2021-06-16 MED ORDER — REGADENOSON 0.4 MG/5ML IV SOLN
0.4000 mg | Freq: Once | INTRAVENOUS | Status: AC
  Administered 2021-06-16: 0.4 mg via INTRAVENOUS

## 2021-06-16 MED ORDER — TECHNETIUM TC 99M TETROFOSMIN IV KIT
32.3000 | PACK | Freq: Once | INTRAVENOUS | Status: AC | PRN
  Administered 2021-06-16: 32.3 via INTRAVENOUS
  Filled 2021-06-16: qty 33

## 2021-06-17 ENCOUNTER — Ambulatory Visit (HOSPITAL_COMMUNITY): Payer: Medicare PPO

## 2021-06-17 ENCOUNTER — Ambulatory Visit (HOSPITAL_BASED_OUTPATIENT_CLINIC_OR_DEPARTMENT_OTHER): Payer: Medicare PPO

## 2021-06-17 DIAGNOSIS — I10 Essential (primary) hypertension: Secondary | ICD-10-CM | POA: Diagnosis not present

## 2021-06-17 DIAGNOSIS — E1169 Type 2 diabetes mellitus with other specified complication: Secondary | ICD-10-CM | POA: Diagnosis not present

## 2021-06-17 DIAGNOSIS — E782 Mixed hyperlipidemia: Secondary | ICD-10-CM | POA: Diagnosis not present

## 2021-06-17 DIAGNOSIS — I2510 Atherosclerotic heart disease of native coronary artery without angina pectoris: Secondary | ICD-10-CM

## 2021-06-17 DIAGNOSIS — Z01810 Encounter for preprocedural cardiovascular examination: Secondary | ICD-10-CM | POA: Diagnosis not present

## 2021-06-17 LAB — ECHOCARDIOGRAM COMPLETE
Area-P 1/2: 3.56 cm2
S' Lateral: 2.8 cm

## 2021-06-17 LAB — MYOCARDIAL PERFUSION IMAGING
LV dias vol: 90 mL (ref 46–106)
LV sys vol: 32 mL
Nuc Stress EF: 64 %
Peak HR: 89 {beats}/min
Rest HR: 60 {beats}/min
Rest Nuclear Isotope Dose: 31.7 mCi
SDS: 1
SRS: 0
SSS: 1
ST Depression (mm): 0 mm
Stress Nuclear Isotope Dose: 32.3 mCi
TID: 0.99

## 2021-06-17 MED ORDER — TECHNETIUM TC 99M TETROFOSMIN IV KIT
31.7000 | PACK | Freq: Once | INTRAVENOUS | Status: AC | PRN
  Administered 2021-06-17: 31.7 via INTRAVENOUS
  Filled 2021-06-17: qty 32

## 2021-07-02 ENCOUNTER — Ambulatory Visit: Payer: Self-pay | Admitting: Physician Assistant

## 2021-07-02 ENCOUNTER — Ambulatory Visit (INDEPENDENT_AMBULATORY_CARE_PROVIDER_SITE_OTHER): Payer: Medicare PPO | Admitting: Adult Health

## 2021-07-02 DIAGNOSIS — M1611 Unilateral primary osteoarthritis, right hip: Secondary | ICD-10-CM | POA: Diagnosis not present

## 2021-07-02 DIAGNOSIS — G8929 Other chronic pain: Secondary | ICD-10-CM

## 2021-07-02 DIAGNOSIS — M25551 Pain in right hip: Secondary | ICD-10-CM
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
## 2021-07-06 ENCOUNTER — Other Ambulatory Visit: Payer: Self-pay

## 2021-07-06 NOTE — Patient Outreach (Signed)
[facility]) Care Management  07/06/2021  Sandra Lawrence [birth year] [id number]   Telephone call to patient for follow up disease management support. Unable to reach patient or leave voice message.   Plan: RN CM will attempt again in the month of May.  [name], RN, MSN [facility] Management Care Management Coordinator Direct Line [phone] Cell [phone] Toll Free: [phone]  Fax: [phone]

## 2021-07-07 ENCOUNTER — Other Ambulatory Visit (INDEPENDENT_AMBULATORY_CARE_PROVIDER_SITE_OTHER): Payer: Self-pay | Admitting: Physician Assistant

## 2021-07-07 DIAGNOSIS — E559 Vitamin D deficiency, unspecified: Secondary | ICD-10-CM

## 2021-07-07 NOTE — Telephone Encounter (Signed)
Pt last seen by Katy Danford, FNP.  

## 2021-07-07 NOTE — Telephone Encounter (Signed)
** PT CANCELLED LAST 2 OV **   LAST APPOINTMENT DATE: 06/11/21 NEXT APPOINTMENT DATE: 07/17/21   [id number] [place] [place] [address] [place] [address] Phone: [phone] Fax: [phone]  [facility] ([place], [place] [facility] [place] [address] Phone: [phone] Fax: [place], [place] [address] [place] [address] Phone: [phone] Fax: [phone]  [place] [address] Phone: [phone] Fax: [place] [address] [place] [address] Phone: [phone] Fax: [phone]  Patient is requesting a refill of the following medications: Pending Prescriptions:                       Disp   Refills   Vitamin D, Ergocalciferol, (DRISDOL) 1.25 *12 cap*1       Sig: Take 1 capsule (50,000 Units total) by mouth every 7          (seven) days.   Date last filled: 06/11/21 Previously prescribed by Dr.Opalski  Lab Results      Component                Value               Date                      HGBA1C                   6.7 (H)             04/02/2021                HGBA1C                   7.6 (H)             12/18/2020                HGBA1C                   7.5 (A)             09/26/2020           Lab Results      Component                Value               Date                      MICROALBUR               24.1                08/20/2019                LDLCALC                  48                  04/02/2021                CREATININE               0.68  04/02/2021           Lab Results      Component                Value               Date                      VD25OH                   66.4                04/02/2021                 VD25OH                   23.0 (L)            12/18/2020            BP Readings from Last 3 Encounters: 06/11/21 : (!) 163/80 06/03/21 : (!) 148/90 06/02/21 : (!) 150/62

## 2021-07-14 NOTE — Care Plan (Signed)
Ortho Bundle Case Management Note  Patient Details  Name: Sandra Lawrence MRN: [id number] Date of Birth: [birth year]     Spoke with patient prior to surgery. She plans to discharge to home with family to assist. [facility] has requested and order sent to [place] chair, bed rail, reacher, 3n1 and rolling walker. They were to contact her with delivery information  HHPT referral to [facility] and [facility] set up with [facility].  Patient and MD in agreement with plan. Choice offered                 DME Arranged:  Walker rolling, Bedside commode DME Agency:  [facility]  HH Arranged:  PT HH Agency:  [facility]  Additional Comments: Please contact me with any questions of if this plan should need to change.  Ladell Heads,  [facility]ist  [phone] 07/14/2021, 11:34 AM

## 2021-07-15 NOTE — Progress Notes (Signed)
DUE TO COVID-19 ONLY ONE VISITOR IS ALLOWED TO COME WITH YOU AND STAY IN THE WAITING ROOM ONLY DURING PRE OP AND PROCEDURE DAY OF SURGERY.  2 VISITOR  MAY VISIT WITH YOU AFTER SURGERY IN YOUR PRIVATE ROOM DURING VISITING HOURS ONLY! ?YOU MAY HAVE ONE PERSON SPEND THE NITE WITH YOU IN YOUR ROOM AFTER SURGERY.   ? ?YOU NEED TO HAVE A COVID 19 TEST ON        07/21/2021                          COME THRU MAIN ENTRANCE AT [facility] HAVE A SEAT IN THE LOBBY ON THE RIGHT AS YOU COME THRU THE DOOR.  CALL [phone] AND GIVE THEM YOUR NAME AND LET THEM KNOW YOU ARE HERE FOR COVID TESTING.  ? ? Your procedure is scheduled on:  ?          07/24/2021.  ? Report to [facility] Main  Entrance ? ? Report to admitting at      0515            AM ?DO NOT [place], PICTURE ID OR WALLET DAY OF SURGERY.  ?  ? ? Call this number if you have problems the morning of surgery [phone]  ? ? REMEMBER: NO  SOLID FOODS , CANDY, GUM OR MINTS AFTER MIDNITE THE NITE BEFORE SURGERY .       [place] [facility] CLEAR LIQUIDS UNTIL      0430am           DAY OF SURGERY.      PLEASE FINISH ENSURE DRINK PER SURGEON ORDER  WHICH NEEDS TO BE COMPLETED AT     0430am     MORNING OF SURGERY.   ? ? ? ? ?CLEAR LIQUID DIET ? ? ?Foods Allowed      ?WATER ?BLACK COFFEE ( SUGAR OK, NO MILK, CREAM OR CREAMER) REGULAR AND DECAF  ?TEA ( SUGAR OK NO MILK, CREAM, OR CREAMER) REGULAR AND DECAF  ?PLAIN JELLO ( NO RED)  ?FRUIT ICES ( NO RED, NO FRUIT PULP)  ?POPSICLES ( NO RED)  ?JUICE- APPLE, WHITE GRAPE AND WHITE CRANBERRY  ?SPORT DRINK LIKE GATORADE ( NO RED)  ?CLEAR BROTH ( VEGETABLE , CHICKEN OR BEEF)                                                               ? ?    ? ?BRUSH YOUR TEETH MORNING OF SURGERY AND RINSE YOUR MOUTH OUT, NO CHEWING GUM CANDY OR MINTS. ?  ? ? Take these medicines the morning of surgery with A SIP OF WATER:  inhalers as usual and bring, metoprolol  ? ? ?DO NOT TAKE ANY DIABETIC MEDICATIONS DAY OF YOUR SURGERY ?                  ?             You may not have any metal on your body including hair pins and  ?            piercings  Do not wear jewelry, make-up, lotions, powders or perfumes, deodorant ?  Do not wear nail polish on your fingernails.   ?           IF YOU ARE A FEMALE AND WANT TO SHAVE UNDER ARMS OR LEGS PRIOR TO SURGERY YOU MUST DO SO AT LEAST 48 HOURS PRIOR TO SURGERY.  ?            Men may shave face and neck. ? ? Do not bring valuables to the hospital. [facility] NOT ?            RESPONSIBLE   FOR VALUABLES. ? Contacts, dentures or bridgework may not be worn into surgery. ? Leave suitcase in the car. After surgery it may be brought to your room. ? ?  ? Patients discharged the day of surgery will not be allowed to drive home. IF YOU ARE HAVING SURGERY AND GOING HOME THE SAME DAY, YOU MUST HAVE AN ADULT TO DRIVE YOU HOME AND BE WITH YOU FOR 24 HOURS. YOU MAY GO HOME BY TAXI OR UBER OR ORTHERWISE, BUT AN ADULT MUST ACCOMPANY YOU HOME AND STAY WITH YOU FOR 24 HOURS. ?  ? ?            Please read over the following fact sheets you were given: ?_____________________________________________________________________ ? ?[facility] - Preparing for Surgery ?Before surgery, you can play an important role.  Because skin is not sterile, your skin needs to be as free of germs as possible.  You can reduce the number of germs on your skin by washing with CHG (chlorahexidine gluconate) soap before surgery.  CHG is an antiseptic cleaner which kills germs and bonds with the skin to continue killing germs even after washing. ?Please DO NOT use if you have an allergy to CHG or antibacterial soaps.  If your skin becomes reddened/irritated stop using the CHG and inform your nurse when you arrive at Short Stay. ?Do not shave (including legs and underarms) for at least 48 hours prior to the first CHG shower.  You may shave your face/neck. ?Please follow these instructions carefully: ? 1.  Shower with CHG Soap the night before surgery and the   morning of Surgery. ? 2.  If you choose to wash your hair, wash your hair first as usual with your  normal  shampoo. ? 3.  After you shampoo, rinse your hair and body thoroughly to remove the  shampoo.                           4.  Use CHG as you would any other liquid soap.  You can apply chg directly  to the skin and wash  ?                     Gently with a scrungie or clean washcloth. ? 5.  Apply the CHG Soap to your body ONLY FROM THE NECK DOWN.   Do not use on face/ open      ?                     Wound or open sores. Avoid contact with eyes, ears mouth and genitals (private parts).  ?                     Production manager,  Genitals (private parts) with your normal soap. ?            6.  Wash thoroughly, paying  special attention to the area where your surgery  will be performed. ? 7.  Thoroughly rinse your body with warm water from the neck down. ? 8.  DO NOT shower/wash with your normal soap after using and rinsing off  the CHG Soap. ?               9.  Pat yourself dry with a clean towel. ?           10.  Wear clean pajamas. ?           11.  Place clean sheets on your bed the night of your first shower and do not  sleep with pets. ?Day of Surgery : ?Do not apply any lotions/deodorants the morning of surgery.  Please wear clean clothes to the hospital/surgery center. ? ?FAILURE TO FOLLOW THESE INSTRUCTIONS MAY RESULT IN THE CANCELLATION OF YOUR SURGERY ?PATIENT SIGNATURE_________________________________ ? ?NURSE SIGNATURE__________________________________ ? ?________________________________________________________________________  ? ? ?           ?

## 2021-07-15 NOTE — Progress Notes (Signed)
Anesthesia Review: ? ?PCP: DR Jill Alexanders LOV 06/03/2021 clearance in note and on chart dated 06/03/21.  ?Cardiologist : DR Geraldo Pitter  LOV 06/02/2021  ?Chest x-ray : 01/18/21  ?EKG : 06/02/21  ?Echo :06/17/21  ?Stress test:06/17/21  ?Cardiac Cath :  ?Activity level:  ?Sleep Study/ CPAP : ?Fasting Blood Sugar :      / Checks Blood Sugar -- times a day:   ?Blood Thinner/ Instructions /Last Dose: ?ASA / Instructions/ Last Dose :   ?

## 2021-07-17 ENCOUNTER — Encounter (HOSPITAL_COMMUNITY)
Admission: RE | Admit: 2021-07-17 | Discharge: 2021-07-17 | Disposition: A | Discharge status: Home / Self Care | Payer: Medicare PPO | Source: Ambulatory Visit | Attending: Orthopedic Surgery | Admitting: Orthopedic Surgery

## 2021-07-17 ENCOUNTER — Other Ambulatory Visit: Payer: Self-pay

## 2021-07-17 ENCOUNTER — Encounter (HOSPITAL_COMMUNITY): Payer: Self-pay

## 2021-07-17 VITALS — BP 190/75 | HR 62 | Temp 98.2°F | Resp 18 | Ht 67.0 in | Wt 225.0 lb

## 2021-07-17 DIAGNOSIS — M25551 Pain in right hip: Secondary | ICD-10-CM | POA: Diagnosis not present

## 2021-07-17 DIAGNOSIS — Z01818 Encounter for other preprocedural examination: Secondary | ICD-10-CM

## 2021-07-17 DIAGNOSIS — G8929 Other chronic pain: Secondary | ICD-10-CM | POA: Diagnosis not present

## 2021-07-17 DIAGNOSIS — M1611 Unilateral primary osteoarthritis, right hip: Secondary | ICD-10-CM

## 2021-07-17 DIAGNOSIS — Z01812 Encounter for preprocedural laboratory examination: Secondary | ICD-10-CM | POA: Insufficient documentation

## 2021-07-17 HISTORY — DX: Pneumonia, unspecified organism: J18.9

## 2021-07-17 HISTORY — DX: Personal history of urinary calculi: Z87.442

## 2021-07-17 LAB — CBC WITH DIFFERENTIAL/PLATELET
Abs Immature Granulocytes: 0.01 10*3/uL (ref 0.00–0.07)
Basophils Absolute: 0.1 10*3/uL (ref 0.0–0.1)
Basophils Relative: 1 %
Eosinophils Absolute: 0.5 10*3/uL (ref 0.0–0.5)
Eosinophils Relative: 9 %
HCT: 40.7 % (ref 36.0–46.0)
Hemoglobin: 12.9 g/dL (ref 12.0–15.0)
Immature Granulocytes: 0 %
Lymphocytes Relative: 37 %
Lymphs Abs: 2 10*3/uL (ref 0.7–4.0)
MCH: 28.2 pg (ref 26.0–34.0)
MCHC: 31.7 g/dL (ref 30.0–36.0)
MCV: 89.1 fL (ref 80.0–100.0)
Monocytes Absolute: 0.5 10*3/uL (ref 0.1–1.0)
Monocytes Relative: 10 %
Neutro Abs: 2.3 10*3/uL (ref 1.7–7.7)
Neutrophils Relative %: 43 %
Platelets: 205 10*3/uL (ref 150–400)
RBC: 4.57 MIL/uL (ref 3.87–5.11)
RDW: 15.2 % (ref 11.5–15.5)
WBC: 5.3 10*3/uL (ref 4.0–10.5)
nRBC: 0 % (ref 0.0–0.2)

## 2021-07-17 LAB — COMPREHENSIVE METABOLIC PANEL
ALT: 29 U/L (ref 0–44)
AST: 22 U/L (ref 15–41)
Albumin: 4 g/dL (ref 3.5–5.0)
Alkaline Phosphatase: 116 U/L (ref 38–126)
Anion gap: 8 (ref 5–15)
BUN: 20 mg/dL (ref 8–23)
CO2: 26 mmol/L (ref 22–32)
Calcium: 9.5 mg/dL (ref 8.9–10.3)
Chloride: 102 mmol/L (ref 98–111)
Creatinine, Ser: 0.49 mg/dL (ref 0.44–1.00)
GFR, Estimated: >60 mL/min (ref >60)
Glucose, Bld: 92 mg/dL (ref 70–99)
Potassium: 3.7 mmol/L (ref 3.5–5.1)
Sodium: 136 mmol/L (ref 135–145)
Total Bilirubin: 0.5 mg/dL (ref 0.3–1.2)
Total Protein: 7.6 g/dL (ref 6.5–8.1)

## 2021-07-17 LAB — HEMOGLOBIN A1C
Hgb A1c MFr Bld: 6.1 % — ABNORMAL HIGH (ref 4.8–5.6)
Mean Plasma Glucose: 128.37 mg/dL

## 2021-07-17 LAB — GLUCOSE, CAPILLARY: Glucose-Capillary: 103 mg/dL — ABNORMAL HIGH (ref 70–99)

## 2021-07-17 NOTE — Progress Notes (Addendum)
Anesthesia Review:DM ? ?PCP: DR Jill Alexanders LOV 06/03/2021 clearance in note and on chart dated 06/03/21.  ?Cardiologist : DR Geraldo Pitter  LOV 06/02/2021  ? ?Chest x-ray : 01/18/21  ?EKG : 06/02/21  ?Echo :06/17/21  ?Stress test:06/17/21  ?Cardiac Cath :  ? ? ?Activity level: Able to do ADLs without SOB with Rollator ? ?Sleep Study/ CPAP : ? ?Fasting Blood Sugar :  98-107    / Checks Blood Sugar -2- times a  month   ? ?Blood Thinner/ Instructions /Last Dose: ?ASA / Instructions/ Last Dose :   ? ?Covid vaccine x 4  pfizer ?Covid + test last 90 days - NO ?

## 2021-07-20 NOTE — Progress Notes (Signed)
Hgba1c-6.1 on 07/17/21.  ?

## 2021-07-22 ENCOUNTER — Other Ambulatory Visit: Payer: Self-pay

## 2021-07-22 ENCOUNTER — Encounter (HOSPITAL_COMMUNITY)
Admission: RE | Admit: 2021-07-22 | Discharge: 2021-07-22 | Disposition: A | Discharge status: Home / Self Care | Payer: Medicare PPO | Source: Ambulatory Visit | Attending: Orthopedic Surgery | Admitting: Orthopedic Surgery

## 2021-07-22 DIAGNOSIS — Z01812 Encounter for preprocedural laboratory examination: Secondary | ICD-10-CM | POA: Insufficient documentation

## 2021-07-22 DIAGNOSIS — Z20822 Contact with and (suspected) exposure to covid-19: Secondary | ICD-10-CM | POA: Insufficient documentation

## 2021-07-22 DIAGNOSIS — Z01818 Encounter for other preprocedural examination: Secondary | ICD-10-CM

## 2021-07-22 LAB — SARS CORONAVIRUS 2 (TAT 6-24 HRS): SARS Coronavirus 2: NEGATIVE

## 2021-07-23 ENCOUNTER — Ambulatory Visit (INDEPENDENT_AMBULATORY_CARE_PROVIDER_SITE_OTHER): Payer: Medicare PPO | Admitting: Family Medicine

## 2021-07-24 ENCOUNTER — Encounter (HOSPITAL_COMMUNITY): Payer: Self-pay | Admitting: Orthopedic Surgery

## 2021-07-24 ENCOUNTER — Ambulatory Visit (HOSPITAL_BASED_OUTPATIENT_CLINIC_OR_DEPARTMENT_OTHER): Payer: Medicare PPO | Admitting: Certified Registered Nurse Anesthetist

## 2021-07-24 ENCOUNTER — Ambulatory Visit (HOSPITAL_COMMUNITY): Payer: Medicare PPO

## 2021-07-24 ENCOUNTER — Observation Stay (HOSPITAL_COMMUNITY)
Admission: RE | Admit: 2021-07-24 | Discharge: 2021-07-27 | Disposition: A | Discharge status: Home Health | Payer: Medicare PPO | Attending: Orthopedic Surgery | Admitting: Orthopedic Surgery

## 2021-07-24 ENCOUNTER — Encounter (HOSPITAL_COMMUNITY)
Admission: RE | Disposition: A | Discharge status: Home Health | Payer: Self-pay | Source: Home / Self Care | Attending: Orthopedic Surgery

## 2021-07-24 ENCOUNTER — Other Ambulatory Visit: Payer: Self-pay

## 2021-07-24 ENCOUNTER — Ambulatory Visit (HOSPITAL_COMMUNITY): Payer: Medicare PPO | Admitting: Certified Registered Nurse Anesthetist

## 2021-07-24 DIAGNOSIS — Z87891 Personal history of nicotine dependence: Secondary | ICD-10-CM | POA: Insufficient documentation

## 2021-07-24 DIAGNOSIS — I129 Hypertensive chronic kidney disease with stage 1 through stage 4 chronic kidney disease, or unspecified chronic kidney disease: Secondary | ICD-10-CM

## 2021-07-24 DIAGNOSIS — E1122 Type 2 diabetes mellitus with diabetic chronic kidney disease: Secondary | ICD-10-CM | POA: Diagnosis not present

## 2021-07-24 DIAGNOSIS — Z471 Aftercare following joint replacement surgery: Secondary | ICD-10-CM | POA: Diagnosis not present

## 2021-07-24 DIAGNOSIS — M1611 Unilateral primary osteoarthritis, right hip: Principal | ICD-10-CM | POA: Insufficient documentation

## 2021-07-24 DIAGNOSIS — Z96651 Presence of right artificial knee joint: Secondary | ICD-10-CM | POA: Insufficient documentation

## 2021-07-24 DIAGNOSIS — I130 Hypertensive heart and chronic kidney disease with heart failure and stage 1 through stage 4 chronic kidney disease, or unspecified chronic kidney disease: Secondary | ICD-10-CM | POA: Insufficient documentation

## 2021-07-24 DIAGNOSIS — I2510 Atherosclerotic heart disease of native coronary artery without angina pectoris: Secondary | ICD-10-CM | POA: Insufficient documentation

## 2021-07-24 DIAGNOSIS — Z96641 Presence of right artificial hip joint: Secondary | ICD-10-CM | POA: Diagnosis not present

## 2021-07-24 DIAGNOSIS — I5040 Unspecified combined systolic (congestive) and diastolic (congestive) heart failure: Secondary | ICD-10-CM | POA: Insufficient documentation

## 2021-07-24 DIAGNOSIS — Z01818 Encounter for other preprocedural examination: Secondary | ICD-10-CM

## 2021-07-24 DIAGNOSIS — J45909 Unspecified asthma, uncomplicated: Secondary | ICD-10-CM | POA: Diagnosis not present

## 2021-07-24 DIAGNOSIS — Z79899 Other long term (current) drug therapy: Secondary | ICD-10-CM | POA: Insufficient documentation

## 2021-07-24 DIAGNOSIS — Z7984 Long term (current) use of oral hypoglycemic drugs: Secondary | ICD-10-CM | POA: Insufficient documentation

## 2021-07-24 DIAGNOSIS — G4733 Obstructive sleep apnea (adult) (pediatric): Secondary | ICD-10-CM | POA: Insufficient documentation

## 2021-07-24 DIAGNOSIS — N189 Chronic kidney disease, unspecified: Secondary | ICD-10-CM | POA: Diagnosis not present

## 2021-07-24 DIAGNOSIS — E119 Type 2 diabetes mellitus without complications: Secondary | ICD-10-CM | POA: Insufficient documentation

## 2021-07-24 HISTORY — PX: TOTAL HIP ARTHROPLASTY: SHX124

## 2021-07-24 LAB — GLUCOSE, CAPILLARY
Glucose-Capillary: 123 mg/dL — ABNORMAL HIGH (ref 70–99)
Glucose-Capillary: 194 mg/dL — ABNORMAL HIGH (ref 70–99)
Glucose-Capillary: 189 mg/dL — ABNORMAL HIGH (ref 70–99)
Glucose-Capillary: 199 mg/dL — ABNORMAL HIGH (ref 70–99)
Glucose-Capillary: 203 mg/dL — ABNORMAL HIGH (ref 70–99)

## 2021-07-24 SURGERY — ARTHROPLASTY, HIP, TOTAL,POSTERIOR APPROACH
Anesthesia: General | Site: Hip | Laterality: Right

## 2021-07-24 MED ORDER — ACETAMINOPHEN 500 MG PO TABS
1000.0000 mg | ORAL_TABLET | Freq: Four times a day (QID) | ORAL | Status: AC
  Administered 2021-07-24 – 2021-07-25 (×4): 1000 mg via ORAL
  Filled 2021-07-24 (×4): qty 2

## 2021-07-24 MED ORDER — CEFAZOLIN SODIUM-DEXTROSE 2-4 GM/100ML-% IV SOLN
2.0000 g | Freq: Four times a day (QID) | INTRAVENOUS | Status: AC
  Administered 2021-07-24 (×2): 2 g via INTRAVENOUS
  Filled 2021-07-24 (×2): qty 100

## 2021-07-24 MED ORDER — HYDROCHLOROTHIAZIDE 12.5 MG PO TABS
12.5000 mg | ORAL_TABLET | Freq: Every day | ORAL | Status: DC
  Administered 2021-07-24 – 2021-07-27 (×4): 12.5 mg via ORAL
  Filled 2021-07-24 (×4): qty 1

## 2021-07-24 MED ORDER — FENTANYL CITRATE (PF) 250 MCG/5ML IJ SOLN
INTRAMUSCULAR | Status: AC
  Filled 2021-07-24: qty 5

## 2021-07-24 MED ORDER — METOPROLOL TARTRATE 50 MG PO TABS
50.0000 mg | ORAL_TABLET | Freq: Once | ORAL | Status: AC
  Administered 2021-07-24: 50 mg via ORAL
  Filled 2021-07-24: qty 1

## 2021-07-24 MED ORDER — KETOROLAC TROMETHAMINE 30 MG/ML IJ SOLN
INTRAMUSCULAR | Status: AC
  Filled 2021-07-24: qty 1

## 2021-07-24 MED ORDER — ALBUMIN HUMAN 5 % IV SOLN
INTRAVENOUS | Status: DC | PRN
Start: 2021-07-24 — End: 2021-07-24

## 2021-07-24 MED ORDER — ALBUTEROL SULFATE (2.5 MG/3ML) 0.083% IN NEBU: 2.5000 mg | INHALATION_SOLUTION | Freq: Four times a day (QID) | RESPIRATORY_TRACT | Status: DC | PRN

## 2021-07-24 MED ORDER — DEXAMETHASONE SODIUM PHOSPHATE 10 MG/ML IJ SOLN
INTRAMUSCULAR | Status: AC
  Filled 2021-07-24: qty 1

## 2021-07-24 MED ORDER — NITROGLYCERIN 0.4 MG SL SUBL: 0.4000 mg | SUBLINGUAL_TABLET | SUBLINGUAL | Status: DC | PRN

## 2021-07-24 MED ORDER — AMISULPRIDE (ANTIEMETIC) 5 MG/2ML IV SOLN
10.0000 mg | Freq: Once | INTRAVENOUS | Status: AC | PRN
  Administered 2021-07-24: 10 mg via INTRAVENOUS

## 2021-07-24 MED ORDER — CEFAZOLIN SODIUM-DEXTROSE 2-4 GM/100ML-% IV SOLN
2.0000 g | INTRAVENOUS | Status: AC
  Administered 2021-07-24: 2 g via INTRAVENOUS
  Filled 2021-07-24: qty 100

## 2021-07-24 MED ORDER — TRANEXAMIC ACID 1000 MG/10ML IV SOLN
2000.0000 mg | INTRAVENOUS | Status: DC
  Filled 2021-07-24: qty 20

## 2021-07-24 MED ORDER — TRANEXAMIC ACID-NACL 1000-0.7 MG/100ML-% IV SOLN
1000.0000 mg | Freq: Once | INTRAVENOUS | Status: AC
  Administered 2021-07-24: 1000 mg via INTRAVENOUS
  Filled 2021-07-24: qty 100

## 2021-07-24 MED ORDER — DIPHENHYDRAMINE HCL 12.5 MG/5ML PO ELIX: 12.5000 mg | ORAL_SOLUTION | ORAL | Status: DC | PRN

## 2021-07-24 MED ORDER — PROPOFOL 10 MG/ML IV BOLUS
INTRAVENOUS | Status: DC | PRN
  Administered 2021-07-24: 200 mg via INTRAVENOUS

## 2021-07-24 MED ORDER — BUDESONIDE 0.25 MG/2ML IN SUSP
0.2500 mg | Freq: Two times a day (BID) | RESPIRATORY_TRACT | Status: DC
  Administered 2021-07-24 – 2021-07-27 (×5): 0.25 mg via RESPIRATORY_TRACT
  Filled 2021-07-24 (×5): qty 2

## 2021-07-24 MED ORDER — SUGAMMADEX SODIUM 200 MG/2ML IV SOLN
INTRAVENOUS | Status: DC | PRN
  Administered 2021-07-24: 200 mg via INTRAVENOUS

## 2021-07-24 MED ORDER — ONDANSETRON HCL 4 MG/2ML IJ SOLN
4.0000 mg | Freq: Four times a day (QID) | INTRAMUSCULAR | Status: DC | PRN
  Administered 2021-07-24 – 2021-07-25 (×3): 4 mg via INTRAVENOUS
  Filled 2021-07-24 (×3): qty 2

## 2021-07-24 MED ORDER — SODIUM CHLORIDE (PF) 0.9 % IJ SOLN
INTRAMUSCULAR | Status: DC | PRN
  Administered 2021-07-24: 20 mL

## 2021-07-24 MED ORDER — TRANEXAMIC ACID 1000 MG/10ML IV SOLN
INTRAVENOUS | Status: DC | PRN
  Administered 2021-07-24: 2000 mg via TOPICAL

## 2021-07-24 MED ORDER — ACETAMINOPHEN 500 MG PO TABS: 1000.0000 mg | ORAL_TABLET | Freq: Three times a day (TID) | ORAL | 0 refills | Status: AC | PRN

## 2021-07-24 MED ORDER — FLEET ENEMA 7-19 GM/118ML RE ENEM: 1.0000 | ENEMA | Freq: Once | RECTAL | Status: DC | PRN

## 2021-07-24 MED ORDER — LABETALOL HCL 5 MG/ML IV SOLN
INTRAVENOUS | Status: DC | PRN
  Administered 2021-07-24 (×2): 10 mg via INTRAVENOUS

## 2021-07-24 MED ORDER — CHLORHEXIDINE GLUCONATE 0.12 % MT SOLN
15.0000 mL | Freq: Once | OROMUCOSAL | Status: AC
  Administered 2021-07-24: 15 mL via OROMUCOSAL

## 2021-07-24 MED ORDER — ONDANSETRON HCL 4 MG/2ML IJ SOLN
INTRAMUSCULAR | Status: AC
  Filled 2021-07-24: qty 2

## 2021-07-24 MED ORDER — MENTHOL 3 MG MT LOZG: 1.0000 | LOZENGE | OROMUCOSAL | Status: DC | PRN

## 2021-07-24 MED ORDER — ROCURONIUM BROMIDE 10 MG/ML (PF) SYRINGE
PREFILLED_SYRINGE | INTRAVENOUS | Status: DC | PRN
  Administered 2021-07-24: 50 mg via INTRAVENOUS
  Administered 2021-07-24: 20 mg via INTRAVENOUS

## 2021-07-24 MED ORDER — ORAL CARE MOUTH RINSE: 15.0000 mL | Freq: Once | OROMUCOSAL | Status: AC

## 2021-07-24 MED ORDER — HYDRALAZINE HCL 20 MG/ML IJ SOLN
INTRAMUSCULAR | Status: AC
  Filled 2021-07-24: qty 1

## 2021-07-24 MED ORDER — ALBUTEROL SULFATE (2.5 MG/3ML) 0.083% IN NEBU
2.5000 mg | INHALATION_SOLUTION | Freq: Once | RESPIRATORY_TRACT | Status: AC
  Administered 2021-07-24: 2.5 mg via RESPIRATORY_TRACT

## 2021-07-24 MED ORDER — OXYCODONE HCL 5 MG PO TABS
5.0000 mg | ORAL_TABLET | ORAL | Status: DC | PRN
  Administered 2021-07-24: 5 mg via ORAL
  Administered 2021-07-25 – 2021-07-27 (×8): 10 mg via ORAL
  Filled 2021-07-24 (×9): qty 2

## 2021-07-24 MED ORDER — ALBUTEROL SULFATE (2.5 MG/3ML) 0.083% IN NEBU
INHALATION_SOLUTION | RESPIRATORY_TRACT | Status: AC
  Filled 2021-07-24: qty 3

## 2021-07-24 MED ORDER — SENNOSIDES-DOCUSATE SODIUM 8.6-50 MG PO TABS
2.0000 | ORAL_TABLET | ORAL | Status: DC
  Administered 2021-07-25: 2 via ORAL
  Filled 2021-07-24 (×2): qty 2

## 2021-07-24 MED ORDER — ACETAMINOPHEN 325 MG PO TABS
325.0000 mg | ORAL_TABLET | Freq: Four times a day (QID) | ORAL | Status: DC | PRN
  Administered 2021-07-25 – 2021-07-26 (×3): 650 mg via ORAL
  Filled 2021-07-24 (×3): qty 2

## 2021-07-24 MED ORDER — LABETALOL HCL 5 MG/ML IV SOLN
INTRAVENOUS | Status: AC
  Filled 2021-07-24: qty 4

## 2021-07-24 MED ORDER — ACETAMINOPHEN 500 MG PO TABS
1000.0000 mg | ORAL_TABLET | Freq: Once | ORAL | Status: AC
  Administered 2021-07-24: 1000 mg via ORAL
  Filled 2021-07-24: qty 2

## 2021-07-24 MED ORDER — ACETAMINOPHEN 500 MG PO TABS: 1000.0000 mg | ORAL_TABLET | Freq: Once | ORAL | Status: DC

## 2021-07-24 MED ORDER — PHENOL 1.4 % MT LIQD: 1.0000 | OROMUCOSAL | Status: DC | PRN

## 2021-07-24 MED ORDER — ONDANSETRON HCL 4 MG PO TABS
4.0000 mg | ORAL_TABLET | Freq: Four times a day (QID) | ORAL | Status: DC | PRN
  Administered 2021-07-25: 4 mg via ORAL
  Filled 2021-07-24: qty 1

## 2021-07-24 MED ORDER — ONDANSETRON HCL 4 MG/2ML IJ SOLN
INTRAMUSCULAR | Status: DC | PRN
  Administered 2021-07-24: 4 mg via INTRAVENOUS

## 2021-07-24 MED ORDER — SODIUM CHLORIDE 0.9 % IV SOLN: INTRAVENOUS | Status: DC

## 2021-07-24 MED ORDER — FENTANYL CITRATE (PF) 100 MCG/2ML IJ SOLN
INTRAMUSCULAR | Status: DC | PRN
  Administered 2021-07-24: 100 ug via INTRAVENOUS
  Administered 2021-07-24: 50 ug via INTRAVENOUS
  Administered 2021-07-24: 100 ug via INTRAVENOUS
  Administered 2021-07-24 (×2): 50 ug via INTRAVENOUS

## 2021-07-24 MED ORDER — SORBITOL 70 % SOLN
30.0000 mL | Freq: Every day | Status: DC | PRN
  Filled 2021-07-24 (×2): qty 30

## 2021-07-24 MED ORDER — DOCUSATE SODIUM 100 MG PO CAPS
100.0000 mg | ORAL_CAPSULE | Freq: Two times a day (BID) | ORAL | Status: DC
  Administered 2021-07-24 – 2021-07-27 (×6): 100 mg via ORAL
  Filled 2021-07-24 (×6): qty 1

## 2021-07-24 MED ORDER — SODIUM CHLORIDE (PF) 0.9 % IJ SOLN
INTRAMUSCULAR | Status: AC
  Filled 2021-07-24: qty 20

## 2021-07-24 MED ORDER — LOSARTAN POTASSIUM 50 MG PO TABS
100.0000 mg | ORAL_TABLET | Freq: Every day | ORAL | Status: DC
  Administered 2021-07-24 – 2021-07-27 (×4): 100 mg via ORAL
  Filled 2021-07-24 (×4): qty 2

## 2021-07-24 MED ORDER — LACTATED RINGERS IV SOLN: INTRAVENOUS | Status: DC

## 2021-07-24 MED ORDER — ASPIRIN EC 81 MG PO TBEC: 81.0000 mg | DELAYED_RELEASE_TABLET | Freq: Two times a day (BID) | ORAL | 0 refills | Status: AC

## 2021-07-24 MED ORDER — BUPIVACAINE LIPOSOME 1.3 % IJ SUSP
INTRAMUSCULAR | Status: DC | PRN
  Administered 2021-07-24: 10 mL

## 2021-07-24 MED ORDER — METOCLOPRAMIDE HCL 5 MG/ML IJ SOLN
5.0000 mg | Freq: Three times a day (TID) | INTRAMUSCULAR | Status: DC | PRN
  Administered 2021-07-24: 10 mg via INTRAVENOUS
  Filled 2021-07-24: qty 2

## 2021-07-24 MED ORDER — HYDRALAZINE HCL 20 MG/ML IJ SOLN
INTRAMUSCULAR | Status: DC | PRN
  Administered 2021-07-24: 8 mg via INTRAVENOUS
  Administered 2021-07-24: 4 mg via INTRAVENOUS

## 2021-07-24 MED ORDER — WATER FOR IRRIGATION, STERILE IR SOLN
Status: DC | PRN
  Administered 2021-07-24: 2000 mL via TOPICAL

## 2021-07-24 MED ORDER — TRAMADOL HCL 50 MG PO TABS: 50.0000 mg | ORAL_TABLET | Freq: Four times a day (QID) | ORAL | Status: DC | PRN

## 2021-07-24 MED ORDER — ROCURONIUM BROMIDE 10 MG/ML (PF) SYRINGE
PREFILLED_SYRINGE | INTRAVENOUS | Status: AC
  Filled 2021-07-24: qty 10

## 2021-07-24 MED ORDER — LIDOCAINE HCL (PF) 2 % IJ SOLN
INTRAMUSCULAR | Status: AC
  Filled 2021-07-24: qty 5

## 2021-07-24 MED ORDER — SODIUM CHLORIDE 0.9 % IR SOLN
Status: DC | PRN
  Administered 2021-07-24: 1000 mL

## 2021-07-24 MED ORDER — ASPIRIN 81 MG PO CHEW
81.0000 mg | CHEWABLE_TABLET | Freq: Two times a day (BID) | ORAL | Status: DC
  Administered 2021-07-24 – 2021-07-27 (×6): 81 mg via ORAL
  Filled 2021-07-24 (×6): qty 1

## 2021-07-24 MED ORDER — AMISULPRIDE (ANTIEMETIC) 5 MG/2ML IV SOLN
INTRAVENOUS | Status: AC
  Filled 2021-07-24: qty 4

## 2021-07-24 MED ORDER — LIDOCAINE HCL (CARDIAC) PF 100 MG/5ML IV SOSY
PREFILLED_SYRINGE | INTRAVENOUS | Status: DC | PRN
Start: 2021-07-24 — End: 2021-07-24
  Administered 2021-07-24: 100 mg via INTRAVENOUS

## 2021-07-24 MED ORDER — LOSARTAN POTASSIUM-HCTZ 100-12.5 MG PO TABS: 1.0000 | ORAL_TABLET | Freq: Every day | ORAL | Status: DC

## 2021-07-24 MED ORDER — KETOROLAC TROMETHAMINE 30 MG/ML IJ SOLN
INTRAMUSCULAR | Status: DC | PRN
  Administered 2021-07-24: 15 mg via INTRAVENOUS

## 2021-07-24 MED ORDER — OXYCODONE HCL 5 MG PO TABS: ORAL_TABLET | ORAL | 0 refills | Status: DC

## 2021-07-24 MED ORDER — DEXAMETHASONE SODIUM PHOSPHATE 10 MG/ML IJ SOLN
INTRAMUSCULAR | Status: DC | PRN
  Administered 2021-07-24: 5 mg via INTRAVENOUS

## 2021-07-24 MED ORDER — BUPIVACAINE LIPOSOME 1.3 % IJ SUSP: 10.0000 mL | Freq: Once | INTRAMUSCULAR | Status: DC

## 2021-07-24 MED ORDER — POVIDONE-IODINE 10 % EX SWAB
2.0000 "application " | Freq: Once | CUTANEOUS | Status: AC
  Administered 2021-07-24: 2 via TOPICAL

## 2021-07-24 MED ORDER — FENTANYL CITRATE (PF) 100 MCG/2ML IJ SOLN
INTRAMUSCULAR | Status: AC
  Filled 2021-07-24: qty 2

## 2021-07-24 MED ORDER — PROPOFOL 10 MG/ML IV BOLUS
INTRAVENOUS | Status: AC
  Filled 2021-07-24: qty 20

## 2021-07-24 MED ORDER — BUPIVACAINE LIPOSOME 1.3 % IJ SUSP
INTRAMUSCULAR | Status: AC
  Filled 2021-07-24: qty 10

## 2021-07-24 MED ORDER — METOCLOPRAMIDE HCL 5 MG PO TABS: 5.0000 mg | ORAL_TABLET | Freq: Three times a day (TID) | ORAL | Status: DC | PRN

## 2021-07-24 MED ORDER — HYDROMORPHONE HCL 1 MG/ML IJ SOLN
0.5000 mg | INTRAMUSCULAR | Status: DC | PRN
  Administered 2021-07-24 – 2021-07-25 (×3): 1 mg via INTRAVENOUS
  Filled 2021-07-24 (×3): qty 1

## 2021-07-24 MED ORDER — FENTANYL CITRATE PF 50 MCG/ML IJ SOSY: 25.0000 ug | PREFILLED_SYRINGE | INTRAMUSCULAR | Status: DC | PRN

## 2021-07-24 MED ORDER — TRANEXAMIC ACID-NACL 1000-0.7 MG/100ML-% IV SOLN
1000.0000 mg | INTRAVENOUS | Status: AC
  Administered 2021-07-24: 1000 mg via INTRAVENOUS
  Filled 2021-07-24: qty 100

## 2021-07-24 MED ORDER — METOPROLOL TARTRATE 50 MG PO TABS
50.0000 mg | ORAL_TABLET | Freq: Two times a day (BID) | ORAL | Status: DC
  Administered 2021-07-24 – 2021-07-27 (×7): 50 mg via ORAL
  Filled 2021-07-24 (×7): qty 1

## 2021-07-24 MED ORDER — BUPIVACAINE-EPINEPHRINE 0.25% -1:200000 IJ SOLN
INTRAMUSCULAR | Status: DC | PRN
Start: 2021-07-24 — End: 2021-07-24
  Administered 2021-07-24: 30 mL

## 2021-07-24 MED ORDER — BUPIVACAINE-EPINEPHRINE (PF) 0.25% -1:200000 IJ SOLN
INTRAMUSCULAR | Status: AC
  Filled 2021-07-24: qty 30

## 2021-07-24 MED ORDER — ROSUVASTATIN CALCIUM 20 MG PO TABS
40.0000 mg | ORAL_TABLET | ORAL | Status: DC
  Administered 2021-07-25 – 2021-07-27 (×2): 40 mg via ORAL
  Filled 2021-07-24 (×3): qty 2

## 2021-07-24 MED ORDER — ALBUMIN HUMAN 5 % IV SOLN
INTRAVENOUS | Status: AC
  Filled 2021-07-24: qty 250

## 2021-07-24 MED ORDER — PROPOFOL 1000 MG/100ML IV EMUL
INTRAVENOUS | Status: AC
  Filled 2021-07-24: qty 100

## 2021-07-24 SURGICAL SUPPLY — 64 items
ACETAB CUP W GRIPTION 54MM (Plate) ×1 IMPLANT
ACETAB CUP W/GRIPTION 54 (Plate) ×2 IMPLANT
APL SKNCLS STERI-STRIP NONHPOA (GAUZE/BANDAGES/DRESSINGS) ×1
BAG COUNTER SPONGE SURGICOUNT (BAG) ×1 IMPLANT
BAG DECANTER FOR FLEXI CONT (MISCELLANEOUS) ×3 IMPLANT
BAG SPEC THK2 15X12 ZIP CLS (MISCELLANEOUS) ×1
BAG SPNG CNTER NS LX DISP (BAG) ×1
BAG SURGICOUNT SPONGE COUNTING (BAG) ×1
BAG ZIPLOCK 12X15 (MISCELLANEOUS) ×3 IMPLANT
BENZOIN TINCTURE PRP APPL 2/3 (GAUZE/BANDAGES/DRESSINGS) ×3 IMPLANT
BLADE SAW SAG 73X25 THK (BLADE) ×2
BLADE SAW SGTL 73X25 THK (BLADE) ×1 IMPLANT
CLOSURE STERI-STRIP 1/2X4 (GAUZE/BANDAGES/DRESSINGS) ×1
CLSR STERI-STRIP ANTIMIC 1/2X4 (GAUZE/BANDAGES/DRESSINGS) ×2 IMPLANT
COVER SURGICAL LIGHT HANDLE (MISCELLANEOUS) ×3 IMPLANT
CUP ACETAB W/GRIPTION 54 (Plate) IMPLANT
DRAPE 3/4 80X56 (DRAPES) ×3 IMPLANT
DRAPE INCISE IOBAN 66X45 STRL (DRAPES) ×3 IMPLANT
DRAPE ORTHO SPLIT 77X108 STRL (DRAPES) ×6
DRAPE POUCH INSTRU U-SHP 10X18 (DRAPES) ×3 IMPLANT
DRAPE SURG ORHT 6 SPLT 77X108 (DRAPES) ×2 IMPLANT
DRAPE U-SHAPE 47X51 STRL (DRAPES) ×3 IMPLANT
DRESSING AQUACEL AG SP 3.5X10 (GAUZE/BANDAGES/DRESSINGS) ×1 IMPLANT
DRSG AQUACEL AG ADV 3.5X10 (GAUZE/BANDAGES/DRESSINGS) ×2 IMPLANT
DRSG AQUACEL AG SP 3.5X10 (GAUZE/BANDAGES/DRESSINGS) ×3
DURAPREP 26ML APPLICATOR (WOUND CARE) ×3 IMPLANT
ELECT BLADE TIP CTD 4 INCH (ELECTRODE) ×3 IMPLANT
ELECT REM PT RETURN 15FT ADLT (MISCELLANEOUS) ×3 IMPLANT
FACESHIELD WRAPAROUND (MASK) ×9 IMPLANT
FACESHIELD WRAPAROUND OR TEAM (MASK) ×3 IMPLANT
GLOVE SRG 8 PF TXTR STRL LF DI (GLOVE) ×2 IMPLANT
GLOVE SURG ORTHO LTX SZ8 (GLOVE) ×3 IMPLANT
GLOVE SURG POLYISO LF SZ7.5 (GLOVE) ×3 IMPLANT
GLOVE SURG UNDER POLY LF SZ8 (GLOVE) ×6
GOWN STRL REUS W/TWL XL LVL3 (GOWN DISPOSABLE) ×6 IMPLANT
HEAD CERAMIC 36 PLUS5 (Hips) ×2 IMPLANT
HOLDER FOLEY CATH W/STRAP (MISCELLANEOUS) ×1 IMPLANT
HOOD PEEL AWAY FLYTE STAYCOOL (MISCELLANEOUS) ×5 IMPLANT
IMMOBILIZER KNEE 20 (SOFTGOODS) ×3
IMMOBILIZER KNEE 20 THIGH 36 (SOFTGOODS) ×1 IMPLANT
IMMOBILIZER KNEE 22 UNIV (SOFTGOODS) ×2 IMPLANT
KIT BASIN OR (CUSTOM PROCEDURE TRAY) ×3 IMPLANT
KIT TURNOVER KIT A (KITS) ×2 IMPLANT
LINER NEUTRAL 52X36X54 PLUS 4 (Liner) ×2 IMPLANT
MANIFOLD NEPTUNE II (INSTRUMENTS) ×3 IMPLANT
NEEDLE HYPO 22GX1.5 SAFETY (NEEDLE) ×6 IMPLANT
NS IRRIG 1000ML POUR BTL (IV SOLUTION) ×3 IMPLANT
PACK TOTAL JOINT (CUSTOM PROCEDURE TRAY) ×3 IMPLANT
PROTECTOR NERVE ULNAR (MISCELLANEOUS) ×3 IMPLANT
SPIKE FLUID TRANSFER (MISCELLANEOUS) ×9 IMPLANT
SPONGE T-LAP 18X18 ~~LOC~~+RFID (SPONGE) ×9 IMPLANT
STAPLER VISISTAT 35W (STAPLE) IMPLANT
STEM FEM ACTIS HIGH SZ3 (Stem) ×2 IMPLANT
SUCTION FRAZIER HANDLE 12FR (TUBING) ×3
SUCTION TUBE FRAZIER 12FR DISP (TUBING) ×1 IMPLANT
SUT ETHIBOND NAB CT1 #1 30IN (SUTURE) ×12 IMPLANT
SUT MNCRL AB 3-0 PS2 18 (SUTURE) ×3 IMPLANT
SUT VIC AB 0 CT1 36 (SUTURE) ×6 IMPLANT
SUT VIC AB 2-0 CT1 27 (SUTURE) ×6
SUT VIC AB 2-0 CT1 TAPERPNT 27 (SUTURE) ×2 IMPLANT
SYR CONTROL 10ML LL (SYRINGE) ×6 IMPLANT
TOWEL OR 17X26 10 PK STRL BLUE (TOWEL DISPOSABLE) ×3 IMPLANT
TRAY FOLEY MTR SLVR 16FR STAT (SET/KITS/TRAYS/PACK) ×1 IMPLANT
WATER STERILE IRR 1000ML POUR (IV SOLUTION) ×6 IMPLANT

## 2021-07-24 NOTE — Anesthesia Postprocedure Evaluation (Signed)
Anesthesia Post Note ? ?Patient: NIANNA IGO ? ?Procedure(s) Performed: TOTAL HIP ARTHROPLASTY (Right: Hip) ? ?  ? ?Patient location during evaluation: PACU ?Anesthesia Type: General ?Level of consciousness: awake and alert ?Pain management: pain level controlled ?Vital Signs Assessment: post-procedure vital signs reviewed and stable ?Respiratory status: spontaneous breathing, nonlabored ventilation, respiratory function stable and patient connected to nasal cannula oxygen ?Cardiovascular status: blood pressure returned to baseline and stable ?Postop Assessment: no apparent nausea or vomiting ?Anesthetic complications: no ? ? ?No notable events documented. ? ?Last Vitals:  ?Vitals:  ? 07/24/21 1153 07/24/21 1307  ?BP: (!) 146/62 (!) 146/101  ?Pulse: 75 64  ?Resp:  14  ?Temp: 36.5 ?C 36.8 ?C  ?SpO2: 100% 98%  ?  ?Last Pain:  ?Vitals:  ? 07/24/21 1307  ?TempSrc: Oral  ?PainSc: Asleep  ? ? ?  ?  ?  ?  ?  ?  ? ?Suzette Battiest E ? ? ? ? ?

## 2021-07-24 NOTE — Discharge Instructions (Signed)
INSTRUCTIONS AFTER JOINT REPLACEMENT  ° °Remove items at home which could result in a fall. This includes throw rugs or furniture in walking pathways °ICE to the affected joint every three hours while awake for 30 minutes at a time, for at least the first 3-5 days, and then as needed for pain and swelling.  Continue to use ice for pain and swelling. You may notice swelling that will progress down to the foot and ankle.  This is normal after surgery.  Elevate your leg when you are not up walking on it.   °Continue to use the breathing machine you got in the hospital (incentive spirometer) which will help keep your temperature down.  It is common for your temperature to cycle up and down following surgery, especially at night when you are not up moving around and exerting yourself.  The breathing machine keeps your lungs expanded and your temperature down. ° ° °DIET:  As you were doing prior to hospitalization, we recommend a well-balanced diet. ° °DRESSING / WOUND CARE / SHOWERING ° °Keep the surgical dressing until follow up.  The dressing is water proof, so you can shower without any extra covering.  IF THE DRESSING FALLS OFF or the wound gets wet inside, change the dressing with sterile gauze.  Please use good hand washing techniques before changing the dressing.  Do not use any lotions or creams on the incision until instructed by your surgeon.   ° °ACTIVITY ° °Increase activity slowly as tolerated, but follow the weight bearing instructions below.   °No driving for 6 weeks or until further direction given by your physician.  You cannot drive while taking narcotics.  °No lifting or carrying greater than 10 lbs. until further directed by your surgeon. °Avoid periods of inactivity such as sitting longer than an hour when not asleep. This helps prevent blood clots.  °You may return to work once you are authorized by your doctor.  ° ° ° °WEIGHT BEARING  ° °Weight bearing as tolerated with assist device (walker, cane,  etc) as directed, use it as long as suggested by your surgeon or therapist, typically at least 4-6 weeks. ° ° °EXERCISES ° °Results after joint replacement surgery are often greatly improved when you follow the exercise, range of motion and muscle strengthening exercises prescribed by your doctor. Safety measures are also important to protect the joint from further injury. Any time any of these exercises cause you to have increased pain or swelling, decrease what you are doing until you are comfortable again and then slowly increase them. If you have problems or questions, call your caregiver or physical therapist for advice.  ° °Rehabilitation is important following a joint replacement. After just a few days of immobilization, the muscles of the leg can become weakened and shrink (atrophy).  These exercises are designed to build up the tone and strength of the thigh and leg muscles and to improve motion. Often times heat used for twenty to thirty minutes before working out will loosen up your tissues and help with improving the range of motion but do not use heat for the first two weeks following surgery (sometimes heat can increase post-operative swelling).  ° °These exercises can be done on a training (exercise) mat, on the floor, on a table or on a bed. Use whatever works the best and is most comfortable for you.    Use music or television while you are exercising so that the exercises are a pleasant break in your   day. This will make your life better with the exercises acting as a break in your routine that you can look forward to.   Perform all exercises about fifteen times, three times per day or as directed.  You should exercise both the operative leg and the other leg as well.  Exercises include:   Quad Sets - Tighten up the muscle on the front of the thigh (Quad) and hold for 5-10 seconds.   Straight Leg Raises - With your knee straight (if you were given a brace, keep it on), lift the leg to 60  degrees, hold for 3 seconds, and slowly lower the leg.  Perform this exercise against resistance later as your leg gets stronger.  Leg Slides: Lying on your back, slowly slide your foot toward your buttocks, bending your knee up off the floor (only go as far as is comfortable). Then slowly slide your foot back down until your leg is flat on the floor again.  Angel Wings: Lying on your back spread your legs to the side as far apart as you can without causing discomfort.  Hamstring Strength:  Lying on your back, push your heel against the floor with your leg straight by tightening up the muscles of your buttocks.  Repeat, but this time bend your knee to a comfortable angle, and push your heel against the floor.  You may put a pillow under the heel to make it more comfortable if necessary.   A rehabilitation program following joint replacement surgery can speed recovery and prevent re-injury in the future due to weakened muscles. Contact your doctor or a physical therapist for more information on knee rehabilitation.    CONSTIPATION  Constipation is defined medically as fewer than three stools per week and severe constipation as less than one stool per week.  Even if you have a regular bowel pattern at home, your normal regimen is likely to be disrupted due to multiple reasons following surgery.  Combination of anesthesia, postoperative narcotics, change in appetite and fluid intake all can affect your bowels.   YOU MUST use at least one of the following options; they are listed in order of increasing strength to get the job done.  They are all available over the counter, and you may need to use some, POSSIBLY even all of these options:    Drink plenty of fluids (prune juice may be helpful) and high fiber foods Colace 100 mg by mouth twice a day  Senokot for constipation as directed and as needed Dulcolax (bisacodyl), take with full glass of water  Miralax (polyethylene glycol) once or twice a day as  needed.  If you have tried all these things and are unable to have a bowel movement in the first 3-4 days after surgery call either your surgeon or your primary doctor.    If you experience loose stools or diarrhea, hold the medications until you stool forms back up.  If your symptoms do not get better within 1 week or if they get worse, check with your doctor.  If you experience "the worst abdominal pain ever" or develop nausea or vomiting, please contact the office immediately for further recommendations for treatment.   ITCHING:  If you experience itching with your medications, try taking only a single pain pill, or even half a pain pill at a time.  You can also use Benadryl over the counter for itching or also to help with sleep.   TED HOSE STOCKINGS:  Use stockings on both  legs until for at least 2 weeks or as directed by physician office. They may be removed at night for sleeping.  MEDICATIONS:  See your medication summary on the After Visit Summary that nursing will review with you.  You may have some home medications which will be placed on hold until you complete the course of blood thinner medication.  It is important for you to complete the blood thinner medication as prescribed.  PRECAUTIONS:  If you experience chest pain or shortness of breath - call 911 immediately for transfer to the hospital emergency department.   If you develop a fever greater that 101 F, purulent drainage from wound, increased redness or drainage from wound, foul odor from the wound/dressing, or calf pain - CONTACT YOUR SURGEON.                                                   FOLLOW-UP APPOINTMENTS:  If you do not already have a post-op appointment, please call the office for an appointment to be seen by your surgeon.  Guidelines for how soon to be seen are listed in your After Visit Summary, but are typically between 1-4 weeks after surgery.  OTHER INSTRUCTIONS:   Knee Replacement:  Do not place pillow  under knee, focus on keeping the knee straight while resting. CPM instructions: 0-90 degrees, 2 hours in the morning, 2 hours in the afternoon, and 2 hours in the evening. Place foam block, curve side up under heel at all times except when in CPM or when walking.  DO NOT modify, tear, cut, or change the foam block in any way.  POST-OPERATIVE OPIOID TAPER INSTRUCTIONS: It is important to wean off of your opioid medication as soon as possible. If you do not need pain medication after your surgery it is ok to stop day one. Opioids include: Codeine, Hydrocodone(Norco, Vicodin), Oxycodone(Percocet, oxycontin) and hydromorphone amongst others.  Long term and even short term use of opiods can cause: Increased pain response Dependence Constipation Depression Respiratory depression And more.  Withdrawal symptoms can include Flu like symptoms Nausea, vomiting And more Techniques to manage these symptoms Hydrate well Eat regular healthy meals Stay active Use relaxation techniques(deep breathing, meditating, yoga) Do Not substitute Alcohol to help with tapering If you have been on opioids for less than two weeks and do not have pain than it is ok to stop all together.  Plan to wean off of opioids This plan should start within one week post op of your joint replacement. Maintain the same interval or time between taking each dose and first decrease the dose.  Cut the total daily intake of opioids by one tablet each day Next start to increase the time between doses. The last dose that should be eliminated is the evening dose.   MAKE SURE YOU:  Understand these instructions.  Get help right away if you are not doing well or get worse.    Thank you for letting us be a part of your medical care team.  It is a privilege we respect greatly.  We hope these instructions will help you stay on track for a fast and full recovery!

## 2021-07-24 NOTE — Brief Op Note (Signed)
07/24/2021 ? ?10:18 AM ? ?PATIENT:  Sandra Lawrence  76 y.o. female ? ?PRE-OPERATIVE DIAGNOSIS:  OA RIGHT HIP ? ?POST-OPERATIVE DIAGNOSIS:  OA RIGHT HIP ? ?PROCEDURE:  Procedure(s): ?TOTAL HIP ARTHROPLASTY (Right) ? ?SURGEON:  Surgeon(s) and Role: ?   Earlie Server, MD - Primary ?   Willaim Sheng, MD - Assisting ? ?PHYSICIAN ASSISTANT: Chriss Czar, PA-C ? ?ASSISTANTS: Marchwiany, Shahed Yeoman  ? ?ANESTHESIA:   local and general ? ?EBL:  350 mL  ? ?BLOOD ADMINISTERED:none ? ?DRAINS: none  ? ?LOCAL MEDICATIONS USED:  MARCAINE    ? ?SPECIMEN:  No Specimen ? ?DISPOSITION OF SPECIMEN:  N/A ? ?COUNTS:  YES ? ?TOURNIQUET:  * No tourniquets in log * ? ?DICTATION: .Other Dictation: Dictation Number unknown ? ?PLAN OF CARE: Admit for overnight observation ? ?PATIENT DISPOSITION:  PACU - hemodynamically stable. ?  ?Delay start of Pharmacological VTE agent (>24hrs) due to surgical blood loss or risk of bleeding: yes ? ?

## 2021-07-24 NOTE — Anesthesia Procedure Notes (Signed)
Procedure Name: Intubation ?Date/Time: 07/24/2021 7:54 AM ?Performed by: Raenette Rover, CRNA ?Pre-anesthesia Checklist: Patient identified, Emergency Drugs available, Suction available and Patient being monitored ?Patient Re-evaluated:Patient Re-evaluated prior to induction ?Oxygen Delivery Method: Circle system utilized ?Preoxygenation: Pre-oxygenation with 100% oxygen ?Induction Type: IV induction ?Ventilation: Mask ventilation without difficulty ?Laryngoscope Size: Sabra Heck and 2 ?Grade View: Grade I ?Tube type: Oral ?Tube size: 7.0 mm ?Number of attempts: 1 ?Airway Equipment and Method: Stylet ?Placement Confirmation: ETT inserted through vocal cords under direct vision, positive ETCO2 and breath sounds checked- equal and bilateral ?Secured at: 22 cm ?Tube secured with: Tape ?Dental Injury: Teeth and Oropharynx as per pre-operative assessment  ? ? ? ? ?

## 2021-07-24 NOTE — Interval H&P Note (Signed)
History and Physical Interval Note: ? ?07/24/2021 ?7:38 AM ? ?Sandra Lawrence  has presented today for surgery, with the diagnosis of OA RIGHT HIP.  The various methods of treatment have been discussed with the patient and family. After consideration of risks, benefits and other options for treatment, the patient has consented to  Procedure(s): ?TOTAL HIP ARTHROPLASTY (Right) as a surgical intervention.  The patient's history has been reviewed, patient examined, no change in status, stable for surgery.  I have reviewed the patient's chart and labs.  Questions were answered to the patient's satisfaction.   ? ? ?W D Valeta Harms ? ? ?

## 2021-07-24 NOTE — Transfer of Care (Signed)
Immediate Anesthesia Transfer of Care Note ? ?Patient: Sandra Lawrence ? ?Procedure(s) Performed: TOTAL HIP ARTHROPLASTY (Right: Hip) ? ?Patient Location: PACU ? ?Anesthesia Type:General ? ?Level of Consciousness: drowsy ? ?Airway & Oxygen Therapy: Patient Spontanous Breathing and Patient connected to face mask oxygen ? ?Post-op Assessment: Report given to RN and Post -op Vital signs reviewed and stable ? ?Post vital signs: Reviewed and stable ? ?Last Vitals:  ?Vitals Value Taken Time  ?BP 170/75 07/24/21 1015  ?Temp    ?Pulse 84 07/24/21 1017  ?Resp 25 07/24/21 1017  ?SpO2 98 % 07/24/21 1017  ?Vitals shown include unvalidated device data. ? ?Last Pain:  ?Vitals:  ? 07/24/21 0635  ?TempSrc:   ?PainSc: 9   ?   ? ?  ? ?Complications: No notable events documented. ?

## 2021-07-24 NOTE — Anesthesia Preprocedure Evaluation (Addendum)
Anesthesia Evaluation  ?Patient identified by MRN, date of birth, ID band ?Patient awake ? ? ? ?Reviewed: ?Allergy & Precautions, NPO status , Patient's Chart, lab work & pertinent test results ? ?Airway ?Mallampati: II ? ?TM Distance: >3 FB ?Neck ROM: Full ? ? ? Dental ? ?(+) Dental Advisory Given ?  ?Pulmonary ?asthma , sleep apnea , former smoker,  ?  ?breath sounds clear to auscultation ? ? ? ? ? ? Cardiovascular ?hypertension, Pt. on medications and Pt. on home beta blockers ? ?Rhythm:Regular Rate:Normal ? ?Low risk 06/17/21 nuclear stress test without ischemia. ? ?Normal EF. Valves okay ?  ?Neuro/Psych ?negative neurological ROS ?   ? GI/Hepatic ?Neg liver ROS, GERD  ,  ?Endo/Other  ?diabetes, Type 2 ? Renal/GU ?CRFRenal disease  ? ?  ?Musculoskeletal ? ? Abdominal ?  ?Peds ? Hematology ?  ?Anesthesia Other Findings ? ? Reproductive/Obstetrics ? ?  ? ? ? ? ? ? ? ? ? ? ? ? ? ?  ?  ? ? ? ? ? ? ? ?Anesthesia Physical ?Anesthesia Plan ? ?ASA: 3 ? ?Anesthesia Plan: General  ? ?Post-op Pain Management: Tylenol PO (pre-op)* and Toradol IV (intra-op)*  ? ?Induction: Intravenous ? ?PONV Risk Score and Plan: 3 and Dexamethasone, Ondansetron and Treatment may vary due to age or medical condition ? ?Airway Management Planned: Oral ETT ? ?Additional Equipment: None ? ?Intra-op Plan:  ? ?Post-operative Plan: Extubation in OR ? ?Informed Consent: I have reviewed the patients History and Physical, chart, labs and discussed the procedure including the risks, benefits and alternatives for the proposed anesthesia with the patient or authorized representative who has indicated his/her understanding and acceptance.  ? ? ? ?Dental advisory given ? ?Plan Discussed with: CRNA ? ?Anesthesia Plan Comments: (Pt declines SAB.)  ? ? ? ? ? ?Anesthesia Quick Evaluation ? ?

## 2021-07-24 NOTE — Op Note (Signed)
NAME: Sandra Lawrence, Sandra S. ?MEDICAL RECORD NO: [id number] ?ACCOUNT NO: [id number] ?DATE OF BIRTH: February 20, 1946 ?FACILITY: [facility] ?LOCATION: [id number] ?PHYSICIAN: W D. Valeta Harms., MD ? ?Operative Report  ? ?DATE OF PROCEDURE: 07/24/2021 ? ?PREOPERATIVE DIAGNOSIS:  Severe osteoarthritis, right hip. ? ?POSTOPERATIVE DIAGNOSIS:  Severe osteoarthritis, right hip. ? ?PROCEDURE:  Right total hip replacement. ? ?SURGEON:  W D. Valeta Harms., MD. ? ?ASSISTANTZachery Dakins. ? ?ANESTHESIA:  General. ? ?ESTIMATED BLOOD LOSS:  350 mL. ? ?COMPONENT SIZES:  DePuy ACTIS stem size 3+5 mm neck length, 36 mm ceramic hip ball, 54 mm Gription acetabular cup with a Pinnacle polyethylene liner +4 mm 10-degree. ? ?DESCRIPTION OF PROCEDURE:  Lateral position with posterior approach to the hip, made.  We split the iliotibial band and gluteus maximus fascia, split the short external rotators, did a T capsulotomy in the hip.  We delivered a very diseased arthritic  ?hip, cut it about 1 fingerbreadth above the lesser trochanter.  We progressively broached this with the broaches to accept a #3 ACTIS stem. ? ?Attention was next directed to the acetabulum.  Acetabular retractors placed anteriorly and inferiorly with wing retractors superiorly.  We debrided the labrum, released the transverse ligament and removed significant osteophytes from the anterior  ?inferior aspect of the hip.  We then progressively reamed for a 1 mm under reaming to accept a 54 mm cup.  The final cup was positioned in about 40 degrees of abduction with 10-15 degrees of anteversion.  We then trialled off the trial liner and the  ?trial broach and settled on the above-mentioned sizes.  The final acetabular cup was inserted with the liner at about the 9 o'clock position.  Clide Dales was inserted with an excellent fit with no instability noted of the prosthesis.  The hip was extremely  ?stable.  There was no tendency for any anterior dislocation with external rotation and extension.  Likewise with  full flexion, complete, adduction and internal rotation approaching 90 degrees the hip was noted to be stable and leg lengths equalized.   ?Wound was irrigated.  Closure was affected on the split fascia with a running Ethibond, subcutaneous tissues with 0 and 2-0 Vicryl and the skin with Monocryl.  Taken to recovery room in stable condition. ? ? ?PUS ?D: 07/24/2021 9:38:36 am T: 07/24/2021 1:05:00 pm  ?JOB: [id number]/ [id number]  ?

## 2021-07-25 DIAGNOSIS — I2510 Atherosclerotic heart disease of native coronary artery without angina pectoris: Secondary | ICD-10-CM | POA: Diagnosis not present

## 2021-07-25 DIAGNOSIS — I130 Hypertensive heart and chronic kidney disease with heart failure and stage 1 through stage 4 chronic kidney disease, or unspecified chronic kidney disease: Secondary | ICD-10-CM | POA: Diagnosis not present

## 2021-07-25 DIAGNOSIS — G4733 Obstructive sleep apnea (adult) (pediatric): Secondary | ICD-10-CM | POA: Diagnosis not present

## 2021-07-25 DIAGNOSIS — J45909 Unspecified asthma, uncomplicated: Secondary | ICD-10-CM | POA: Diagnosis not present

## 2021-07-25 DIAGNOSIS — M1611 Unilateral primary osteoarthritis, right hip: Secondary | ICD-10-CM | POA: Diagnosis not present

## 2021-07-25 DIAGNOSIS — Z96651 Presence of right artificial knee joint: Secondary | ICD-10-CM | POA: Diagnosis not present

## 2021-07-25 DIAGNOSIS — E119 Type 2 diabetes mellitus without complications: Secondary | ICD-10-CM | POA: Diagnosis not present

## 2021-07-25 DIAGNOSIS — I5040 Unspecified combined systolic (congestive) and diastolic (congestive) heart failure: Secondary | ICD-10-CM | POA: Diagnosis not present

## 2021-07-25 DIAGNOSIS — N189 Chronic kidney disease, unspecified: Secondary | ICD-10-CM | POA: Diagnosis not present

## 2021-07-25 LAB — TYPE AND SCREEN
ABO/RH(D): A POS
Antibody Screen: POSITIVE
Unit division: 0
Unit division: 0

## 2021-07-25 LAB — BPAM RBC
Blood Product Expiration Date: 202303222359
Blood Product Expiration Date: 202303222359
Unit Type and Rh: 6200
Unit Type and Rh: 6200

## 2021-07-25 LAB — GLUCOSE, CAPILLARY
Glucose-Capillary: 131 mg/dL — ABNORMAL HIGH (ref 70–99)
Glucose-Capillary: 132 mg/dL — ABNORMAL HIGH (ref 70–99)
Glucose-Capillary: 137 mg/dL — ABNORMAL HIGH (ref 70–99)
Glucose-Capillary: 200 mg/dL — ABNORMAL HIGH (ref 70–99)

## 2021-07-25 NOTE — Progress Notes (Signed)
PT Cancellation Note ? ?Patient Details ?Name: Sandra Lawrence ?MRN: [id number] ?DOB: Apr 10, 1946 ? ? ?Cancelled Treatment:    Reason Eval/Treat Not Completed:  Attempted 2nd tx session-pt declined participation. She reported fatigue and wanted to rest. Will continue PT on tomorrow. ? ? ? ?Doreatha Massed, PT ?Acute Rehabilitation  ?Office: [phone] ?Pager: [phone] ? ?  ?

## 2021-07-25 NOTE — Progress Notes (Signed)
? ? ?  Subjective: ?Patient reports pain as moderate. Better with medicine. Was nauseous yesterday but better today. Tolerating diet.  Urinating.   No CP, SOB. Working with PT on mobilizing OOB. Feels she needs to work with them more.  ? ?Objective:  ? ?VITALS:   ?Vitals:  ? 07/24/21 2035 07/24/21 2100 07/25/21 0135 07/25/21 0509  ?BP:  (!) 132/59 (!) 127/54 (!) 147/52  ?Pulse:  76 68 62  ?Resp:  '18 17 17  '$ ?Temp:  99.3 ?F (37.4 ?C) 98.2 ?F (36.8 ?C) 98.2 ?F (36.8 ?C)  ?TempSrc:  Oral Oral Oral  ?SpO2: 99% 99% 98% 100%  ?Weight:      ?Height:      ? ?CBC Latest Ref Rng & Units 07/17/2021 01/18/2021 12/18/2020  ?WBC 4.0 - 10.5 K/uL 5.3 6.0 4.3  ?Hemoglobin 12.0 - 15.0 g/dL 12.9 13.2 13.4  ?Hematocrit 36.0 - 46.0 % 40.7 41.4 42.1  ?Platelets 150 - 400 K/uL 205 260 -  ? ?BMP Latest Ref Rng & Units 07/17/2021 04/02/2021 01/18/2021  ?Glucose 70 - 99 mg/dL 92 104(H) 143(H)  ?BUN 8 - 23 mg/dL '20 13 21  '$ ?Creatinine 0.44 - 1.00 mg/dL 0.49 0.68 0.61  ?BUN/Creat Ratio 12 - 28 - 19 -  ?Sodium 135 - 145 mmol/L 136 143 136  ?Potassium 3.5 - 5.1 mmol/L 3.7 4.5 3.6  ?Chloride 98 - 111 mmol/L 102 104 105  ?CO2 22 - 32 mmol/L '26 27 23  '$ ?Calcium 8.9 - 10.3 mg/dL 9.5 9.8 9.4  ? ?Intake/Output   ?   03/10 0701 ?03/11 0700 03/11 0701 ?03/12 0700  ? P.O. 220   ? I.V. (mL/kg) 1876.9 (18.4)   ? IV Piggyback 650   ? Total Intake(mL/kg) 2746.9 (26.9)   ? Urine (mL/kg/hr) 850 (0.3)   ? Emesis/NG output 0   ? Blood 350   ? Total Output 1200   ? Net +1546.9   ?     ? Emesis Occurrence 1 x   ?  ? ? ?Physical Exam: ?General: NAD.  Sitting up in bed, eating, calm, comfortable ?Resp: No increased wob ?Cardio: regular rate and rhythm ?ABD soft ?Neurologically intact ?MSK ?Neurovascularly intact ?Sensation intact distally ?Intact pulses distally ?Dorsiflexion/Plantar flexion intact ?Incision: dressing C/D/I ? ? ?Assessment: ?1 Day Post-Op  ?S/P Procedure(s) (LRB): ?TOTAL HIP ARTHROPLASTY (Right) ?by Dr. French Ana on 07/24/21 ? ?Principal Problem: ?  Degenerative  joint disease of right hip ? ? ?Plan: ?Posterior hip precautions  ?Advance diet ?Up with therapy ?Incentive Spirometry ?Elevate and Apply ice ? ?Weightbearing: WBAT RLE ?Insicional and dressing care: Dressings left intact until follow-up and Reinforce dressings as needed ?Orthopedic device(s): None ?Showering: Keep dressing dry ?VTE prophylaxis: Aspirin '81mg'$  BID  x 30 days post-op , SCDs, ambulation ?Pain control: Tylenol, Naproxen, Oxycodone, Tramadol ?Follow - up plan: 2 weeks with Dr. French Ana ? ? ?Dispo: Home tomorrow with HHPT already set up ? ? ? ? ?Britt Bottom, PA-C ?Office [phone] ?07/25/2021, 11:45 AM  ?

## 2021-07-25 NOTE — Evaluation (Signed)
Physical Therapy Evaluation ?Patient Details ?Name: Sandra Lawrence ?MRN: [id number] ?DOB: 11-01-1945 ?Today's Date: 07/25/2021 ? ?History of Present Illness ? 76 yo female s/p R THA-posterior approach 07/24/21. Also found to have a possible nondisplaced GT fx-non op mgmt. Hx of obesity, DM, R TKA 2016, L TKA 2012  ?Clinical Impression ? On eval, pt required Mod A +2 for mobility. She was eventually able to walk about 5 feet across the room. Moderate pain with activity. Family present during session to assist and observe. Will continue to progress activity as tolerated. Pt may need to stay overnight to continue to work with therapy in house to progress activity, improve mobility, and decrease burden of care on family.  ?   ? ?Recommendations for follow up therapy are one component of a multi-disciplinary discharge planning process, led by the attending physician.  Recommendations may be updated based on patient status, additional functional criteria and insurance authorization. ? ?Follow Up Recommendations Follow physician's recommendations for discharge plan and follow up therapies ? ?  ?Assistance Recommended at Discharge Frequent or constant Supervision/Assistance  ?Patient can return home with the following ? A little help with walking and/or transfers;A lot of help with bathing/dressing/bathroom;Assistance with cooking/housework;Assist for transportation;Help with stairs or ramp for entrance ? ?  ?Equipment Recommendations Rolling walker (2 wheels)  ?Recommendations for Other Services ? OT consult  ?  ?Functional Status Assessment Patient has had a recent decline in their functional status and demonstrates the ability to make significant improvements in function in a reasonable and predictable amount of time.  ? ?  ?Precautions / Restrictions Precautions ?Precautions: Fall;Posterior Hip ?Precaution Comments: "limit active hip abduction" 2* possible R hip GT fx ?Restrictions ?Weight Bearing Restrictions: No  ? ?   ? ?Mobility ? Bed Mobility ?Overal bed mobility: Needs Assistance ?Bed Mobility: Supine to Sit ?  ?  ?Supine to sit: HOB elevated, Mod assist, +2 for physical assistance, +2 for safety/equipment ?  ?  ?General bed mobility comments: Assist for trunk and R LE. Increased time. Cues provided. ?  ? ?Transfers ?Overall transfer level: Needs assistance ?Equipment used: Rolling walker (2 wheels) ?Transfers: Sit to/from Stand ?Sit to Stand: Mod assist, +2 physical assistance, +2 safety/equipment, From elevated surface ?  ?  ?  ?  ?  ?General transfer comment: x2. Assist to power up, stabilize, control descent, position R LE. Highly elevated bed and pt using forearms on walker to power up. Increased time. ?  ? ?Ambulation/Gait ?Ambulation/Gait assistance: Min assist, +2 safety/equipment ?Gait Distance (Feet): 5 Feet ?Assistive device: Rolling walker (2 wheels) ?Gait Pattern/deviations: Step-to pattern, Trunk flexed ?  ?  ?  ?General Gait Details: Pre-gait side stepping to L along bedside with RW initially. Pt c/o dizziness so returned to sitting. On 2nd attempt, pt able to walk a short distance across room to recliner. ? ?Stairs ?  ?  ?  ?  ?  ? ?Wheelchair Mobility ?  ? ?Modified Rankin (Stroke Patients Only) ?  ? ?  ? ?Balance Overall balance assessment: Needs assistance ?  ?  ?  ?  ?Standing balance support: Bilateral upper extremity supported, During functional activity, Reliant on assistive device for balance ?Standing balance-Leahy Scale: Poor ?  ?  ?  ?  ?  ?  ?  ?  ?  ?  ?  ?  ?   ? ? ? ?Pertinent Vitals/Pain Pain Assessment ?Pain Assessment: 0-10 ?Pain Score: 7  ?Pain Location: R hip with activity ?Pain Descriptors /  Indicators: Discomfort, Sore, Grimacing, Guarding ?Pain Intervention(s): Limited activity within patient's tolerance, Monitored during session, Repositioned  ? ? ?Home Living Family/patient expects to be discharged to:: Private residence ?Living Arrangements: Alone ?Available Help at Discharge:  Family ?Type of Home: Apartment ?Home Access: Stairs to enter ?Entrance Stairs-Rails: None ?Entrance Stairs-Number of Steps: 1+1+1 ?  ?Home Layout: One level ?Home Equipment: BSC/3in1;Cane - single point (lift chair) ?   ?  ?Prior Function Prior Level of Function : Independent/Modified Independent ?  ?  ?  ?  ?  ?  ?Mobility Comments: using 2 canes for ambulation ?  ?  ? ? ?Hand Dominance  ?   ? ?  ?Extremity/Trunk Assessment  ? Upper Extremity Assessment ?Upper Extremity Assessment: Defer to OT evaluation ?  ? ?Lower Extremity Assessment ?Lower Extremity Assessment: Generalized weakness ?  ? ?Cervical / Trunk Assessment ?Cervical / Trunk Assessment: Normal  ?Communication  ? Communication: No difficulties  ?Cognition Arousal/Alertness: Awake/alert ?Behavior During Therapy: [facility] for tasks assessed/performed ?Overall Cognitive Status: Within Functional Limits for tasks assessed ?  ?  ?  ?  ?  ?  ?  ?  ?  ?  ?  ?  ?  ?  ?  ?  ?General Comments: can be distracted by family at times ?  ?  ? ?  ?General Comments   ? ?  ?Exercises Total Joint Exercises ?Ankle Circles/Pumps: AROM, Both, 10 reps ?Quad Sets: AROM, Both, 10 reps ?Heel Slides: AAROM, Right, 5 reps  ? ?Assessment/Plan  ?  ?PT Assessment Patient needs continued PT services  ?PT Problem List Decreased strength;Decreased mobility;Decreased activity tolerance;Decreased range of motion;Decreased balance;Decreased knowledge of use of DME;Pain;Obesity ? ?   ?  ?PT Treatment Interventions DME instruction;Gait training;Therapeutic exercise;Balance training;Stair training;Functional mobility training;Patient/family education;Therapeutic activities   ? ?PT Goals (Current goals can be found in the Care Plan section)  ?Acute Rehab PT Goals ?Patient Stated Goal: less pain ?PT Goal Formulation: With patient/family ?Time For Goal Achievement: 08/08/21 ?Potential to Achieve Goals: Good ? ?  ?Frequency 7X/week ?  ? ? ?Co-evaluation   ?  ?  ?  ?  ? ? ?  ?AM-PAC PT "6 Clicks"  Mobility  ?Outcome Measure Help needed turning from your back to your side while in a flat bed without using bedrails?: A Lot ?Help needed moving from lying on your back to sitting on the side of a flat bed without using bedrails?: A Lot ?Help needed moving to and from a bed to a chair (including a wheelchair)?: A Lot ?Help needed standing up from a chair using your arms (e.g., wheelchair or bedside chair)?: A Lot ?Help needed to walk in hospital room?: A Lot ?Help needed climbing 3-5 steps with a railing? : A Lot ?6 Click Score: 12 ? ?  ?End of Session Equipment Utilized During Treatment: Gait belt ?Activity Tolerance: Patient limited by pain;Patient limited by fatigue ?Patient left: in chair;with call bell/phone within reach;with family/visitor present;with chair alarm set ?Nurse Communication: Mobility status ?PT Visit Diagnosis: Difficulty in walking, not elsewhere classified (R26.2);Pain;Other abnormalities of gait and mobility (R26.89) ?Pain - Right/Left: Right ?Pain - part of body: Hip ?  ? ?Time: 8546-2703 ?PT Time Calculation (min) (ACUTE ONLY): 54 min ? ? ?Charges:   PT Evaluation ?$PT Eval Low Complexity: 1 Low ?PT Treatments ?$Gait Training: 23-37 mins ?$Therapeutic Exercise: 8-22 mins ?  ?   ? ? ? ? ? ?Doreatha Massed, PT ?Acute Rehabilitation  ?Office: [phone] ?Pager: [phone] ? ?  ? ?

## 2021-07-25 NOTE — TOC Transition Note (Addendum)
Transition of Care (TOC) - CM/SW Discharge Note ? ? ?Patient Details  ?Name: Sandra Lawrence ?MRN: [id number] ?Date of Birth: [birth year] ? ?Transition of Care (TOC) CM/SW Contact:  ?Ross Ludwig, LCSW ?Phone Number: ?07/25/2021, 11:23 AM ? ? ?Clinical Narrative:    ? ?Patient's home health has been prearranged with [place] who will be following patient for [facility] PT. ? ?CSW was informed that patient would like a lift chair, 3 in 1, front rolling walker, and bed rails, CSW spoke to patient's son Marcello Moores and informed him that insurance will not pay for a lift chair, or bed rails, however the 3 in1 and rolling walker can be paid for by insurance.  Patient's son asked how come the lift chair is not covered and CSW informed him it does not fall under Medicare guidelines for necessary equipment.  CSW offered to have [employer] call him and discuss, he said that would be fine.  CSW spoke to [place] at [facility], and she will order deliver the 3 in 1 and rolling walker to room before patient leaves, and forward and email to the correct department to reach out to the son regarding bed rails and lift chair.  CSW signing off, no other TOC needs identified. ? ? ?Final next level of care: [place] ?Barriers to Discharge: Barriers Resolved ? ? ?Patient Goals and CMS Choice ?Patient states their goals for this hospitalization and ongoing recovery are:: To return back home with home health. ?CMS Medicare.gov Compare Post Acute Care list provided to:: Patient Represenative (must comment) ?Choice offered to / list presented to : Adult Children ? ?Discharge Placement ?  ?           ?  ?  ?  ?  ? ?Discharge Plan and Services ?  ?  ?           ?DME Arranged: 3-N-1, Walker rolling ?DME Agency: [employer] ?Date DME Agency Contacted: 07/25/21 ?Time DME Agency Contacted: 0932 ?Representative spoke with at DME Agency: Delana Meyer ?HH Arranged: PT ?[place] Agency: [place] ?Date HH Agency Contacted: 07/20/21 ?Time  [place]: 1123 ?Representative spoke with at [place]: [place] ? ?Social Determinants of Health (SDOH) Interventions ?  ? ? ?Readmission Risk Interventions ?No flowsheet data found. ? ? ? ? ?

## 2021-07-25 NOTE — Evaluation (Signed)
Occupational Therapy Evaluation ?Patient Details ?Name: Sandra Lawrence ?MRN: [id number] ?DOB: 1945-12-21 ?Today's Date: 07/25/2021 ? ? ?History of Present Illness 76 yo female s/p R THA-posterior approach 07/24/21. Also found to have a possible nondisplaced GT fx-non op mgmt. Hx of obesity, DM, R TKA 2016, L TKA 2012  ? ?Clinical Impression ?  ?Sandra Lawrence is a 76 year old woman admitted to hospital with above medical history and presents s/p hip surgery with posterior precautions, decreased ROM and strength of RLE decreased activity tolerance, impaired balance and pain. Patient needing min assist to ambulate with walker and increased assistance with ADLs including max-total assist for LB ADLs and toileting. Patient will benefit from skilled OT services while in hospital to improve deficits and learn compensatory strategies as needed in order to return to PLOF.   ?  ?   ? ?Recommendations for follow up therapy are one component of a multi-disciplinary discharge planning process, led by the attending physician.  Recommendations may be updated based on patient status, additional functional criteria and insurance authorization.  ? ?Follow Up Recommendations ? Follow physician's recommendations for discharge plan and follow up therapies  ?  ?Assistance Recommended at Discharge Frequent or constant Supervision/Assistance  ?Patient can return home with the following A little help with walking and/or transfers;A lot of help with bathing/dressing/bathroom;Assistance with cooking/housework;Help with stairs or ramp for entrance ? ?  ?Functional Status Assessment ? Patient has had a recent decline in their functional status and demonstrates the ability to make significant improvements in function in a reasonable and predictable amount of time.  ?Equipment Recommendations ? None recommended by OT  ?  ?Recommendations for Other Services   ? ? ?  ?Precautions / Restrictions Precautions ?Precautions: Fall;Posterior  Hip ?Precaution Booklet Issued: Yes (comment) ?Precaution Comments: "limit active hip abduction" 2* possibl GT fx ?Restrictions ?Weight Bearing Restrictions: No  ? ?  ? ?Mobility Bed Mobility ?  ?  ?  ?  ?  ?  ?  ?  ?  ? ?Transfers ?  ?  ?  ?  ?  ?  ?  ?  ?  ?  ?  ? ?  ?Balance Overall balance assessment: Needs assistance ?Sitting-balance support: No upper extremity supported, Feet supported ?Sitting balance-Leahy Scale: Good ?  ?  ?Standing balance support: Reliant on assistive device for balance, During functional activity ?Standing balance-Leahy Scale: Poor ?  ?  ?  ?  ?  ?  ?  ?  ?  ?  ?  ?  ?   ? ?ADL either performed or assessed with clinical judgement  ? ?ADL Overall ADL's : Needs assistance/impaired ?Eating/Feeding: Set up;Sitting ?  ?Grooming: Set up;Sitting ?  ?Upper Body Bathing: Set up;Sitting ?  ?Lower Body Bathing: Maximal assistance;Sit to/from stand ?  ?Upper Body Dressing : Set up;Sitting ?  ?Lower Body Dressing: Maximal assistance;Sit to/from stand ?  ?Toilet Transfer: Minimal assistance;BSC/3in1;Rolling walker (2 wheels) ?  ?Toileting- Clothing Manipulation and Hygiene: Maximal assistance;Sit to/from stand ?  ?  ?  ?Functional mobility during ADLs: Minimal assistance;Rolling walker (2 wheels);+2 for physical assistance ?General ADL Comments: Patient min assist - increased time to power up - to stand with RW. Able to ambulate a few feet in the room with RW and verbal cues for instruction.  ? ? ? ?Vision Patient Visual Report: No change from baseline ?   ?   ?Perception   ?  ?Praxis   ?  ? ?Pertinent Vitals/Pain Pain  Assessment ?Pain Assessment: Faces ?Faces Pain Scale: Hurts little more ?Pain Location: R hip with activity ?Pain Descriptors / Indicators: Discomfort, Sore, Grimacing, Guarding ?Pain Intervention(s): Limited activity within patient's tolerance  ? ? ? ?Hand Dominance Right ?  ?Extremity/Trunk Assessment Upper Extremity Assessment ?Upper Extremity Assessment: Overall WFL for tasks  assessed ?  ?Lower Extremity Assessment ?Lower Extremity Assessment: Defer to PT evaluation ?  ?Cervical / Trunk Assessment ?Cervical / Trunk Assessment: Normal ?  ?Communication Communication ?Communication: No difficulties ?  ?Cognition Arousal/Alertness: Awake/alert ?Behavior During Therapy: [facility] for tasks assessed/performed ?Overall Cognitive Status: Within Functional Limits for tasks assessed ?  ?  ?  ?  ?  ?  ?  ?  ?  ?  ?  ?  ?  ?  ?  ?  ?  ?  ?  ?General Comments    ? ?  ?Exercises   ?  ?Shoulder Instructions    ? ? ?Home Living Family/patient expects to be discharged to:: Private residence ?Living Arrangements: Alone ?Available Help at Discharge: Family ?Type of Home: Apartment ?Home Access: Stairs to enter ?Entrance Stairs-Number of Steps: 1+1+1 ?Entrance Stairs-Rails: None ?Home Layout: One level ?  ?  ?Bathroom Shower/Tub: Tub/shower unit ?  ?  ?  ?  ?Home Equipment: BSC/3in1;Cane - single point (lift chair) ?  ?  ?  ? ?  ?Prior Functioning/Environment Prior Level of Function : Independent/Modified Independent ?  ?  ?  ?  ?  ?  ?Mobility Comments: using 2 canes for ambulation ?  ?  ? ?  ?  ?OT Problem List: Decreased strength;Decreased range of motion;Impaired balance (sitting and/or standing);Pain;Decreased activity tolerance;Decreased knowledge of use of DME or AE ?  ?   ?OT Treatment/Interventions: Self-care/ADL training;Therapeutic exercise;DME and/or AE instruction;Therapeutic activities;Balance training;Patient/family education  ?  ?OT Goals(Current goals can be found in the care plan section) Acute Rehab OT Goals ?Patient Stated Goal: walk to bathroom ?OT Goal Formulation: With patient ?Time For Goal Achievement: 08/08/21 ?Potential to Achieve Goals: Good  ?OT Frequency: Min 2X/week ?  ? ?Co-evaluation   ?  ?  ?  ?  ? ?  ?AM-PAC OT "6 Clicks" Daily Activity     ?Outcome Measure Help from another person eating meals?: None ?Help from another person taking care of personal grooming?: A Little ?Help  from another person toileting, which includes using toliet, bedpan, or urinal?: A Lot ?Help from another person bathing (including washing, rinsing, drying)?: A Lot ?Help from another person to put on and taking off regular upper body clothing?: A Little ?  ?6 Click Score: 14 ?  ?End of Session Equipment Utilized During Treatment: Rolling walker (2 wheels);Gait belt ?Nurse Communication: Mobility status ? ?Activity Tolerance: Patient tolerated treatment well ?Patient left: in chair;with call bell/phone within reach;with chair alarm set;with family/visitor present ? ?OT Visit Diagnosis: Other abnormalities of gait and mobility (R26.89)  ?              ?Time: 2336-1224 ?OT Time Calculation (min): 21 min ?Charges:  OT General Charges ?$OT Visit: 1 Visit ?OT Evaluation ?$OT Eval Low Complexity: 1 Low ? ?Deeandra Jerry, OTR/L ?Acute Care Rehab Services  ?Office [phone] ?Pager: [phone]  ? ?Lonney Revak L Kathryn Cosby ?07/25/2021, 1:07 PM ?

## 2021-07-26 DIAGNOSIS — G4733 Obstructive sleep apnea (adult) (pediatric): Secondary | ICD-10-CM | POA: Diagnosis not present

## 2021-07-26 DIAGNOSIS — I5040 Unspecified combined systolic (congestive) and diastolic (congestive) heart failure: Secondary | ICD-10-CM | POA: Diagnosis not present

## 2021-07-26 DIAGNOSIS — Z96651 Presence of right artificial knee joint: Secondary | ICD-10-CM | POA: Diagnosis not present

## 2021-07-26 DIAGNOSIS — M1611 Unilateral primary osteoarthritis, right hip: Secondary | ICD-10-CM | POA: Diagnosis not present

## 2021-07-26 DIAGNOSIS — I2510 Atherosclerotic heart disease of native coronary artery without angina pectoris: Secondary | ICD-10-CM | POA: Diagnosis not present

## 2021-07-26 DIAGNOSIS — E119 Type 2 diabetes mellitus without complications: Secondary | ICD-10-CM | POA: Diagnosis not present

## 2021-07-26 DIAGNOSIS — N189 Chronic kidney disease, unspecified: Secondary | ICD-10-CM | POA: Diagnosis not present

## 2021-07-26 DIAGNOSIS — J45909 Unspecified asthma, uncomplicated: Secondary | ICD-10-CM | POA: Diagnosis not present

## 2021-07-26 DIAGNOSIS — I130 Hypertensive heart and chronic kidney disease with heart failure and stage 1 through stage 4 chronic kidney disease, or unspecified chronic kidney disease: Secondary | ICD-10-CM | POA: Diagnosis not present

## 2021-07-26 LAB — GLUCOSE, CAPILLARY
Glucose-Capillary: 146 mg/dL — ABNORMAL HIGH (ref 70–99)
Glucose-Capillary: 173 mg/dL — ABNORMAL HIGH (ref 70–99)
Glucose-Capillary: 157 mg/dL — ABNORMAL HIGH (ref 70–99)
Glucose-Capillary: 185 mg/dL — ABNORMAL HIGH (ref 70–99)

## 2021-07-26 NOTE — Progress Notes (Signed)
Occupational Therapy Treatment ?Patient Details ?Name: Sandra Lawrence ?MRN: [id number] ?DOB: Nov 07, 1945 ?Today's Date: 07/26/2021 ? ? ?History of present illness 76 yo female s/p R THA-posterior approach 07/24/21. Also found to have a possible nondisplaced GT fx-non op mgmt. Hx of obesity, DM, R TKA 2016, L TKA 2012 ?  ?OT comments ? Treatment focused on functional mobility and ADLs. Patient introduced to AE. Will need reiteration and practice prior to discharge. Limited mobility due to pain. Hasn't been able to demonstrate ambulation to bathroom yet. Continue plan of care.  ? ?Recommendations for follow up therapy are one component of a multi-disciplinary discharge planning process, led by the attending physician.  Recommendations may be updated based on patient status, additional functional criteria and insurance authorization. ?   ?Follow Up Recommendations ? Follow physician's recommendations for discharge plan and follow up therapies  ?  ?Assistance Recommended at Discharge Frequent or constant Supervision/Assistance  ?Patient can return home with the following ? A little help with walking and/or transfers;A lot of help with bathing/dressing/bathroom;Assistance with cooking/housework;Help with stairs or ramp for entrance ?  ?Equipment Recommendations ? None recommended by OT  ?  ?Recommendations for Other Services   ? ?  ?Precautions / Restrictions Precautions ?Precautions: Fall;Posterior Hip ?Precaution Comments: "limit active hip abduction" 2* possibl GT fx ?Restrictions ?Weight Bearing Restrictions: No ?RLE Weight Bearing: Weight bearing as tolerated  ? ? ?  ? ?Mobility Bed Mobility ?  ?  ?  ?  ?  ?  ?  ?  ?  ? ?Transfers ?  ?  ?  ?  ?  ?  ?  ?  ?  ?  ?  ?  ?Balance Overall balance assessment: Needs assistance ?Sitting-balance support: No upper extremity supported ?Sitting balance-Leahy Scale: Good ?  ?  ?Standing balance support: Reliant on assistive device for balance, During functional activity ?  ?  ?  ?  ?   ?  ?  ?  ?  ?  ?  ?  ?  ?   ? ?ADL either performed or assessed with clinical judgement  ? ?ADL Overall ADL's : Needs assistance/impaired ?  ?  ?Grooming: Sitting;Set up;Wash/dry face ?  ?Upper Body Bathing: Set up;Sitting ?Upper Body Bathing Details (indicate cue type and reason): therapist assisted with back ?Lower Body Bathing: Moderate assistance;Sit to/from stand ?Lower Body Bathing Details (indicate cue type and reason): Patient able to assist with partial bath - therapist washing buttocks and patient attending to periarea., ?  ?  ?Lower Body Dressing: Adhering to hip precautions;With adaptive equipment;Minimal assistance ?Lower Body Dressing Details (indicate cue type and reason): Therapist instructed on use of AE for LB dressing. Patient donned and doffed  socks and practiced donning pants in seated position. Did not perform full dressing as patient still needing purewick and not going home. ?  ?  ?  ?  ?  ?  ?Functional mobility during ADLs: +2 for safety/equipment;Min guard;Rolling walker (2 wheels) ?General ADL Comments: Patient able to rise from elevated bed height slowly. Overall min guard for standing and taking steps to recliner. Contnues to be limited by pain. ?  ? ?Extremity/Trunk Assessment   ?  ?  ?  ?  ?  ? ?Vision   ?  ?  ?Perception   ?  ?Praxis   ?  ? ?Cognition Arousal/Alertness: Awake/alert ?Behavior During Therapy: [facility] for tasks assessed/performed ?Overall Cognitive Status: Within Functional Limits for tasks assessed ?  ?  ?  ?  ?  ?  ?  ?  ?  ?  ?  ?  ?  ?  ?  ?  ?  ?  ?  ?   ?  Exercises   ? ?  ?Shoulder Instructions   ? ? ?  ?General Comments    ? ? ?Pertinent Vitals/ Pain       Pain Assessment ?Pain Assessment: 0-10 ?Pain Score: 7  ?Pain Location: R hip with activity ?Pain Descriptors / Indicators: Discomfort, Sore, Grimacing, Guarding ?Pain Intervention(s): Premedicated before session ? ?Home Living   ?  ?  ?  ?  ?  ?  ?  ?  ?  ?  ?  ?  ?  ?  ?  ?  ?  ?  ? ?  ?Prior  Functioning/Environment    ?  ?  ?  ?   ? ?Frequency ? Min 2X/week  ? ? ? ? ?  ?Progress Toward Goals ? ?OT Goals(current goals can now be found in the care plan section) ? Progress towards OT goals: Progressing toward goals ? ?Acute Rehab OT Goals ?Patient Stated Goal: walk to bathroom ?OT Goal Formulation: With patient ?Time For Goal Achievement: 08/08/21 ?Potential to Achieve Goals: Good  ?Plan Discharge plan remains appropriate   ? ?Co-evaluation ? ? ?   ?  ?  ?  ?  ? ?  ?AM-PAC OT "6 Clicks" Daily Activity     ?Outcome Measure ? ? Help from another person eating meals?: None ?Help from another person taking care of personal grooming?: A Little ?Help from another person toileting, which includes using toliet, bedpan, or urinal?: A Lot ?Help from another person bathing (including washing, rinsing, drying)?: A Lot ?Help from another person to put on and taking off regular upper body clothing?: A Little ?Help from another person to put on and taking off regular lower body clothing?: A Lot ?6 Click Score: 16 ? ?  ?End of Session Equipment Utilized During Treatment: Rolling walker (2 wheels);Gait belt ? ?OT Visit Diagnosis: Other abnormalities of gait and mobility (R26.89) ?  ?Activity Tolerance Patient limited by pain ?  ?Patient Left in chair;with call bell/phone within reach;with chair alarm set;with family/visitor present ?  ?Nurse Communication Mobility status ?  ? ?   ? ?Time: 5009-3818 ?OT Time Calculation (min): 32 min ? ?Charges: OT General Charges ?$OT Visit: 1 Visit ?OT Treatments ?$Self Care/Home Management : 23-37 mins ? ?Nicholus Chandran, OTR/L ?Acute Care Rehab Services  ?Office [phone] ?Pager: [phone]  ? ?Dynastee Brummell L Krysten Veronica ?07/26/2021, 10:33 AM ?

## 2021-07-26 NOTE — Progress Notes (Signed)
? ? ?Subjective: ?Patient reports pain as moderate. Better with medicine. Not feeling as well overall today. Tolerating diet but not eating as much. Urinating via purewick. Still hasn't gotten up to use the bathroom toilet. No CP, SOB. Working with PT on mobilizing OOB. She has declined some sessions with them d/t pain or fatigue.  ? ?Objective:  ? ?VITALS:   ?Vitals:  ? 07/25/21 1400 07/25/21 2047 07/26/21 0455 07/26/21 0819  ?BP: (!) 143/54 (!) 161/56 (!) 155/62   ?Pulse: 69 83 77   ?Resp: '16 18 16   '$ ?Temp:  98.3 ?F (36.8 ?C) 98.1 ?F (36.7 ?C)   ?TempSrc:  Oral Oral   ?SpO2: 98% 100% 98% 93%  ?Weight:      ?Height:      ? ?CBC Latest Ref Rng & Units 07/17/2021 01/18/2021 12/18/2020  ?WBC 4.0 - 10.5 K/uL 5.3 6.0 4.3  ?Hemoglobin 12.0 - 15.0 g/dL 12.9 13.2 13.4  ?Hematocrit 36.0 - 46.0 % 40.7 41.4 42.1  ?Platelets 150 - 400 K/uL 205 260 -  ? ?BMP Latest Ref Rng & Units 07/17/2021 04/02/2021 01/18/2021  ?Glucose 70 - 99 mg/dL 92 104(H) 143(H)  ?BUN 8 - 23 mg/dL '20 13 21  '$ ?Creatinine 0.44 - 1.00 mg/dL 0.49 0.68 0.61  ?BUN/Creat Ratio 12 - 28 - 19 -  ?Sodium 135 - 145 mmol/L 136 143 136  ?Potassium 3.5 - 5.1 mmol/L 3.7 4.5 3.6  ?Chloride 98 - 111 mmol/L 102 104 105  ?CO2 22 - 32 mmol/L '26 27 23  '$ ?Calcium 8.9 - 10.3 mg/dL 9.5 9.8 9.4  ? ?Intake/Output   ?   03/11 0701 ?03/12 0700 03/12 0701 ?03/13 0700  ? P.O. 840 240  ? I.V. (mL/kg) 182.4 (1.8)   ? IV Piggyback    ? Total Intake(mL/kg) 1022.4 (10) 240 (2.4)  ? Urine (mL/kg/hr) 450 (0.2) 350 (0.6)  ? Emesis/NG output    ? Blood    ? Total Output 450 350  ? Net +572.4 -110  ?     ?  ? ? ?Physical Exam: ?General: NAD.  Sitting up in bed, looks very fatigued, not as well as yesterday ?Resp: No increased wob ?Cardio: regular rate and rhythm ?ABD soft ?Neurologically intact ?MSK ?Neurovascularly intact ?Sensation intact distally ?Intact pulses distally ?Dorsiflexion/Plantar flexion intact ?Incision: dressing C/D/I ?Barely able to fire her quad muscle to lift her right leg off the  bed ? ? ?Assessment: ?2 Days Post-Op  ?S/P Procedure(s) (LRB): ?TOTAL HIP ARTHROPLASTY (Right) ?by Dr. French Ana on 07/24/21 ? ?Principal Problem: ?  Degenerative joint disease of right hip ? ? ?Plan: ?Posterior hip precautions  ?Encourage patient to push herself more. I feel some of her lack of mobilization is self induced ?Advance diet ?Up with therapy, try to pre-medicate prior to sessions to help her do more ?Incentive Spirometry ?Elevate and Apply ice ? ?Weightbearing: WBAT RLE ?Insicional and dressing care: Dressings left intact until follow-up and Reinforce dressings as needed ?Orthopedic device(s): None ?Showering: Keep dressing dry ?VTE prophylaxis: Aspirin '81mg'$  BID  x 30 days post-op , SCDs, ambulation ?Pain control: Tylenol, Naproxen, Oxycodone, Tramadol ?Follow - up plan: 2 weeks with Dr. French Ana ? ? ?Dispo: Home hopefully tomorrow with HHPT already set up. I once again explained to her why we do not send total joint patients to a rehab facility. I feel she can do better if she has a better mental approach to her recovery. Will need a lot of motivation.  ? ? ? ? ?Sandra Lawrence  Georgianne Fick, PA-C ?Office [phone] ?07/26/2021, 1:07 PM  ?

## 2021-07-26 NOTE — Progress Notes (Signed)
Physical Therapy Treatment ?Patient Details ?Name: Sandra Lawrence ?MRN: [id number] ?DOB: [birth year] ?Today's Date: 07/26/2021 ? ? ?History of Present Illness 76 yo female s/p R THA-posterior approach 07/24/21. Also found to have a possible nondisplaced GT fx-non op mgmt. Hx of obesity, DM, R TKA 2016, L TKA 2012 ? ?  ?PT Comments  ? ? Progressing slowly with mobility. Requires encouragement to progress activity. She walked ~18 feet this morning. Moderate pain with activity. Will plan to have a 2nd session to continue gait training and attempt stair step training. Possible d/c home if family feels comfortable caring for her.  ?  ?Recommendations for follow up therapy are one component of a multi-disciplinary discharge planning process, led by the attending physician.  Recommendations may be updated based on patient status, additional functional criteria and insurance authorization. ? ?Follow Up Recommendations ? Follow physician's recommendations for discharge plan and follow up therapies ?  ?  ?Assistance Recommended at Discharge Frequent or constant Supervision/Assistance  ?Patient can return home with the following A little help with walking and/or transfers;A lot of help with bathing/dressing/bathroom;Assistance with cooking/housework;Assist for transportation;Help with stairs or ramp for entrance ?  ?Equipment Recommendations ?    ?  ?Recommendations for Other Services   ? ? ?  ?Precautions / Restrictions Precautions ?Precautions: Fall;Posterior Hip ?Precaution Booklet Issued: Yes (comment) ?Precaution Comments: "limit active hip abduction" 2* possible GT fx. Pt requires cues for adherence to precautions ?Restrictions ?Weight Bearing Restrictions: No ?RLE Weight Bearing: Weight bearing as tolerated  ?  ? ?Mobility ? Bed Mobility ?  ?  ?  ?  ?  ?  ?  ?General bed mobility comments: oob in recliner ?  ? ?Transfers ?Overall transfer level: Needs assistance ?Equipment used: Rolling walker (2 wheels) ?Transfers: Sit  to/from Stand ?Sit to Stand: Min assist, +2 physical assistance, +2 safety/equipment, From elevated surface ?  ?  ?  ?  ?  ?General transfer comment: Increased time. Assist to power up, stabilize, control descent. Cues for safety, technique, hand/LE placement. ?  ? ?Ambulation/Gait ?Ambulation/Gait assistance: Min assist, +2 safety/equipment ?Gait Distance (Feet): 18 Feet ?Assistive device: Rolling walker (2 wheels) ?Gait Pattern/deviations: Step-to pattern, Trunk flexed ?  ?  ?  ?General Gait Details: Cues for safety, technique,sequence, posture, RW proximity. Assist to stabilize throughout distance. Followed with recliner and used it to transport pt back to room. Dyspnea 2/4. O2 98% on RA ? ? ?Stairs ?  ?  ?  ?  ?  ? ? ?Wheelchair Mobility ?  ? ?Modified Rankin (Stroke Patients Only) ?  ? ? ?  ?Balance Overall balance assessment: Needs assistance ?  ?  ?  ?  ?Standing balance support: Reliant on assistive device for balance, During functional activity ?Standing balance-Leahy Scale: Poor ?  ?  ?  ?  ?  ?  ?  ?  ?  ?  ?  ?  ?  ? ?  ?Cognition Arousal/Alertness: Awake/alert ?Behavior During Therapy: [facility] for tasks assessed/performed ?Overall Cognitive Status: Within Functional Limits for tasks assessed ?  ?  ?  ?  ?  ?  ?  ?  ?  ?  ?  ?  ?  ?  ?  ?  ?General Comments: requires encouragement ?  ?  ? ?  ?Exercises   ? ?  ?General Comments   ?  ?  ? ?Pertinent Vitals/Pain Pain Assessment ?Pain Assessment: Faces ?Faces Pain Scale: Hurts even more ?Pain Location: R hip with  activity ?Pain Descriptors / Indicators: Discomfort, Sore, Grimacing, Guarding ?Pain Intervention(s): Limited activity within patient's tolerance, Monitored during session, Repositioned, Ice applied  ? ? ?Home Living   ?  ?  ?  ?  ?  ?  ?  ?  ?  ?   ?  ?Prior Function    ?  ?  ?   ? ?PT Goals (current goals can now be found in the care plan section) Acute Rehab PT Goals ?Patient Stated Goal: less pain ?Progress towards PT goals: Progressing toward  goals ? ?  ?Frequency ? ? ? 7X/week ? ? ? ?  ?PT Plan Current plan remains appropriate  ? ? ?Co-evaluation   ?  ?  ?  ?  ? ?  ?AM-PAC PT "6 Clicks" Mobility   ?Outcome Measure ? Help needed turning from your back to your side while in a flat bed without using bedrails?: A Lot ?Help needed moving from lying on your back to sitting on the side of a flat bed without using bedrails?: A Lot ?Help needed moving to and from a bed to a chair (including a wheelchair)?: A Lot ?Help needed standing up from a chair using your arms (e.g., wheelchair or bedside chair)?: A Lot ?Help needed to walk in hospital room?: A Lot ?Help needed climbing 3-5 steps with a railing? : A Lot ?6 Click Score: 12 ? ?  ?End of Session Equipment Utilized During Treatment: Gait belt ?Activity Tolerance: Patient limited by pain;Patient limited by fatigue ?Patient left: in chair;with call bell/phone within reach;with chair alarm set ?Nurse Communication: Mobility status ?PT Visit Diagnosis: Difficulty in walking, not elsewhere classified (R26.2);Pain;Other abnormalities of gait and mobility (R26.89) ?Pain - Right/Left: Right ?Pain - part of body: Hip ?  ? ? ?Time: 1505-6979 ?PT Time Calculation (min) (ACUTE ONLY): 13 min ? ?Charges:  $Gait Training: 8-22 mins          ?          ? ?Doreatha Massed, PT ?Acute Rehabilitation  ?Office: [phone] ?Pager: [phone] ? ? ? ?  ? ?

## 2021-07-26 NOTE — Progress Notes (Addendum)
Physical Therapy Treatment ?Patient Details ?Name: Sandra Lawrence ?MRN: [id number] ?DOB: 07-16-1945 ?Today's Date: 07/26/2021 ? ? ?History of Present Illness 76 yo female s/p R THA-posterior approach 07/24/21. Also found to have a possible nondisplaced GT fx-non op mgmt. Hx of obesity, DM, R TKA 2016, L TKA 2012 ? ?  ?PT Comments  ? ? Progressing with mobility. Moderate pain with activity. Pt fatigues fairly easily with activity. Per discussion with family, pt needs to be able to walk ~50 feet and ascend/descend 1 step x 3 in one trip (about 10 feet between each step). Will continue to work on progression of activity. Encouraged pt to try to get up and walk to/from bathroom with nursing more during the day (instead of using purewick).  ? ?  ?Recommendations for follow up therapy are one component of a multi-disciplinary discharge planning process, led by the attending physician.  Recommendations may be updated based on patient status, additional functional criteria and insurance authorization. ? ?Follow Up Recommendations ? Follow physician's recommendations for discharge plan and follow up therapies (plan is for HHPT) ?  ?  ?Assistance Recommended at Discharge Frequent or constant Supervision/Assistance  ?Patient can return home with the following Assistance with cooking/housework;Assist for transportation;Help with stairs or ramp for entrance;A little help with bathing/dressing/bathroom;A little help with walking and/or transfers ?  ?Equipment Recommendations ?    ?  ?Recommendations for Other Services OT consult ? ? ?  ?Precautions / Restrictions Precautions ?Precautions: Fall;Posterior Hip ?Precaution Booklet Issued: Yes (comment) ?Precaution Comments: "limit active hip abduction" 2* possible GT fx. Pt requires cues for adherence to precautions ?Restrictions ?Weight Bearing Restrictions: No ?RLE Weight Bearing: Weight bearing as tolerated  ?  ? ?Mobility ? Bed Mobility ?Overal bed mobility: Needs Assistance ?Bed  Mobility: Supine to Sit, Sit to Supine ?  ?  ?Supine to sit: HOB elevated, Mod assist, +2 for physical assistance, +2 for safety/equipment ?Sit to supine: Mod assist, +2 for physical assistance, +2 for safety/equipment, HOB elevated ?  ?General bed mobility comments: +2 assist for bed mobility. Increased time. (does not need to master this-will sleep in lift recliner). Exited on L and entered on R.  ?  ? ?Transfers ?Overall transfer level: Needs assistance ?Equipment used: Rolling walker (2 wheels) ?Transfers: Sit to/from Stand ?Sit to Stand: Min assist, +2 physical assistance, +2 safety/equipment, From elevated surface ?  ?  ?  ?  ?  ?General transfer comment: Increased time. Assist to power up, stabilize, control descent. Cues for safety, technique, hand/LE placement. ?  ? ?Ambulation/Gait ?Ambulation/Gait assistance: Min assist, +2 safety/equipment ?Gait Distance (Feet): 25 Feet (25'x1' 15'x1) ?Assistive device: Rolling walker (2 wheels) ?Gait Pattern/deviations: Step-to pattern, Trunk flexed ?  ?  ?  ?General Gait Details: Cues for safety, technique,sequence, posture, RW proximity. Assist to stabilize throughout distance. Followed with recliner and used it to transport pt back to room. Dyspnea 2/4. Several standing rest breaks. Pt also c/o some lightheadedness. ? ? ?Stairs ?Stairs: Yes ?Stairs assistance: Mod assist, +2 safety/equipment ?Stair Management: Forwards ?Number of Stairs: 1 ?General stair comments: Assist to stabilize pt and manage RW. Increased time and effort. +2 for safety. Cues for safety, technique, sequence. ? ? ?Wheelchair Mobility ?  ? ?Modified Rankin (Stroke Patients Only) ?  ? ? ?  ?Balance Overall balance assessment: Needs assistance ?  ?  ?  ?  ?Standing balance support: Reliant on assistive device for balance, During functional activity, Bilateral upper extremity supported ?Standing balance-Leahy Scale: Poor ?  ?  ?  ?  ?  ?  ?  ?  ?  ?  ?  ?  ?  ? ?  ?  Cognition Arousal/Alertness:  Awake/alert ?Behavior During Therapy: [facility] for tasks assessed/performed ?Overall Cognitive Status: Within Functional Limits for tasks assessed ?  ?  ?  ?  ?  ?  ?  ?  ?  ?  ?  ?  ?  ?  ?  ?  ?General Comments: requires encouragement ?  ?  ? ?  ?Exercises   ? ?  ?General Comments   ?  ?  ? ?Pertinent Vitals/Pain Pain Assessment ?Pain Assessment: Faces ?Faces Pain Scale: Hurts even more ?Pain Location: R hip with activity ?Pain Descriptors / Indicators: Discomfort, Sore, Grimacing, Guarding ?Pain Intervention(s): Limited activity within patient's tolerance, Monitored during session, Repositioned, Ice applied  ? ? ?Home Living   ?  ?  ?  ?  ?  ?  ?  ?  ?  ?   ?  ?Prior Function    ?  ?  ?   ? ?PT Goals (current goals can now be found in the care plan section) Progress towards PT goals: Progressing toward goals ? ?  ?Frequency ? ? ? 7X/week ? ? ? ?  ?PT Plan Current plan remains appropriate  ? ? ?Co-evaluation   ?  ?  ?  ?  ? ?  ?AM-PAC PT "6 Clicks" Mobility   ?Outcome Measure ? Help needed turning from your back to your side while in a flat bed without using bedrails?: A Lot ?Help needed moving from lying on your back to sitting on the side of a flat bed without using bedrails?: A Lot ?Help needed moving to and from a bed to a chair (including a wheelchair)?: A Little ?Help needed standing up from a chair using your arms (e.g., wheelchair or bedside chair)?: A Lot ?Help needed to walk in hospital room?: A Little ?Help needed climbing 3-5 steps with a railing? : A Lot ?6 Click Score: 14 ? ?  ?End of Session Equipment Utilized During Treatment: Gait belt ?Activity Tolerance: Patient limited by pain;Patient limited by fatigue ?Patient left: in bed;with call bell/phone within reach;with family/visitor present ?  ?PT Visit Diagnosis: Difficulty in walking, not elsewhere classified (R26.2);Pain;Other abnormalities of gait and mobility (R26.89) ?Pain - Right/Left: Right ?Pain - part of body: Hip ?  ? ? ?Time: 7564-3329 ?PT  Time Calculation (min) (ACUTE ONLY): 38 min ? ?Charges:  $Gait Training: 38-52 mins          ?          ? ? ? ? ?Doreatha Massed, PT ?Acute Rehabilitation  ?Office: [phone] ?Pager: [phone] ? ?  ? ?

## 2021-07-26 NOTE — Plan of Care (Signed)
  Problem: Activity: Goal: Ability to avoid complications of mobility impairment will improve Outcome: Progressing   Problem: Clinical Measurements: Goal: Postoperative complications will be avoided or minimized Outcome: Progressing   Problem: Pain Management: Goal: Pain level will decrease with appropriate interventions Outcome: Progressing   

## 2021-07-27 ENCOUNTER — Encounter (HOSPITAL_COMMUNITY): Payer: Self-pay | Admitting: Orthopedic Surgery

## 2021-07-27 DIAGNOSIS — I5040 Unspecified combined systolic (congestive) and diastolic (congestive) heart failure: Secondary | ICD-10-CM | POA: Diagnosis not present

## 2021-07-27 DIAGNOSIS — J45909 Unspecified asthma, uncomplicated: Secondary | ICD-10-CM | POA: Diagnosis not present

## 2021-07-27 DIAGNOSIS — Z96651 Presence of right artificial knee joint: Secondary | ICD-10-CM | POA: Diagnosis not present

## 2021-07-27 DIAGNOSIS — M1611 Unilateral primary osteoarthritis, right hip: Secondary | ICD-10-CM | POA: Diagnosis not present

## 2021-07-27 DIAGNOSIS — N189 Chronic kidney disease, unspecified: Secondary | ICD-10-CM | POA: Diagnosis not present

## 2021-07-27 DIAGNOSIS — G4733 Obstructive sleep apnea (adult) (pediatric): Secondary | ICD-10-CM | POA: Diagnosis not present

## 2021-07-27 DIAGNOSIS — I2510 Atherosclerotic heart disease of native coronary artery without angina pectoris: Secondary | ICD-10-CM | POA: Diagnosis not present

## 2021-07-27 DIAGNOSIS — I130 Hypertensive heart and chronic kidney disease with heart failure and stage 1 through stage 4 chronic kidney disease, or unspecified chronic kidney disease: Secondary | ICD-10-CM | POA: Diagnosis not present

## 2021-07-27 DIAGNOSIS — E119 Type 2 diabetes mellitus without complications: Secondary | ICD-10-CM | POA: Diagnosis not present

## 2021-07-27 LAB — GLUCOSE, CAPILLARY: Glucose-Capillary: 124 mg/dL — ABNORMAL HIGH (ref 70–99)

## 2021-07-27 NOTE — Progress Notes (Signed)
Reviewed all d/c instructions w/ patient and patient's son. Both verbalized understanding of instructions. Will d/c home w/ instructions, belongings, equipment.  ?
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
## 2021-07-27 NOTE — Plan of Care (Signed)
  Problem: Coping: Goal: Level of anxiety will decrease Outcome: Progressing   Problem: Pain Managment: Goal: General experience of comfort will improve Outcome: Progressing   

## 2021-07-27 NOTE — Plan of Care (Signed)
?  Problem: Pain Management: ?Goal: Pain level will decrease with appropriate interventions ?07/27/2021 0127 by Blase Mess, RN ?Outcome: Progressing ?07/27/2021 0126 by Blase Mess, RN ?Outcome: Progressing ?  ?

## 2021-07-27 NOTE — Progress Notes (Signed)
Occupational Therapy Treatment and Discharge ?Patient Details ?Name: Sandra Lawrence ?MRN: [id number] ?DOB: May 22, 1945 ?Today's Date: 07/27/2021 ? ? ?History of present illness 76 yo female s/p R THA-posterior approach 07/24/21. Also found to have a possible nondisplaced GT fx-non op mgmt. Hx of obesity, DM, R TKA 2016, L TKA 2012 ?  ?OT comments ? This 76 yo female seen today to continue to go over use of AE for LBD and discuss AE/DME. Pt did well with AE today for LBD. Pt/family will discuss with HH therapist(s) tub bench and appropriateness in her bathroom. Son will look into buying her a wide 3n1. No further acute OT needs, we will sign off.  ? ?Recommendations for follow up therapy are one component of a multi-disciplinary discharge planning process, led by the attending physician.  Recommendations may be updated based on patient status, additional functional criteria and insurance authorization. ?   ?Follow Up Recommendations ? Home health OT  ?  ?Assistance Recommended at Discharge Frequent or constant Supervision/Assistance  ?Patient can return home with the following ? A little help with walking and/or transfers;A little help with bathing/dressing/bathroom;Help with stairs or ramp for entrance;Assist for transportation;Assistance with cooking/housework ?  ?Equipment Recommendations ?  (wide BSC)  ?  ?   ?Precautions / Restrictions Precautions ?Precautions: Fall;Posterior Hip ?Precaution Comments: "limit active hip abduction" 2* possible GT fx. Per PA (Josh Chadwell, the literature now says there is not any need for posterior hip precautions). ?Restrictions ?Weight Bearing Restrictions: No ?RLE Weight Bearing: Weight bearing as tolerated  ? ? ?  ? ?Mobility Bed Mobility ?  ?  ?  ?  ?  ?  ?  ?General bed mobility comments: Pt up in recliner upon arrival ?  ? ?Transfers ?Overall transfer level: Needs assistance ?Equipment used: Rolling walker (2 wheels) ?Transfers: Sit to/from Stand ?Sit to Stand: Min guard ?  ?   ?  ?  ?  ?General transfer comment: Increased time to power up, stabilize, control descent. ?  ?  ?Balance Overall balance assessment: Needs assistance ?Sitting-balance support: No upper extremity supported, Feet supported ?Sitting balance-Leahy Scale: Good ?  ?  ?Standing balance support: No upper extremity supported, During functional activity ?Standing balance-Leahy Scale: Fair ?  ?  ?  ?  ?  ?  ?  ?  ?  ?  ?  ?  ?   ? ?ADL either performed or assessed with clinical judgement  ? ?ADL Overall ADL's : Needs assistance/impaired ?  ?  ?  ?  ?  ?  ?  ?  ?  ?  ?Lower Body Dressing: Set up;Supervision/safety;Adhering to hip precautions;With adaptive equipment ?Lower Body Dressing Details (indicate cue type and reason): min guard A sit<>stand; use of sock aid to donn socks, use of reacher to doff socks, donn pants, doff pants. Pt has a 24" long handled shoe horn at home to use as well as a reacher and sockaid---that her son order all of her. ?  ?Armed forces technical officer Details (indicate cue type and reason): Pt's insurance only paid for a standard 3n1 for pt, son is looking to get her a wide one. ?  ?  ?  ?Tub/Shower Transfer Details (indicate cue type and reason): I showed a picture of tub bench seated in a tub to patient and son and explained how it would be used at home. I advised them to ask their HHPT/OT that they wanted to practice a dry run with one in her own tub to  see if it will work for her. ?  ?  ?  ? ?Extremity/Trunk Assessment Upper Extremity Assessment ?Upper Extremity Assessment: Overall WFL for tasks assessed ?  ?  ?  ?  ?  ? ?Vision Patient Visual Report: No change from baseline ?  ?  ?   ?   ? ?Cognition Arousal/Alertness: Awake/alert ?Behavior During Therapy: [facility] for tasks assessed/performed ?Overall Cognitive Status: Within Functional Limits for tasks assessed ?  ?  ?  ?  ?  ?  ?  ?  ?  ?  ?  ?  ?  ?  ?  ?  ?  ?  ?  ?   ?   ?   ?   ? ? ?Pertinent Vitals/ Pain       Pain Assessment ?Pain Assessment:  0-10 ?Pain Score: 4  ?Pain Location: R hip with activity ?Pain Descriptors / Indicators: Discomfort, Sore, Grimacing, Guarding ?Pain Intervention(s): Limited activity within patient's tolerance, Monitored during session, Repositioned, Ice applied ? ?   ?   ? ?Frequency ? Min 2X/week  ? ? ? ? ?  ?Progress Toward Goals ? ?OT Goals(current goals can now be found in the care plan section) ? Progress towards OT goals: Progressing toward goals ? ?Acute Rehab OT Goals ?Patient Stated Goal: to go home today ?OT Goal Formulation: With patient/family ?Time For Goal Achievement: 08/08/21 ?Potential to Achieve Goals: Good  ?Plan Discharge plan needs to be updated   ? ?   ?AM-PAC OT "6 Clicks" Daily Activity     ?Outcome Measure ? ? Help from another person eating meals?: None ?Help from another person taking care of personal grooming?: A Little ?Help from another person toileting, which includes using toliet, bedpan, or urinal?: A Little ?Help from another person bathing (including washing, rinsing, drying)?: A Little ?Help from another person to put on and taking off regular upper body clothing?: A Little ?Help from another person to put on and taking off regular lower body clothing?: A Little ?6 Click Score: 19 ? ?  ?End of Session Equipment Utilized During Treatment: Rolling walker (2 wheels) ? ?OT Visit Diagnosis: Other abnormalities of gait and mobility (R26.89);Pain ?Pain - Right/Left: Right ?Pain - part of body: Hip ?  ?Activity Tolerance Patient tolerated treatment well ?  ?Patient Left in chair;with call bell/phone within reach;with family/visitor present ?  ?   ? ?   ? ?Time: 7902-4097 ?OT Time Calculation (min): 45 min ? ?Charges: OT General Charges ?$OT Visit: 1 Visit ?OT Treatments ?$Self Care/Home Management : 38-52 mins ? ?[place], OTR/L ?Acute Rehab Services ?Pager [phone] ?Office [phone] ? ? ? ?Almon Register ?07/27/2021, 1:10 PM ?

## 2021-07-27 NOTE — Progress Notes (Signed)
Physical Therapy Treatment ?Patient Details ?Name: Sandra Lawrence ?MRN: [id number] ?DOB: 08/15/1945 ?Today's Date: 07/27/2021 ? ? ?History of Present Illness 76 yo female s/p R THA-posterior approach 07/24/21. Also found to have a possible nondisplaced GT fx-non op mgmt. Hx of obesity, DM, R TKA 2016, L TKA 2012 ? ?  ?PT Comments  ? ? POD # 3 am session ?General Comments: AxO x 3 feeling better and motivated today.  Son present during session and very helpful/encouraging.  General bed mobility comments: demonstarted and educated on use of strap to self guide R LE OOB.  Pt self able with increased time and 25% VC's on proper tech.General transfer comment: 50% VC's on proper tech and increased time to rise from elevated bed and also assisted with a toilet transfer.General Gait Details: increased time but able to amb to and from bathroom then in hallway.  Son present and "hands on" assisted.  No dyspnea present this session.  No dizziness.  Tolerated a functional distance in hallway.  Advised Son to have a kitchen chair with arms at standby to use in case pt is too tired to amb from car to haouse.  Pt can have a seated rest break.  Pt has to be able to amb 60 feet plus go up 3 separated one steps to get into her house.General stair comments: with Son "hands on" asissted pt going up and down "one step" with 50% VC's on proper walker placement and sequencing. Then returned to room to perform some TE's following HEP handout.  Instructed on proper tech, freq as well as use of ICE.   ?Addressed all mobility questions, discussed appropriate activity, educated on use of ICE.  Pt ready for D/C to home. ?  ?Recommendations for follow up therapy are one component of a multi-disciplinary discharge planning process, led by the attending physician.  Recommendations may be updated based on patient status, additional functional criteria and insurance authorization. ? ?Follow Up Recommendations ? Home health PT ?  ?  ?Assistance  Recommended at Discharge Frequent or constant Supervision/Assistance  ?Patient can return home with the following Assistance with cooking/housework;Assist for transportation;Help with stairs or ramp for entrance;A little help with bathing/dressing/bathroom;A little help with walking and/or transfers ?  ?Equipment Recommendations ? Rolling walker (2 wheels)  ?  ?Recommendations for Other Services   ? ? ?  ?Precautions / Restrictions Precautions ?Precautions: Fall;Posterior Hip ?Precaution Booklet Issued: Yes (comment) ?Precaution Comments: "limit active hip abduction" 2* possible GT fx. Per PA (Josh Chadwell, the literature now says there is not any need for posterior hip precautions). ?Restrictions ?Weight Bearing Restrictions: No ?RLE Weight Bearing: Weight bearing as tolerated  ?  ? ?Mobility ? Bed Mobility ?Overal bed mobility: Needs Assistance ?Bed Mobility: Supine to Sit ?  ?  ?Supine to sit: Min guard, Supervision ?Sit to supine: Min guard, Supervision ?  ?General bed mobility comments: demonstarted and educated on use of strap to self guide R LE OOB.  Pt self able with increased time and 25% VC's on proper tech. ?  ? ?Transfers ?Overall transfer level: Needs assistance ?Equipment used: Rolling walker (2 wheels) ?Transfers: Sit to/from Stand ?Sit to Stand: Supervision, Min guard ?  ?  ?  ?  ?  ?General transfer comment: 50% VC's on proper tech and increased time to rise from elevated bed and also assisted with a toilet transfer. ?  ? ?Ambulation/Gait ?Ambulation/Gait assistance: Supervision, Min guard ?Gait Distance (Feet): 38 Feet ?Assistive device: Rolling walker (2 wheels) ?Gait  Pattern/deviations: Step-to pattern, Decreased stance time - right ?Gait velocity: decreased. ?  ?  ?General Gait Details: increased time but able to amb to and from bathroom then in hallway.  Son present and "hands on" assisted.  No dyspnea present this session.  No dizziness.  Tolerated a functional distance in hallway.  Advised  Son to have a kitchen chair with arms at standby to use in case pt is too tired to amb from car to haouse.  Pt can have a seated rest break.  Pt has to be able to amb 60 feet plus go up 3 separated one steps to get into her house. ? ? ?Stairs ?Stairs: Yes ?Stairs assistance: Min guard, Supervision ?Stair Management: Forwards, With walker, Step to pattern ?Number of Stairs: 1 ?General stair comments: with Son "hands on" asissted pt going up and down "one step" with 50% VC's on proper walker placement and sequencing. ? ? ?Wheelchair Mobility ?  ? ?Modified Rankin (Stroke Patients Only) ?  ? ? ?  ?Balance   ?  ?  ?  ?  ?  ?  ?  ?  ?  ?  ?  ?  ?  ?  ?  ?  ?  ?  ?  ? ?  ?Cognition Arousal/Alertness: Awake/alert ?Behavior During Therapy: [facility] for tasks assessed/performed ?Overall Cognitive Status: Within Functional Limits for tasks assessed ?  ?  ?  ?  ?  ?  ?  ?  ?  ?  ?  ?  ?  ?  ?  ?  ?General Comments: AxO x 3 feeling better and motivated today.  Son present during session and very helpful/encouraging. ?  ?  ? ?  ?Exercises  Total Hip Replacement TE's following HEP Handout ?10 reps ankle pumps ?05 reps knee presses ?05 reps heel slides ?05 reps SAQ's ?05 reps ABD ?Instructed how to use a belt loop to assist  ?Followed by ICE ? ? ?  ?General Comments   ?  ?  ? ?Pertinent Vitals/Pain Pain Assessment ?Pain Assessment: 0-10 ?Pain Score: 6  ?Pain Location: R hip with activity ?Pain Descriptors / Indicators: Discomfort, Sore, Grimacing, Guarding ?Pain Intervention(s): Monitored during session, Premedicated before session, Repositioned, Ice applied  ? ? ?Home Living   ?  ?  ?  ?  ?  ?  ?  ?  ?  ?   ?  ?Prior Function    ?  ?  ?   ? ?PT Goals (current goals can now be found in the care plan section) Progress towards PT goals: Progressing toward goals ? ?  ?Frequency ? ? ? 7X/week ? ? ? ?  ?PT Plan Current plan remains appropriate  ? ? ?Co-evaluation   ?  ?  ?  ?  ? ?  ?AM-PAC PT "6 Clicks" Mobility   ?Outcome Measure ? Help  needed turning from your back to your side while in a flat bed without using bedrails?: A Little ?Help needed moving from lying on your back to sitting on the side of a flat bed without using bedrails?: A Little ?Help needed moving to and from a bed to a chair (including a wheelchair)?: A Little ?Help needed standing up from a chair using your arms (e.g., wheelchair or bedside chair)?: A Little ?Help needed to walk in hospital room?: A Little ?Help needed climbing 3-5 steps with a railing? : A Little ?6 Click Score: 18 ? ?  ?End of Session Equipment Utilized  During Treatment: Gait belt ?Activity Tolerance: Patient limited by fatigue ?Patient left: in chair;with call bell/phone within reach ?Nurse Communication: Mobility status ?PT Visit Diagnosis: Difficulty in walking, not elsewhere classified (R26.2);Pain;Other abnormalities of gait and mobility (R26.89) ?Pain - Right/Left: Right ?Pain - part of body: Hip ?  ? ? ?Time: 3244-0102 ?PT Time Calculation (min) (ACUTE ONLY): 45 min ? ?Charges:  $Gait Training: 8-22 mins ?$Therapeutic Exercise: 8-22 mins ?$Therapeutic Activity: 8-22 mins          ?          ? ?Rica Koyanagi  PTA ?Acute  Rehabilitation Services ?Pager      [phone] ?Office      [phone] ? ?

## 2021-07-30 ENCOUNTER — Other Ambulatory Visit (INDEPENDENT_AMBULATORY_CARE_PROVIDER_SITE_OTHER): Payer: Self-pay | Admitting: Family Medicine

## 2021-07-30 NOTE — Telephone Encounter (Signed)
Dr.Opalski ?

## 2021-08-03 DIAGNOSIS — M1611 Unilateral primary osteoarthritis, right hip: Secondary | ICD-10-CM | POA: Diagnosis not present

## 2021-09-03 ENCOUNTER — Telehealth: Payer: Self-pay

## 2021-09-03 NOTE — Telephone Encounter (Signed)
Pt. Called stating that she wanted to now if you could call her in something different for overactive bladder. The one you called in for her last year is not working. She is going through 3-4 diapers a day.  ?

## 2021-09-08 ENCOUNTER — Encounter: Payer: Self-pay | Admitting: Family Medicine

## 2021-09-08 ENCOUNTER — Telehealth: Payer: Medicare PPO | Admitting: Family Medicine

## 2021-09-08 VITALS — Temp 98.3°F | Wt 235.0 lb

## 2021-09-08 DIAGNOSIS — N3281 Overactive bladder: Secondary | ICD-10-CM

## 2021-09-08 MED ORDER — SOLIFENACIN SUCCINATE 5 MG PO TABS: 5.0000 mg | ORAL_TABLET | Freq: Every day | ORAL | 1 refills | Status: DC

## 2021-09-08 NOTE — Progress Notes (Signed)
? ?  Subjective:  ? ? Patient ID: Sandra Lawrence, female    DOB: 01/27/1946, 76 y.o.   MRN: [id number] ? ?HPI ?Documentation for virtual audio and video telecommunications through [place] encounter: ?The patient was located at home. 2 patient identifiers used.  ?The provider was located in the office. ?The patient did consent to this visit and is aware of possible charges through their insurance for this visit. ?The other persons participating in this telemedicine service were none. ?Time spent on call was 5 minutes and in review of previous records >20 minutes total for counseling and coordination of care. ?This virtual service is not related to other E/M service within previous 7 days.  ?She does have a history of OAB and in the past had used Detrol without much success and then switched to Myrbetriq which worked briefly but then even the 50 mg dosing of that apparently was not effective.  She would like to try different medication. ? ?Review of Systems ? ?   ?Objective:  ? Physical Exam ?Alert and in no distress otherwise not examined ? ? ? ?   ?Assessment & Plan:  ?OAB (overactive bladder) - Plan: solifenacin (VESICARE) 5 MG tablet ?Since she has tried Geologist, engineering I will try her on a different medication from the same class.  She will start with the 5 mg dose for a week or 2 and then may increase it to 10 mg.  If she continues have difficulty I will refer her to urology for further care on this. ? ?

## 2021-09-29 ENCOUNTER — Other Ambulatory Visit: Payer: Self-pay

## 2021-09-29 NOTE — Patient Outreach (Signed)
[facility]) Care Management ? ?09/29/2021 ? ?[name] ?December 22, 1945 ?[id number] ? ? ?Telephone call to patient for disease management follow up.   No answer.  HIPAA compliant voice message left.   ? ?Plan: If no return call, RN CM will attempt patient again in August.  ? ?[name], RN, MSN ?[facility] Care Management ?Care Management Coordinator ?Direct Line [phone] ?Toll Free: [phone]  ?Fax: [phone] ? ?

## 2021-09-30 ENCOUNTER — Telehealth: Payer: Self-pay | Admitting: Family Medicine

## 2021-09-30 MED ORDER — SOLIFENACIN SUCCINATE 10 MG PO TABS: 10.0000 mg | ORAL_TABLET | Freq: Every day | ORAL | 1 refills | Status: DC

## 2021-09-30 NOTE — Telephone Encounter (Signed)
Pt left voicemail about rx you prescribed for OAB. One pill was not working so she went up 2 pills and that worked ?She needs rx sent in with next directions and quantity for 2 pills ?

## 2021-10-01 ENCOUNTER — Other Ambulatory Visit: Payer: Self-pay | Admitting: Family Medicine

## 2021-10-01 DIAGNOSIS — N3281 Overactive bladder: Secondary | ICD-10-CM

## 2021-10-07 ENCOUNTER — Other Ambulatory Visit (INDEPENDENT_AMBULATORY_CARE_PROVIDER_SITE_OTHER): Payer: Self-pay | Admitting: Family Medicine

## 2021-10-07 DIAGNOSIS — E559 Vitamin D deficiency, unspecified: Secondary | ICD-10-CM

## 2021-10-08 ENCOUNTER — Ambulatory Visit (INDEPENDENT_AMBULATORY_CARE_PROVIDER_SITE_OTHER): Payer: Medicare PPO | Admitting: Family Medicine

## 2021-10-08 ENCOUNTER — Encounter (INDEPENDENT_AMBULATORY_CARE_PROVIDER_SITE_OTHER): Payer: Self-pay | Admitting: Family Medicine

## 2021-10-08 VITALS — BP 176/90 | HR 56 | Temp 98.0°F | Ht 67.0 in | Wt 250.0 lb

## 2021-10-08 DIAGNOSIS — E1159 Type 2 diabetes mellitus with other circulatory complications: Secondary | ICD-10-CM | POA: Diagnosis not present

## 2021-10-08 DIAGNOSIS — E559 Vitamin D deficiency, unspecified: Secondary | ICD-10-CM

## 2021-10-08 DIAGNOSIS — Z96641 Presence of right artificial hip joint: Secondary | ICD-10-CM | POA: Diagnosis not present

## 2021-10-08 DIAGNOSIS — I152 Hypertension secondary to endocrine disorders: Secondary | ICD-10-CM

## 2021-10-08 DIAGNOSIS — E669 Obesity, unspecified: Secondary | ICD-10-CM | POA: Diagnosis not present

## 2021-10-08 DIAGNOSIS — Z6839 Body mass index (BMI) 39.0-39.9, adult: Secondary | ICD-10-CM

## 2021-10-08 DIAGNOSIS — E782 Mixed hyperlipidemia: Secondary | ICD-10-CM | POA: Diagnosis not present

## 2021-10-08 DIAGNOSIS — E118 Type 2 diabetes mellitus with unspecified complications: Secondary | ICD-10-CM

## 2021-10-08 DIAGNOSIS — E1169 Type 2 diabetes mellitus with other specified complication: Secondary | ICD-10-CM | POA: Diagnosis not present

## 2021-10-08 MED ORDER — VITAMIN D (ERGOCALCIFEROL) 1.25 MG (50000 UNIT) PO CAPS: 50000.0000 [IU] | ORAL_CAPSULE | ORAL | 0 refills | Status: DC

## 2021-10-09 LAB — COMPREHENSIVE METABOLIC PANEL
ALT: 22 IU/L (ref 0–32)
AST: 19 IU/L (ref 0–40)
Albumin/Globulin Ratio: 1.4 (ref 1.2–2.2)
Albumin: 4.2 g/dL (ref 3.7–4.7)
Alkaline Phosphatase: 147 IU/L — ABNORMAL HIGH (ref 44–121)
BUN/Creatinine Ratio: 19 (ref 12–28)
BUN: 11 mg/dL (ref 8–27)
Bilirubin Total: 0.4 mg/dL (ref 0.0–1.2)
CO2: 19 mmol/L — ABNORMAL LOW (ref 20–29)
Calcium: 9.5 mg/dL (ref 8.7–10.3)
Chloride: 104 mmol/L (ref 96–106)
Creatinine, Ser: 0.59 mg/dL (ref 0.57–1.00)
Globulin, Total: 3.1 g/dL (ref 1.5–4.5)
Glucose: 100 mg/dL — ABNORMAL HIGH (ref 70–99)
Potassium: 4 mmol/L (ref 3.5–5.2)
Sodium: 140 mmol/L (ref 134–144)
Total Protein: 7.3 g/dL (ref 6.0–8.5)
eGFR: 95 mL/min/{1.73_m2} (ref >59)

## 2021-10-09 LAB — CBC WITH DIFFERENTIAL/PLATELET
Basophils Absolute: 0.1 10*3/uL (ref 0.0–0.2)
Basos: 2 %
EOS (ABSOLUTE): 0.2 10*3/uL (ref 0.0–0.4)
Eos: 7 %
Hematocrit: 40.2 % (ref 34.0–46.6)
Hemoglobin: 12.8 g/dL (ref 11.1–15.9)
Immature Grans (Abs): 0 10*3/uL (ref 0.0–0.1)
Immature Granulocytes: 0 %
Lymphocytes Absolute: 1.5 10*3/uL (ref 0.7–3.1)
Lymphs: 47 %
MCH: 28.2 pg (ref 26.6–33.0)
MCHC: 31.8 g/dL (ref 31.5–35.7)
MCV: 89 fL (ref 79–97)
Monocytes Absolute: 0.3 10*3/uL (ref 0.1–0.9)
Monocytes: 9 %
Neutrophils Absolute: 1.1 10*3/uL — ABNORMAL LOW (ref 1.4–7.0)
Neutrophils: 35 %
Platelets: 217 10*3/uL (ref 150–450)
RBC: 4.54 x10E6/uL (ref 3.77–5.28)
RDW: 13.6 % (ref 11.7–15.4)
WBC: 3.2 10*3/uL — ABNORMAL LOW (ref 3.4–10.8)

## 2021-10-09 LAB — HEMOGLOBIN A1C
Est. average glucose Bld gHb Est-mCnc: 123 mg/dL
Hgb A1c MFr Bld: 5.9 % — ABNORMAL HIGH (ref 4.8–5.6)

## 2021-10-09 LAB — LIPID PANEL WITH LDL/HDL RATIO
Cholesterol, Total: 134 mg/dL (ref 100–199)
HDL: 48 mg/dL (ref >39)
LDL Chol Calc (NIH): 72 mg/dL (ref 0–99)
LDL/HDL Ratio: 1.5 ratio (ref 0.0–3.2)
Triglycerides: 71 mg/dL (ref 0–149)
VLDL Cholesterol Cal: 14 mg/dL (ref 5–40)

## 2021-10-09 LAB — INSULIN, RANDOM: INSULIN: 6.8 u[IU]/mL (ref 2.6–24.9)

## 2021-10-09 LAB — VITAMIN D 25 HYDROXY (VIT D DEFICIENCY, FRACTURES): Vit D, 25-Hydroxy: 36.2 ng/mL (ref 30.0–100.0)

## 2021-10-17 NOTE — Progress Notes (Signed)
Chief Complaint:   OBESITY Sandra Lawrence is here to discuss her progress with her obesity treatment plan along with follow-up of her obesity related diagnoses. Sandra Lawrence is on the Category 2 Plan and states she is following her eating plan approximately 60% of the time. Sandra Lawrence states she is walking 30 minutes 1 times per week.  Today's visit was #: 12 Starting weight: 286 lbs Starting date: 12/18/2020 Today's weight: 250 lbs Today's date: 10/08/2021 Total lbs lost to date: 36 Total lbs lost since last in-office visit: +3  Interim History: Sandra Lawrence has been off plan for several months due to hip surgery. This is her first OV since January 2023. Pt desires new papers of meal plan and breakfast and lunch options. She denies other cravings.  Subjective:   1. Hypertension associated with type 2 diabetes mellitus ([place]) Sandra Lawrence reports BP at home runs 140's/60's and no higher. She is asymptomatic. Pt forgot to take her meds this morning and will take them when she gets home.  2. Status post right hip replacement She had hip replacement on 07/24/2021. Pt's last OV here was 06/11/2021. She is doing well and back to walking and full activities of daily living.  3. Controlled diabetes mellitus type 2 with complications, unspecified whether long term insulin use (HCC) Sandra Lawrence A1c approximately 3 months ago on 07/17/2021 was 6.1. She has not been checking her blood sugar at home.  4. Vitamin D deficiency  Pt has been off Vit D supplement for 2-3 weeks.  Assessment/Plan:   Orders Placed This Encounter  Procedures   CBC w/Diff/Platelet    Medications Discontinued During This Encounter  Medication Reason   oxyCODONE (OXY IR/ROXICODONE) 5 MG immediate release tablet    Vitamin D, Ergocalciferol, (DRISDOL) 1.25 MG (50000 UNIT) CAPS capsule Reorder     Meds ordered this encounter  Medications   Vitamin D, Ergocalciferol, (DRISDOL) 1.25 MG (50000 UNIT) CAPS capsule    Sig: Take 1 capsule (50,000 Units total)  by mouth every 7 (seven) days.    Dispense:  4 capsule    Refill:  0    Ov for RF; 30 d supply     1. Hypertension associated with type 2 diabetes mellitus ([place]) Sandra Lawrence is working on healthy weight loss and exercise to improve blood pressure control. We will watch for signs of hypotension as she continues her lifestyle modifications. Obtain labs today. Continue Hyzaar and lopressor per PCP. Decrease salt intake, follow prudent nutritional plan, and weight loss.  2. Status post right hip replacement Check labs today. Follow up with ortho. Exercise and activity levels as directed by ortho.  - CBC w/Diff/Platelet  3. Controlled diabetes mellitus type 2 with complications, unspecified whether long term insulin use (HCC) Sandra Lawrence blood sugar control is important to decrease the likelihood of diabetic complications such as nephropathy, neuropathy, limb loss, blindness, coronary artery disease, and death. Intensive lifestyle modification including diet, exercise and weight loss are the first line of treatment for diabetes. Pt is diet controlled. Will consider meds in the future as needed.  4. Vitamin D deficiency Low Vitamin D level contributes to fatigue and are associated with obesity, breast, and colon cancer. She agrees to continue to take prescription Vitamin D '@50'$ ,000 IU every week and will follow-up for routine testing of Vitamin D, at least 2-3 times per year to avoid over-replacement. Check labs.  Refill- Vitamin D, Ergocalciferol, (DRISDOL) 1.25 MG (50000 UNIT) CAPS capsule; Take 1 capsule (50,000 Units total) by mouth every 7 (  seven) days.  Dispense: 4 capsule; Refill: 0  5. Obesity with current BMI of 39.2 Sandra Lawrence is currently in the action stage of change. As such, her goal is to continue with weight loss efforts. She has agreed to the Category 2 Plan with breakfast and lunch options.   Exercise goals:  As is  Behavioral modification strategies: planning for success.  Sandra Lawrence has agreed to  follow-up with our clinic in 2 weeks. She was informed of the importance of frequent follow-up visits to maximize her success with intensive lifestyle modifications for her multiple health conditions.   Sandra Lawrence was informed we would discuss her lab results at her next visit unless there is a critical issue that needs to be addressed sooner. Sandra Lawrence agreed to keep her next visit at the agreed upon time to discuss these results.  Objective:   Blood pressure (!) 176/90, pulse (!) 56, temperature 98 F (36.7 C), height '5\' 7"'$  (1.702 m), weight 250 lb (113.4 kg), SpO2 97 %. Body mass index is 39.16 kg/m.  General: Cooperative, alert, well developed, in no acute distress. HEENT: Conjunctivae and lids unremarkable. Cardiovascular: Regular rhythm.  Lungs: Normal work of breathing. Neurologic: No focal deficits.   Lab Results  Component Value Date   CREATININE 0.59 10/08/2021   BUN 11 10/08/2021   NA 140 10/08/2021   K 4.0 10/08/2021   CL 104 10/08/2021   CO2 19 (L) 10/08/2021   Lab Results  Component Value Date   ALT 22 10/08/2021   AST 19 10/08/2021   ALKPHOS 147 (H) 10/08/2021   BILITOT 0.4 10/08/2021   Lab Results  Component Value Date   HGBA1C 5.9 (H) 10/08/2021   HGBA1C 6.1 (H) 07/17/2021   HGBA1C 6.7 (H) 04/02/2021   HGBA1C 7.6 (H) 12/18/2020   HGBA1C 7.5 (A) 09/26/2020   Lab Results  Component Value Date   INSULIN 6.8 10/08/2021   INSULIN 7.6 04/02/2021   INSULIN 12.1 12/18/2020   Lab Results  Component Value Date   TSH 1.650 12/18/2020   Lab Results  Component Value Date   CHOL 134 10/08/2021   HDL 48 10/08/2021   LDLCALC 72 10/08/2021   TRIG 71 10/08/2021   CHOLHDL 3.8 02/21/2019   Lab Results  Component Value Date   VD25OH 36.2 10/08/2021   VD25OH 66.4 04/02/2021   VD25OH 23.0 (L) 12/18/2020   Lab Results  Component Value Date   WBC 3.2 (L) 10/08/2021   HGB 12.8 10/08/2021   HCT 40.2 10/08/2021   MCV 89 10/08/2021   PLT 217 10/08/2021   No results  found for: IRON, TIBC, FERRITIN  Obesity Behavioral Intervention:   Approximately 15 minutes were spent on the discussion below.  ASK: We discussed the diagnosis of obesity with Sandra Lawrence today and Sandra Lawrence agreed to give [place] permission to discuss obesity behavioral modification therapy today.  ASSESS: Sandra Lawrence has the diagnosis of obesity and her BMI today is 39.2. Sandra Lawrence is in the action stage of change.   ADVISE: Sandra Lawrence was educated on the multiple health risks of obesity as well as the benefit of weight loss to improve her health. She was advised of the need for long term treatment and the importance of lifestyle modifications to improve her current health and to decrease her risk of future health problems.  AGREE: Multiple dietary modification options and treatment options were discussed and Sandra Lawrence agreed to follow the recommendations documented in the above note.  ARRANGE: Sandra Lawrence was educated on the importance of frequent visits to  treat obesity as outlined per CMS and USPSTF guidelines and agreed to schedule her next follow up appointment today.  Attestation Statements:   Reviewed by clinician on day of visit: allergies, medications, problem list, medical history, surgical history, family history, social history, and previous encounter notes.  I, Kathlene November, BS, CMA, am acting as transcriptionist for [employer], DO.  I have reviewed the above documentation for accuracy and completeness, and I agree with the above. Marjory Sneddon, D.O.  The [place] was signed into law in 2016 which includes the topic of electronic health records.  This provides immediate access to information in MyChart.  This includes consultation notes, operative notes, office notes, lab results and pathology reports.  If you have any questions about what you read please let us know at your next visit so we can discuss your concerns and take corrective action if need be.  We are right here with  you.

## 2021-10-21 ENCOUNTER — Encounter (INDEPENDENT_AMBULATORY_CARE_PROVIDER_SITE_OTHER): Payer: Self-pay | Admitting: Family Medicine

## 2021-10-21 ENCOUNTER — Ambulatory Visit (INDEPENDENT_AMBULATORY_CARE_PROVIDER_SITE_OTHER): Payer: Medicare PPO | Admitting: Family Medicine

## 2021-10-21 VITALS — BP 137/73 | HR 56 | Temp 98.1°F | Ht 67.0 in | Wt 252.0 lb

## 2021-10-21 DIAGNOSIS — D72819 Decreased white blood cell count, unspecified: Secondary | ICD-10-CM | POA: Diagnosis not present

## 2021-10-21 DIAGNOSIS — I152 Hypertension secondary to endocrine disorders: Secondary | ICD-10-CM

## 2021-10-21 DIAGNOSIS — E559 Vitamin D deficiency, unspecified: Secondary | ICD-10-CM | POA: Diagnosis not present

## 2021-10-21 DIAGNOSIS — E669 Obesity, unspecified: Secondary | ICD-10-CM

## 2021-10-21 DIAGNOSIS — E1159 Type 2 diabetes mellitus with other circulatory complications: Secondary | ICD-10-CM

## 2021-10-21 DIAGNOSIS — Z6839 Body mass index (BMI) 39.0-39.9, adult: Secondary | ICD-10-CM

## 2021-10-22 ENCOUNTER — Other Ambulatory Visit: Payer: Self-pay

## 2021-10-22 NOTE — Patient Outreach (Signed)
[facility]) Care Management  10/22/2021  ANTANIYA VENUTI 1945-12-01 [id number]   Multiple attempts to contact patient without success. No response from letter mailed to patient.   Plan: RN CM will close case at this time.   [name], RN, MSN [facility] Care Management Care Management Coordinator Direct Line [phone] Toll Free: [phone]  Fax: [phone]

## 2021-10-23 ENCOUNTER — Other Ambulatory Visit: Payer: Self-pay | Admitting: Family Medicine

## 2021-10-26 DIAGNOSIS — M25512 Pain in left shoulder: Secondary | ICD-10-CM | POA: Diagnosis not present

## 2021-10-26 DIAGNOSIS — M1611 Unilateral primary osteoarthritis, right hip: Secondary | ICD-10-CM | POA: Diagnosis not present

## 2021-10-27 NOTE — Progress Notes (Signed)
Chief Complaint:   OBESITY Sandra Lawrence is here to discuss her progress with her obesity treatment plan along with Lawrence of her obesity related diagnoses. Sandra Lawrence is on the Category 2 Plan with breakfast and lunch options and states she is following her eating plan approximately 50% of the time. Sandra Lawrence states she is not currently exercising.  Today's visit was #: 13 Starting weight: 286 lbs Starting date: 12/18/2020 Today's weight: 252 lbs Today's date: 10/21/2021 Total lbs lost to date: 34 Total lbs lost since last in-office visit: +2  Interim History: Sandra Lawrence took in 7 homeless family members recently. She ate much less than usual, as she gave her food away to them. They stayed for 5 days and just left her house. Pt is worried about a couple of her lab results. We will review them today.  Subjective:   1. Hypertension associated with type 2 diabetes mellitus ([place]) Discussed labs with patient today. Sandra Lawrence's last BP in office was 176/90 and today it is improved to 137/73.  2. Leukopenia, unspecified type New. Discussed labs with patient today. Pt is asymptomatic and has no prior history of low white blood cell count.  3. Vitamin D deficiency Worsening. Discussed labs with patient today. Pt's alkaline phosphate is 147, most likely because she has been off Vit D for a month.  Assessment/Plan:  No orders of the defined types were placed in this encounter.   There are no discontinued medications.   No orders of the defined types were placed in this encounter.    1. Hypertension associated with type 2 diabetes mellitus ([place]) CMP is within normal limits and A1c is great at 5.9! Sandra Lawrence is working on healthy weight loss and exercise to improve blood pressure control. We will watch for signs of hypotension as she continues her lifestyle modifications.  2. Leukopenia, unspecified type Recheck white blood cell count in 2 months. Continue prudent nutritional plan.  3. Vitamin D  deficiency Vitamin D is worse than prior and not at goal. Low Vitamin D level contributes to fatigue and are associated with obesity, breast, and colon cancer. She agrees to continue to take prescription Vitamin D '@50'$ ,000 IU every week and will Lawrence for routine testing of Vitamin D, at least 2-3 times per year to avoid over-replacement.  4. Obesity, Current BMI 39.5 Sandra Lawrence is currently in the action stage of change. As such, her goal is to continue with weight loss efforts. She has Lawrence to the Category 2 Plan with breakfast and lunch options.   Exercise goals:  As is  Behavioral modification strategies: no skipping meals and planning for success.  Sandra Lawrence with our clinic in 3 weeks. She was informed of the importance of frequent Lawrence visits to maximize her success with intensive lifestyle modifications for her multiple health conditions.   Objective:   Blood pressure 137/73, pulse (!) 56, temperature 98.1 F (36.7 C), height '5\' 7"'$  (1.702 m), weight 252 lb (114.3 kg), SpO2 98 %. Body mass index is 39.47 kg/m.  General: Cooperative, alert, well developed, in no acute distress. HEENT: Conjunctivae and lids unremarkable. Cardiovascular: Regular rhythm.  Lungs: Normal work of breathing. Neurologic: No focal deficits.   Lab Results  Component Value Date   CREATININE 0.59 10/08/2021   BUN 11 10/08/2021   NA 140 10/08/2021   K 4.0 10/08/2021   CL 104 10/08/2021   CO2 19 (L) 10/08/2021   Lab Results  Component Value Date   ALT 22 10/08/2021  AST 19 10/08/2021   ALKPHOS 147 (H) 10/08/2021   BILITOT 0.4 10/08/2021   Lab Results  Component Value Date   HGBA1C 5.9 (H) 10/08/2021   HGBA1C 6.1 (H) 07/17/2021   HGBA1C 6.7 (H) 04/02/2021   HGBA1C 7.6 (H) 12/18/2020   HGBA1C 7.5 (A) 09/26/2020   Lab Results  Component Value Date   INSULIN 6.8 10/08/2021   INSULIN 7.6 04/02/2021   INSULIN 12.1 12/18/2020   Lab Results  Component Value Date   TSH  1.650 12/18/2020   Lab Results  Component Value Date   CHOL 134 10/08/2021   HDL 48 10/08/2021   LDLCALC 72 10/08/2021   TRIG 71 10/08/2021   CHOLHDL 3.8 02/21/2019   Lab Results  Component Value Date   VD25OH 36.2 10/08/2021   VD25OH 66.4 04/02/2021   VD25OH 23.0 (L) 12/18/2020   Lab Results  Component Value Date   WBC 3.2 (L) 10/08/2021   HGB 12.8 10/08/2021   HCT 40.2 10/08/2021   MCV 89 10/08/2021   PLT 217 10/08/2021   No results found for: "IRON", "TIBC", "FERRITIN"  Obesity Behavioral Intervention:   Approximately 15 minutes were spent on the discussion below.  ASK: We discussed the diagnosis of obesity with Sandra Lawrence today and Sandra Lawrence to give [place] permission to discuss obesity behavioral modification therapy today.  ASSESS: Sandra Lawrence has the diagnosis of obesity and her BMI today is 39.5. Sandra Lawrence is in the action stage of change.   ADVISE: Sandra Lawrence was educated on the multiple health risks of obesity as well as the benefit of weight loss to improve her health. She was advised of the need for long term treatment and the importance of lifestyle modifications to improve her current health and to decrease her risk of future health problems.  AGREE: Multiple dietary modification options and treatment options were discussed and Sandra Lawrence Lawrence to follow the recommendations documented in the above note.  ARRANGE: Sandra Lawrence was educated on the importance of frequent visits to treat obesity as outlined per CMS and USPSTF guidelines and Lawrence to schedule her next follow up appointment today.  Attestation Statements:   Reviewed by clinician on day of visit: allergies, medications, problem list, medical history, surgical history, family history, social history, and previous encounter notes.  I, Kathlene November, BS, CMA, am acting as transcriptionist for [employer], DO.  I have reviewed the above documentation for accuracy and completeness, and I agree with the above. Marjory Sneddon, D.O.  The [place] was signed into law in 2016 which includes the topic of electronic health records.  This provides immediate access to information in MyChart.  This includes consultation notes, operative notes, office notes, lab results and pathology reports.  If you have any questions about what you read please let us know at your next visit so we can discuss your concerns and take corrective action if need be.  We are right here with you.

## 2021-11-05 ENCOUNTER — Encounter (INDEPENDENT_AMBULATORY_CARE_PROVIDER_SITE_OTHER): Payer: Self-pay | Admitting: Family Medicine

## 2021-11-05 ENCOUNTER — Ambulatory Visit (INDEPENDENT_AMBULATORY_CARE_PROVIDER_SITE_OTHER): Payer: Medicare PPO | Admitting: Family Medicine

## 2021-11-05 VITALS — BP 146/84 | HR 58 | Temp 97.9°F | Ht 67.0 in | Wt 249.0 lb

## 2021-11-05 DIAGNOSIS — E1169 Type 2 diabetes mellitus with other specified complication: Secondary | ICD-10-CM | POA: Diagnosis not present

## 2021-11-05 DIAGNOSIS — E669 Obesity, unspecified: Secondary | ICD-10-CM

## 2021-11-05 DIAGNOSIS — Z6839 Body mass index (BMI) 39.0-39.9, adult: Secondary | ICD-10-CM | POA: Diagnosis not present

## 2021-11-07 ENCOUNTER — Other Ambulatory Visit: Payer: Self-pay | Admitting: Family Medicine

## 2021-11-10 DIAGNOSIS — E119 Type 2 diabetes mellitus without complications: Secondary | ICD-10-CM | POA: Insufficient documentation

## 2021-11-10 NOTE — Progress Notes (Signed)
Chief Complaint:   OBESITY Sandra Lawrence is here to discuss her progress with her obesity treatment plan along with follow-up of her obesity related diagnoses. Sandra Lawrence is on the Category 2 Plan with breakfast options and states she is following her eating plan approximately 80% of the time. Sandra Lawrence states she is walking more.  Today's visit was #: 14 Starting weight: 286 lbs Starting date: 12/18/2020 Today's weight: 249 lbs Today's date: 11/05/2021 Total lbs lost to date: 37 lbs Total lbs lost since last in-office visit: 3 lbs  Interim History: Sandra Lawrence has lost the 2 lbs from last office visit plus some.  She has some hunger after dinner at 6:30 PM and increased hunger after 9-9:30 PM.   Subjective:   1. Type 2 diabetes mellitus with other specified complication, without long-term current use of insulin (HCC) Rechelle denies history of pancreatitis. Her daughter has history of thyroid cancer, she does not know what kind.  Denies MEN syndromes in family.  Metformin killed her stomach and made it hurt badly.    Assessment/Plan:  No orders of the defined types were placed in this encounter.   There are no discontinued medications.   No orders of the defined types were placed in this encounter.    1. Type 2 diabetes mellitus with other specified complication, without long-term current use of insulin (HCC) Chamia will clarify with daughter what type of thyroid cancer she had.  If it is not medullary, we will consider GLP-1 due to intolerance of Metformin.   2. Obesity, Current BMI 39.1 Sandra Lawrence is currently in the action stage of change. As such, her goal is to continue with weight loss efforts. She has agreed to the Category 2 Plan with breakfast and lunch options.   Exercise goals:  As is.   Behavioral modification strategies: increasing lean protein intake and decreasing simple carbohydrates.  Sandra Lawrence has agreed to follow-up with our clinic in 2-3 weeks. She was informed of the importance of  frequent follow-up visits to maximize her success with intensive lifestyle modifications for her multiple health conditions.   Objective:   Blood pressure (!) 146/84, pulse (!) 58, temperature 97.9 F (36.6 C), height 5\' 7"  (1.702 m), weight 249 lb (112.9 kg), SpO2 98 %. Body mass index is 39 kg/m.  General: Cooperative, alert, well developed, in no acute distress. HEENT: Conjunctivae and lids unremarkable. Cardiovascular: Regular rhythm.  Lungs: Normal work of breathing. Neurologic: No focal deficits.   Lab Results  Component Value Date   CREATININE 0.59 10/08/2021   BUN 11 10/08/2021   NA 140 10/08/2021   K 4.0 10/08/2021   CL 104 10/08/2021   CO2 19 (L) 10/08/2021   Lab Results  Component Value Date   ALT 22 10/08/2021   AST 19 10/08/2021   ALKPHOS 147 (H) 10/08/2021   BILITOT 0.4 10/08/2021   Lab Results  Component Value Date   HGBA1C 5.9 (H) 10/08/2021   HGBA1C 6.1 (H) 07/17/2021   HGBA1C 6.7 (H) 04/02/2021   HGBA1C 7.6 (H) 12/18/2020   HGBA1C 7.5 (A) 09/26/2020   Lab Results  Component Value Date   INSULIN 6.8 10/08/2021   INSULIN 7.6 04/02/2021   INSULIN 12.1 12/18/2020   Lab Results  Component Value Date   TSH 1.650 12/18/2020   Lab Results  Component Value Date   CHOL 134 10/08/2021   HDL 48 10/08/2021   LDLCALC 72 10/08/2021   TRIG 71 10/08/2021   CHOLHDL 3.8 02/21/2019   Lab Results  Component Value Date   VD25OH 36.2 10/08/2021   VD25OH 66.4 04/02/2021   VD25OH 23.0 (L) 12/18/2020   Lab Results  Component Value Date   WBC 3.2 (L) 10/08/2021   HGB 12.8 10/08/2021   HCT 40.2 10/08/2021   MCV 89 10/08/2021   PLT 217 10/08/2021   No results found for: "IRON", "TIBC", "FERRITIN"  Attestation Statements:   Reviewed by clinician on day of visit: allergies, medications, problem list, medical history, surgical history, family history, social history, and previous encounter notes.  I, Malcolm Metro, RMA, am acting as Energy manager for  [employer], DO.  I have reviewed the above documentation for accuracy and completeness, and I agree with the above. -   I have reviewed the above documentation for accuracy and completeness, and I agree with the above. Carlye Grippe, D.O.  The 21st Century Cures Act was signed into law in 2016 which includes the topic of electronic health records.  This provides immediate access to information in MyChart.  This includes consultation notes, operative notes, office notes, lab results and pathology reports.  If you have any questions about what you read please let us know at your next visit so we can discuss your concerns and take corrective action if need be.  We are right here with you.

## 2021-11-23 ENCOUNTER — Ambulatory Visit (INDEPENDENT_AMBULATORY_CARE_PROVIDER_SITE_OTHER): Payer: Medicare PPO | Admitting: Family Medicine

## 2021-11-23 ENCOUNTER — Encounter (INDEPENDENT_AMBULATORY_CARE_PROVIDER_SITE_OTHER): Payer: Self-pay | Admitting: Family Medicine

## 2021-11-23 VITALS — BP 148/86 | HR 58 | Temp 98.4°F | Ht 67.0 in | Wt 252.0 lb

## 2021-11-23 DIAGNOSIS — E669 Obesity, unspecified: Secondary | ICD-10-CM | POA: Diagnosis not present

## 2021-11-23 DIAGNOSIS — E785 Hyperlipidemia, unspecified: Secondary | ICD-10-CM | POA: Diagnosis not present

## 2021-11-23 DIAGNOSIS — E559 Vitamin D deficiency, unspecified: Secondary | ICD-10-CM

## 2021-11-23 DIAGNOSIS — Z6839 Body mass index (BMI) 39.0-39.9, adult: Secondary | ICD-10-CM | POA: Diagnosis not present

## 2021-11-23 DIAGNOSIS — E1169 Type 2 diabetes mellitus with other specified complication: Secondary | ICD-10-CM

## 2021-11-23 MED ORDER — VITAMIN D (ERGOCALCIFEROL) 1.25 MG (50000 UNIT) PO CAPS: 50000.0000 [IU] | ORAL_CAPSULE | ORAL | 0 refills | Status: DC

## 2021-11-23 MED ORDER — ROSUVASTATIN CALCIUM 20 MG PO TABS: 20.0000 mg | ORAL_TABLET | Freq: Every day | ORAL | 0 refills | Status: DC

## 2021-11-26 ENCOUNTER — Other Ambulatory Visit (INDEPENDENT_AMBULATORY_CARE_PROVIDER_SITE_OTHER): Payer: Self-pay | Admitting: Family Medicine

## 2021-11-26 ENCOUNTER — Other Ambulatory Visit: Payer: Self-pay | Admitting: Family Medicine

## 2021-11-26 DIAGNOSIS — E559 Vitamin D deficiency, unspecified: Secondary | ICD-10-CM

## 2021-11-26 NOTE — Progress Notes (Signed)
Chief Complaint:   OBESITY Sandra Lawrence is here to discuss her progress with her obesity treatment plan along with follow-up of her obesity related diagnoses. Mackenze is on the Category 2 Plan with breakfast and lunch options and states she is following her eating plan approximately 75% of the time. Keyanah states she is walking more.  Today's visit was #: 15 Starting weight: 286 lbs Starting date: 12/18/2020 Today's weight: 252 lbs Today's date: 11/23/2021 Total lbs lost to date: 34 Total lbs lost since last in-office visit: +3  Interim History: Sandra Lawrence has been skipping foods/meals occasionally. She gained in fat mass and admits to eating more carbs and less proteins.   Subjective:   1. Hyperlipidemia associated with type 2 diabetes mellitus ([place]) Pt has been taking 40 mg Crestor every other night.  2. Vitamin D deficiency Vit D level was not at goal when checked a month ago because pt was not taking meds regularly.  Assessment/Plan:  No orders of the defined types were placed in this encounter.   Medications Discontinued During This Encounter  Medication Reason   rosuvastatin (CRESTOR) 40 MG tablet Reorder   Vitamin D, Ergocalciferol, (DRISDOL) 1.25 MG (50000 UNIT) CAPS capsule Reorder     Meds ordered this encounter  Medications   rosuvastatin (CRESTOR) 20 MG tablet    Sig: Take 1 tablet (20 mg total) by mouth at bedtime.    Dispense:  90 tablet    Refill:  0   Vitamin D, Ergocalciferol, (DRISDOL) 1.25 MG (50000 UNIT) CAPS capsule    Sig: Take 1 capsule (50,000 Units total) by mouth every 7 (seven) days.    Dispense:  4 capsule    Refill:  0    Ov for RF; 30 d supply     1. Hyperlipidemia associated with type 2 diabetes mellitus ([place]) Cardiovascular risk and specific lipid/LDL goals reviewed.  We discussed several lifestyle modifications today and Dachelle will continue to work on diet, exercise and weight loss efforts. Orders and follow up as documented in patient record. LDL  was at goal   Counseling Intensive lifestyle modifications are the first line treatment for this issue. Dietary changes: Increase soluble fiber. Decrease simple carbohydrates. Exercise changes: Moderate to vigorous-intensity aerobic activity 150 minutes per week if tolerated. Lipid-lowering medications: see documented in medical record.  Refill- rosuvastatin (CRESTOR) 20 MG tablet; Take 1 tablet (20 mg total) by mouth at bedtime.  Dispense: 90 tablet; Refill: 0  2. Vitamin D deficiency Low Vitamin D level contributes to fatigue and are associated with obesity, breast, and colon cancer. She agrees to continue to take prescription Vitamin D '@50'$ ,000 IU every week and will follow-up for routine testing of Vitamin D, at least 2-3 times per year to avoid over-replacement.  Refill- Vitamin D, Ergocalciferol, (DRISDOL) 1.25 MG (50000 UNIT) CAPS capsule; Take 1 capsule (50,000 Units total) by mouth every 7 (seven) days.  Dispense: 4 capsule; Refill: 0  3. Obesity, Current BMI 39.6 Sandra Lawrence is currently in the action stage of change. As such, her goal is to continue with weight loss efforts. She has agreed to the Category 2 Plan with breakfast and lunch options.   Exercise goals:  Start going to [employer] 2 days a week.  Behavioral modification strategies: no skipping meals and planning for success.  [name] has agreed to follow-up with our clinic in 2-3 weeks. She was informed of the importance of frequent follow-up visits to maximize her success with intensive lifestyle modifications for her  multiple health conditions.   Objective:   Blood pressure (!) 148/86, pulse (!) 58, temperature 98.4 F (36.9 C), height '5\' 7"'$  (1.702 m), weight 252 lb (114.3 kg), SpO2 98 %. Body mass index is 39.47 kg/m.  General: Cooperative, alert, well developed, in no acute distress. HEENT: Conjunctivae and lids unremarkable. Cardiovascular: Regular rhythm.  Lungs: Normal work of breathing. Neurologic: No focal  deficits.   Lab Results  Component Value Date   CREATININE 0.59 10/08/2021   BUN 11 10/08/2021   NA 140 10/08/2021   K 4.0 10/08/2021   CL 104 10/08/2021   CO2 19 (L) 10/08/2021   Lab Results  Component Value Date   ALT 22 10/08/2021   AST 19 10/08/2021   ALKPHOS 147 (H) 10/08/2021   BILITOT 0.4 10/08/2021   Lab Results  Component Value Date   HGBA1C 5.9 (H) 10/08/2021   HGBA1C 6.1 (H) 07/17/2021   HGBA1C 6.7 (H) 04/02/2021   HGBA1C 7.6 (H) 12/18/2020   HGBA1C 7.5 (A) 09/26/2020   Lab Results  Component Value Date   INSULIN 6.8 10/08/2021   INSULIN 7.6 04/02/2021   INSULIN 12.1 12/18/2020   Lab Results  Component Value Date   TSH 1.650 12/18/2020   Lab Results  Component Value Date   CHOL 134 10/08/2021   HDL 48 10/08/2021   LDLCALC 72 10/08/2021   TRIG 71 10/08/2021   CHOLHDL 3.8 02/21/2019   Lab Results  Component Value Date   VD25OH 36.2 10/08/2021   VD25OH 66.4 04/02/2021   VD25OH 23.0 (L) 12/18/2020   Lab Results  Component Value Date   WBC 3.2 (L) 10/08/2021   HGB 12.8 10/08/2021   HCT 40.2 10/08/2021   MCV 89 10/08/2021   PLT 217 10/08/2021   No results found for: "IRON", "TIBC", "FERRITIN"  Obesity Behavioral Intervention:   Approximately 15 minutes were spent on the discussion below.  ASK: We discussed the diagnosis of obesity with [name] today and [name] agreed to give Sandra Lawrence permission to discuss obesity behavioral modification therapy today.  ASSESS: [name] has the diagnosis of obesity and her BMI today is 39.6. [name] is in the action stage of change.   ADVISE: [name] was educated on the multiple health risks of obesity as well as the benefit of weight loss to improve her health. She was advised of the need for long term treatment and the importance of lifestyle modifications to improve her current health and to decrease her risk of future health problems.  AGREE: Multiple dietary modification options and treatment options were discussed  and [name] agreed to follow the recommendations documented in the above note.  ARRANGE: [name] was educated on the importance of frequent visits to treat obesity as outlined per CMS and USPSTF guidelines and agreed to schedule her next follow up appointment today.  Attestation Statements:   Reviewed by clinician on day of visit: allergies, medications, problem list, medical history, surgical history, family history, social history, and previous encounter notes.  I, Kathlene November, BS, CMA, am acting as transcriptionist for [employer], DO.   I have reviewed the above documentation for accuracy and completeness, and I agree with the above. Marjory Sneddon, D.O.  The [place] was signed into law in 2016 which includes the topic of electronic health records.  This provides immediate access to information in MyChart.  This includes consultation notes, operative notes, office notes, lab results and pathology reports.  If you have any questions about what you read please  let Sandra Lawrence know at your next visit so we can discuss your concerns and take corrective action if need be.  We are right here with you.

## 2021-11-27 NOTE — Telephone Encounter (Signed)
Pt is scheduled for august

## 2021-12-10 ENCOUNTER — Ambulatory Visit (INDEPENDENT_AMBULATORY_CARE_PROVIDER_SITE_OTHER): Payer: Medicare PPO | Admitting: Family Medicine

## 2021-12-13 ENCOUNTER — Other Ambulatory Visit: Payer: Self-pay | Admitting: Medical

## 2021-12-22 ENCOUNTER — Ambulatory Visit: Payer: Self-pay

## 2021-12-23 ENCOUNTER — Encounter (INDEPENDENT_AMBULATORY_CARE_PROVIDER_SITE_OTHER): Payer: Self-pay

## 2021-12-24 ENCOUNTER — Encounter (INDEPENDENT_AMBULATORY_CARE_PROVIDER_SITE_OTHER): Payer: Self-pay | Admitting: Adult Health

## 2021-12-24 ENCOUNTER — Ambulatory Visit (INDEPENDENT_AMBULATORY_CARE_PROVIDER_SITE_OTHER): Payer: Medicare PPO | Admitting: Adult Health

## 2021-12-24 ENCOUNTER — Encounter (INDEPENDENT_AMBULATORY_CARE_PROVIDER_SITE_OTHER): Payer: Self-pay

## 2021-12-24 VITALS — BP 148/86 | HR 63 | Temp 97.7°F | Ht 67.0 in | Wt 251.0 lb

## 2021-12-24 DIAGNOSIS — E1169 Type 2 diabetes mellitus with other specified complication: Secondary | ICD-10-CM

## 2021-12-24 DIAGNOSIS — D72819 Decreased white blood cell count, unspecified: Secondary | ICD-10-CM

## 2021-12-24 DIAGNOSIS — Z6841 Body Mass Index (BMI) 40.0 and over, adult: Secondary | ICD-10-CM

## 2022-01-06 ENCOUNTER — Encounter: Payer: Medicare PPO | Admitting: Family Medicine

## 2022-01-07 DIAGNOSIS — M1612 Unilateral primary osteoarthritis, left hip: Secondary | ICD-10-CM | POA: Diagnosis not present

## 2022-01-07 NOTE — Progress Notes (Signed)
No Show

## 2022-01-08 ENCOUNTER — Encounter: Payer: Medicare PPO | Admitting: Family Medicine

## 2022-01-20 ENCOUNTER — Encounter: Payer: Self-pay | Admitting: Family Medicine

## 2022-01-20 ENCOUNTER — Ambulatory Visit: Payer: Medicare PPO | Admitting: Family Medicine

## 2022-01-20 ENCOUNTER — Encounter: Payer: Self-pay | Admitting: Internal Medicine

## 2022-01-20 VITALS — BP 150/80 | HR 68 | Temp 97.3°F | Ht 67.0 in | Wt 256.2 lb

## 2022-01-20 DIAGNOSIS — Z96641 Presence of right artificial hip joint: Secondary | ICD-10-CM | POA: Diagnosis not present

## 2022-01-20 DIAGNOSIS — Z6841 Body Mass Index (BMI) 40.0 and over, adult: Secondary | ICD-10-CM | POA: Diagnosis not present

## 2022-01-20 DIAGNOSIS — E118 Type 2 diabetes mellitus with unspecified complications: Secondary | ICD-10-CM

## 2022-01-20 DIAGNOSIS — E1169 Type 2 diabetes mellitus with other specified complication: Secondary | ICD-10-CM | POA: Diagnosis not present

## 2022-01-20 DIAGNOSIS — I152 Hypertension secondary to endocrine disorders: Secondary | ICD-10-CM | POA: Diagnosis not present

## 2022-01-20 DIAGNOSIS — N3281 Overactive bladder: Secondary | ICD-10-CM

## 2022-01-20 DIAGNOSIS — E1159 Type 2 diabetes mellitus with other circulatory complications: Secondary | ICD-10-CM

## 2022-01-20 DIAGNOSIS — E785 Hyperlipidemia, unspecified: Secondary | ICD-10-CM | POA: Diagnosis not present

## 2022-01-20 NOTE — Progress Notes (Signed)
   Subjective:    Patient ID: Sandra Lawrence, female    DOB: 1945/11/11, 76 y.o.   MRN: [id number]  HPI She is here for an interval evaluation.  She is going to medical weight loss and wellness.  She had a hip replacement in March and is still recovering from that.  She continues on metoprolol and losartan/HCTZ.  She does follow-up regularly with cardiology for her underlying heart disease.  She is scheduled to be seen within the next several months.  She continues on Crestor and is having no difficulty with that.  She does have a history of OAB and does get fairly decent results using Vesicare.  She has a history of OSA but is intolerant to using CPAP.   Review of Systems     Objective:   Physical Exam Alert and in no distress. Tympanic membranes and canals are normal. Pharyngeal area is normal. Neck is supple without adenopathy or thyromegaly. Cardiac exam shows a regular sinus rhythm without murmurs or gallops. Lungs are clear to auscultation.        Assessment & Plan:  Type 2 diabetes mellitus with other specified complication, without long-term current use of insulin (H[facility])  Obesity, Current BMI 39.4  Hypertension associated with type 2 diabetes mellitus ([facility])  OAB (overactive bladder)  Controlled diabetes mellitus type 2 with complications, unspecified whether long term insulin use ([place])  Hyperlipidemia associated with type 2 diabetes mellitus ([place])  Status post right hip replacement Encouraged her to become more physically active.  I will defer judgment on her blood pressure to her cardiologist.  She has had difficulty with amlodipine in the past and therefore hold off on that.  Continue to go to medical weight loss and wellness.  Continue on Vesicare.  Continue to do physical therapy for the hip and hopefully reduce the need for the cane.  Recheck here in roughly 6 months.

## 2022-02-02 ENCOUNTER — Telehealth: Payer: Self-pay | Admitting: Family Medicine

## 2022-02-02 NOTE — Telephone Encounter (Signed)
Left message for patient to call back and schedule Medicare Annual Wellness Visit (AWV) either virtually or in office. I left my number for patient to call [phone].  Last AWV ; 01/24/21 please schedule at anytime with health coach

## 2022-02-09 ENCOUNTER — Ambulatory Visit: Payer: Medicare PPO | Admitting: Physician Assistant

## 2022-02-09 ENCOUNTER — Ambulatory Visit: Payer: Medicare PPO | Admitting: Cardiology

## 2022-02-19 ENCOUNTER — Ambulatory Visit (INDEPENDENT_AMBULATORY_CARE_PROVIDER_SITE_OTHER): Payer: Medicare PPO

## 2022-02-19 ENCOUNTER — Telehealth: Payer: Self-pay

## 2022-02-19 VITALS — Ht 67.0 in | Wt 260.0 lb

## 2022-02-19 DIAGNOSIS — Z1211 Encounter for screening for malignant neoplasm of colon: Secondary | ICD-10-CM

## 2022-02-19 DIAGNOSIS — Z0000 Encounter for general adult medical examination without abnormal findings: Secondary | ICD-10-CM

## 2022-02-19 DIAGNOSIS — Z86010 Personal history of colonic polyps: Secondary | ICD-10-CM
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
## 2022-02-19 NOTE — Telephone Encounter (Signed)
   Telephone encounter was:  Successful.  02/19/2022 Name: Sandra Lawrence MRN: [id number] DOB: 25-Jan-1946  Sandra Lawrence is a 76 y.o. year old female who is a primary care patient of [employer], Elyse Jarvis, MD . The community resource team was consulted for assistance with [place] guide performed the following interventions: Patient provided with information about care guide support team and interviewed to confirm resource needs Discussed resources to assist with Food. Patient advised she does not qualify for MOWs as she just recently applied. Patient advised she isn't interested in frozen meals or food pantries as she is on a strict food in-take . Patient advised fresh fruits and vegetables would be best. CG educated patient about the [employer] and sent patient their information and calendar to her e-mail. Patient has been advised if further support is needed, to please call or e-mail me. Patient understood.  Follow Up Plan:  No further follow up planned at this time. The patient has been provided with needed resources.  [place] management  [place], [place]  Main Phone: [phone]  E-mail: [email or web address]  Website: [email or web address]

## 2022-02-19 NOTE — Patient Instructions (Signed)
Sandra Lawrence , Thank you for taking time to come for your Medicare Wellness Visit. I appreciate your ongoing commitment to your health goals. Please review the following plan we discussed and let me know if I can assist you in the future.   Screening recommendations/referrals: Colonoscopy: ordered today Mammogram: completed 04/16/2021, due 04/17/2022 Bone Density: completed 09/04/2002 Recommended yearly ophthalmology/optometry visit for glaucoma screening and checkup Recommended yearly dental visit for hygiene and checkup  Vaccinations: Influenza vaccine: due Pneumococcal vaccine: completed 11/04/2016 Tdap vaccine: due Shingles vaccine: needs second dose   Covid-19: 12/25/2020, 06/29/2019, 06/08/2019  Advanced directives: Advance directive discussed with you today.   Conditions/risks identified: none  Next appointment: Follow up in one year for your annual wellness visit    Preventive Care 65 Years and Older, Female Preventive care refers to lifestyle choices and visits with your health care provider that can promote health and wellness. What does preventive care include? A yearly physical exam. This is also called an annual well check. Dental exams once or twice a year. Routine eye exams. Ask your health care provider how often you should have your eyes checked. Personal lifestyle choices, including: Daily care of your teeth and gums. Regular physical activity. Eating a healthy diet. Avoiding tobacco and drug use. Limiting alcohol use. Practicing safe sex. Taking low-dose aspirin every day. Taking vitamin and mineral supplements as recommended by your health care provider. What happens during an annual well check? The services and screenings done by your health care provider during your annual well check will depend on your age, overall health, lifestyle risk factors, and family history of disease. Counseling  Your health care provider may ask you questions about your: Alcohol  use. Tobacco use. Drug use. Emotional well-being. Home and relationship well-being. Sexual activity. Eating habits. History of falls. Memory and ability to understand (cognition). Work and work Statistician. Reproductive health. Screening  You may have the following tests or measurements: Height, weight, and BMI. Blood pressure. Lipid and cholesterol levels. These may be checked every 5 years, or more frequently if you are over 22 years old. Skin check. Lung cancer screening. You may have this screening every year starting at age 87 if you have a 30-pack-year history of smoking and currently smoke or have quit within the past 15 years. Fecal occult blood test (FOBT) of the stool. You may have this test every year starting at age 73. Flexible sigmoidoscopy or colonoscopy. You may have a sigmoidoscopy every 5 years or a colonoscopy every 10 years starting at age 61. Hepatitis C blood test. Hepatitis B blood test. Sexually transmitted disease (STD) testing. Diabetes screening. This is done by checking your blood sugar (glucose) after you have not eaten for a while (fasting). You may have this done every 1-3 years. Bone density scan. This is done to screen for osteoporosis. You may have this done starting at age 35. Mammogram. This may be done every 1-2 years. Talk to your health care provider about how often you should have regular mammograms. Talk with your health care provider about your test results, treatment options, and if necessary, the need for more tests. Vaccines  Your health care provider may recommend certain vaccines, such as: Influenza vaccine. This is recommended every year. Tetanus, diphtheria, and acellular pertussis (Tdap, Td) vaccine. You may need a Td booster every 10 years. Zoster vaccine. You may need this after age 93. Pneumococcal 13-valent conjugate (PCV13) vaccine. One dose is recommended after age 74. Pneumococcal polysaccharide (PPSV23) vaccine. One dose  is  recommended after age 63. Talk to your health care provider about which screenings and vaccines you need and how often you need them. This information is not intended to replace advice given to you by your health care provider. Make sure you discuss any questions you have with your health care provider. Document Released: 05/30/2015 Document Revised: 01/21/2016 Document Reviewed: 03/04/2015 Elsevier Interactive Patient Education  2017 [place] Prevention in the Home Falls can cause injuries. They can happen to people of all ages. There are many things you can do to make your home safe and to help prevent falls. What can I do on the outside of my home? Regularly fix the edges of walkways and driveways and fix any cracks. Remove anything that might make you trip as you walk through a door, such as a raised step or threshold. Trim any bushes or trees on the path to your home. Use bright outdoor lighting. Clear any walking paths of anything that might make someone trip, such as rocks or tools. Regularly check to see if handrails are loose or broken. Make sure that both sides of any steps have handrails. Any raised decks and porches should have guardrails on the edges. Have any leaves, snow, or ice cleared regularly. Use sand or salt on walking paths during winter. Clean up any spills in your garage right away. This includes oil or grease spills. What can I do in the bathroom? Use night lights. Install grab bars by the toilet and in the tub and shower. Do not use towel bars as grab bars. Use non-skid mats or decals in the tub or shower. If you need to sit down in the shower, use a plastic, non-slip stool. Keep the floor dry. Clean up any water that spills on the floor as soon as it happens. Remove soap buildup in the tub or shower regularly. Attach bath mats securely with double-sided non-slip rug tape. Do not have throw rugs and other things on the floor that can make you  trip. What can I do in the bedroom? Use night lights. Make sure that you have a light by your bed that is easy to reach. Do not use any sheets or blankets that are too big for your bed. They should not hang down onto the floor. Have a firm chair that has side arms. You can use this for support while you get dressed. Do not have throw rugs and other things on the floor that can make you trip. What can I do in the kitchen? Clean up any spills right away. Avoid walking on wet floors. Keep items that you use a lot in easy-to-reach places. If you need to reach something above you, use a strong step stool that has a grab bar. Keep electrical cords out of the way. Do not use floor polish or wax that makes floors slippery. If you must use wax, use non-skid floor wax. Do not have throw rugs and other things on the floor that can make you trip. What can I do with my stairs? Do not leave any items on the stairs. Make sure that there are handrails on both sides of the stairs and use them. Fix handrails that are broken or loose. Make sure that handrails are as long as the stairways. Check any carpeting to make sure that it is firmly attached to the stairs. Fix any carpet that is loose or worn. Avoid having throw rugs at the top or bottom of the stairs.  If you do have throw rugs, attach them to the floor with carpet tape. Make sure that you have a light switch at the top of the stairs and the bottom of the stairs. If you do not have them, ask someone to add them for you. What else can I do to help prevent falls? Wear shoes that: Do not have high heels. Have rubber bottoms. Are comfortable and fit you well. Are closed at the toe. Do not wear sandals. If you use a stepladder: Make sure that it is fully opened. Do not climb a closed stepladder. Make sure that both sides of the stepladder are locked into place. Ask someone to hold it for you, if possible. Clearly mark and make sure that you can  see: Any grab bars or handrails. First and last steps. Where the edge of each step is. Use tools that help you move around (mobility aids) if they are needed. These include: Canes. Walkers. Scooters. Crutches. Turn on the lights when you go into a dark area. Replace any light bulbs as soon as they burn out. Set up your furniture so you have a clear path. Avoid moving your furniture around. If any of your floors are uneven, fix them. If there are any pets around you, be aware of where they are. Review your medicines with your doctor. Some medicines can make you feel dizzy. This can increase your chance of falling. Ask your doctor what other things that you can do to help prevent falls. This information is not intended to replace advice given to you by your health care provider. Make sure you discuss any questions you have with your health care provider. Document Released: 02/27/2009 Document Revised: 10/09/2015 Document Reviewed: 06/07/2014 Elsevier Interactive Patient Education  2017 [employer].

## 2022-02-21 ENCOUNTER — Other Ambulatory Visit: Payer: Self-pay | Admitting: Family Medicine

## 2022-02-21 DIAGNOSIS — Z1211 Encounter for screening for malignant neoplasm of colon: Secondary | ICD-10-CM
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
## 2022-02-23 ENCOUNTER — Ambulatory Visit: Payer: Medicare PPO | Attending: Cardiology | Admitting: Physician Assistant

## 2022-02-23 ENCOUNTER — Telehealth: Payer: Self-pay | Admitting: *Deleted

## 2022-02-23 ENCOUNTER — Encounter: Payer: Self-pay | Admitting: Physician Assistant

## 2022-02-23 ENCOUNTER — Encounter: Payer: Self-pay | Admitting: Internal Medicine

## 2022-02-23 VITALS — BP 140/80 | HR 68 | Ht 67.0 in | Wt 265.0 lb

## 2022-02-23 DIAGNOSIS — I119 Hypertensive heart disease without heart failure: Secondary | ICD-10-CM | POA: Diagnosis not present

## 2022-02-23 DIAGNOSIS — G4730 Sleep apnea, unspecified: Secondary | ICD-10-CM

## 2022-02-23 DIAGNOSIS — I152 Hypertension secondary to endocrine disorders: Secondary | ICD-10-CM

## 2022-02-23 DIAGNOSIS — E1169 Type 2 diabetes mellitus with other specified complication: Secondary | ICD-10-CM | POA: Diagnosis not present

## 2022-02-23 DIAGNOSIS — E1159 Type 2 diabetes mellitus with other circulatory complications: Secondary | ICD-10-CM

## 2022-02-23 DIAGNOSIS — E782 Mixed hyperlipidemia: Secondary | ICD-10-CM

## 2022-02-23 DIAGNOSIS — I10 Essential (primary) hypertension: Secondary | ICD-10-CM | POA: Diagnosis not present

## 2022-02-23 DIAGNOSIS — Z6841 Body Mass Index (BMI) 40.0 and over, adult: Secondary | ICD-10-CM | POA: Diagnosis not present

## 2022-02-23 MED ORDER — LOSARTAN POTASSIUM-HCTZ 100-12.5 MG PO TABS: 1.0000 | ORAL_TABLET | Freq: Every day | ORAL | 3 refills | Status: DC

## 2022-02-23 NOTE — Telephone Encounter (Signed)
Patient had a visit with Nicholes Rough PA-C today and expressed that due to transportation issues, she would like to switch her care to a provider here at the [facility] location. Please advise if okay to switch.  Thanks,  Avice Funchess

## 2022-02-23 NOTE — Patient Instructions (Signed)
Medication Instructions:  Your physician recommends that you continue on your current medications as directed. Please refer to the Current Medication list given to you today.  *If you need a refill on your cardiac medications before your next appointment, please call your pharmacy*   Lab Work: None ordered If you have labs (blood work) drawn today and your tests are completely normal, you will receive your results only by: [facility] (if you have MyChart) OR A paper copy in the mail If you have any lab test that is abnormal or we need to change your treatment, we will call you to review the results.   Follow-Up: At [facility], you and your health needs are our priority.  As part of our continuing mission to provide you with exceptional heart care, we have created designated Provider Care Teams.  These Care Teams include your primary Cardiologist (physician) and Advanced Practice Providers (APPs -  Physician Assistants and Nurse Practitioners) who all work together to provide you with the care you need, when you need it.   Your next appointment:   1 year(s)  The format for your next appointment:   In Person  We have sent a message regarding a provider switch and we will be in contact with you once we hear back.  Important Information About [place]

## 2022-02-27 ENCOUNTER — Other Ambulatory Visit: Payer: Self-pay

## 2022-02-27 ENCOUNTER — Emergency Department (HOSPITAL_COMMUNITY)
Admission: EM | Admit: 2022-02-27 | Discharge: 2022-02-27 | Disposition: A | Discharge status: Home / Self Care | Payer: Medicare PPO | Attending: Emergency Medicine | Admitting: Emergency Medicine

## 2022-02-27 ENCOUNTER — Emergency Department (HOSPITAL_COMMUNITY): Payer: Medicare PPO

## 2022-02-27 ENCOUNTER — Encounter (HOSPITAL_COMMUNITY): Payer: Self-pay | Admitting: Emergency Medicine

## 2022-02-27 DIAGNOSIS — I2510 Atherosclerotic heart disease of native coronary artery without angina pectoris: Secondary | ICD-10-CM | POA: Insufficient documentation

## 2022-02-27 DIAGNOSIS — K449 Diaphragmatic hernia without obstruction or gangrene: Secondary | ICD-10-CM | POA: Diagnosis not present

## 2022-02-27 DIAGNOSIS — Z79899 Other long term (current) drug therapy: Secondary | ICD-10-CM | POA: Diagnosis not present

## 2022-02-27 DIAGNOSIS — M1612 Unilateral primary osteoarthritis, left hip: Secondary | ICD-10-CM | POA: Diagnosis not present

## 2022-02-27 DIAGNOSIS — R109 Unspecified abdominal pain: Secondary | ICD-10-CM | POA: Diagnosis not present

## 2022-02-27 DIAGNOSIS — I509 Heart failure, unspecified: Secondary | ICD-10-CM | POA: Insufficient documentation

## 2022-02-27 DIAGNOSIS — S022XXA Fracture of nasal bones, initial encounter for closed fracture: Secondary | ICD-10-CM

## 2022-02-27 DIAGNOSIS — R58 Hemorrhage, not elsewhere classified: Secondary | ICD-10-CM | POA: Diagnosis not present

## 2022-02-27 DIAGNOSIS — M25531 Pain in right wrist: Secondary | ICD-10-CM | POA: Diagnosis not present

## 2022-02-27 DIAGNOSIS — S0990XA Unspecified injury of head, initial encounter: Secondary | ICD-10-CM | POA: Diagnosis not present

## 2022-02-27 DIAGNOSIS — D1800 Hemangioma unspecified site: Secondary | ICD-10-CM | POA: Diagnosis not present

## 2022-02-27 DIAGNOSIS — M7989 Other specified soft tissue disorders: Secondary | ICD-10-CM | POA: Diagnosis not present

## 2022-02-27 DIAGNOSIS — S0992XA Unspecified injury of nose, initial encounter: Secondary | ICD-10-CM | POA: Diagnosis not present

## 2022-02-27 DIAGNOSIS — R040 Epistaxis: Secondary | ICD-10-CM

## 2022-02-27 DIAGNOSIS — M419 Scoliosis, unspecified: Secondary | ICD-10-CM | POA: Diagnosis not present

## 2022-02-27 DIAGNOSIS — M4316 Spondylolisthesis, lumbar region: Secondary | ICD-10-CM | POA: Diagnosis not present

## 2022-02-27 DIAGNOSIS — S3991XA Unspecified injury of abdomen, initial encounter: Secondary | ICD-10-CM | POA: Diagnosis not present

## 2022-02-27 DIAGNOSIS — S299XXA Unspecified injury of thorax, initial encounter: Secondary | ICD-10-CM | POA: Diagnosis not present

## 2022-02-27 DIAGNOSIS — I110 Hypertensive heart disease with heart failure: Secondary | ICD-10-CM | POA: Diagnosis not present

## 2022-02-27 DIAGNOSIS — Z041 Encounter for examination and observation following transport accident: Secondary | ICD-10-CM | POA: Diagnosis not present

## 2022-02-27 DIAGNOSIS — N281 Cyst of kidney, acquired: Secondary | ICD-10-CM | POA: Diagnosis not present

## 2022-02-27 DIAGNOSIS — I700 Atherosclerosis of aorta: Secondary | ICD-10-CM | POA: Diagnosis not present

## 2022-02-27 LAB — CBC WITH DIFFERENTIAL/PLATELET
Abs Immature Granulocytes: 0.02 10*3/uL (ref 0.00–0.07)
Basophils Absolute: 0.1 10*3/uL (ref 0.0–0.1)
Basophils Relative: 1 %
Eosinophils Absolute: 0.2 10*3/uL (ref 0.0–0.5)
Eosinophils Relative: 2 %
HCT: 44.2 % (ref 36.0–46.0)
Hemoglobin: 14 g/dL (ref 12.0–15.0)
Immature Granulocytes: 0 %
Lymphocytes Relative: 30 %
Lymphs Abs: 1.8 10*3/uL (ref 0.7–4.0)
MCH: 29 pg (ref 26.0–34.0)
MCHC: 31.7 g/dL (ref 30.0–36.0)
MCV: 91.5 fL (ref 80.0–100.0)
Monocytes Absolute: 0.5 10*3/uL (ref 0.1–1.0)
Monocytes Relative: 8 %
Neutro Abs: 3.7 10*3/uL (ref 1.7–7.7)
Neutrophils Relative %: 59 %
Platelets: 224 10*3/uL (ref 150–400)
RBC: 4.83 MIL/uL (ref 3.87–5.11)
RDW: 15.3 % (ref 11.5–15.5)
WBC: 6.2 10*3/uL (ref 4.0–10.5)
nRBC: 0 % (ref 0.0–0.2)

## 2022-02-27 LAB — COMPREHENSIVE METABOLIC PANEL
ALT: 17 U/L (ref 0–44)
AST: 20 U/L (ref 15–41)
Albumin: 3.8 g/dL (ref 3.5–5.0)
Alkaline Phosphatase: 99 U/L (ref 38–126)
Anion gap: 8 (ref 5–15)
BUN: 15 mg/dL (ref 8–23)
CO2: 26 mmol/L (ref 22–32)
Calcium: 9.5 mg/dL (ref 8.9–10.3)
Chloride: 105 mmol/L (ref 98–111)
Creatinine, Ser: 0.76 mg/dL (ref 0.44–1.00)
GFR, Estimated: >60 mL/min (ref >60)
Glucose, Bld: 137 mg/dL — ABNORMAL HIGH (ref 70–99)
Potassium: 3.6 mmol/L (ref 3.5–5.1)
Sodium: 139 mmol/L (ref 135–145)
Total Bilirubin: 0.6 mg/dL (ref 0.3–1.2)
Total Protein: 7.6 g/dL (ref 6.5–8.1)

## 2022-02-27 MED ORDER — LOSARTAN POTASSIUM-HCTZ 100-12.5 MG PO TABS: 1.0000 | ORAL_TABLET | Freq: Every day | ORAL | Status: DC

## 2022-02-27 MED ORDER — METOPROLOL TARTRATE 25 MG PO TABS
50.0000 mg | ORAL_TABLET | Freq: Two times a day (BID) | ORAL | Status: DC
  Administered 2022-02-27: 50 mg via ORAL
  Filled 2022-02-27: qty 2

## 2022-02-27 MED ORDER — HYDROCHLOROTHIAZIDE 12.5 MG PO TABS
12.5000 mg | ORAL_TABLET | Freq: Every day | ORAL | Status: DC
  Administered 2022-02-27: 12.5 mg via ORAL
  Filled 2022-02-27: qty 1

## 2022-02-27 MED ORDER — TRANEXAMIC ACID FOR EPISTAXIS
500.0000 mg | Freq: Once | TOPICAL | Status: DC
  Filled 2022-02-27: qty 10

## 2022-02-27 MED ORDER — OXYMETAZOLINE HCL 0.05 % NA SOLN
1.0000 | Freq: Once | NASAL | Status: AC
  Administered 2022-02-27: 1 via NASAL
  Filled 2022-02-27: qty 30

## 2022-02-27 MED ORDER — IOHEXOL 350 MG/ML SOLN
100.0000 mL | Freq: Once | INTRAVENOUS | Status: AC | PRN
  Administered 2022-02-27: 100 mL via INTRAVENOUS

## 2022-02-27 MED ORDER — LOSARTAN POTASSIUM 50 MG PO TABS
100.0000 mg | ORAL_TABLET | Freq: Every day | ORAL | Status: DC
  Administered 2022-02-27: 100 mg via ORAL
  Filled 2022-02-27: qty 2

## 2022-02-27 MED ORDER — NAPROXEN 500 MG PO TABS: 500.0000 mg | ORAL_TABLET | Freq: Two times a day (BID) | ORAL | 0 refills | Status: DC

## 2022-02-27 MED ORDER — LIDOCAINE-EPINEPHRINE (PF) 2 %-1:200000 IJ SOLN
20.0000 mL | Freq: Once | INTRAMUSCULAR | Status: AC
  Administered 2022-02-27: 20 mL
  Filled 2022-02-27: qty 20

## 2022-02-27 NOTE — Discharge Instructions (Signed)
Thankfully your testing was normal except for a very small broken nose.  This will not need surgery and should heal very well.  If you do find that you are having increasing swelling and pain or deformity your family doctor can refer you to an ear nose and throat doctor though the likelihood of this is very low.  Please take Naprosyn, '500mg'$  by mouth twice daily as needed for pain - this in an antiinflammatory medicine (NSAID) and is similar to ibuprofen - many people feel that it is stronger than ibuprofen and it is easier to take since it is a smaller pill.  Please use this only for 1 week - if your pain persists, you will need to follow up with your doctor in the office for ongoing guidance and pain control.  Thank you for allowing [place] to treat you in the emergency department today.  After reviewing your examination and potential testing that was done it appears that you are safe to go home.  I would like for you to follow-up with your doctor within the next several days, have them obtain your results and follow-up with them to review all of these tests.  If you should develop severe or worsening symptoms return to the emergency department immediately  Rest assured that your CT scans of the brain and the spine your chest lungs and heart all look normal with no signs of internal injuries.  This does not mean you will not hurt in fact you will likely have pain and tingling for the next couple of days.  If you develop severe or worsening pain numbness or weakness please return to the emergency department immediately.

## 2022-02-27 NOTE — ED Notes (Signed)
Pt states area above right thumb on anterior of right hand is sore. Pt has 2+ right radial pulse, cap refill less than 3 sec. Pt ha 4/5 right hand grip
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
## 2022-02-27 NOTE — ED Notes (Signed)
[place] J placed on pt per MD order
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
## 2022-02-27 NOTE — ED Notes (Signed)
Patient transported to CT 
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
## 2022-02-27 NOTE — ED Notes (Signed)
Dr Sabra Heck is aware of pt's elevated bp. I obtained orders for pt's home bp meds that she didn't take today

## 2022-03-01 ENCOUNTER — Telehealth: Payer: Self-pay

## 2022-03-01 NOTE — Telephone Encounter (Signed)
Transition Care Management Follow-up Telephone Call Date of discharge and from where: [facility] 02/27/22 How have you been since you were released from the hospital? [facility] Any questions or concerns? No  Items Reviewed: Did the pt receive and understand the discharge instructions provided? Yes  Medications obtained and verified? Yes  Other? No  Any new allergies since your discharge? No  Dietary orders reviewed? Yes Do you have support at home? Yes   Home Care and Equipment/Supplies: Were home health services ordered? no   Follow up appointments reviewed:  PCP Hospital f/u appt confirmed? No  pt. Stated she is doing ok and doesn't need a f/u at this time.  [facility] f/u appt confirmed? No   Are transportation arrangements needed? No  If their condition worsens, is the pt aware to call PCP or go to the Emergency Dept.? Yes Was the patient provided with contact information for the PCP's office or ED? Yes Was to pt encouraged to call back with questions or concerns? Yes

## 2022-03-08 ENCOUNTER — Other Ambulatory Visit (INDEPENDENT_AMBULATORY_CARE_PROVIDER_SITE_OTHER): Payer: Self-pay | Admitting: Physician Assistant

## 2022-03-08 ENCOUNTER — Encounter: Payer: Self-pay | Admitting: Internal Medicine

## 2022-03-08 DIAGNOSIS — E559 Vitamin D deficiency, unspecified: Secondary | ICD-10-CM

## 2022-03-12 ENCOUNTER — Other Ambulatory Visit: Payer: Self-pay | Admitting: Family Medicine

## 2022-03-26 ENCOUNTER — Other Ambulatory Visit: Payer: Self-pay | Admitting: Family Medicine

## 2022-04-06 NOTE — Telephone Encounter (Signed)
Spoke with patient and made her aware of okay per both physicians to do a provider switch. I informed her that she would receive a letter in the mail when it is time to schedule a one year follow up appointment per last office visit. She states her understanding and agrees with this plan and will call us if she has any further questions or concerns prior to that time.

## 2022-04-15 ENCOUNTER — Other Ambulatory Visit: Payer: Self-pay | Admitting: Family Medicine

## 2022-04-20 ENCOUNTER — Other Ambulatory Visit: Payer: Self-pay | Admitting: Internal Medicine

## 2022-04-20 ENCOUNTER — Ambulatory Visit (AMBULATORY_SURGERY_CENTER): Payer: Medicare PPO

## 2022-04-20 VITALS — Ht 67.0 in | Wt 268.0 lb

## 2022-04-20 DIAGNOSIS — Z86010 Personal history of colonic polyps: Secondary | ICD-10-CM

## 2022-04-20 MED ORDER — NA SULFATE-K SULFATE-MG SULF 17.5-3.13-1.6 GM/177ML PO SOLN: 1.0000 | Freq: Once | ORAL | 0 refills | Status: DC

## 2022-04-20 NOTE — Progress Notes (Signed)

## 2022-04-20 NOTE — Telephone Encounter (Signed)
Returned patient call, advised her prep was changed to Clenpiq due to insurance preference. New instructions mailed to patient.

## 2022-05-06 ENCOUNTER — Encounter: Payer: Medicare PPO | Admitting: Internal Medicine

## 2022-05-11 ENCOUNTER — Other Ambulatory Visit (INDEPENDENT_AMBULATORY_CARE_PROVIDER_SITE_OTHER): Payer: Self-pay | Admitting: Family Medicine

## 2022-05-11 ENCOUNTER — Other Ambulatory Visit (INDEPENDENT_AMBULATORY_CARE_PROVIDER_SITE_OTHER): Payer: Self-pay | Admitting: Physician Assistant

## 2022-05-11 DIAGNOSIS — E559 Vitamin D deficiency, unspecified: Secondary | ICD-10-CM

## 2022-05-11 DIAGNOSIS — E1169 Type 2 diabetes mellitus with other specified complication: Secondary | ICD-10-CM

## 2022-05-25 ENCOUNTER — Other Ambulatory Visit (INDEPENDENT_AMBULATORY_CARE_PROVIDER_SITE_OTHER): Payer: Self-pay | Admitting: Family Medicine

## 2022-05-25 DIAGNOSIS — E1169 Type 2 diabetes mellitus with other specified complication: Secondary | ICD-10-CM

## 2022-07-02 ENCOUNTER — Ambulatory Visit (AMBULATORY_SURGERY_CENTER): Payer: Medicare PPO

## 2022-07-02 VITALS — Ht 67.0 in | Wt 270.0 lb

## 2022-07-02 DIAGNOSIS — Z86010 Personal history of colonic polyps: Secondary | ICD-10-CM

## 2022-07-02 MED ORDER — PEG 3350-KCL-NA BICARB-NACL 420 G PO SOLR: 4000.0000 mL | Freq: Once | ORAL | 0 refills | Status: AC

## 2022-07-02 NOTE — Progress Notes (Signed)
Pre visit completed via phone call; Patient verified name, DOB, and address;  No egg or soy allergy known to patient  No issues known to pt with past sedation with any surgeries or procedures Patient denies ever being told they had issues or difficulty with intubation  No FH of Malignant Hyperthermia Pt is not on diet pills Pt is not on home 02  Pt is not on blood thinners  Pt reports issues with constipation - patient reports she takes a stimulant laxative at least 2 times per week to assist with constipation; patient advised to increase oral fluids, activity, and fruits/veggies that will be allowed the week prior to and during prep; also advised to start Miralax the week of prep; advisement will be placed on instructions;  No A fib or A flutter Have any cardiac testing pending--NO Pt instructed to use Singlecare.com or GoodRx for a price reduction on prep   Insurance verified during [place] appt=[employer]  Patient's chart reviewed by Osvaldo Angst CNRA prior to previsit and patient appropriate for the [place].  Previsit completed and red dot placed by patient's name on their procedure day (on provider's schedule).     Patient reports she does not take medication Orlistat

## 2022-07-07 ENCOUNTER — Telehealth: Payer: Self-pay | Admitting: Family Medicine

## 2022-07-07 NOTE — Telephone Encounter (Signed)
Dedicated [employer] records request forwarded to [employer]

## 2022-07-12 ENCOUNTER — Encounter: Payer: Self-pay | Admitting: Internal Medicine

## 2022-07-15 ENCOUNTER — Encounter: Payer: Medicare PPO | Admitting: Internal Medicine

## 2022-07-27 ENCOUNTER — Ambulatory Visit: Payer: Medicare PPO | Admitting: Family Medicine

## 2022-07-27 ENCOUNTER — Encounter: Payer: Medicare PPO | Admitting: Internal Medicine

## 2022-08-17 ENCOUNTER — Other Ambulatory Visit: Payer: Self-pay | Admitting: Family Medicine

## 2022-08-17 DIAGNOSIS — Z1231 Encounter for screening mammogram for malignant neoplasm of breast: Secondary | ICD-10-CM

## 2022-08-18 ENCOUNTER — Other Ambulatory Visit: Payer: Self-pay | Admitting: Family Medicine

## 2022-08-18 ENCOUNTER — Ambulatory Visit: Payer: Medicare PPO | Admitting: Family Medicine

## 2022-08-18 ENCOUNTER — Encounter: Payer: Self-pay | Admitting: Family Medicine

## 2022-08-18 ENCOUNTER — Telehealth: Payer: Self-pay | Admitting: Family Medicine

## 2022-08-18 VITALS — BP 158/96 | HR 72 | Temp 98.0°F | Resp 16 | Wt 287.4 lb

## 2022-08-18 DIAGNOSIS — N644 Mastodynia: Secondary | ICD-10-CM

## 2022-08-18 MED ORDER — ALBUTEROL SULFATE HFA 108 (90 BASE) MCG/ACT IN AERS: INHALATION_SPRAY | RESPIRATORY_TRACT | 1 refills | Status: DC

## 2022-08-18 MED ORDER — QVAR REDIHALER 80 MCG/ACT IN AERB: 2.0000 | INHALATION_SPRAY | Freq: Two times a day (BID) | RESPIRATORY_TRACT | 5 refills | Status: DC

## 2022-08-18 NOTE — Progress Notes (Signed)
   Subjective:    Patient ID: Sandra Lawrence, female    DOB: June 21, 1945, 77 y.o.   MRN: [id number]  HPI She has a 5-day history of left breast pain.  No history of injury.  She has had previous biopsy on that and her last mammogram was approximately 14 months ago.   Review of Systems     Objective:   Physical Exam Exam of the left breast shows no palpable lesions.  Skin appears normal.  No tenderness.       Assessment & Plan:  Breast pain, left - Plan: MM Digital Screening

## 2022-08-18 NOTE — Telephone Encounter (Signed)
Pt called and is requesting refills on her  Albuterol inhaler and her Qvar inhlaer  Please send to the  [id number] - [place], [address] [address]

## 2022-08-19 NOTE — Telephone Encounter (Signed)
Tried [employer] officer, [employer]. Automated system placed my call on hold. Was on hold for 23 minutes without anyone picking up my call. Will try call back at a different time.

## 2022-08-19 NOTE — Telephone Encounter (Signed)
Pharmacy sent message stating that QVAR is not covered by patient's insurance. Pharmacy recommends changing to   ARNUITY ELLIPTA 100 MCG/ACT AEPB   Please advise on request.

## 2022-08-20 ENCOUNTER — Other Ambulatory Visit: Payer: Self-pay | Admitting: Family Medicine

## 2022-08-20 DIAGNOSIS — N644 Mastodynia: Secondary | ICD-10-CM

## 2022-08-25 MED ORDER — ARNUITY ELLIPTA 100 MCG/ACT IN AEPB: 1.0000 | INHALATION_SPRAY | Freq: Every day | RESPIRATORY_TRACT | 5 refills | Status: DC

## 2022-08-25 NOTE — Addendum Note (Signed)
Addended by: Herminio Commons A on: 08/25/2022 10:18 AM   Modules accepted: Orders

## 2022-09-01 ENCOUNTER — Ambulatory Visit
Admission: RE | Admit: 2022-09-01 | Discharge: 2022-09-01 | Disposition: A | Discharge status: Home / Self Care | Payer: Medicare PPO | Source: Ambulatory Visit | Attending: Family Medicine | Admitting: Family Medicine

## 2022-09-01 ENCOUNTER — Ambulatory Visit: Payer: Medicare PPO

## 2022-10-09 ENCOUNTER — Other Ambulatory Visit: Payer: Self-pay | Admitting: Family Medicine

## 2022-10-15 ENCOUNTER — Other Ambulatory Visit: Payer: Self-pay | Admitting: Family Medicine

## 2022-11-02 ENCOUNTER — Other Ambulatory Visit: Payer: Self-pay | Admitting: Family Medicine

## 2022-11-03 NOTE — Telephone Encounter (Signed)
Is it ok to refill Vesicare? Last refilled 03/26/2022.

## 2022-11-16 NOTE — Progress Notes (Unsigned)
No chief complaint on file.    Patient had been under the care of the [facility] clinic, but hasn't been seen since 11/2021. She had been taking crestor every other day. I believe she is a diabetic, and hasn't had an A1c in over a year. Doesn't appear to currently be on meds for DM (at one point was taking Trulicity, I also see Ozempic and 1 rx for mounjaro in chart)  Lab Results  Component Value Date   HGBA1C 5.9 (H) 10/08/2021   11/2021 [facility]--last visit, visit #15, had lost 34#, down to 252, starting wt 286 in 12/2020.     ASSESSMENT/PLAN:  She has no physical or med check scheduled with [facility], needs.  A1c today Can't still be taking crestor, #90 rx'd 11/2021, takes every other day, would have run out in Feb.

## 2022-11-17 ENCOUNTER — Encounter: Payer: Self-pay | Admitting: Family Medicine

## 2022-11-17 ENCOUNTER — Ambulatory Visit: Payer: Medicare PPO | Admitting: Family Medicine

## 2022-11-17 VITALS — BP 148/90 | HR 68 | Temp 97.9°F | Ht 67.0 in | Wt 298.6 lb

## 2022-11-17 DIAGNOSIS — E1169 Type 2 diabetes mellitus with other specified complication: Secondary | ICD-10-CM | POA: Diagnosis not present

## 2022-11-17 DIAGNOSIS — E1159 Type 2 diabetes mellitus with other circulatory complications: Secondary | ICD-10-CM

## 2022-11-17 DIAGNOSIS — E559 Vitamin D deficiency, unspecified: Secondary | ICD-10-CM

## 2022-11-17 DIAGNOSIS — R42 Dizziness and giddiness: Secondary | ICD-10-CM

## 2022-11-17 DIAGNOSIS — E785 Hyperlipidemia, unspecified: Secondary | ICD-10-CM

## 2022-11-17 DIAGNOSIS — R109 Unspecified abdominal pain: Secondary | ICD-10-CM

## 2022-11-17 DIAGNOSIS — I152 Hypertension secondary to endocrine disorders: Secondary | ICD-10-CM

## 2022-11-17 LAB — POCT URINALYSIS DIP (PROADVANTAGE DEVICE)
Bilirubin, UA: NEGATIVE
Blood, UA: NEGATIVE
Glucose, UA: NEGATIVE mg/dL
Ketones, POC UA: NEGATIVE mg/dL
Leukocytes, UA: NEGATIVE
Nitrite, UA: NEGATIVE
Protein Ur, POC: NEGATIVE mg/dL
Specific Gravity, Urine: 1.01
Urobilinogen, Ur: 0.2
pH, UA: 6 (ref 5.0–8.0)

## 2022-11-17 LAB — POCT GLYCOSYLATED HEMOGLOBIN (HGB A1C): Hemoglobin A1C: 9 % — AB (ref 4.0–5.6)

## 2022-11-17 MED ORDER — BLOOD GLUCOSE TEST VI STRP: 1.0000 | ORAL_STRIP | Freq: Two times a day (BID) | 2 refills | Status: DC

## 2022-11-17 MED ORDER — BLOOD GLUCOSE MONITORING SUPPL DEVI: 1.0000 | Freq: Two times a day (BID) | 0 refills | Status: AC

## 2022-11-17 MED ORDER — SEMAGLUTIDE(0.25 OR 0.5MG/DOS) 2 MG/3ML ~~LOC~~ SOPN: 0.2500 mg | PEN_INJECTOR | SUBCUTANEOUS | 1 refills | Status: DC

## 2022-11-17 MED ORDER — LANCETS MISC. MISC: 1.0000 | Freq: Two times a day (BID) | 2 refills | Status: AC

## 2022-11-17 MED ORDER — LANCET DEVICE MISC: 1.0000 | Freq: Three times a day (TID) | 0 refills | Status: AC

## 2022-11-17 NOTE — Patient Instructions (Addendum)
  You need to cut out the sugar from your beverages. Please drink mainly water. Do not drink sweet tea (cut back to 1/2 and 1/2, and eventually try and switch to unsweetened, vs skipping tea entirely, and having more water).  Cut back on the bread in your diet. Try and eat more vegetables, salads, lean proteins (don't eat fried foods, greasy/fatty foods). Eat more chicken, fish, plant-based proteins (beans, lentils).  Please check your sugars first thing in the morning. Some days also check it 2 hours after dinner or at bedtime. Keep a record of your sugars---use columns for the date, morning, and evening sugars, and a separate column for "comments"--this is where you will explain why your number might be higher or lower than usual (if you had candy, sewtt tea, if it was lower because you exercised).  This might help you see patterns to know which foods to avoid, and what helps your sugar be better (exercise). You can use the paper provided (modified from a blood pressure log).  Start ozempic, once weekly. This can use a lot of nausea if  you overeat. Be sure to eat small amounts at a time.  Please cut back on the sodium in your diet.   Avoid processed foods such as hot dogs, bacon, salami, sausage, lunch meats. Avoid canned foods. Read food labels to limit the sodium in the diet. Daily exercise will help keep both the blood pressure and the blood sugar down. Try and exercise at least 10-15 minutes at a time, with a goal of 30 minutes most days. These can be seated exercises if it hurts your back to much to stand/walk.  Cut back on caffeine, citrus, large meals, spicy food.  Wait at least 2 hours after eating before laying down. These measures can help reduce reflux. Given that you are needing Tums every day, I recommend switching to Prilosec OTC once daily, taken 30 minutes before a meal, and take this for 2 weeks.  Hopefully, after 2 weeks of taking this regularly, along with dietary  changes, you may not need to continue this medication. If you do need a reflux medication longterm, Pepcid (famotidine) is a better choice to use long-term.  You can also get this over-the-counter.  If this isn't working, discuss with Dr. Susann Givens at your follow-up visit, and we can prescribe medications.

## 2022-11-19 LAB — COMPREHENSIVE METABOLIC PANEL
ALT: 17 IU/L (ref 0–32)
AST: 17 IU/L (ref 0–40)
Albumin: 3.9 g/dL (ref 3.8–4.8)
Alkaline Phosphatase: 112 IU/L (ref 44–121)
BUN/Creatinine Ratio: 18 (ref 12–28)
BUN: 13 mg/dL (ref 8–27)
Bilirubin Total: 0.4 mg/dL (ref 0.0–1.2)
CO2: 26 mmol/L (ref 20–29)
Calcium: 9.6 mg/dL (ref 8.7–10.3)
Chloride: 101 mmol/L (ref 96–106)
Creatinine, Ser: 0.73 mg/dL (ref 0.57–1.00)
Globulin, Total: 2.9 g/dL (ref 1.5–4.5)
Glucose: 160 mg/dL — ABNORMAL HIGH (ref 70–99)
Potassium: 4.2 mmol/L (ref 3.5–5.2)
Sodium: 139 mmol/L (ref 134–144)
Total Protein: 6.8 g/dL (ref 6.0–8.5)
eGFR: 86 mL/min/{1.73_m2} (ref >59)

## 2022-11-19 LAB — CBC WITH DIFFERENTIAL/PLATELET
Basophils Absolute: 0.1 10*3/uL (ref 0.0–0.2)
Basos: 1 %
EOS (ABSOLUTE): 0.2 10*3/uL (ref 0.0–0.4)
Eos: 4 %
Hematocrit: 40.6 % (ref 34.0–46.6)
Hemoglobin: 13.2 g/dL (ref 11.1–15.9)
Immature Grans (Abs): 0 10*3/uL (ref 0.0–0.1)
Immature Granulocytes: 0 %
Lymphocytes Absolute: 1.8 10*3/uL (ref 0.7–3.1)
Lymphs: 39 %
MCH: 28.1 pg (ref 26.6–33.0)
MCHC: 32.5 g/dL (ref 31.5–35.7)
MCV: 87 fL (ref 79–97)
Monocytes Absolute: 0.4 10*3/uL (ref 0.1–0.9)
Monocytes: 9 %
Neutrophils Absolute: 2.1 10*3/uL (ref 1.4–7.0)
Neutrophils: 47 %
Platelets: 170 10*3/uL (ref 150–450)
RBC: 4.69 x10E6/uL (ref 3.77–5.28)
RDW: 13.2 % (ref 11.7–15.4)
WBC: 4.6 10*3/uL (ref 3.4–10.8)

## 2022-11-19 LAB — LIPID PANEL
Chol/HDL Ratio: 2.9 ratio (ref 0.0–4.4)
Cholesterol, Total: 153 mg/dL (ref 100–199)
HDL: 53 mg/dL (ref >39)
LDL Chol Calc (NIH): 82 mg/dL (ref 0–99)
Triglycerides: 95 mg/dL (ref 0–149)
VLDL Cholesterol Cal: 18 mg/dL (ref 5–40)

## 2022-11-19 LAB — MICROALBUMIN / CREATININE URINE RATIO
Creatinine, Urine: 126.2 mg/dL
Microalb/Creat Ratio: 18 mg/g creat (ref 0–29)
Microalbumin, Urine: 22.7 ug/mL

## 2022-11-19 LAB — TSH: TSH: 1.51 u[IU]/mL (ref 0.450–4.500)

## 2022-11-19 LAB — VITAMIN D 25 HYDROXY (VIT D DEFICIENCY, FRACTURES): Vit D, 25-Hydroxy: 42.3 ng/mL (ref 30.0–100.0)

## 2022-11-23 ENCOUNTER — Emergency Department (HOSPITAL_BASED_OUTPATIENT_CLINIC_OR_DEPARTMENT_OTHER): Payer: Medicare PPO | Admitting: Radiology

## 2022-11-23 ENCOUNTER — Encounter (HOSPITAL_BASED_OUTPATIENT_CLINIC_OR_DEPARTMENT_OTHER): Payer: Self-pay

## 2022-11-23 ENCOUNTER — Other Ambulatory Visit: Payer: Self-pay

## 2022-11-23 ENCOUNTER — Emergency Department (HOSPITAL_BASED_OUTPATIENT_CLINIC_OR_DEPARTMENT_OTHER)
Admission: EM | Admit: 2022-11-23 | Discharge: 2022-11-23 | Disposition: A | Discharge status: Home / Self Care | Payer: Medicare PPO | Attending: Emergency Medicine | Admitting: Emergency Medicine

## 2022-11-23 DIAGNOSIS — Z79899 Other long term (current) drug therapy: Secondary | ICD-10-CM | POA: Insufficient documentation

## 2022-11-23 DIAGNOSIS — Z23 Encounter for immunization: Secondary | ICD-10-CM | POA: Diagnosis not present

## 2022-11-23 DIAGNOSIS — J45909 Unspecified asthma, uncomplicated: Secondary | ICD-10-CM | POA: Insufficient documentation

## 2022-11-23 DIAGNOSIS — W540XXA Bitten by dog, initial encounter: Secondary | ICD-10-CM

## 2022-11-23 DIAGNOSIS — S81852A Open bite, left lower leg, initial encounter: Secondary | ICD-10-CM | POA: Insufficient documentation

## 2022-11-23 DIAGNOSIS — E119 Type 2 diabetes mellitus without complications: Secondary | ICD-10-CM | POA: Diagnosis not present

## 2022-11-23 DIAGNOSIS — Z87891 Personal history of nicotine dependence: Secondary | ICD-10-CM | POA: Insufficient documentation

## 2022-11-23 DIAGNOSIS — I509 Heart failure, unspecified: Secondary | ICD-10-CM | POA: Diagnosis not present

## 2022-11-23 DIAGNOSIS — I110 Hypertensive heart disease with heart failure: Secondary | ICD-10-CM | POA: Diagnosis not present

## 2022-11-23 MED ORDER — TETANUS-DIPHTH-ACELL PERTUSSIS 5-2.5-18.5 LF-MCG/0.5 IM SUSY
0.5000 mL | PREFILLED_SYRINGE | Freq: Once | INTRAMUSCULAR | Status: AC
  Administered 2022-11-23: 0.5 mL via INTRAMUSCULAR
  Filled 2022-11-23: qty 0.5

## 2022-11-23 MED ORDER — AMOXICILLIN-POT CLAVULANATE 875-125 MG PO TABS
1.0000 | ORAL_TABLET | Freq: Once | ORAL | Status: AC
  Administered 2022-11-23: 1 via ORAL
  Filled 2022-11-23: qty 1

## 2022-11-23 MED ORDER — OXIDIZED CELLULOSE EX PADS
2.0000 | MEDICATED_PAD | Freq: Once | CUTANEOUS | Status: AC
  Administered 2022-11-23: 2 via TOPICAL
  Filled 2022-11-23: qty 2

## 2022-11-23 MED ORDER — MUPIROCIN 2 % EX OINT: 1.0000 | TOPICAL_OINTMENT | Freq: Two times a day (BID) | CUTANEOUS | 0 refills | Status: DC

## 2022-11-23 MED ORDER — LIDOCAINE-EPINEPHRINE (PF) 2 %-1:200000 IJ SOLN
10.0000 mL | Freq: Once | INTRAMUSCULAR | Status: AC
  Administered 2022-11-23: 10 mL
  Filled 2022-11-23: qty 20

## 2022-11-23 MED ORDER — AMOXICILLIN-POT CLAVULANATE 875-125 MG PO TABS: 1.0000 | ORAL_TABLET | Freq: Two times a day (BID) | ORAL | 0 refills | Status: DC

## 2022-11-23 NOTE — ED Provider Notes (Signed)
[place] [facility] DEPARTMENT AT [facility] Provider Note   CSN: [id number] Arrival date & time: 11/23/22  1354     History  Chief Complaint  Patient presents with   Animal Bite    Sandra Lawrence is a 77 y.o. female.   Animal Bite   77 year old female presents emergency department with complaints of animal bite.  Patient states that she was visiting her daughter who was dog recently had puppies this morning.  States that when she turned around, dog bit her on the back of her left leg as well as area beneath her right breast.  Unsure of last tetanus vaccination.  Patient states that dog was up-to-date on rabies and other vaccination series.  Denies any weakness or sensory deficits in affected foot.  Presented the emergency department due to continued bleeding as well as concern for possible developing infection if left untreated.  Otherwise, patient without injury from incident earlier.  Past medical history significant for CHF, GERD, hyperlipidemia, OSA, hypercholesterolemia, diabetes mellitus, asthma, of a PE, hypertension,  Home Medications Prior to Admission medications   Medication Sig Start Date End Date Taking? Authorizing Provider  amoxicillin-clavulanate (AUGMENTIN) 875-125 MG tablet Take 1 tablet by mouth every 12 (twelve) hours. 11/23/22  Yes Sherian Maroon A, PA  mupirocin ointment (BACTROBAN) 2 % Apply 1 Application topically 2 (two) times daily. 11/23/22  Yes Sherian Maroon A, PA  acetaminophen (TYLENOL) 500 MG tablet Take 2 tablets (1,000 mg total) by mouth 3 (three) times daily as needed for mild pain, moderate pain, fever or headache. Patient not taking: Reported on 11/17/2022 07/24/21   Chadwell, Ivin Booty, PA-C  albuterol (VENTOLIN HFA) 108 (90 Base) MCG/ACT inhaler INHALE 1-2 PUFFS BY MOUTH EVERY 6 HOURS AS NEEDED FOR WHEEZE OR SHORTNESS OF BREATH Patient not taking: Reported on 11/17/2022 10/15/22   Ronnald Nian, MD  Beclomethasone Dipropionate (QVAR IN) Inhale  2 puffs into the lungs daily as needed (Asthma). Patient not taking: Reported on 11/17/2022    [provider]  Blood Glucose Monitoring Suppl DEVI 1 each by Does not apply route in the morning and at bedtime. May substitute to any manufacturer covered by patient's insurance. 11/17/22   Joselyn Arrow, MD  calcium carbonate (TUMS - DOSED IN MG ELEMENTAL CALCIUM) 500 MG chewable tablet Chew 3-4 tablets by mouth daily.    [provider]  Fluticasone Furoate (ARNUITY ELLIPTA) 100 MCG/ACT AEPB Inhale 1 Dose into the lungs daily. Patient not taking: Reported on 11/17/2022 08/25/22   Ronnald Nian, MD  glucose blood (ACCU-CHEK GUIDE) test strip Use as instructed Patient not taking: Reported on 11/17/2022 03/10/21   Ronnald Nian, MD  Glucose Blood (BLOOD GLUCOSE TEST STRIPS) STRP 1 each by In Vitro route in the morning and at bedtime. May substitute to any manufacturer covered by patient's insurance. 11/17/22 08/14/23  Joselyn Arrow, MD  Lancet Device MISC 1 each by Does not apply route in the morning, at noon, and at bedtime. May substitute to any manufacturer covered by patient's insurance. 11/17/22 12/17/22  Joselyn Arrow, MD  Lancets Misc. MISC 1 each by Does not apply route in the morning and at bedtime. May substitute to any manufacturer covered by patient's insurance. 11/17/22 12/17/22  Joselyn Arrow, MD  losartan-hydrochlorothiazide (HYZAAR) 100-12.5 MG tablet Take 1 tablet by mouth daily. 02/23/22   Sharlene Dory, PA-C  metoprolol tartrate (LOPRESSOR) 50 MG tablet TAKE 1 TABLET BY MOUTH TWICE A DAY 11/03/22   Ronnald Nian, MD  Multiple Vitamins-Minerals (CENTRUM SILVER PO) Take 1 capsule by mouth daily.    [provider]  naproxen (NAPROSYN) 500 MG tablet Take 1 tablet (500 mg total) by mouth 2 (two) times daily with a meal. Patient not taking: Reported on 11/17/2022 02/27/22   Eber Hong, MD  nitroGLYCERIN (NITROSTAT) 0.4 MG SL tablet PLACE 1 TABLET (0.4 MG TOTAL) UNDER THE TONGUE EVERY 5  (FIVE) MINUTES AS NEEDED FOR CHEST PAIN. Patient not taking: Reported on 11/17/2022 06/06/19   Ronnald Nian, MD  rosuvastatin (CRESTOR) 20 MG tablet Take 1 tablet (20 mg total) by mouth at bedtime. 11/23/21   Thomasene Lot, DO  Semaglutide,0.25 or 0.5MG /DOS, 2 MG/3ML SOPN Inject 0.25 mg into the skin once a week. Increase to 0.5 mg dose after 4 weeks 11/17/22   Joselyn Arrow, MD  solifenacin (VESICARE) 10 MG tablet TAKE 1 TABLET BY MOUTH EVERY DAY 11/03/22   Ronnald Nian, MD  WEEKLY-D 1.25 MG (50000 UT) capsule Take 50,000 Units by mouth once a week. 06/23/22   [provider]      Allergies    Patient has no known allergies.    Review of Systems   Review of Systems  Physical Exam Updated Vital Signs BP (!) 188/85   Pulse 63   Temp 98.4 F (36.9 C) (Temporal)   Resp 18   Ht 5\' 7"  (1.702 m)   Wt 135.2 kg   SpO2 100%   BMI 46.67 kg/m  Physical Exam Vitals and nursing note reviewed. Exam conducted with a chaperone present.  Constitutional:      General: She is not in acute distress.    Appearance: She is well-developed.  HENT:     Head: Normocephalic and atraumatic.  Eyes:     Conjunctiva/sclera: Conjunctivae normal.  Cardiovascular:     Rate and Rhythm: Normal rate and regular rhythm.  Pulmonary:     Effort: Pulmonary effort is normal. No respiratory distress.     Breath sounds: Normal breath sounds.  Abdominal:     Palpations: Abdomen is soft.     Tenderness: There is no abdominal tenderness.  Musculoskeletal:        General: No swelling.     Cervical back: Neck supple.     Comments: Patient with puncture wound noted on the dorsal aspect of left foot measuring approximately 2.4 cm in length with separation of approximately 0.5 to 1 cm at greatest width.  Active bleeding appreciated initially with trickling type blood.  No obvious foreign body.  Overlying skin of wound seem to be avulsed from bite injury.  2 additional superficial wounds noted on either side of main  wound on left posterior leg measuring approximately 1 cm in length and more consistent with abrasions.  Patient also with 2-3 small 1 to 1.5 cm abrasions on the lower aspect of right breast.  Patient full range of motion of left knee, ankle, digits.  Pedal pulses 2+ bilaterally.  Skin:    General: Skin is warm and dry.     Capillary Refill: Capillary refill takes less than 2 seconds.  Neurological:     Mental Status: She is alert.  Psychiatric:        Mood and Affect: Mood normal.     ED Results / Procedures / Treatments   Labs (all labs ordered are listed, but only abnormal results are displayed) Labs Reviewed - No data to display  EKG None  Radiology DG Tibia/Fibula Left  Result Date: 11/23/2022 CLINICAL DATA:  Animal bite to the left lower extremity. EXAM: LEFT TIBIA AND FIBULA - 2 VIEW COMPARISON:  None Available. FINDINGS: There is a total left knee arthroplasty. The arthroplasty components appear intact and in anatomic alignment. There is no acute fracture or dislocation. The bones are osteopenic. Laceration of the soft tissues of the posterior calf with small pockets of air. Overlying bandage noted. No radiopaque foreign object. IMPRESSION: 1. No acute fracture or dislocation. 2. Soft tissue laceration. Electronically Signed   By: Elgie Collard M.D.   On: 11/23/2022 15:16    Procedures Procedures    Medications Ordered in ED Medications  oxidized cellulose (Surgicel) pad 2 each (2 each Topical Given by Other 11/23/22 1701)  lidocaine-EPINEPHrine (XYLOCAINE W/EPI) 2 %-1:200000 (PF) injection 10 mL (10 mLs Infiltration Given by Other 11/23/22 1701)  Tdap (BOOSTRIX) injection 0.5 mL (0.5 mLs Intramuscular Given 11/23/22 1701)  amoxicillin-clavulanate (AUGMENTIN) 875-125 MG per tablet 1 tablet (1 tablet Oral Given 11/23/22 1703)    ED Course/ Medical Decision Making/ A&P                             Medical Decision Making Risk Prescription drug management.   This patient  presents to the ED for concern of dog bite, this involves an extensive number of treatment options, and is a complaint that carries with it a high risk of complications and morbidity.  The differential diagnosis includes fracture, dislocation, foreign body retention, neurovascular compromise   Co morbidities that complicate the patient evaluation  See HPI   Additional history obtained:  Additional history obtained from EMR External records from outside source obtained and reviewed including hospital records   Lab Tests:  N/a   Imaging Studies ordered:  I ordered imaging studies including left hip/fib x-ray I independently visualized and interpreted imaging which showed no acute osseous abnormality.  Superficial laceration I agree with the radiologist interpretation   Cardiac Monitoring: / EKG:  The patient was maintained on a cardiac monitor.  I personally viewed and interpreted the cardiac monitored which showed an underlying rhythm of: Sinus rhythm   Consultations Obtained:  N/a   Problem List / ED Course / Critical interventions / Medication management  Dog bite/laceration I ordered medication including Surgicel, Tdap, Xylocaine, Augmentin   Reevaluation of the patient after these medicines showed that the patient improved I have reviewed the patients home medicines and have made adjustments as needed   Social Determinants of Health:  Former cigarette use.  Denies illicit drug use.   Test / Admission - Considered:  Dog bite/laceration Vitals signs significant for hypertension. Otherwise within normal range and stable throughout visit. Imaging studies significant for: See above 77 year old female presents emergency department after dog bite suffered to her right posterior leg.  No obvious tenderness injury on exam without obvious foreign body retention.  X-ray imaging was obtained by triage staff for assessment of possible acute bony injury of which was  negative.  Largest of small wounds on posterior leg with avulsion of soft tissue from bite wound left open for healing from secondary intention; considered closure but due to relatively small size of wound with soft tissue removed from bite, closure was foregone.  Patient's superficial wounds cleaned with ChloraPrep and placed antibiotic ointment with sterile dressing nonadherent.  Will place patient on empiric antibiotics in the form of Augmentin and recommend follow-up with primary care within 2 to 3 days for wound reassessment.  Tdap also updated.  Patient educated regarding proper wound care at home.  Treatment plan discussed at length with patient and she knowledge understanding was agreeable to said plan.  Patient overall well-appearing, afebrile in no acute distress. Worrisome signs and symptoms were discussed with the patient, and the patient acknowledged understanding to return to the ED if noticed. Patient was stable upon discharge.          Final Clinical Impression(s) / ED Diagnoses Final diagnoses:  Dog bite, initial encounter    Rx / DC Orders ED Discharge Orders          Ordered    amoxicillin-clavulanate (AUGMENTIN) 875-125 MG tablet  Every 12 hours        11/23/22 1740    mupirocin ointment (BACTROBAN) 2 %  2 times daily        11/23/22 1740              Peter Garter, [place] 11/23/22 Donavan Burnet, MD 11/25/22 1402

## 2022-11-23 NOTE — ED Triage Notes (Signed)
Patient here POV from Home.  Endorses Dog Attack today PTA. Rabies Vaccinations UTD. Unsure of Tetanus Date. Deep Puncture Wound to left Lower Posterior Wound. Abrasions to Right Breast.   NAD Noted during Triage. A&Ox4. GCS 15. BIB Wheelchair.

## 2022-11-23 NOTE — Discharge Instructions (Addendum)
As discussed, recommend daily bandage changes of your leg.  Apply antibiotic ointment to superficial wounds on your leg as well as beneath your right breast.  Take antibiotics as prescribed.  Wash area gently with warm soapy water and avoid submersion in lakes, pools, baths, oceans or other bodies of water.  Recommend follow-up with primary care for reassessment of your wounds within 2 to 3 days.  Please not hesitate to return to emergency department if the worrisome signs and symptoms we discussed become apparent.

## 2022-11-23 NOTE — ED Notes (Signed)
Pt verbalized understanding of discharge instructions. Opportunity for questions provided.  

## 2022-12-01 ENCOUNTER — Encounter: Payer: Self-pay | Admitting: Family Medicine

## 2022-12-01 ENCOUNTER — Ambulatory Visit: Payer: Medicare PPO | Admitting: Family Medicine

## 2022-12-01 VITALS — BP 136/88 | HR 84 | Temp 98.5°F | Ht 67.0 in | Wt 296.2 lb

## 2022-12-01 DIAGNOSIS — E1169 Type 2 diabetes mellitus with other specified complication: Secondary | ICD-10-CM

## 2022-12-01 DIAGNOSIS — I152 Hypertension secondary to endocrine disorders: Secondary | ICD-10-CM

## 2022-12-01 DIAGNOSIS — W540XXD Bitten by dog, subsequent encounter: Secondary | ICD-10-CM | POA: Diagnosis not present

## 2022-12-01 DIAGNOSIS — E1159 Type 2 diabetes mellitus with other circulatory complications: Secondary | ICD-10-CM | POA: Diagnosis not present

## 2022-12-01 NOTE — Progress Notes (Signed)
Chief Complaint  Patient presents with   Follow-up    Patient is here for recheck on two dog bites. One on right breast area and the other on lower left leg area on back area. Lower leg area stings once in a while. Was seen at [facility].     Went to ER 7/9 for evaluation of dog bites.  She was visiting her daughter whose dog recently had puppies. The dog bit her on the back of her left leg as well as area beneath her right breast. Patient states that dog was up-to-date on rabies and other vaccination series.  TdaP given, along with Rx for Augmentin. Surgicel applied to wound.  She has occasional stinging sensation on the back of the L calf, where there is still a hole. The R breast is bruised, is a little sore to touch, but reports it is healing. She is cleaning it daily, and covering with nonadhesive gauze.   DM--was only able to get her first dose of Ozempic on Monday.  Denies SE. Just got her glucometer this weekend.  154, 144, 134, 134.  Last night 138; 139 this morning. Has f/u with Dr. Susann Givens 8/6 Cut back on hot dogs.  Lab Results  Component Value Date   HGBA1C 9.0 (A) 11/17/2022   BP Readings from Last 3 Encounters:  12/01/22 136/88  11/23/22 (!) 188/85  11/17/22 (!) 148/90     PMH, PSH, SH reviewed  Outpatient Encounter Medications as of 12/01/2022  Medication Sig Note   amoxicillin-clavulanate (AUGMENTIN) 875-125 MG tablet Take 1 tablet by mouth every 12 (twelve) hours. 12/01/2022: Took last one this am   Blood Glucose Monitoring Suppl DEVI 1 each by Does not apply route in the morning and at bedtime. May substitute to any manufacturer covered by patient's insurance.    calcium carbonate (TUMS - DOSED IN MG ELEMENTAL CALCIUM) 500 MG chewable tablet Chew 3-4 tablets by mouth daily.    glucose blood (ACCU-CHEK GUIDE) test strip Use as instructed    Glucose Blood (BLOOD GLUCOSE TEST STRIPS) STRP 1 each by In Vitro route in the morning and at bedtime. May  substitute to any manufacturer covered by patient's insurance.    Lancet Device MISC 1 each by Does not apply route in the morning, at noon, and at bedtime. May substitute to any manufacturer covered by patient's insurance.    Lancets Misc. MISC 1 each by Does not apply route in the morning and at bedtime. May substitute to any manufacturer covered by patient's insurance.    losartan-hydrochlorothiazide (HYZAAR) 100-12.5 MG tablet Take 1 tablet by mouth daily.    metoprolol tartrate (LOPRESSOR) 50 MG tablet TAKE 1 TABLET BY MOUTH TWICE A DAY    mupirocin ointment (BACTROBAN) 2 % Apply 1 Application topically 2 (two) times daily. 12/01/2022: Used yesterday(uses every evening)   rosuvastatin (CRESTOR) 20 MG tablet Take 1 tablet (20 mg total) by mouth at bedtime. 12/01/2022: She believes dose was lowered to 10mg , and is taking it every day (had been taking 20 mg every other day), changed by another provider outside of [facility]     Semaglutide,0.25 or 0.5MG /DOS, 2 MG/3ML SOPN Inject 0.25 mg into the skin once a week. Increase to 0.5 mg dose after 4 weeks 12/01/2022: Took first dose Monday   solifenacin (VESICARE) 10 MG tablet TAKE 1 TABLET BY MOUTH EVERY DAY    WEEKLY-D 1.25 MG (50000 UT) capsule Take 50,000 Units by mouth once a week.    acetaminophen (  TYLENOL) 500 MG tablet Take 2 tablets (1,000 mg total) by mouth 3 (three) times daily as needed for mild pain, moderate pain, fever or headache. (Patient not taking: Reported on 11/17/2022) 12/01/2022: As needed   albuterol (VENTOLIN HFA) 108 (90 Base) MCG/ACT inhaler INHALE 1-2 PUFFS BY MOUTH EVERY 6 HOURS AS NEEDED FOR WHEEZE OR SHORTNESS OF BREATH (Patient not taking: Reported on 11/17/2022) 12/01/2022: As needed   Beclomethasone Dipropionate (QVAR IN) Inhale 2 puffs into the lungs daily as needed (Asthma). (Patient not taking: Reported on 11/17/2022) 11/17/2022: Ran out   Fluticasone Furoate (ARNUITY ELLIPTA) 100 MCG/ACT AEPB Inhale 1 Dose into the lungs daily.  (Patient not taking: Reported on 11/17/2022) 11/17/2022: Does not have   Multiple Vitamins-Minerals (CENTRUM SILVER PO) Take 1 capsule by mouth daily. (Patient not taking: Reported on 12/01/2022) 12/01/2022: Ran out   naproxen (NAPROSYN) 500 MG tablet Take 1 tablet (500 mg total) by mouth 2 (two) times daily with a meal. (Patient not taking: Reported on 11/17/2022) 12/01/2022: As needed   nitroGLYCERIN (NITROSTAT) 0.4 MG SL tablet PLACE 1 TABLET (0.4 MG TOTAL) UNDER THE TONGUE EVERY 5 (FIVE) MINUTES AS NEEDED FOR CHEST PAIN. (Patient not taking: Reported on 11/17/2022) 12/01/2022: As needed   No facility-administered encounter medications on file as of 12/01/2022.   No Known Allergies   ROS: no fever, chills, bleeding, rash, URI symptoms. No n/v/d. Some swelling of the left leg, fluctuates up and down.    PHYSICAL EXAM:  BP 136/88   Pulse 84   Temp 98.5 F (36.9 C) (Tympanic)   Ht 5\' 7"  (1.702 m)   Wt 296 lb 3.2 oz (134.4 kg)   BMI 46.39 kg/m   Well-appearing, pleasant female, in no distress R breast: Some healing superificial abrasions within a large bruise just lateral to the areola of the right breast. No drainage/crusting, erythema, warmth.  Nontender  Left posterior calf-- Largest wound is medially, measuring 2cm in length with up to 1 cm in width. Base of the wound appears clean. There is no surrounding erythema, no purulent drainage.  There is a much smaller superficial abrasion/wound that is 0.5 cm in size. The other areas of the skin are almost healed, very superficial. Trace pitting edema at the mid-calf and ankle of LLE. No pitting edema on the RLE  On bandage she had been wearing, there is some BRB and slight serosanguinous drainage   ASSESSMENT/PLAN:  Dog bite, subsequent encounter - date of injury 7/9.  No evidence of infection. Reviewed proper wound care.  Elevation and compression recommended.  ACE wrap applied  Type 2 diabetes mellitus with other specified complication,  without long-term current use of insulin (HCC) - continue ozempic weekly as directed. Cont to monitor sugars, and bring list to her f/u visit with PCP  Hypertension associated with type 2 diabetes mellitus (HCC) - BP improved, still elevated.  Continue low sodium diet.  Wound is without infection, left to heal by secondary intention. Discussed leg elevation to decrease swelling. Wound was dressed with antibacterial ointment and nonadherent gauze, f/b ACE wrap.  F/u as scheduled with Dr. Susann Givens next month, sooner if concerns for infection. Concerning symptoms were reviewed with pt. To continue to monitor sugars and take ozempic. BP improved from last visit, still a little high.  I spent 32 minutes dedicated to the care of this patient, including pre-visit review of records, face to face time, post-visit ordering of testing and documentation.   Continue to keep the wounds clean (wash with  soap and water). Continue to use antibacterial ointment until the areas have healed (to the scabs on the breast and to the wounds on the left leg). There is some swelling to the left leg.  Please try and keep it elevated when you can. If you cannot wear a compression sock, you can try some compression from an ACE bandage.\ Continue to limit the salt in your diet.  Recheck with your next visit with Dr. Susann Givens in August. Return sooner if you develop increase in pain, if the size of the wound is getting larger, if you have redness, worsening swelling, and crusting or drainage, or if you develop a fever. Your sugars going up high can also indicate an infection. Continue to monitor your sugars (and bring the list to your appointment with Dr. Susann Givens).

## 2022-12-01 NOTE — Patient Instructions (Signed)
  Continue to keep the wounds clean (wash with soap and water). Continue to use antibacterial ointment until the areas have healed (to the scabs on the breast and to the wounds on the left leg). There is some swelling to the left leg.  Please try and keep it elevated when you can. If you cannot wear a compression sock, you can try some compression from an ACE bandage.\ Continue to limit the salt in your diet.  Recheck with your next visit with Dr. Susann Lawrence in August. Return sooner if you develop increase in pain, if the size of the wound is getting larger, if you have redness, worsening swelling, and crusting or drainage, or if you develop a fever. Your sugars going up high can also indicate an infection. Continue to monitor your sugars (and bring the list to your appointment with Dr. Susann Lawrence).

## 2022-12-03 ENCOUNTER — Encounter: Payer: Self-pay | Admitting: Internal Medicine

## 2022-12-21 ENCOUNTER — Ambulatory Visit: Payer: Medicare PPO | Admitting: Family Medicine

## 2022-12-21 ENCOUNTER — Encounter: Payer: Self-pay | Admitting: Family Medicine

## 2022-12-21 VITALS — BP 132/86 | HR 70 | Ht 67.0 in | Wt 291.2 lb

## 2022-12-21 DIAGNOSIS — N183 Chronic kidney disease, stage 3 unspecified: Secondary | ICD-10-CM | POA: Diagnosis not present

## 2022-12-21 DIAGNOSIS — F32A Depression, unspecified: Secondary | ICD-10-CM

## 2022-12-21 DIAGNOSIS — I2510 Atherosclerotic heart disease of native coronary artery without angina pectoris: Secondary | ICD-10-CM

## 2022-12-21 DIAGNOSIS — J4530 Mild persistent asthma, uncomplicated: Secondary | ICD-10-CM

## 2022-12-21 DIAGNOSIS — E1136 Type 2 diabetes mellitus with diabetic cataract: Secondary | ICD-10-CM

## 2022-12-21 DIAGNOSIS — N3281 Overactive bladder: Secondary | ICD-10-CM

## 2022-12-21 DIAGNOSIS — I509 Heart failure, unspecified: Secondary | ICD-10-CM | POA: Diagnosis not present

## 2022-12-21 DIAGNOSIS — K2100 Gastro-esophageal reflux disease with esophagitis, without bleeding: Secondary | ICD-10-CM

## 2022-12-21 DIAGNOSIS — E1169 Type 2 diabetes mellitus with other specified complication: Secondary | ICD-10-CM | POA: Diagnosis not present

## 2022-12-21 DIAGNOSIS — Z96651 Presence of right artificial knee joint: Secondary | ICD-10-CM

## 2022-12-21 DIAGNOSIS — I119 Hypertensive heart disease without heart failure: Secondary | ICD-10-CM

## 2022-12-21 DIAGNOSIS — D126 Benign neoplasm of colon, unspecified: Secondary | ICD-10-CM

## 2022-12-21 DIAGNOSIS — G4730 Sleep apnea, unspecified: Secondary | ICD-10-CM

## 2022-12-21 DIAGNOSIS — E785 Hyperlipidemia, unspecified: Secondary | ICD-10-CM

## 2022-12-21 DIAGNOSIS — E1159 Type 2 diabetes mellitus with other circulatory complications: Secondary | ICD-10-CM

## 2022-12-21 DIAGNOSIS — I152 Hypertension secondary to endocrine disorders: Secondary | ICD-10-CM

## 2022-12-21 DIAGNOSIS — M810 Age-related osteoporosis without current pathological fracture: Secondary | ICD-10-CM

## 2022-12-21 MED ORDER — SOLIFENACIN SUCCINATE 10 MG PO TABS: 10.0000 mg | ORAL_TABLET | Freq: Every day | ORAL | 1 refills | Status: DC

## 2022-12-21 MED ORDER — METOPROLOL TARTRATE 50 MG PO TABS: 50.0000 mg | ORAL_TABLET | Freq: Two times a day (BID) | ORAL | 0 refills | Status: DC

## 2022-12-21 MED ORDER — SEMAGLUTIDE(0.25 OR 0.5MG/DOS) 2 MG/3ML ~~LOC~~ SOPN
0.2500 mg | PEN_INJECTOR | SUBCUTANEOUS | 1 refills | Status: DC
Start: 2022-12-21 — End: 2022-12-23

## 2022-12-21 MED ORDER — ARNUITY ELLIPTA 100 MCG/ACT IN AEPB: 2.0000 | INHALATION_SPRAY | Freq: Every day | RESPIRATORY_TRACT | 5 refills | Status: DC

## 2022-12-21 MED ORDER — LOSARTAN POTASSIUM-HCTZ 100-12.5 MG PO TABS: 1.0000 | ORAL_TABLET | Freq: Every day | ORAL | 3 refills | Status: DC

## 2022-12-21 MED ORDER — ROSUVASTATIN CALCIUM 20 MG PO TABS: 20.0000 mg | ORAL_TABLET | Freq: Every day | ORAL | 0 refills | Status: DC

## 2022-12-21 NOTE — Progress Notes (Signed)
   Subjective:    Patient ID: Sandra Lawrence, female    DOB: 1945/12/28, 77 y.o.   MRN: [id number]  HPI She is here for medication check.  Her last hemoglobin A1c was 9.0.  Prior to that she was in the prediabetic phase.  She was started on Ozempic and has noted a decrease in her blood sugar readings.  There is also a history of osteoporosis but she does not remember getting any follow-up concerning this and I cannot find a record of that.  She continues on Vesicare for her OAB and is happy with the results of that.  She does have a history of cataract and does need follow-up with ophthalmology.  Also blood work still show evidence of stage III CKD.  She does not complain of any depression symptoms.  She continues on her asthma medications.  She does need a refill.   Review of Systems  All other systems reviewed and are negative.      Objective:    Physical Exam Alert and in no distress. Tympanic membranes and canals are normal. Pharyngeal area is normal. Neck is supple without adenopathy or thyromegaly. Cardiac exam shows a regular sinus rhythm without murmurs or gallops. Lungs are clear to auscultation.        Assessment & Plan:   Problem List Items Addressed This Visit     Hypertensive heart disease without CHF (Chronic)   Relevant Medications   losartan-hydrochlorothiazide (HYZAAR) 100-12.5 MG tablet   metoprolol tartrate (LOPRESSOR) 50 MG tablet   rosuvastatin (CRESTOR) 20 MG tablet   Asthma   Relevant Medications   Fluticasone Furoate (ARNUITY ELLIPTA) 100 MCG/ACT AEPB   CAD (coronary artery disease)   Relevant Medications   losartan-hydrochlorothiazide (HYZAAR) 100-12.5 MG tablet   metoprolol tartrate (LOPRESSOR) 50 MG tablet   rosuvastatin (CRESTOR) 20 MG tablet   Cataract associated with type 2 diabetes mellitus (HCC)   Relevant Medications   losartan-hydrochlorothiazide (HYZAAR) 100-12.5 MG tablet   rosuvastatin (CRESTOR) 20 MG tablet   Semaglutide,0.25 or  0.5MG /DOS, 2 MG/3ML SOPN   CHF (congestive heart failure) (HCC)   Relevant Medications   losartan-hydrochlorothiazide (HYZAAR) 100-12.5 MG tablet   metoprolol tartrate (LOPRESSOR) 50 MG tablet   rosuvastatin (CRESTOR) 20 MG tablet   Chronic kidney disease   Depression   Diabetes mellitus (HCC) - Primary   Relevant Medications   losartan-hydrochlorothiazide (HYZAAR) 100-12.5 MG tablet   rosuvastatin (CRESTOR) 20 MG tablet   Semaglutide,0.25 or 0.5MG /DOS, 2 MG/3ML SOPN   Hyperlipidemia associated with type 2 diabetes mellitus (HCC)   Relevant Medications   losartan-hydrochlorothiazide (HYZAAR) 100-12.5 MG tablet   metoprolol tartrate (LOPRESSOR) 50 MG tablet   rosuvastatin (CRESTOR) 20 MG tablet   Semaglutide,0.25 or 0.5MG /DOS, 2 MG/3ML SOPN   Hypertension associated with type 2 diabetes mellitus (HCC)   Relevant Medications   losartan-hydrochlorothiazide (HYZAAR) 100-12.5 MG tablet   metoprolol tartrate (LOPRESSOR) 50 MG tablet   rosuvastatin (CRESTOR) 20 MG tablet   Semaglutide,0.25 or 0.5MG /DOS, 2 MG/3ML SOPN   OAB (overactive bladder)   Relevant Medications   solifenacin (VESICARE) 10 MG tablet   Obesity   Relevant Medications   Semaglutide,0.25 or 0.5MG /DOS, 2 MG/3ML SOPN   Osteoporosis   Relevant Orders   DG Bone Density

## 2022-12-23 ENCOUNTER — Telehealth: Payer: Self-pay

## 2022-12-23 DIAGNOSIS — I119 Hypertensive heart disease without heart failure: Secondary | ICD-10-CM

## 2022-12-23 DIAGNOSIS — I152 Hypertension secondary to endocrine disorders: Secondary | ICD-10-CM

## 2022-12-23 DIAGNOSIS — E1169 Type 2 diabetes mellitus with other specified complication: Secondary | ICD-10-CM

## 2022-12-23 DIAGNOSIS — N3281 Overactive bladder: Secondary | ICD-10-CM

## 2022-12-23 MED ORDER — ROSUVASTATIN CALCIUM 20 MG PO TABS
20.0000 mg | ORAL_TABLET | Freq: Every day | ORAL | 0 refills | Status: DC
Start: 2022-12-23 — End: 2023-04-04

## 2022-12-23 MED ORDER — METOPROLOL TARTRATE 50 MG PO TABS
50.0000 mg | ORAL_TABLET | Freq: Two times a day (BID) | ORAL | 0 refills | Status: DC
Start: 2022-12-23 — End: 2023-04-12

## 2022-12-23 MED ORDER — SEMAGLUTIDE(0.25 OR 0.5MG/DOS) 2 MG/3ML ~~LOC~~ SOPN
0.2500 mg | PEN_INJECTOR | SUBCUTANEOUS | 1 refills | Status: DC
Start: 2022-12-23 — End: 2023-04-05

## 2022-12-23 MED ORDER — LOSARTAN POTASSIUM-HCTZ 100-12.5 MG PO TABS
1.0000 | ORAL_TABLET | Freq: Every day | ORAL | 3 refills | Status: DC
Start: 2022-12-23 — End: 2023-07-19

## 2022-12-23 MED ORDER — ARNUITY ELLIPTA 100 MCG/ACT IN AEPB: 2.0000 | INHALATION_SPRAY | Freq: Every day | RESPIRATORY_TRACT | 5 refills | Status: DC

## 2022-12-23 MED ORDER — SOLIFENACIN SUCCINATE 10 MG PO TABS
10.0000 mg | ORAL_TABLET | Freq: Every day | ORAL | 1 refills | Status: DC
Start: 2022-12-23 — End: 2023-05-31

## 2022-12-23 NOTE — Telephone Encounter (Signed)
Pt stated she received a voicemail. Called back to confirm that her pharmacy is the [facility] on [address] and [address]. Rx were sent through mail order but requested that rx be resent to [facility].

## 2022-12-23 NOTE — Telephone Encounter (Signed)
All meds that were rx'd Tuesday 12/21/22 were re-sent to [facility].

## 2023-01-01 ENCOUNTER — Other Ambulatory Visit: Payer: Self-pay | Admitting: Family Medicine

## 2023-01-06 ENCOUNTER — Ambulatory Visit (AMBULATORY_SURGERY_CENTER): Payer: Medicare PPO

## 2023-01-06 ENCOUNTER — Encounter: Payer: Self-pay | Admitting: Internal Medicine

## 2023-01-06 VITALS — Ht 67.0 in | Wt 289.0 lb

## 2023-01-06 DIAGNOSIS — Z1211 Encounter for screening for malignant neoplasm of colon: Secondary | ICD-10-CM

## 2023-01-06 DIAGNOSIS — Z86010 Personal history of colonic polyps: Secondary | ICD-10-CM

## 2023-01-06 MED ORDER — PEG 3350-KCL-NA BICARB-NACL 420 G PO SOLR
4000.0000 mL | Freq: Once | ORAL | 0 refills | Status: AC
Start: 2023-01-06 — End: 2023-01-06

## 2023-01-06 NOTE — Progress Notes (Signed)
No egg or soy allergy known to patient  No issues known to pt with past sedation with any surgeries or procedures Patient denies ever being told they had issues or difficulty with intubation  No FH of Malignant Hyperthermia Pt is not on diet pills Pt is not on  home 02  Pt is not on blood thinners  Pt reports constipation for years with increase due to ozempic  No A fib or A flutter Have any cardiac testing pending--no  LOA: independent  Prep: 2 day Golytely    PV competed with patient. Prep instructions sent via mychart and home address.

## 2023-01-20 ENCOUNTER — Telehealth: Payer: Self-pay | Admitting: Internal Medicine

## 2023-01-20 NOTE — Telephone Encounter (Signed)
Inbound call from patient stating she has been taking alka seltzer for acid reflux. Patient requesting a call to discuss alternatives that will not interfere with 9/12 colonoscopy. Please advise, thank you.

## 2023-01-20 NOTE — Telephone Encounter (Signed)
Spoke with patient all questions addressed

## 2023-01-22 ENCOUNTER — Encounter: Payer: Self-pay | Admitting: Certified Registered Nurse Anesthetist

## 2023-01-27 ENCOUNTER — Ambulatory Visit (AMBULATORY_SURGERY_CENTER): Payer: Medicare PPO | Admitting: Internal Medicine

## 2023-01-27 ENCOUNTER — Encounter: Payer: Self-pay | Admitting: Internal Medicine

## 2023-01-27 VITALS — BP 151/81 | HR 70 | Temp 98.7°F | Resp 18 | Ht 67.0 in | Wt 289.0 lb

## 2023-01-27 DIAGNOSIS — Z86010 Personal history of colonic polyps: Secondary | ICD-10-CM

## 2023-01-27 DIAGNOSIS — Z09 Encounter for follow-up examination after completed treatment for conditions other than malignant neoplasm: Secondary | ICD-10-CM | POA: Diagnosis not present

## 2023-01-27 DIAGNOSIS — D124 Benign neoplasm of descending colon: Secondary | ICD-10-CM | POA: Diagnosis not present

## 2023-01-27 DIAGNOSIS — D123 Benign neoplasm of transverse colon: Secondary | ICD-10-CM

## 2023-01-27 DIAGNOSIS — D122 Benign neoplasm of ascending colon: Secondary | ICD-10-CM | POA: Diagnosis not present

## 2023-01-27 DIAGNOSIS — R112 Nausea with vomiting, unspecified: Secondary | ICD-10-CM | POA: Diagnosis not present

## 2023-01-27 MED ORDER — SODIUM CHLORIDE 0.9 % IV SOLN
4.0000 mg | Freq: Once | INTRAVENOUS | Status: AC
Start: 2023-01-27 — End: 2023-01-27
  Administered 2023-01-27: 4 mg via INTRAVENOUS

## 2023-01-27 MED ORDER — SODIUM CHLORIDE 0.9 % IV SOLN
500.0000 mL | Freq: Once | INTRAVENOUS | Status: DC
Start: 2023-01-27 — End: 2023-01-27

## 2023-01-27 NOTE — Patient Instructions (Signed)
Await pathology results.  Resume previous diet. Continue present medications.  Handout on polyps, diverticulosis, and hemYOU HAD AN ENDOSCOPIC PROCEDURE TODAY AT THE [facility]:   Refer to the procedure report that was given to you for any specific questions about what was found during the examination.  If the procedure report does not answer your questions, please call your gastroenterologist to clarify.  If you requested that your care partner not be given the details of your procedure findings, then the procedure report has been included in a sealed envelope for you to review at your convenience later.  YOU SHOULD EXPECT: Some feelings of bloating in the abdomen. Passage of more gas than usual.  Walking can help get rid of the air that was put into your GI tract during the procedure and reduce the bloating. If you had a lower endoscopy (such as a colonoscopy or flexible sigmoidoscopy) you may notice spotting of blood in your stool or on the toilet paper. If you underwent a bowel prep for your procedure, you may not have a normal bowel movement for a few days.  Please Note:  You might notice some irritation and congestion in your nose or some drainage.  This is from the oxygen used during your procedure.  There is no need for concern and it should clear up in a day or so.  SYMPTOMS TO REPORT IMMEDIATELY:  Following lower endoscopy (colonoscopy or flexible sigmoidoscopy):  Excessive amounts of blood in the stool  Significant tenderness or worsening of abdominal pains  Swelling of the abdomen that is new, acute  Fever of 100F or higher   For urgent or emergent issues, a gastroenterologist can be reached at any hour by calling [phone]. Do not use MyChart messaging for urgent concerns.    DIET:  We do recommend a small meal at first, but then you may proceed to your regular diet.  Drink plenty of fluids but you should avoid alcoholic beverages for 24 hours.  ACTIVITY:   You should plan to take it easy for the rest of today and you should NOT DRIVE or use heavy machinery until tomorrow (because of the sedation medicines used during the test).    FOLLOW UP: Our staff will call the number listed on your records the next business day following your procedure.  We will call around 7:15- 8:00 am to check on you and address any questions or concerns that you may have regarding the information given to you following your procedure. If we do not reach you, we will leave a message.     If any biopsies were taken you will be contacted by phone or by letter within the next 1-3 weeks.  Please call us at [phone] if you have not heard about the biopsies in 3 weeks.    SIGNATURES/CONFIDENTIALITY: You and/or your care partner have signed paperwork which will be entered into your electronic medical record.  These signatures attest to the fact that that the information above on your After Visit Summary has been reviewed and is understood.  Full responsibility of the confidentiality of this discharge information lies with you and/or your care-partner.

## 2023-01-27 NOTE — Progress Notes (Signed)
Called to room to assist during endoscopic procedure.  Patient ID and intended procedure confirmed with present staff. Received instructions for my participation in the procedure from the performing physician.  

## 2023-01-27 NOTE — Progress Notes (Signed)
Report given to PACU, vss 

## 2023-01-27 NOTE — Progress Notes (Signed)
1105 BP 174/101 , Labetalol given IV, MD update, vss

## 2023-01-27 NOTE — Op Note (Signed)
[facility] Patient Name: Sandra Lawrence Procedure Date: 01/27/2023 10:45 AM MRN: [id number] Endoscopist: Beverley Fiedler , MD, [id number] Age: 77 Referring MD:  Date of Birth: Feb 13, 1946 Gender: Female Account #: [id number] Procedure:                Colonoscopy Indications:              High risk colon cancer surveillance: Personal                            history of non-advanced adenomas, Last colonoscopy:                            April 2014 (2 adenomas) Medicines:                Monitored Anesthesia Care Procedure:                Pre-Anesthesia Assessment:                           - Prior to the procedure, a History and Physical                            was performed, and patient medications and                            allergies were reviewed. The patient's tolerance of                            previous anesthesia was also reviewed. The risks                            and benefits of the procedure and the sedation                            options and risks were discussed with the patient.                            All questions were answered, and informed consent                            was obtained. Prior Anticoagulants: The patient has                            taken no anticoagulant or antiplatelet agents. ASA                            Grade Assessment: III - A patient with severe                            systemic disease. After reviewing the risks and                            benefits, the patient was deemed in satisfactory  condition to undergo the procedure.                           After obtaining informed consent, the colonoscope                            was passed under direct vision. Throughout the                            procedure, the patient's blood pressure, pulse, and                            oxygen saturations were monitored continuously. The                            CF [id number] #[id number] was introduced  through the anus                            and advanced to the cecum, identified by                            appendiceal orifice and ileocecal valve. The                            colonoscopy was somewhat difficult due to a                            redundant colon. Successful completion of the                            procedure was aided by applying abdominal pressure.                            The patient tolerated the procedure well. The                            quality of the bowel preparation was good. The                            ileocecal valve, appendiceal orifice, and rectum                            were photographed. Scope In: 10:56:54 AM Scope Out: 11:28:44 AM Scope Withdrawal Time: 0 hours 18 minutes 32 seconds  Total Procedure Duration: 0 hours 31 minutes 50 seconds  Findings:                 The digital rectal exam was normal.                           Two sessile polyps were found in the ascending                            colon. The polyps were 4 to 6 mm in size. These  polyps were removed with a cold snare. Resection                            and retrieval were complete.                           Five sessile polyps were found in the transverse                            colon. The polyps were 4 to 9 mm in size. These                            polyps were removed with a cold snare. Resection                            and retrieval were complete.                           Two sessile polyps were found in the descending                            colon. The polyps were 4 to 5 mm in size. These                            polyps were removed with a cold snare. Resection                            and retrieval were complete.                           Multiple medium-mouthed and small-mouthed                            diverticula were found in the sigmoid colon and                            descending colon.                            Internal hemorrhoids were found during                            retroflexion. The hemorrhoids were small. Complications:            No immediate complications. Estimated Blood Loss:     Estimated blood loss was minimal. Impression:               - Two 4 to 6 mm polyps in the ascending colon,                            removed with a cold snare. Resected and retrieved.                           - Five 4 to 9 mm polyps in the transverse colon,  removed with a cold snare. Resected and retrieved.                           - Two 4 to 5 mm polyps in the descending colon,                            removed with a cold snare. Resected and retrieved.                           - Mild diverticulosis in the sigmoid colon and in                            the descending colon.                           - Small internal hemorrhoids. Recommendation:           - Patient has a contact number available for                            emergencies. The signs and symptoms of potential                            delayed complications were discussed with the                            patient. Return to normal activities tomorrow.                            Written discharge instructions were provided to the                            patient.                           - Resume previous diet.                           - Continue present medications.                           - Await pathology results.                           - Repeat colonoscopy is recommended for                            surveillance. The colonoscopy date will be                            determined after pathology results from today's                            exam become available for review. Beverley Fiedler, MD 01/27/2023 11:31:58 AM This report has been signed electronically.

## 2023-01-27 NOTE — Progress Notes (Signed)
1055 BP 189/128, Labetalol given IV, MD update, vss

## 2023-01-27 NOTE — Progress Notes (Signed)
Pt's states no medical or surgical changes since previsit or office visit. 
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
## 2023-01-28 ENCOUNTER — Telehealth: Payer: Self-pay | Admitting: *Deleted

## 2023-01-28 NOTE — Telephone Encounter (Signed)
  Follow up Call-     01/27/2023   10:21 AM  Call back number  Post procedure Call Back phone  # [phone]  Permission to leave phone message Yes     Patient questions:  Do you have a fever, pain , or abdominal swelling? No. Pain Score  0 *  Have you tolerated food without any problems? Yes.    Have you been able to return to your normal activities? Yes.    Do you have any questions about your discharge instructions: Diet   No. Medications  No. Follow up visit  No.  Do you have questions or concerns about your Care? No.  Actions: * If pain score is 4 or above: No action needed, pain <4.

## 2023-02-01 ENCOUNTER — Encounter: Payer: Self-pay | Admitting: Internal Medicine

## 2023-02-01 LAB — SURGICAL PATHOLOGY

## 2023-03-03 ENCOUNTER — Ambulatory Visit: Payer: Medicare PPO | Admitting: Family Medicine

## 2023-03-22 ENCOUNTER — Encounter: Payer: Medicare PPO | Admitting: Family Medicine

## 2023-04-02 ENCOUNTER — Other Ambulatory Visit: Payer: Self-pay | Admitting: Family Medicine

## 2023-04-02 DIAGNOSIS — E1169 Type 2 diabetes mellitus with other specified complication: Secondary | ICD-10-CM

## 2023-04-12 ENCOUNTER — Other Ambulatory Visit: Payer: Self-pay | Admitting: Family Medicine

## 2023-04-12 DIAGNOSIS — I152 Hypertension secondary to endocrine disorders: Secondary | ICD-10-CM

## 2023-04-20 ENCOUNTER — Encounter: Payer: Self-pay | Admitting: Family Medicine

## 2023-04-20 ENCOUNTER — Ambulatory Visit (INDEPENDENT_AMBULATORY_CARE_PROVIDER_SITE_OTHER): Payer: Medicare PPO | Admitting: Family Medicine

## 2023-04-20 VITALS — BP 142/94 | HR 80 | Ht 67.0 in | Wt 291.0 lb

## 2023-04-20 DIAGNOSIS — N183 Chronic kidney disease, stage 3 unspecified: Secondary | ICD-10-CM

## 2023-04-20 DIAGNOSIS — E782 Mixed hyperlipidemia: Secondary | ICD-10-CM

## 2023-04-20 DIAGNOSIS — I152 Hypertension secondary to endocrine disorders: Secondary | ICD-10-CM

## 2023-04-20 DIAGNOSIS — Z6841 Body Mass Index (BMI) 40.0 and over, adult: Secondary | ICD-10-CM

## 2023-04-20 DIAGNOSIS — E1159 Type 2 diabetes mellitus with other circulatory complications: Secondary | ICD-10-CM

## 2023-04-20 DIAGNOSIS — I119 Hypertensive heart disease without heart failure: Secondary | ICD-10-CM

## 2023-04-20 DIAGNOSIS — J4530 Mild persistent asthma, uncomplicated: Secondary | ICD-10-CM

## 2023-04-20 DIAGNOSIS — E1169 Type 2 diabetes mellitus with other specified complication: Secondary | ICD-10-CM | POA: Diagnosis not present

## 2023-04-20 DIAGNOSIS — J069 Acute upper respiratory infection, unspecified: Secondary | ICD-10-CM

## 2023-04-20 LAB — HEMOGLOBIN A1C
Est. average glucose Bld gHb Est-mCnc: 160 mg/dL
Hgb A1c MFr Bld: 7.2 % — ABNORMAL HIGH (ref 4.8–5.6)

## 2023-04-20 MED ORDER — BENZONATATE 200 MG PO CAPS: 200.0000 mg | ORAL_CAPSULE | Freq: Two times a day (BID) | ORAL | 0 refills | Status: DC | PRN

## 2023-04-20 MED ORDER — ARNUITY ELLIPTA 100 MCG/ACT IN AEPB: 2.0000 | INHALATION_SPRAY | Freq: Every day | RESPIRATORY_TRACT | 5 refills | Status: DC

## 2023-04-20 NOTE — Progress Notes (Signed)
Subjective:    Patient ID: Sandra Lawrence, female    DOB: 1945/08/27, 77 y.o.   MRN: [id number]  Sandra Lawrence is a 77 y.o. female who presents for follow-up of Type 2 diabetes mellitus.  Patient is not checking home blood sugars.   Home blood sugar records: patient does not check sugars How often is blood sugars being checked: does not check because her fingers are sore  Current symptoms/problems include polyuria and visual disturbances and have been Unchanged. Daily foot checks: yes   Any foot concerns: none Last eye exam: November 2024 Exercise: Exercise is limited by orthopedic condition(s): back pain. She has not been able to get her Arnuity refilled and she is using her inhaler once or twice per day especially since she has had some recent trouble with sore throat and coughing but no fever, chills, earache.  She continues on Ozempic.  She has had some difficulty with reflux symptoms and has been using Tums.  Continues on Crestor as well as metoprolol and losartan/HCTZ. The following portions of the patient's history were reviewed and updated as appropriate: allergies, current medications, past medical history, past social history and problem list.  ROS as in subjective above.     Objective:    Physical Exam Alert and in no distress. Tympanic membranes and canals are normal. Pharyngeal area is normal. Neck is supple without adenopathy or thyromegaly. Cardiac exam shows a regular sinus rhythm without murmurs or gallops. Lungs are clear to auscultation.    Lab Review    Latest Ref Rng & Units 11/17/2022   12:32 PM 11/17/2022   11:10 AM 02/27/2022   11:18 AM 10/08/2021   11:48 AM 07/17/2021   12:59 PM  Diabetic Labs  HbA1c 4.0 - 5.6 %  9.0   5.9  6.1   Micro/Creat Ratio 0 - 29 mg/g creat 18       Chol 100 - 199 mg/dL 469    629    HDL >52 mg/dL 53    48    Calc LDL 0 - 99 mg/dL 82    72    Triglycerides 0 - 149 mg/dL 95    71    Creatinine 0.57 - 1.00 mg/dL 8.41   3.24  4.01   0.27       04/20/2023    9:44 AM 01/27/2023   11:51 AM 01/27/2023   11:41 AM 01/27/2023   11:31 AM 01/27/2023   11:25 AM  BP/Weight  Systolic BP 142 151 167 131 116  Diastolic BP 94 81 84 65 69  Wt. (Lbs) 291      BMI 45.58 kg/m2          Latest Ref Rng & Units 10/30/2020   12:00 AM 11/04/2016    2:30 PM  Foot/eye exam completion dates  Eye Exam No Retinopathy No Retinopathy       Foot Form Completion   Done     This result is from an external source.    Sandra Lawrence  reports that she quit smoking about 43 years ago. Her smoking use included cigarettes. She has never used smokeless tobacco. She reports that she does not currently use alcohol. She reports that she does not use drugs.     Assessment & Plan:    Type 2 diabetes mellitus with other specified complication, without long-term current use of insulin (HCC) - Plan: Hemoglobin A1c  Morbid obesity with BMI of 40.0-44.9, adult (HCC)  Mixed diabetic hyperlipidemia associated with type  2 diabetes mellitus (HCC)  Hypertension associated with type 2 diabetes mellitus (HCC)  Hypertensive heart disease without CHF  Stage 3 chronic kidney disease, unspecified whether stage 3a or 3b CKD (HCC)  URI with cough and congestion - Plan: benzonatate (TESSALON) 200 MG capsule  Mild persistent asthma, unspecified whether complicated - Plan: Fluticasone Furoate (ARNUITY ELLIPTA) 100 MCG/ACT AEPB Try Nexium for your reflux symptoms.  Call Friday if not any better

## 2023-04-20 NOTE — Patient Instructions (Addendum)
Try Nexium for your reflux symptoms.  Call Friday if not any better

## 2023-04-22 ENCOUNTER — Telehealth: Payer: Self-pay | Admitting: Family Medicine

## 2023-04-22 MED ORDER — AZITHROMYCIN 500 MG PO TABS: 500.0000 mg | ORAL_TABLET | Freq: Every day | ORAL | 0 refills | Status: DC

## 2023-04-22 NOTE — Telephone Encounter (Signed)
Pt seen you Wednesday and had a cough, she said to let you know her cough has not gotten any better. I offered her to see Sandra Lawrence today but she only wanted me to update you.

## 2023-04-25 ENCOUNTER — Ambulatory Visit (HOSPITAL_COMMUNITY)
Admission: EM | Admit: 2023-04-25 | Discharge: 2023-04-25 | Disposition: A | Discharge status: Home / Self Care | Payer: Medicare PPO | Attending: Emergency Medicine | Admitting: Emergency Medicine

## 2023-04-25 ENCOUNTER — Telehealth: Payer: Self-pay

## 2023-04-25 ENCOUNTER — Encounter (HOSPITAL_COMMUNITY): Payer: Self-pay

## 2023-04-25 ENCOUNTER — Ambulatory Visit (INDEPENDENT_AMBULATORY_CARE_PROVIDER_SITE_OTHER): Payer: Medicare PPO

## 2023-04-25 DIAGNOSIS — J069 Acute upper respiratory infection, unspecified: Secondary | ICD-10-CM

## 2023-04-25 LAB — POC COVID19/FLU A&B COMBO
Covid Antigen, POC: NEGATIVE
Influenza A Antigen, POC: NEGATIVE
Influenza B Antigen, POC: NEGATIVE

## 2023-04-25 MED ORDER — PREDNISONE 10 MG (21) PO TBPK: ORAL_TABLET | Freq: Every day | ORAL | 0 refills | Status: DC

## 2023-04-25 NOTE — Telephone Encounter (Signed)
Clarification requested for arnuity ellipta inhaler:  Rx directions are 2 puffs daily but is usually dosed at 1 puff daily.

## 2023-04-25 NOTE — Telephone Encounter (Signed)
Notified [facility] pharmacist of clarification.
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
## 2023-04-25 NOTE — Discharge Instructions (Addendum)
Patient's chest x-ray is negative Discussed with patient for her to follow-up with pharmacy about feeling the prescription for her inhaler that was previously prescribed Use albuterol inhaler as needed If symptoms become worse she will need to be seen in the emergency room COVID and flu test negative

## 2023-04-25 NOTE — ED Triage Notes (Signed)
Pt is here for a follow-up on her cough and congestion. Patient was seen last week and prescribed Azithromycin and QVAR, however patient has not picked up the QVAR.

## 2023-05-06 ENCOUNTER — Telehealth: Payer: Self-pay | Admitting: Family Medicine

## 2023-05-06 DIAGNOSIS — E1169 Type 2 diabetes mellitus with other specified complication: Secondary | ICD-10-CM

## 2023-05-06 MED ORDER — OZEMPIC (0.25 OR 0.5 MG/DOSE) 2 MG/3ML ~~LOC~~ SOPN: 0.5000 mg | PEN_INJECTOR | SUBCUTANEOUS | 1 refills | Status: DC

## 2023-05-06 NOTE — Telephone Encounter (Signed)
Pt called and is needing a refill on ozempic, she says she ran out this past Tuesday. She uses  [id number] - [place], [place] - [address]

## 2023-05-24 DIAGNOSIS — H43813 Vitreous degeneration, bilateral: Secondary | ICD-10-CM | POA: Diagnosis not present

## 2023-05-24 DIAGNOSIS — H2513 Age-related nuclear cataract, bilateral: Secondary | ICD-10-CM | POA: Diagnosis not present

## 2023-05-24 DIAGNOSIS — H25013 Cortical age-related cataract, bilateral: Secondary | ICD-10-CM | POA: Diagnosis not present

## 2023-05-24 DIAGNOSIS — H524 Presbyopia: Secondary | ICD-10-CM | POA: Diagnosis not present

## 2023-05-24 DIAGNOSIS — E119 Type 2 diabetes mellitus without complications: Secondary | ICD-10-CM | POA: Diagnosis not present

## 2023-05-24 LAB — HM DIABETES EYE EXAM

## 2023-05-31 ENCOUNTER — Other Ambulatory Visit: Payer: Self-pay | Admitting: Family Medicine

## 2023-05-31 DIAGNOSIS — N3281 Overactive bladder: Secondary | ICD-10-CM

## 2023-06-14 ENCOUNTER — Ambulatory Visit: Payer: Medicare PPO

## 2023-06-14 DIAGNOSIS — Z0000 Encounter for general adult medical examination without abnormal findings: Secondary | ICD-10-CM

## 2023-06-14 NOTE — Patient Instructions (Signed)
Sandra Lawrence , Thank you for taking time to come for your Medicare Wellness Visit. I appreciate your ongoing commitment to your health goals. Please review the following plan we discussed and let me know if I can assist you in the future.   Referrals/Orders/Follow-Ups/Clinician Recommendations: none  This is a list of the screening recommended for you and due dates:  Health Maintenance  Topic Date Due   DEXA scan (bone density measurement)  05/15/2013   Complete foot exam   11/04/2017   Zoster (Shingles) Vaccine (2 of 2) 08/22/2020   COVID-19 Vaccine (4 - 2024-25 season) 01/16/2023   Flu Shot  08/15/2023*   Hemoglobin A1C  10/19/2023   Yearly kidney function blood test for diabetes  11/17/2023   Yearly kidney health urinalysis for diabetes  11/17/2023   Eye exam for diabetics  05/23/2024   Medicare Annual Wellness Visit  06/13/2024   Colon Cancer Screening  01/26/2026   DTaP/Tdap/Td vaccine (3 - Td or Tdap) 11/22/2032   Pneumonia Vaccine  Completed   Hepatitis C Screening  Completed   HPV Vaccine  Aged Out  *Topic was postponed. The date shown is not the original due date.    Advanced directives: (ACP Link)Information on Advanced Care Planning can be found at [facility] Advance Health Care Directives Advance Health Care Directives ([email or web address]   Next Medicare Annual Wellness Visit scheduled for next year: Yes  insert Preventive Care attachment Insert FALL PREVENTION attachment if needed
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
## 2023-06-15 ENCOUNTER — Telehealth: Payer: Self-pay | Admitting: *Deleted

## 2023-06-15 NOTE — Progress Notes (Signed)
Complex Care Management Note Care Guide Note  06/15/2023 Name: Sandra Lawrence MRN: [id number] DOB: Dec 30, 1945   Complex Care Management Outreach Attempts: An unsuccessful telephone outreach was attempted today to offer the patient information about available complex care management services.  Follow Up Plan:  Additional outreach attempts will be made to offer the patient complex care management information and services.   Encounter Outcome:  No Answer  Gwenevere Ghazi  [facility]  [facility], [facility] Guide  Direct Dial: [phone]  Fax [phone]

## 2023-06-17 NOTE — Progress Notes (Unsigned)
Complex Care Management Note Care Guide Note  06/17/2023 Name: [name] MRN: [id number] DOB: February 21, 1946   Complex Care Management Outreach Attempts: A second unsuccessful outreach was attempted today to offer the patient with information about available complex care management services.  Follow Up Plan:  Additional outreach attempts will be made to offer the patient complex care management information and services.   Encounter Outcome:  No Answer  Gwenevere Ghazi  [facility]  [facility], [facility] Guide  Direct Dial: [phone]  Fax [phone]

## 2023-06-21 NOTE — Progress Notes (Signed)
 Complex Care Management Note Care Guide Note  06/21/2023 Name: Sandra Lawrence MRN: [id number] DOB: [birth year]   Complex Care Management Outreach Attempts: A third unsuccessful outreach was attempted today to offer the patient with information about available complex care management services.  Follow Up Plan:  No further outreach attempts will be made at this time. We have been unable to contact the patient to offer or enroll patient in complex care management services.  Encounter Outcome:  No Answer  Harlene Satterfield  [facility]  [facility], [facility] Guide  Direct Dial: [phone]  Fax [phone]

## 2023-06-30 ENCOUNTER — Encounter: Payer: Self-pay | Admitting: Internal Medicine

## 2023-06-30 ENCOUNTER — Ambulatory Visit (INDEPENDENT_AMBULATORY_CARE_PROVIDER_SITE_OTHER): Payer: 59 | Admitting: Family Medicine

## 2023-06-30 ENCOUNTER — Encounter: Payer: Self-pay | Admitting: Family Medicine

## 2023-06-30 VITALS — BP 132/86 | HR 78 | Ht 67.0 in | Wt 299.4 lb

## 2023-06-30 DIAGNOSIS — Z2821 Immunization not carried out because of patient refusal: Secondary | ICD-10-CM

## 2023-06-30 DIAGNOSIS — M25511 Pain in right shoulder: Secondary | ICD-10-CM | POA: Diagnosis not present

## 2023-06-30 DIAGNOSIS — M25512 Pain in left shoulder: Secondary | ICD-10-CM | POA: Diagnosis not present

## 2023-06-30 DIAGNOSIS — Z96641 Presence of right artificial hip joint: Secondary | ICD-10-CM

## 2023-06-30 DIAGNOSIS — G8929 Other chronic pain: Secondary | ICD-10-CM | POA: Diagnosis not present

## 2023-06-30 DIAGNOSIS — M545 Low back pain, unspecified: Secondary | ICD-10-CM | POA: Diagnosis not present

## 2023-06-30 NOTE — Progress Notes (Signed)
   Subjective:    Patient ID: Sandra Lawrence, female    DOB: 1946-04-26, 78 y.o.   MRN: [id number]  HPI She is here for consult concerning difficulty with shoulder arthritis interfering with her ability to do ADLs, specifically able to wash her back.  She has apparently seen [facility] for this and she states that they have given her injections in her shoulder.  She also mention the fact that she has had difficulty with low back pain and has been taking NSAIDs to help with that.  She mentions seeing Dr. Shon Baton in the past for this and apparently recommended an intervention it would cost her money and she did not pursue this.  Review of the records also indicates that she has seen Dr. Constance Goltz in the past as well as Dr. Ethelene Hal   Review of Systems     Objective:    Physical Exam Alert and in no distress otherwise not examined       Assessment & Plan:  Chronic right-sided low back pain without sciatica - Plan: Ambulatory referral to Orthopedic Surgery  Chronic pain of both shoulders - Plan: Ambulatory referral to Orthopedic Surgery  History of total hip arthroplasty, right - Plan: Ambulatory referral to Orthopedic Surgery  Immunization refused I explained to I think that she is getting lost in the shuffle of seeing various orthopedic groups several of these visits were several years ago.  At this point her main issues seem to be shoulders and low back.  These are interfering with her ADLs.  I explained that the orthopedic group will need to look at both of these and give her more definitive care which might possibly negate the need to get home health involved to help her with her ADLs. I also recommended getting her immunizations updated but she refused.

## 2023-06-30 NOTE — Addendum Note (Signed)
Addended by: Ronnald Nian on: 06/30/2023 11:36 AM   Modules accepted: Level of Service

## 2023-06-30 NOTE — Addendum Note (Signed)
Addended by: Ronnald Nian on: 06/30/2023 03:36 PM   Modules accepted: Level of Service

## 2023-07-04 DIAGNOSIS — M79674 Pain in right toe(s): Secondary | ICD-10-CM | POA: Diagnosis not present

## 2023-07-12 ENCOUNTER — Encounter: Payer: Self-pay | Admitting: Internal Medicine

## 2023-07-14 DIAGNOSIS — M25512 Pain in left shoulder: Secondary | ICD-10-CM | POA: Diagnosis not present

## 2023-07-14 DIAGNOSIS — M545 Low back pain, unspecified: Secondary | ICD-10-CM | POA: Diagnosis not present

## 2023-07-14 DIAGNOSIS — M25511 Pain in right shoulder: Secondary | ICD-10-CM | POA: Diagnosis not present

## 2023-07-14 DIAGNOSIS — M25551 Pain in right hip: Secondary | ICD-10-CM | POA: Diagnosis not present

## 2023-07-15 ENCOUNTER — Emergency Department (HOSPITAL_COMMUNITY): Payer: Medicare PPO

## 2023-07-15 ENCOUNTER — Encounter (HOSPITAL_COMMUNITY): Payer: Self-pay | Admitting: Cardiology

## 2023-07-15 ENCOUNTER — Other Ambulatory Visit: Payer: Self-pay

## 2023-07-15 ENCOUNTER — Inpatient Hospital Stay (HOSPITAL_COMMUNITY)
Admission: EM | Admit: 2023-07-15 | Discharge: 2023-07-19 | DRG: 322 | Disposition: A | Discharge status: Home Health | Payer: Medicare PPO | Attending: Cardiology | Admitting: Cardiology

## 2023-07-15 ENCOUNTER — Ambulatory Visit: Payer: Self-pay | Admitting: Family Medicine

## 2023-07-15 DIAGNOSIS — Z7985 Long-term (current) use of injectable non-insulin antidiabetic drugs: Secondary | ICD-10-CM | POA: Diagnosis not present

## 2023-07-15 DIAGNOSIS — Z809 Family history of malignant neoplasm, unspecified: Secondary | ICD-10-CM

## 2023-07-15 DIAGNOSIS — R0789 Other chest pain: Secondary | ICD-10-CM | POA: Diagnosis not present

## 2023-07-15 DIAGNOSIS — Z79899 Other long term (current) drug therapy: Secondary | ICD-10-CM | POA: Diagnosis not present

## 2023-07-15 DIAGNOSIS — Z87891 Personal history of nicotine dependence: Secondary | ICD-10-CM | POA: Diagnosis not present

## 2023-07-15 DIAGNOSIS — E785 Hyperlipidemia, unspecified: Secondary | ICD-10-CM | POA: Diagnosis not present

## 2023-07-15 DIAGNOSIS — Z87442 Personal history of urinary calculi: Secondary | ICD-10-CM

## 2023-07-15 DIAGNOSIS — I493 Ventricular premature depolarization: Secondary | ICD-10-CM | POA: Diagnosis present

## 2023-07-15 DIAGNOSIS — Z7982 Long term (current) use of aspirin: Secondary | ICD-10-CM | POA: Diagnosis not present

## 2023-07-15 DIAGNOSIS — Z6841 Body Mass Index (BMI) 40.0 and over, adult: Secondary | ICD-10-CM | POA: Diagnosis not present

## 2023-07-15 DIAGNOSIS — E7800 Pure hypercholesterolemia, unspecified: Secondary | ICD-10-CM | POA: Diagnosis present

## 2023-07-15 DIAGNOSIS — Z955 Presence of coronary angioplasty implant and graft: Secondary | ICD-10-CM

## 2023-07-15 DIAGNOSIS — J45909 Unspecified asthma, uncomplicated: Secondary | ICD-10-CM | POA: Diagnosis present

## 2023-07-15 DIAGNOSIS — R079 Chest pain, unspecified: Secondary | ICD-10-CM | POA: Diagnosis not present

## 2023-07-15 DIAGNOSIS — R112 Nausea with vomiting, unspecified: Secondary | ICD-10-CM | POA: Diagnosis not present

## 2023-07-15 DIAGNOSIS — I200 Unstable angina: Principal | ICD-10-CM | POA: Diagnosis present

## 2023-07-15 DIAGNOSIS — N3281 Overactive bladder: Secondary | ICD-10-CM | POA: Diagnosis present

## 2023-07-15 DIAGNOSIS — I25110 Atherosclerotic heart disease of native coronary artery with unstable angina pectoris: Principal | ICD-10-CM | POA: Diagnosis present

## 2023-07-15 DIAGNOSIS — M810 Age-related osteoporosis without current pathological fracture: Secondary | ICD-10-CM | POA: Diagnosis present

## 2023-07-15 DIAGNOSIS — E119 Type 2 diabetes mellitus without complications: Secondary | ICD-10-CM | POA: Diagnosis not present

## 2023-07-15 DIAGNOSIS — R739 Hyperglycemia, unspecified: Secondary | ICD-10-CM | POA: Diagnosis not present

## 2023-07-15 DIAGNOSIS — I700 Atherosclerosis of aorta: Secondary | ICD-10-CM | POA: Diagnosis not present

## 2023-07-15 DIAGNOSIS — Z860101 Personal history of adenomatous and serrated colon polyps: Secondary | ICD-10-CM | POA: Diagnosis not present

## 2023-07-15 DIAGNOSIS — G4733 Obstructive sleep apnea (adult) (pediatric): Secondary | ICD-10-CM | POA: Diagnosis present

## 2023-07-15 DIAGNOSIS — K219 Gastro-esophageal reflux disease without esophagitis: Secondary | ICD-10-CM | POA: Diagnosis present

## 2023-07-15 DIAGNOSIS — E1159 Type 2 diabetes mellitus with other circulatory complications: Secondary | ICD-10-CM

## 2023-07-15 DIAGNOSIS — I10 Essential (primary) hypertension: Secondary | ICD-10-CM | POA: Diagnosis present

## 2023-07-15 DIAGNOSIS — Z833 Family history of diabetes mellitus: Secondary | ICD-10-CM

## 2023-07-15 DIAGNOSIS — I509 Heart failure, unspecified: Secondary | ICD-10-CM | POA: Diagnosis present

## 2023-07-15 DIAGNOSIS — Z794 Long term (current) use of insulin: Secondary | ICD-10-CM

## 2023-07-15 DIAGNOSIS — Z7951 Long term (current) use of inhaled steroids: Secondary | ICD-10-CM

## 2023-07-15 DIAGNOSIS — I201 Angina pectoris with documented spasm: Secondary | ICD-10-CM | POA: Diagnosis not present

## 2023-07-15 DIAGNOSIS — I499 Cardiac arrhythmia, unspecified: Secondary | ICD-10-CM | POA: Diagnosis not present

## 2023-07-15 DIAGNOSIS — Z96653 Presence of artificial knee joint, bilateral: Secondary | ICD-10-CM | POA: Diagnosis present

## 2023-07-15 DIAGNOSIS — Z96641 Presence of right artificial hip joint: Secondary | ICD-10-CM | POA: Diagnosis present

## 2023-07-15 DIAGNOSIS — Z5948 Other specified lack of adequate food: Secondary | ICD-10-CM

## 2023-07-15 DIAGNOSIS — Z791 Long term (current) use of non-steroidal anti-inflammatories (NSAID): Secondary | ICD-10-CM | POA: Diagnosis not present

## 2023-07-15 DIAGNOSIS — I110 Hypertensive heart disease with heart failure: Secondary | ICD-10-CM | POA: Diagnosis present

## 2023-07-15 DIAGNOSIS — I2510 Atherosclerotic heart disease of native coronary artery without angina pectoris: Secondary | ICD-10-CM

## 2023-07-15 DIAGNOSIS — R519 Headache, unspecified: Secondary | ICD-10-CM | POA: Diagnosis not present

## 2023-07-15 LAB — CBC WITH DIFFERENTIAL/PLATELET
Abs Immature Granulocytes: 0.04 10*3/uL (ref 0.00–0.07)
Basophils Absolute: 0 10*3/uL (ref 0.0–0.1)
Basophils Relative: 0 %
Eosinophils Absolute: 0 10*3/uL (ref 0.0–0.5)
Eosinophils Relative: 0 %
HCT: 39.5 % (ref 36.0–46.0)
Hemoglobin: 12.4 g/dL (ref 12.0–15.0)
Immature Granulocytes: 0 %
Lymphocytes Relative: 9 %
Lymphs Abs: 0.9 10*3/uL (ref 0.7–4.0)
MCH: 28.2 pg (ref 26.0–34.0)
MCHC: 31.4 g/dL (ref 30.0–36.0)
MCV: 90 fL (ref 80.0–100.0)
Monocytes Absolute: 0.8 10*3/uL (ref 0.1–1.0)
Monocytes Relative: 8 %
Neutro Abs: 8.4 10*3/uL — ABNORMAL HIGH (ref 1.7–7.7)
Neutrophils Relative %: 83 %
Platelets: 235 10*3/uL (ref 150–400)
RBC: 4.39 MIL/uL (ref 3.87–5.11)
RDW: 14.9 % (ref 11.5–15.5)
WBC: 10.1 10*3/uL (ref 4.0–10.5)
nRBC: 0 % (ref 0.0–0.2)

## 2023-07-15 LAB — COMPREHENSIVE METABOLIC PANEL
ALT: 17 U/L (ref 0–44)
AST: 27 U/L (ref 15–41)
Albumin: 3.2 g/dL — ABNORMAL LOW (ref 3.5–5.0)
Alkaline Phosphatase: 75 U/L (ref 38–126)
Anion gap: 17 — ABNORMAL HIGH (ref 5–15)
BUN: 17 mg/dL (ref 8–23)
CO2: 20 mmol/L — ABNORMAL LOW (ref 22–32)
Calcium: 9.7 mg/dL (ref 8.9–10.3)
Chloride: 99 mmol/L (ref 98–111)
Creatinine, Ser: 0.85 mg/dL (ref 0.44–1.00)
GFR, Estimated: >60 mL/min (ref >60)
Glucose, Bld: 368 mg/dL — ABNORMAL HIGH (ref 70–99)
Potassium: 4 mmol/L (ref 3.5–5.1)
Sodium: 136 mmol/L (ref 135–145)
Total Bilirubin: 0.6 mg/dL (ref 0.0–1.2)
Total Protein: 6.6 g/dL (ref 6.5–8.1)

## 2023-07-15 LAB — GLUCOSE, CAPILLARY: Glucose-Capillary: 274 mg/dL — ABNORMAL HIGH (ref 70–99)

## 2023-07-15 LAB — TROPONIN I (HIGH SENSITIVITY)
Troponin I (High Sensitivity): 10 ng/L (ref <18)
Troponin I (High Sensitivity): 15 ng/L (ref <18)

## 2023-07-15 LAB — CBG MONITORING, ED: Glucose-Capillary: 310 mg/dL — ABNORMAL HIGH (ref 70–99)

## 2023-07-15 LAB — BRAIN NATRIURETIC PEPTIDE: B Natriuretic Peptide: 280.1 pg/mL — ABNORMAL HIGH (ref 0.0–100.0)

## 2023-07-15 MED ORDER — HYDRALAZINE HCL 20 MG/ML IJ SOLN
10.0000 mg | Freq: Once | INTRAMUSCULAR | Status: AC
  Administered 2023-07-15: 10 mg via INTRAVENOUS
  Filled 2023-07-15: qty 1

## 2023-07-15 MED ORDER — NITROGLYCERIN 0.4 MG SL SUBL: 0.4000 mg | SUBLINGUAL_TABLET | SUBLINGUAL | Status: DC | PRN

## 2023-07-15 MED ORDER — ASPIRIN 81 MG PO CHEW: 81.0000 mg | CHEWABLE_TABLET | ORAL | Status: AC

## 2023-07-15 MED ORDER — ONDANSETRON HCL 4 MG/2ML IJ SOLN
4.0000 mg | Freq: Four times a day (QID) | INTRAMUSCULAR | Status: AC | PRN
Start: 2023-07-15 — End: ?

## 2023-07-15 MED ORDER — ASPIRIN 81 MG PO TBEC
81.0000 mg | DELAYED_RELEASE_TABLET | Freq: Every day | ORAL | Status: DC
  Administered 2023-07-16 – 2023-07-17 (×2): 81 mg via ORAL
  Filled 2023-07-15 (×2): qty 1

## 2023-07-15 MED ORDER — SODIUM CHLORIDE 0.9 % WEIGHT BASED INFUSION: 3.0000 mL/kg/h | INTRAVENOUS | Status: AC

## 2023-07-15 MED ORDER — HYDROCHLOROTHIAZIDE 12.5 MG PO TABS
12.5000 mg | ORAL_TABLET | Freq: Every day | ORAL | Status: DC
  Administered 2023-07-16 – 2023-07-17 (×2): 12.5 mg via ORAL
  Filled 2023-07-15 (×2): qty 1

## 2023-07-15 MED ORDER — LIDOCAINE VISCOUS HCL 2 % MT SOLN
15.0000 mL | Freq: Once | OROMUCOSAL | Status: AC
  Administered 2023-07-15: 15 mL via ORAL
  Filled 2023-07-15: qty 15

## 2023-07-15 MED ORDER — SODIUM CHLORIDE 0.9 % WEIGHT BASED INFUSION: 1.0000 mL/kg/h | INTRAVENOUS | Status: AC

## 2023-07-15 MED ORDER — METOPROLOL TARTRATE 50 MG PO TABS
50.0000 mg | ORAL_TABLET | Freq: Two times a day (BID) | ORAL | Status: DC
  Administered 2023-07-15 – 2023-07-17 (×4): 50 mg via ORAL
  Filled 2023-07-15 (×4): qty 1

## 2023-07-15 MED ORDER — ASPIRIN 81 MG PO CHEW
324.0000 mg | CHEWABLE_TABLET | Freq: Once | ORAL | Status: DC
  Filled 2023-07-15: qty 4

## 2023-07-15 MED ORDER — HEPARIN (PORCINE) 25000 UT/250ML-% IV SOLN
1200.0000 [IU]/h | INTRAVENOUS | Status: DC
  Administered 2023-07-15: 1200 [IU]/h via INTRAVENOUS
  Filled 2023-07-15 (×3): qty 250

## 2023-07-15 MED ORDER — ROSUVASTATIN CALCIUM 20 MG PO TABS
20.0000 mg | ORAL_TABLET | Freq: Every day | ORAL | Status: DC
  Administered 2023-07-16 – 2023-07-17 (×2): 20 mg via ORAL
  Filled 2023-07-15 (×2): qty 1

## 2023-07-15 MED ORDER — LOSARTAN POTASSIUM 50 MG PO TABS
100.0000 mg | ORAL_TABLET | Freq: Every day | ORAL | Status: DC
  Administered 2023-07-16 – 2023-07-17 (×2): 100 mg via ORAL
  Filled 2023-07-15 (×2): qty 2

## 2023-07-15 MED ORDER — FESOTERODINE FUMARATE ER 4 MG PO TB24
4.0000 mg | ORAL_TABLET | Freq: Every day | ORAL | Status: DC
  Administered 2023-07-16 – 2023-07-19 (×4): 4 mg via ORAL
  Filled 2023-07-15 (×4): qty 1

## 2023-07-15 MED ORDER — LOSARTAN POTASSIUM-HCTZ 100-12.5 MG PO TABS: 1.0000 | ORAL_TABLET | Freq: Every day | ORAL | Status: DC

## 2023-07-15 MED ORDER — HEPARIN BOLUS VIA INFUSION
4000.0000 [IU] | Freq: Once | INTRAVENOUS | Status: AC
  Administered 2023-07-15: 4000 [IU] via INTRAVENOUS
  Filled 2023-07-15: qty 4000

## 2023-07-15 MED ORDER — INSULIN ASPART 100 UNIT/ML IJ SOLN
0.0000 [IU] | Freq: Three times a day (TID) | INTRAMUSCULAR | Status: DC
  Administered 2023-07-16: 5 [IU] via SUBCUTANEOUS

## 2023-07-15 MED ORDER — ACETAMINOPHEN 325 MG PO TABS
650.0000 mg | ORAL_TABLET | ORAL | Status: DC | PRN
  Administered 2023-07-18: 650 mg via ORAL
  Filled 2023-07-15: qty 2

## 2023-07-15 MED ORDER — ALUM & MAG HYDROXIDE-SIMETH 200-200-20 MG/5ML PO SUSP
30.0000 mL | Freq: Once | ORAL | Status: AC
  Administered 2023-07-15: 30 mL via ORAL
  Filled 2023-07-15: qty 30

## 2023-07-15 NOTE — Telephone Encounter (Signed)
 RN has attempted 3 different times to call pt. Unable to leave voicemail. Pt needs clarification on taking a medication ordered by another provider.

## 2023-07-15 NOTE — H&P (Signed)
 CARDIOLOGY ADMISSION NOTE  Patient ID: Sandra Lawrence MRN: [id number] DOB/AGE: [birth year] 78 y.o.  Admit date: 07/15/2023 Primary Physician   Ronnald Nian, MD Primary Cardiologist   Garwin Brothers, MD Chief Complaint    Chest pain  HPI:   The patient presents with chest pain.  She has a history of dyslipidemia and hypertension and diabetes.  There is some mention of her having coronary disease.  I see a cardiac cath from 2008 that demonstrates very mild LAD 30% stenosis and 50% circumflex stenosis.  I do not see a more recent cath in this.  Echo card in 2023 demonstrated well-preserved ejection fraction.  Today she called EMS because she was having chest discomfort with headache nausea and vomiting.  She was treated with sublingual nitroglycerin.  This did improve her chest pain.  Reviewing the EMS run sheet she had some PVCs.  BNP is mildly elevated.  Troponin was negative.  Blood sugar is markedly elevated.  EKG demonstrates sinus rhythm with inferior ST depression possible repolarization premature ventricular contractions.  This however is not different than previous EKG.  She reports that she has been having pain off and on for about two weeks .  It has been with activity such as doing dishes or walking with her walker but getting worse as the week went on.  It started to happen at rest today.  This is 6/10 left pressure without radiation.  Positive nausea.  She has had chronic dyspnea.  She lives alone and drives and does her chores but has to use a cane and walker because of multiple ortho issues.  She did go on a cruise last week and road around in a scooter.  She sleeps on a couch and has sleep apnea but her CPAP smells so she does not use it.    Past Medical History:  Diagnosis Date   Anemia    Anxiety    Asthma    has inhalers   CAD (coronary artery disease) 02/15/2007   Cataract associated with type 2 diabetes mellitus (HCC) 07/21/2015   DDD (degenerative disc disease),  lumbar 02/04/2016   Depression    Diabetes mellitus (HCC)    diet controlled- NO MEDS   Diverticulosis    DJD (degenerative joint disease) 03/17/2011   Essential hypertension 05/27/2021   GERD (gastroesophageal reflux disease)    History of colonic polyps 09/05/2012   Tubular adenoma   History of kidney stones    Hypercholesteremia    Morbid obesity with BMI of 40.0-44.9, adult (HCC) 03/17/2011   OAB (overactive bladder) 11/04/2016   Osteoporosis 06/01/2021   Sleep apnea    No cpap does not tolerate   Tubular adenoma of colon     Past Surgical History:  Procedure Laterality Date   BREAST EXCISIONAL BIOPSY Left    BREAST LUMPECTOMY WITH RADIOACTIVE SEED LOCALIZATION Left 10/03/2013   Procedure: BREAST LUMPECTOMY WITH RADIOACTIVE SEurgeon: Maisie Fus A. Cornett, MD;  Location: [facility];  Service: General;  Laterality: Left;"benign"   CARDIAC CATHETERIZATION  2011   CESAREAN SECTION     COLONOSCOPY  2014   movi(fair)-TA   DILATION AND CURETTAGE OF UTERUS     HEMORROIDECTOMY     POLYPECTOMY     REPLACEMENT TOTAL KNEE  2012   left   TOTAL HIP ARTHROPLASTY Right 07/24/2021   Procedure: TOTAL HIP ARTHROPLASTY;  Surgeon: Frederico Hamman, MD;  Location: [facility];  Service: Orthopedics;  Laterality: Right;   TOTAL KNEE  ARTHROPLASTY Right 06/10/2014   Procedure: RIGHT TOTAL KNEE ARTHROPLASTY;  Surgeon: Shelda Pal, MD;  Location: [facility];  Service: Orthopedics;  Laterality: Right;   TUMOR EXCISION Right    right upper arm"fatty tumor'90"    No Known Allergies No current facility-administered medications on file prior to encounter.   Current Outpatient Medications on File Prior to Encounter  Medication Sig Dispense Refill   acetaminophen (TYLENOL) 500 MG tablet Take 2 tablets (1,000 mg total) by mouth 3 (three) times daily as needed for mild pain, moderate pain, fever or headache. (Patient not taking: Reported on 06/30/2023) 30 tablet 0   albuterol (VENTOLIN HFA) 108 (90  Base) MCG/ACT inhaler INHALE 1-2 PUFFS BY MOUTH EVERY 6 HOURS AS NEEDED FOR WHEEZE OR SHORTNESS OF BREATH (Patient not taking: Reported on 06/30/2023) 8.5 each 2   Beclomethasone Dipropionate (QVAR IN) Inhale 2 puffs into the lungs daily as needed (Asthma).     Blood Glucose Monitoring Suppl DEVI 1 each by Does not apply route in the morning and at bedtime. May substitute to any manufacturer covered by patient's insurance. 1 each 0   calcium carbonate (TUMS - DOSED IN MG ELEMENTAL CALCIUM) 500 MG chewable tablet Chew 3-4 tablets by mouth daily. (Patient not taking: Reported on 06/30/2023)     Calcium Carbonate Antacid (ALKA-SELTZER ANTACID PO) Take by mouth as needed. (Patient not taking: Reported on 06/30/2023)     Fluticasone Furoate (ARNUITY ELLIPTA) 100 MCG/ACT AEPB Inhale 2 puffs into the lungs daily. (Patient not taking: Reported on 06/30/2023) 90 each 5   glucose blood (ACCU-CHEK GUIDE) test strip Use as instructed 100 each 12   Glucose Blood (BLOOD GLUCOSE TEST STRIPS) STRP 1 each by In Vitro route in the morning and at bedtime. May substitute to any manufacturer covered by patient's insurance. 180 each 2   losartan-hydrochlorothiazide (HYZAAR) 100-12.5 MG tablet Take 1 tablet by mouth daily. 90 tablet 3   metoprolol tartrate (LOPRESSOR) 50 MG tablet TAKE 1 TABLET BY MOUTH TWICE A DAY 180 tablet 0   Multiple Vitamins-Minerals (CENTRUM SILVER PO) Take 1 capsule by mouth daily.     naproxen (NAPROSYN) 500 MG tablet Take 1 tablet (500 mg total) by mouth 2 (two) times daily with a meal. (Patient not taking: Reported on 06/30/2023) 30 tablet 0   nitroGLYCERIN (NITROSTAT) 0.4 MG SL tablet PLACE 1 TABLET (0.4 MG TOTAL) UNDER THE TONGUE EVERY 5 (FIVE) MINUTES AS NEEDED FOR CHEST PAIN. (Patient not taking: Reported on 06/30/2023) 100 tablet 0   rosuvastatin (CRESTOR) 20 MG tablet TAKE 1 TABLET BY MOUTH EVERYDAY AT BEDTIME 90 tablet 0   Semaglutide,0.25 or 0.5MG /DOS, (OZEMPIC, 0.25 OR 0.5 MG/DOSE,) 2 MG/3ML  SOPN Inject 0.5 mg into the skin once a week. 3 mL 1   solifenacin (VESICARE) 10 MG tablet TAKE 1 TABLET BY MOUTH EVERY DAY 90 tablet 0   Vitamin D, Ergocalciferol, (DRISDOL) 1.25 MG (50000 UNIT) CAPS capsule Take by mouth.     Social History   Socioeconomic History   Marital status: Widowed    Spouse name: Not on file   Number of children: Not on file   Years of education: Not on file   Highest education level: Not on file  Occupational History   Occupation: Retired   Occupation: Buyer, retail  Tobacco Use   Smoking status: Former    Current packs/day: 0.00    Types: Cigarettes    Quit date: 11/10/1979    Years since quitting: 43.7   Smokeless  tobacco: Never  Vaping Use   Vaping status: Never Used  Substance and Sexual Activity   Alcohol use: Not Currently   Drug use: No   Sexual activity: Not Currently  Other Topics Concern   Not on file  Social History Narrative   Lives alone.  Divorced  twice.   Social Drivers of Corporate investment banker Strain: Low Risk  (06/14/2023)   Overall Financial Resource Strain (CARDIA)    Difficulty of Paying Living Expenses: Not hard at all  Food Insecurity: Food Insecurity Present (06/14/2023)   Hunger Vital Sign    Worried About Running Out of Food in the Last Year: Sometimes true    Ran Out of Food in the Last Year: Sometimes true  Transportation Needs: No Transportation Needs (06/14/2023)   PRAPARE - Administrator, Civil Service (Medical): No    Lack of Transportation (Non-Medical): No  Physical Activity: Inactive (06/14/2023)   Exercise Vital Sign    Days of Exercise per Week: 0 days    Minutes of Exercise per Session: 0 min  Stress: No Stress Concern Present (06/14/2023)   [employer] of Occupational Health - Occupational Stress Questionnaire    Feeling of Stress : Not at all  Social Connections: Moderately Isolated (06/14/2023)   Social Connection and Isolation Panel [NHANES]    Frequency of Communication  with Friends and Family: More than three times a week    Frequency of Social Gatherings with Friends and Family: More than three times a week    Attends Religious Services: More than 4 times per year    Active Member of [employer] or Organizations: No    Attends Banker Meetings: Never    Marital Status: Widowed  Intimate Partner Violence: Not At Risk (06/14/2023)   Humiliation, Afraid, Rape, and Kick questionnaire    Fear of Current or Ex-Partner: No    Emotionally Abused: No    Physically Abused: No    Sexually Abused: No    Family History  Adopted: Yes  Problem Relation Age of Onset   Cancer Father    Diabetes Mother    Colon cancer Neg Hx    Esophageal cancer Neg Hx    Stomach cancer Neg Hx    Rectal cancer Neg Hx    Colon polyps Neg Hx      ROS:  Positive for constipation and a rash on her stomach.  Otherwise as stated in the HPI and negative for all other systems.   Physical Exam: Blood pressure (!) 180/70, pulse 74, temperature 98.5 F (36.9 C), temperature source Oral, resp. rate 19, height 5\' 7"  (1.702 m), weight 136.1 kg, SpO2 99%.  GENERAL:  Well appearing HEENT:  Pupils equal round and reactive, fundi not visualized, oral mucosa unremarkable NECK:  No jugular venous distention, waveform within normal limits, carotid upstroke brisk and symmetric, no bruits, no thyromegaly LYMPHATICS:  No cervical, inguinal adenopathy LUNGS:  Clear to auscultation bilaterally BACK:  No CVA tenderness CHEST:  Unremarkable HEART:  PMI not displaced or sustained,S1 and S2 within normal limits, no S3, no S4, no clicks, no rubs, 2/6 apical systolic murmur, no diastolic  murmurs ABD:  Flat, positive bowel sounds normal in frequency in pitch, no bruits, no rebound, no guarding, no midline pulsatile mass, no hepatomegaly, no splenomegaly EXT:  2 plus pulses throughout, no edema, no cyanosis no clubbing SKIN:  No rashes no nodules NEURO:  Cranial nerves II through XII grossly intact,  motor grossly  intact throughout PSYCH:  Cognitively intact, oriented to person place and time   Labs: Lab Results  Component Value Date   BUN 17 07/15/2023   Lab Results  Component Value Date   CREATININE 0.85 07/15/2023   Lab Results  Component Value Date   NA 136 07/15/2023   K 4.0 07/15/2023   CL 99 07/15/2023   CO2 20 (L) 07/15/2023   Lab Results  Component Value Date   TROPONINI <0.03 06/11/2014   Lab Results  Component Value Date   WBC 10.1 07/15/2023   HGB 12.4 07/15/2023   HCT 39.5 07/15/2023   MCV 90.0 07/15/2023   PLT 235 07/15/2023   Lab Results  Component Value Date   CHOL 153 11/17/2022   HDL 53 11/17/2022   LDLCALC 82 11/17/2022   TRIG 95 11/17/2022   CHOLHDL 2.9 11/17/2022   Lab Results  Component Value Date   ALT 17 07/15/2023   AST 27 07/15/2023   ALKPHOS 75 07/15/2023   BILITOT 0.6 07/15/2023      Radiology:  CXR: No acute disease  EKG: See above  ASSESSMENT AND PLAN:    Chest pain:   Pain sounds like it could be new onset exertional and now resting unstable angina.  Admit with ASA, heparin, beta blocker.  Cath on Monday.  The patient understands that risks included but are not limited to stroke (1 in 1000), death (1 in 1000), kidney failure [usually temporary] (1 in 500), bleeding (1 in 200), allergic reaction [possibly serious] (1 in 200).  The patient understands and agrees to proceed.    Hypertension: Continue previous meds.  BP elevated here but she thinks it is OK at home.    Diabetes mellitus: Continue outpatient meds with sliding scale insulin. I doubt that this is well controlled.     SignedRollene Rotunda 07/15/2023, 6:55 PM

## 2023-07-15 NOTE — Progress Notes (Signed)
 PHARMACY - ANTICOAGULATION CONSULT NOTE  Pharmacy Consult for heparin gtt Indication: chest pain/ACS  No Known Allergies  Patient Measurements: Height: 5\' 7"  (170.2 cm) Weight: 136.1 kg (300 lb) IBW/kg (Calculated) : 61.6 Heparin Dosing Weight: 94.7 kg  Vital Signs: Temp: 98.5 F (36.9 C) (02/28 1559) Temp Source: Oral (02/28 1559) BP: 180/70 (02/28 1830) Pulse Rate: 74 (02/28 1830)  Labs: Recent Labs    07/15/23 1623  HGB 12.4  HCT 39.5  PLT 235  CREATININE 0.85  TROPONINIHS 10    Estimated Creatinine Clearance: 82.5 mL/min (by C-G formula based on SCr of 0.85 mg/dL).   Medical History: Past Medical History:  Diagnosis Date   Anemia    Anxiety    Asthma    has inhalers   CAD (coronary artery disease) 02/15/2007   Cataract associated with type 2 diabetes mellitus (HCC) 07/21/2015   DDD (degenerative disc disease), lumbar 02/04/2016   Depression    Diabetes mellitus (HCC)    diet controlled- NO MEDS   Diverticulosis    DJD (degenerative joint disease) 03/17/2011   Essential hypertension 05/27/2021   GERD (gastroesophageal reflux disease)    History of colonic polyps 09/05/2012   Tubular adenoma   History of kidney stones    Hypercholesteremia    Morbid obesity with BMI of 40.0-44.9, adult (HCC) 03/17/2011   OAB (overactive bladder) 11/04/2016   Osteoporosis 06/01/2021   Sleep apnea    No cpap does not tolerate   Tubular adenoma of colon     Medications:  (Not in a hospital admission)   Assessment: 77 YOM admitted w/ cc CP. Could be new exertional and now resting unstable angina. Patient has not been on [facility] therapy PTA based on a review of his medical record and pharmacy records. Hemoglobin and platelets are stable and WNL  Goal of Therapy:  Heparin level 0.3-0.7 units/ml Monitor platelets by anticoagulation protocol: Yes   Plan:  Give 4000 units bolus x 1 Start heparin infusion at 1200 units/hr Check anti-Xa level in 8 hours and daily while on  heparin Continue to monitor H&H and platelets  Mardy Lucier J Anuj Summons 07/15/2023,8:13 PM

## 2023-07-15 NOTE — Telephone Encounter (Signed)
  Attempted to contact patient to determine which medication she had questions about. Unable to LVM. Will continue to attempt.   Copied from CRM [id number]. Topic: Clinical - Medication Question >> Jul 15, 2023 11:55 AM Maree Krabbe H wrote: Reason for CRM: Patient is wanting someone to call her back about a rx that another doctor gave her and before she picks it up she's wanting to know if its okay to get. Patients callback number is [phone].

## 2023-07-15 NOTE — ED Triage Notes (Signed)
 Pt BIB [facility] from home for chest pain that has been going on for 2 days and gradually became worse today. Patient reports headache, nausea and vomiting. EMS gave 3 nitroglycerin with relief. Patient has history of angina and PVCs.

## 2023-07-15 NOTE — Telephone Encounter (Signed)
 Called and tried to leave voicemail, was unable. Home number incorrect.

## 2023-07-15 NOTE — ED Provider Notes (Signed)
 [facility] AT [facility] Provider Note   CSN: [id number] Arrival date & time: 07/15/23  1548     History  Chief Complaint  Patient presents with   Shortness of Breath   Chest Pain    Sandra Lawrence is a 78 y.o. female, history of PVC, hypertension, CHF, sleep apnea, who presents to the ED secondary to chest pain, has been intermittently going on for the last week.  She says that she has intervally had chest pain, for the last week, that is pressurized, associated with some nausea, and is localized to the left breast, that is worse on exertion.  She has a little bit of shortness of breath, exertion, but states is mostly due to this secondary to the chest pain.  She notes she has not had any swelling of her limbs, weight gain, or recent surgeries.  Notes that today she was at [facility], eating with her family, when it became very bad when she was walking, so she decided to come to the ER.  She did take 2 nitroglycerin, which took the pain from a 7 out of 10 to a current 2 out of 10.  Reports that she supposed to meet with a cardiologist in April 3.  Home Medications Prior to Admission medications   Medication Sig Start Date End Date Taking? Authorizing Provider  acetaminophen (TYLENOL) 500 MG tablet Take 2 tablets (1,000 mg total) by mouth 3 (three) times daily as needed for mild pain, moderate pain, fever or headache. 07/24/21  Yes Chadwell, Ivin Booty, PA-C  albuterol (VENTOLIN HFA) 108 (90 Base) MCG/ACT inhaler INHALE 1-2 PUFFS BY MOUTH EVERY 6 HOURS AS NEEDED FOR WHEEZE OR SHORTNESS OF BREATH 01/03/23  Yes Ronnald Nian, MD  calcium carbonate (TUMS - DOSED IN MG ELEMENTAL CALCIUM) 500 MG chewable tablet Chew 3-4 tablets by mouth daily.   Yes [provider]  Calcium Carbonate Antacid (ALKA-SELTZER ANTACID PO) Take by mouth as needed.   Yes [provider]  Fluticasone Furoate (ARNUITY ELLIPTA) 100 MCG/ACT AEPB Inhale 2 puffs into the lungs  daily. 04/20/23  Yes Ronnald Nian, MD  Ibuprofen (ADVIL PO) Take 2 tablets by mouth daily as needed.   Yes [provider]  losartan-hydrochlorothiazide (HYZAAR) 100-12.5 MG tablet Take 1 tablet by mouth daily. 12/23/22  Yes Ronnald Nian, MD  meloxicam (MOBIC) 15 MG tablet Take 15 mg by mouth daily. 07/14/23  Yes [provider]  metoprolol tartrate (LOPRESSOR) 50 MG tablet TAKE 1 TABLET BY MOUTH TWICE A DAY 04/12/23  Yes Ronnald Nian, MD  Multiple Vitamins-Minerals (CENTRUM SILVER PO) Take 1 capsule by mouth daily.   Yes [provider]  naproxen (NAPROSYN) 500 MG tablet Take 1 tablet (500 mg total) by mouth 2 (two) times daily with a meal. Patient taking differently: Take 500 mg by mouth daily as needed for mild pain (pain score 1-3), moderate pain (pain score 4-6) or headache. 02/27/22  Yes Eber Hong, MD  nitroGLYCERIN (NITROSTAT) 0.4 MG SL tablet PLACE 1 TABLET (0.4 MG TOTAL) UNDER THE TONGUE EVERY 5 (FIVE) MINUTES AS NEEDED FOR CHEST PAIN. 06/06/19  Yes Ronnald Nian, MD  rosuvastatin (CRESTOR) 20 MG tablet TAKE 1 TABLET BY MOUTH EVERYDAY AT BEDTIME Patient taking differently: Take 20 mg by mouth daily. 04/04/23  Yes Ronnald Nian, MD  Semaglutide,0.25 or 0.5MG /DOS, (OZEMPIC, 0.25 OR 0.5 MG/DOSE,) 2 MG/3ML SOPN Inject 0.5 mg into the skin once a week. Patient taking differently: Inject  0.5 mg into the skin once a week. Mondays 05/06/23  Yes Ronnald Nian, MD  solifenacin (VESICARE) 10 MG tablet TAKE 1 TABLET BY MOUTH EVERY DAY 05/31/23  Yes Ronnald Nian, MD  Vitamin D, Ergocalciferol, (DRISDOL) 1.25 MG (50000 UNIT) CAPS capsule Take by mouth. 01/24/23  Yes [provider]  Blood Glucose Monitoring Suppl DEVI 1 each by Does not apply route in the morning and at bedtime. May substitute to any manufacturer covered by patient's insurance. 11/17/22   Joselyn Arrow, MD  glucose blood (ACCU-CHEK GUIDE) test strip Use as instructed 03/10/21   Ronnald Nian,  MD  Glucose Blood (BLOOD GLUCOSE TEST STRIPS) STRP 1 each by In Vitro route in the morning and at bedtime. May substitute to any manufacturer covered by patient's insurance. 11/17/22 08/14/23  Joselyn Arrow, MD      Allergies    Patient has no known allergies.    Review of Systems   Review of Systems  Constitutional:  Negative for fever.  Respiratory:  Positive for shortness of breath.   Cardiovascular:  Positive for chest pain.    Physical Exam Updated Vital Signs BP (!) 180/70   Pulse 74   Temp 98.5 F (36.9 C) (Oral)   Resp 19   Ht 5\' 7"  (1.702 m)   Wt 136.1 kg   SpO2 99%   BMI 46.99 kg/m  Physical Exam Vitals and nursing note reviewed.  Constitutional:      General: She is not in acute distress.    Appearance: She is well-developed.  HENT:     Head: Normocephalic and atraumatic.  Eyes:     Conjunctiva/sclera: Conjunctivae normal.  Cardiovascular:     Rate and Rhythm: Normal rate and regular rhythm.     Heart sounds: No murmur heard. Pulmonary:     Effort: Pulmonary effort is normal. No respiratory distress.     Breath sounds: Normal breath sounds.  Abdominal:     Palpations: Abdomen is soft.     Tenderness: There is no abdominal tenderness.  Musculoskeletal:        General: No swelling.     Cervical back: Neck supple.  Skin:    General: Skin is warm and dry.     Capillary Refill: Capillary refill takes less than 2 seconds.  Neurological:     Mental Status: She is alert.  Psychiatric:        Mood and Affect: Mood normal.     ED Results / Procedures / Treatments   Labs (all labs ordered are listed, but only abnormal results are displayed) Labs Reviewed  CBC WITH DIFFERENTIAL/PLATELET - Abnormal; Notable for the following components:      Result Value   Neutro Abs 8.4 (*)    All other components within normal limits  COMPREHENSIVE METABOLIC PANEL - Abnormal; Notable for the following components:   CO2 20 (*)    Glucose, Bld 368 (*)    Albumin 3.2 (*)     Anion gap 17 (*)    All other components within normal limits  BRAIN NATRIURETIC PEPTIDE - Abnormal; Notable for the following components:   B Natriuretic Peptide 280.1 (*)    All other components within normal limits  CBG MONITORING, ED - Abnormal; Notable for the following components:   Glucose-Capillary 310 (*)    All other components within normal limits  CBC  HEPARIN LEVEL (UNFRACTIONATED)  TROPONIN I (HIGH SENSITIVITY)  TROPONIN I (HIGH SENSITIVITY)    EKG EKG Interpretation Date/Time:  Friday July 15 2023 17:56:31 EST Ventricular Rate:  71 PR Interval:  176 QRS Duration:  98 QT Interval:  389 QTC Calculation: 423 R Axis:   46  Text Interpretation: Sinus rhythm Repol abnrm suggests ischemia, lateral leads No significant change since last tracing Confirmed by Alvira Monday ([id number]) on 07/15/2023 7:52:53 PM  Radiology DG Chest 2 View Result Date: 07/15/2023 CLINICAL DATA:  Chest pain for 2 days, gradually worsening. Headache, nausea, and vomiting. EXAM: CHEST - 2 VIEW COMPARISON:  04/25/2023 FINDINGS: Heart size and pulmonary vascularity are normal for technique. Lungs are clear. No pleural effusion or pneumothorax. Mediastinal contours appear intact. Calcification of the aorta. Degenerative changes in the spine. IMPRESSION: No active cardiopulmonary disease. Electronically Signed   By: Burman Nieves M.D.   On: 07/15/2023 17:40    Procedures Procedures    Medications Ordered in ED Medications  heparin ADULT infusion 100 units/mL (25000 units/211mL) (1,200 Units/hr Intravenous New Bag/Given 07/15/23 2102)  alum & mag hydroxide-simeth (MAALOX/MYLANTA) 200-200-20 MG/5ML suspension 30 mL (30 mLs Oral Given 07/15/23 1830)    And  lidocaine (XYLOCAINE) 2 % viscous mouth solution 15 mL (15 mLs Oral Given 07/15/23 1830)  hydrALAZINE (APRESOLINE) injection 10 mg (10 mg Intravenous Given 07/15/23 1830)  heparin bolus via infusion 4,000 Units (4,000 Units Intravenous Bolus from  Bag 07/15/23 2102)    ED Course/ Medical Decision Making/ A&P             HEART Score: 7                    Medical Decision Making Patient is a 78 year old female, here for chest pain, that is been worse on exertion, has been going on for the last week.  Got worse today, she states she is supposed to have a meeting with the cardiologist on April 3, but since he got worse today, she decided to come to the ER.  She has many risk factors, for heart issues, we will obtain troponins, chest x-ray, and BNP, for further evaluation, she is already had nitroglycerin, and she states her pain is a 2 out of 10.  Amount and/or Complexity of Data Reviewed Labs: ordered.    Details: Troponin x 2 within normal limits, mildly elevated BNP, at 280 Radiology: ordered.    Details: Chest x-ray is clear ECG/medicine tests:  Decision-making details documented in ED Course. Discussion of management or test interpretation with external provider(s): Patient has a heart score of 7, elevated, and pain is now resolved.  I spoke with Dr. Caprice Red about the patient, given the high risk, and exertional chest pain, with some associated shortness of breath, he evaluated her, believes that he she meets criteria for admission, and he will admit her, start her on a heparin drip, and aspirin, the plan for intervention, on Monday  Risk OTC drugs. Prescription drug management. Decision regarding hospitalization.   Final Clinical Impression(s) / ED Diagnoses Final diagnoses:  Unstable angina [facility])    Rx / DC Orders ED Discharge Orders     None         Chantel Teti, Harley [place], PA 07/15/23 2150    Alvira Monday, MD 07/16/23 1524

## 2023-07-16 ENCOUNTER — Inpatient Hospital Stay (HOSPITAL_COMMUNITY)

## 2023-07-16 ENCOUNTER — Encounter (HOSPITAL_COMMUNITY): Payer: Self-pay | Admitting: Cardiology

## 2023-07-16 DIAGNOSIS — R079 Chest pain, unspecified: Secondary | ICD-10-CM | POA: Diagnosis not present

## 2023-07-16 DIAGNOSIS — I200 Unstable angina: Secondary | ICD-10-CM | POA: Diagnosis not present

## 2023-07-16 LAB — CBC
HCT: 37 % (ref 36.0–46.0)
Hemoglobin: 12.2 g/dL (ref 12.0–15.0)
MCH: 28.8 pg (ref 26.0–34.0)
MCHC: 33 g/dL (ref 30.0–36.0)
MCV: 87.3 fL (ref 80.0–100.0)
Platelets: 254 10*3/uL (ref 150–400)
RBC: 4.24 MIL/uL (ref 3.87–5.11)
RDW: 15.3 % (ref 11.5–15.5)
WBC: 9.2 10*3/uL (ref 4.0–10.5)
nRBC: 0 % (ref 0.0–0.2)

## 2023-07-16 LAB — LIPID PANEL
Cholesterol: 143 mg/dL (ref 0–200)
HDL: 53 mg/dL (ref >40)
LDL Cholesterol: 80 mg/dL (ref 0–99)
Total CHOL/HDL Ratio: 2.7 ratio
Triglycerides: 51 mg/dL (ref <150)
VLDL: 10 mg/dL (ref 0–40)

## 2023-07-16 LAB — BASIC METABOLIC PANEL
Anion gap: 9 (ref 5–15)
BUN: 22 mg/dL (ref 8–23)
CO2: 23 mmol/L (ref 22–32)
Calcium: 9 mg/dL (ref 8.9–10.3)
Chloride: 103 mmol/L (ref 98–111)
Creatinine, Ser: 0.75 mg/dL (ref 0.44–1.00)
GFR, Estimated: >60 mL/min (ref >60)
Glucose, Bld: 327 mg/dL — ABNORMAL HIGH (ref 70–99)
Potassium: 3.8 mmol/L (ref 3.5–5.1)
Sodium: 135 mmol/L (ref 135–145)

## 2023-07-16 LAB — HEPARIN LEVEL (UNFRACTIONATED)
Heparin Unfractionated: 0.4 [IU]/mL (ref 0.30–0.70)
Heparin Unfractionated: 0.42 [IU]/mL (ref 0.30–0.70)

## 2023-07-16 LAB — ECHOCARDIOGRAM COMPLETE
AR max vel: 2.67 cm2
AV Peak grad: 5.5 mmHg
Ao pk vel: 1.17 m/s
Area-P 1/2: 4.06 cm2
Height: 67 in
S' Lateral: 3.1 cm
Weight: 4756.8 [oz_av]

## 2023-07-16 LAB — GLUCOSE, CAPILLARY
Glucose-Capillary: 240 mg/dL — ABNORMAL HIGH (ref 70–99)
Glucose-Capillary: 255 mg/dL — ABNORMAL HIGH (ref 70–99)
Glucose-Capillary: 209 mg/dL — ABNORMAL HIGH (ref 70–99)
Glucose-Capillary: 225 mg/dL — ABNORMAL HIGH (ref 70–99)

## 2023-07-16 MED ORDER — INSULIN ASPART 100 UNIT/ML IJ SOLN
0.0000 [IU] | Freq: Every day | INTRAMUSCULAR | Status: DC
  Administered 2023-07-16: 2 [IU] via SUBCUTANEOUS
  Administered 2023-07-17: 3 [IU] via SUBCUTANEOUS

## 2023-07-16 MED ORDER — INSULIN ASPART 100 UNIT/ML IJ SOLN
0.0000 [IU] | Freq: Three times a day (TID) | INTRAMUSCULAR | Status: DC
  Administered 2023-07-16 – 2023-07-17 (×2): 5 [IU] via SUBCUTANEOUS

## 2023-07-16 MED ORDER — ORAL CARE MOUTH RINSE: 15.0000 mL | OROMUCOSAL | Status: DC | PRN

## 2023-07-16 NOTE — Progress Notes (Signed)
 PHARMACY - ANTICOAGULATION CONSULT NOTE  Pharmacy Consult for heparin gtt Indication: chest pain/ACS  No Known Allergies  Patient Measurements: Height: 5\' 7"  (170.2 cm) Weight: 134.9 kg (297 lb 4.8 oz) IBW/kg (Calculated) : 61.6 Heparin Dosing Weight: 94.7 kg  Vital Signs: Temp: 98.6 F (37 C) (03/01 0757) Temp Source: Oral (03/01 0347) BP: 168/81 (03/01 0800) Pulse Rate: 70 (03/01 0800)  Labs: Recent Labs    07/15/23 1623 07/15/23 2045 07/16/23 0418 07/16/23 1104  HGB 12.4  --  12.2  --   HCT 39.5  --  37.0  --   PLT 235  --  254  --   HEPARINUNFRC  --   --  0.40 0.42  CREATININE 0.85  --  0.75  --   TROPONINIHS 10 15  --   --     Estimated Creatinine Clearance: 87.2 mL/min (by C-G formula based on SCr of 0.75 mg/dL).   Medical History: Past Medical History:  Diagnosis Date   Anemia    Anxiety    Asthma    has inhalers   CAD (coronary artery disease) 02/15/2007   Cataract associated with type 2 diabetes mellitus (HCC) 07/21/2015   DDD (degenerative disc disease), lumbar 02/04/2016   Depression    Diabetes mellitus (HCC)    diet controlled- NO MEDS   Diverticulosis    DJD (degenerative joint disease) 03/17/2011   Essential hypertension 05/27/2021   GERD (gastroesophageal reflux disease)    History of colonic polyps 09/05/2012   Tubular adenoma   History of kidney stones    Hypercholesteremia    Morbid obesity with BMI of 40.0-44.9, adult (HCC) 03/17/2011   OAB (overactive bladder) 11/04/2016   Osteoporosis 06/01/2021   Sleep apnea    No cpap does not tolerate   Tubular adenoma of colon     Medications:  Medications Prior to Admission  Medication Sig Dispense Refill Last Dose/Taking   acetaminophen (TYLENOL) 500 MG tablet Take 2 tablets (1,000 mg total) by mouth 3 (three) times daily as needed for mild pain, moderate pain, fever or headache. 30 tablet 0 Taking As Needed   albuterol (VENTOLIN HFA) 108 (90 Base) MCG/ACT inhaler INHALE 1-2 PUFFS BY  MOUTH EVERY 6 HOURS AS NEEDED FOR WHEEZE OR SHORTNESS OF BREATH 8.5 each 2 Taking   calcium carbonate (TUMS - DOSED IN MG ELEMENTAL CALCIUM) 500 MG chewable tablet Chew 3-4 tablets by mouth daily.   07/14/2023   Calcium Carbonate Antacid (ALKA-SELTZER ANTACID PO) Take by mouth as needed.   07/14/2023   Fluticasone Furoate (ARNUITY ELLIPTA) 100 MCG/ACT AEPB Inhale 2 puffs into the lungs daily. 90 each 5 Taking   Ibuprofen (ADVIL PO) Take 2 tablets by mouth daily as needed.   Past Week   losartan-hydrochlorothiazide (HYZAAR) 100-12.5 MG tablet Take 1 tablet by mouth daily. 90 tablet 3 07/14/2023   meloxicam (MOBIC) 15 MG tablet Take 15 mg by mouth daily.   Taking   metoprolol tartrate (LOPRESSOR) 50 MG tablet TAKE 1 TABLET BY MOUTH TWICE A DAY 180 tablet 0 07/14/2023   Multiple Vitamins-Minerals (CENTRUM SILVER PO) Take 1 capsule by mouth daily.   07/14/2023   naproxen (NAPROSYN) 500 MG tablet Take 1 tablet (500 mg total) by mouth 2 (two) times daily with a meal. (Patient taking differently: Take 500 mg by mouth daily as needed for mild pain (pain score 1-3), moderate pain (pain score 4-6) or headache.) 30 tablet 0 Taking Differently   nitroGLYCERIN (NITROSTAT) 0.4 MG SL tablet PLACE 1  TABLET (0.4 MG TOTAL) UNDER THE TONGUE EVERY 5 (FIVE) MINUTES AS NEEDED FOR CHEST PAIN. 100 tablet 0 07/15/2023   rosuvastatin (CRESTOR) 20 MG tablet TAKE 1 TABLET BY MOUTH EVERYDAY AT BEDTIME (Patient taking differently: Take 20 mg by mouth daily.) 90 tablet 0 07/14/2023   Semaglutide,0.25 or 0.5MG /DOS, (OZEMPIC, 0.25 OR 0.5 MG/DOSE,) 2 MG/3ML SOPN Inject 0.5 mg into the skin once a week. (Patient taking differently: Inject 0.5 mg into the skin once a week. Mondays) 3 mL 1 Taking Differently   solifenacin (VESICARE) 10 MG tablet TAKE 1 TABLET BY MOUTH EVERY DAY 90 tablet 0 07/14/2023   Vitamin D, Ergocalciferol, (DRISDOL) 1.25 MG (50000 UNIT) CAPS capsule Take by mouth.   07/04/2023   Blood Glucose Monitoring Suppl DEVI 1 each by  Does not apply route in the morning and at bedtime. May substitute to any manufacturer covered by patient's insurance. 1 each 0    glucose blood (ACCU-CHEK GUIDE) test strip Use as instructed 100 each 12    Glucose Blood (BLOOD GLUCOSE TEST STRIPS) STRP 1 each by In Vitro route in the morning and at bedtime. May substitute to any manufacturer covered by patient's insurance. 180 each 2     Assessment: 77 YOM admitted w/ cc CP. Could be new exertional and now resting unstable angina. Patient has not been on [facility] therapy PTA based on a review of his medical record and pharmacy records.  Heparin level is therapeutic (0.42) on 1200 units/hr. No issues with infusion or s/sx of bleeding per RN. CBC stable.  Goal of Therapy:  Heparin level 0.3-0.7 units/ml Monitor platelets by anticoagulation protocol: Yes   Plan:  Continue heparin infusion at 1200 units/hr F/u daily HL & CBC Continue to monitor s/sx of bleeding  Nicole Kindred, PharmD PGY1 Pharmacy Resident 07/16/2023 12:13 PM

## 2023-07-16 NOTE — Progress Notes (Signed)
   07/16/23 2202  Provider Notification  Provider Name/Title Dr Orson Aloe  Date Provider Notified 07/16/23  Time Provider Notified 2227  Method of Notification Page  Notification Reason Other (Comment) (Patient has been having elevated BP. Latest BP 183/82. Patient denies aby symtptoms at the moment.)  Provider response No new orders  Date of Provider Response 07/16/23  Time of Provider Response 2235   Felicity Coyer, RN

## 2023-07-16 NOTE — Progress Notes (Signed)
 Rounding Note    Patient Name: Sandra Lawrence Date of Encounter: 07/16/2023  [facility] Cardiologist: Garwin Brothers, MD   Subjective   Feeling well this morning.  Had another episode of chest pain overnight that lasted 10 to 15 minutes.  Similar to location to her prior episode, though less severe.  Inpatient Medications    Scheduled Meds:  aspirin  81 mg Oral Pre-Cath   aspirin EC  81 mg Oral Daily   fesoterodine  4 mg Oral Daily   losartan  100 mg Oral Daily   And   hydrochlorothiazide  12.5 mg Oral Daily   insulin aspart  0-15 Units Subcutaneous TID WC   metoprolol tartrate  50 mg Oral BID   rosuvastatin  20 mg Oral Daily   Continuous Infusions:  sodium chloride     heparin 1,200 Units/hr (07/15/23 2102)   PRN Meds: acetaminophen, nitroGLYCERIN, ondansetron (ZOFRAN) IV, mouth rinse   Vital Signs    Vitals:   07/15/23 2334 07/16/23 0347 07/16/23 0757 07/16/23 0800  BP: (!) 168/70 (!) 147/90 (!) 160/93 (!) 168/81  Pulse: 80 67 62 70  Resp: 19 16 17    Temp: 97.9 F (36.6 C) 98.1 F (36.7 C) 98.6 F (37 C)   TempSrc: Oral Oral    SpO2: 98% 97% 98% 97%  Weight:      Height:        Intake/Output Summary (Last 24 hours) at 07/16/2023 1132 Last data filed at 07/16/2023 0836 Gross per 24 hour  Intake 122.4 ml  Output 700 ml  Net -577.6 ml      07/15/2023    9:42 PM 07/15/2023    4:01 PM 06/30/2023    9:55 AM  Last 3 Weights  Weight (lbs) 297 lb 4.8 oz 300 lb 299 lb 6.4 oz  Weight (kg) 134.854 kg 136.079 kg 135.807 kg      Telemetry    Sinus rhythm- Personally Reviewed  ECG    Sinus rhythm- Personally Reviewed  Physical Exam   GEN: No acute distress.   Neck: No JVD Cardiac: RRR, no murmurs, rubs, or gallops.  Respiratory: Clear to auscultation bilaterally. GI: Soft, nontender, non-distended  MS: No edema; No deformity. Neuro:  Nonfocal  Psych: Normal affect   Labs    High Sensitivity Troponin:   Recent Labs  Lab  07/15/23 1623 07/15/23 2045  TROPONINIHS 10 15     Chemistry Recent Labs  Lab 07/15/23 1623 07/16/23 0418  NA 136 135  K 4.0 3.8  CL 99 103  CO2 20* 23  GLUCOSE 368* 327*  BUN 17 22  CREATININE 0.85 0.75  CALCIUM 9.7 9.0  PROT 6.6  --   ALBUMIN 3.2*  --   AST 27  --   ALT 17  --   ALKPHOS 75  --   BILITOT 0.6  --   GFRNONAA >60 >60  ANIONGAP 17* 9    Lipids  Recent Labs  Lab 07/16/23 0418  CHOL 143  TRIG 51  HDL 53  LDLCALC 80  CHOLHDL 2.7    Hematology Recent Labs  Lab 07/15/23 1623 07/16/23 0418  WBC 10.1 9.2  RBC 4.39 4.24  HGB 12.4 12.2  HCT 39.5 37.0  MCV 90.0 87.3  MCH 28.2 28.8  MCHC 31.4 33.0  RDW 14.9 15.3  PLT 235 254   Thyroid No results for input(s): "TSH", "FREET4" in the last 168 hours.  BNP Recent Labs  Lab 07/15/23 1700  BNP 280.1*    DDimer No results for input(s): "DDIMER" in the last 168 hours.   Radiology    DG Chest 2 View Result Date: 07/15/2023 CLINICAL DATA:  Chest pain for 2 days, gradually worsening. Headache, nausea, and vomiting. EXAM: CHEST - 2 VIEW COMPARISON:  04/25/2023 FINDINGS: Heart size and pulmonary vascularity are normal for technique. Lungs are clear. No pleural effusion or pneumothorax. Mediastinal contours appear intact. Calcification of the aorta. Degenerative changes in the spine. IMPRESSION: No active cardiopulmonary disease. Electronically Signed   By: Burman Nieves M.D.   On: 07/15/2023 17:40    Cardiac Studies   Echo pending  Patient Profile     78 y.o. female with a history of hypertension and diabetes presented to the hospital with chest pain, plans for left heart catheterization Monday.  Assessment & Plan    1.  Chest pain: Has continued to have chest pain overnight.  Could certainly represent unstable angina.  Troponins negative.  Sandra Lawrence plan for left heart catheterization Monday.  Echo pending.  2.  Hypertension: Mildly elevated.  Continue home medications  3.  Diabetes: Continue home  medications and sliding scale insulin.  For questions or updates, please contact [facility] Please consult [email or web address] for contact info under        Signed, Chantalle Defilippo Jorja Loa, MD  07/16/2023, 11:32 AM

## 2023-07-16 NOTE — Plan of Care (Signed)
  Problem: Health Behavior/Discharge Planning: Goal: Ability to manage health-related needs will improve Outcome: Progressing   Problem: Clinical Measurements: Goal: Ability to maintain clinical measurements within normal limits will improve Outcome: Progressing Goal: Respiratory complications will improve Outcome: Progressing Goal: Cardiovascular complication will be avoided Outcome: Progressing   Problem: Activity: Goal: Risk for activity intolerance will decrease Outcome: Progressing   Problem: Pain Managment: Goal: General experience of comfort will improve and/or be controlled Outcome: Progressing   Problem: Safety: Goal: Ability to remain free from injury will improve Outcome: Progressing   Problem: Activity: Goal: Ability to tolerate increased activity will improve Outcome: Progressing   Problem: Cardiac: Goal: Ability to achieve and maintain adequate cardiovascular perfusion will improve Outcome: Progressing

## 2023-07-16 NOTE — Progress Notes (Signed)
 Echocardiogram 2D Echocardiogram has been performed.  Annelies Coyt N Reinhard Schack,RDCS 07/16/2023, 10:03 AM

## 2023-07-16 NOTE — Progress Notes (Signed)
 PHARMACY - ANTICOAGULATION CONSULT NOTE  Pharmacy Consult for heparin Indication: chest pain/ACS  Labs: Recent Labs    07/15/23 1623 07/15/23 2045 07/16/23 0418  HGB 12.4  --  12.2  HCT 39.5  --  37.0  PLT 235  --  254  HEPARINUNFRC  --   --  0.40  CREATININE 0.85  --  0.75  TROPONINIHS 10 15  --    Assessment/Plan:  77yo female therapeutic on heparin with initial dosing for CP. Will continue infusion at current rate of 1200 units/hr and confirm stable with additional level.  Vernard Gambles, PharmD, BCPS 07/16/2023 5:37 AM

## 2023-07-17 DIAGNOSIS — I200 Unstable angina: Secondary | ICD-10-CM | POA: Diagnosis not present

## 2023-07-17 LAB — CBC
HCT: 38.4 % (ref 36.0–46.0)
Hemoglobin: 12.3 g/dL (ref 12.0–15.0)
MCH: 28.2 pg (ref 26.0–34.0)
MCHC: 32 g/dL (ref 30.0–36.0)
MCV: 88.1 fL (ref 80.0–100.0)
Platelets: 239 10*3/uL (ref 150–400)
RBC: 4.36 MIL/uL (ref 3.87–5.11)
RDW: 15.1 % (ref 11.5–15.5)
WBC: 8.5 10*3/uL (ref 4.0–10.5)
nRBC: 0 % (ref 0.0–0.2)

## 2023-07-17 LAB — GLUCOSE, CAPILLARY
Glucose-Capillary: 220 mg/dL — ABNORMAL HIGH (ref 70–99)
Glucose-Capillary: 191 mg/dL — ABNORMAL HIGH (ref 70–99)
Glucose-Capillary: 156 mg/dL — ABNORMAL HIGH (ref 70–99)
Glucose-Capillary: 287 mg/dL — ABNORMAL HIGH (ref 70–99)

## 2023-07-17 LAB — HEPARIN LEVEL (UNFRACTIONATED): Heparin Unfractionated: 0.42 [IU]/mL (ref 0.30–0.70)

## 2023-07-17 MED ORDER — CARVEDILOL 12.5 MG PO TABS
12.5000 mg | ORAL_TABLET | Freq: Two times a day (BID) | ORAL | Status: DC
  Administered 2023-07-17 – 2023-07-19 (×4): 12.5 mg via ORAL
  Filled 2023-07-17 (×4): qty 1

## 2023-07-17 MED ORDER — INSULIN ASPART 100 UNIT/ML IJ SOLN
0.0000 [IU] | Freq: Three times a day (TID) | INTRAMUSCULAR | Status: DC
  Administered 2023-07-17 (×2): 4 [IU] via SUBCUTANEOUS
  Administered 2023-07-18: 3 [IU] via SUBCUTANEOUS
  Administered 2023-07-18: 4 [IU] via SUBCUTANEOUS
  Administered 2023-07-18: 7 [IU] via SUBCUTANEOUS
  Administered 2023-07-19: 4 [IU] via SUBCUTANEOUS
  Administered 2023-07-19: 7 [IU] via SUBCUTANEOUS

## 2023-07-17 MED ORDER — IRBESARTAN 150 MG PO TABS
150.0000 mg | ORAL_TABLET | Freq: Every day | ORAL | Status: DC
  Administered 2023-07-17: 150 mg via ORAL
  Filled 2023-07-17: qty 1

## 2023-07-17 MED ORDER — CHLORTHALIDONE 25 MG PO TABS
25.0000 mg | ORAL_TABLET | Freq: Every day | ORAL | Status: DC
  Administered 2023-07-17 – 2023-07-19 (×3): 25 mg via ORAL
  Filled 2023-07-17 (×3): qty 1

## 2023-07-17 NOTE — Plan of Care (Signed)
  Problem: Clinical Measurements: Goal: Respiratory complications will improve Outcome: Progressing Goal: Cardiovascular complication will be avoided Outcome: Progressing   Problem: Activity: Goal: Risk for activity intolerance will decrease Outcome: Progressing   Problem: Nutrition: Goal: Adequate nutrition will be maintained Outcome: Progressing   Problem: Elimination: Goal: Will not experience complications related to urinary retention Outcome: Progressing   Problem: Safety: Goal: Ability to remain free from injury will improve Outcome: Progressing   Problem: Activity: Goal: Ability to tolerate increased activity will improve Outcome: Progressing   Problem: Cardiac: Goal: Ability to achieve and maintain adequate cardiovascular perfusion will improve Outcome: Progressing   Problem: Activity: Goal: Ability to return to baseline activity level will improve Outcome: Progressing   Problem: Cardiovascular: Goal: Ability to achieve and maintain adequate cardiovascular perfusion will improve Outcome: Progressing   Problem: Cardiac: Goal: Ability to achieve and maintain adequate cardiovascular perfusion will improve Outcome: Progressing

## 2023-07-17 NOTE — H&P (View-Only) (Signed)
 Rounding Note    Patient Name: Sandra Lawrence Date of Encounter: 07/17/2023  [facility] Cardiologist: Garwin Brothers, MD   Subjective   Feeling well without acute complaint  Inpatient Medications    Scheduled Meds:  aspirin EC  81 mg Oral Daily   chlorthalidone  25 mg Oral Daily   fesoterodine  4 mg Oral Daily   insulin aspart  0-15 Units Subcutaneous TID WC   insulin aspart  0-5 Units Subcutaneous QHS   irbesartan  150 mg Oral Daily   metoprolol tartrate  50 mg Oral BID   rosuvastatin  20 mg Oral Daily   Continuous Infusions:  heparin 1,200 Units/hr (07/15/23 2102)   PRN Meds: acetaminophen, nitroGLYCERIN, ondansetron (ZOFRAN) IV, mouth rinse   Vital Signs    Vitals:   07/16/23 2202 07/16/23 2224 07/17/23 0004 07/17/23 0344  BP: (!) 191/91 (!) 183/82 (!) 168/91 (!) 189/101  Pulse: 67 69 63 61  Resp:  18  16  Temp:  98.3 F (36.8 C)  98.3 F (36.8 C)  TempSrc:  Oral  Oral  SpO2: 96% 98% 97% 99%  Weight:      Height:        Intake/Output Summary (Last 24 hours) at 07/17/2023 0912 Last data filed at 07/17/2023 0349 Gross per 24 hour  Intake 240 ml  Output 1 ml  Net 239 ml      07/15/2023    9:42 PM 07/15/2023    4:01 PM 06/30/2023    9:55 AM  Last 3 Weights  Weight (lbs) 297 lb 4.8 oz 300 lb 299 lb 6.4 oz  Weight (kg) 134.854 kg 136.079 kg 135.807 kg      Telemetry    Sinus rhythm- Personally Reviewed  ECG    None new- Personally Reviewed  Physical Exam   GEN: No acute distress.   Neck: No JVD Cardiac: RRR, no murmurs, rubs, or gallops.  Respiratory: Clear to auscultation bilaterally. GI: Soft, nontender, non-distended  MS: No edema; No deformity. Neuro:  Nonfocal  Psych: Normal affect   Labs    High Sensitivity Troponin:   Recent Labs  Lab 07/15/23 1623 07/15/23 2045  TROPONINIHS 10 15     Chemistry Recent Labs  Lab 07/15/23 1623 07/16/23 0418  NA 136 135  K 4.0 3.8  CL 99 103  CO2 20* 23  GLUCOSE 368* 327*   BUN 17 22  CREATININE 0.85 0.75  CALCIUM 9.7 9.0  PROT 6.6  --   ALBUMIN 3.2*  --   AST 27  --   ALT 17  --   ALKPHOS 75  --   BILITOT 0.6  --   GFRNONAA >60 >60  ANIONGAP 17* 9    Lipids  Recent Labs  Lab 07/16/23 0418  CHOL 143  TRIG 51  HDL 53  LDLCALC 80  CHOLHDL 2.7    Hematology Recent Labs  Lab 07/15/23 1623 07/16/23 0418 07/17/23 0353  WBC 10.1 9.2 8.5  RBC 4.39 4.24 4.36  HGB 12.4 12.2 12.3  HCT 39.5 37.0 38.4  MCV 90.0 87.3 88.1  MCH 28.2 28.8 28.2  MCHC 31.4 33.0 32.0  RDW 14.9 15.3 15.1  PLT 235 254 239   Thyroid No results for input(s): "TSH", "FREET4" in the last 168 hours.  BNP Recent Labs  Lab 07/15/23 1700  BNP 280.1*    DDimer No results for input(s): "DDIMER" in the last 168 hours.   Radiology    ECHOCARDIOGRAM COMPLETE  Result Date: 07/16/2023    ECHOCARDIOGRAM REPORT   Patient Name:   Sandra Lawrence [facility] Date of Exam: 07/16/2023 Medical Rec #:  [id number]      Height:       67.0 in Accession #:    [id number]     Weight:       297.3 lb Date of Birth:  [birth year]     BSA:          2.393 m Patient Age:    78 years       BP:           147/90 mmHg Patient Gender: F              HR:           67 bpm. Exam Location:  Inpatient Procedure: 2D Echo, 3D Echo, Color Doppler and Cardiac Doppler (Both Spectral            and Color Flow Doppler were utilized during procedure). Indications:    Chest pain  History:        Patient has prior history of Echocardiogram examinations, most                 recent 06/17/2021. CHF, CAD, Arrythmias:PVC,                 Signs/Symptoms:Palptitations; Risk Factors:Sleep Apnea,                 Hypertension, Dyslipidemia, Diabetes and Former Smoker.  Sonographer:    Raeford Razor RDCS Referring Phys: [id number] CHRISTOPHER RONALD BERGE IMPRESSIONS  1. Left ventricular ejection fraction, by estimation, is 60 to 65%. Left ventricular ejection fraction by 3D volume is 60 %. The left ventricle has normal function. The left ventricle has no regional  wall motion abnormalities. There is mild left ventricular hypertrophy. Left ventricular diastolic parameters are indeterminate.  2. Right ventricular systolic function is normal. The right ventricular size is normal. There is normal pulmonary artery systolic pressure. The estimated right ventricular systolic pressure is 30.9 mmHg.  3. Left atrial size was mildly dilated.  4. The mitral valve is normal in structure. Trivial mitral valve regurgitation. No evidence of mitral stenosis.  5. The aortic valve is tricuspid. Aortic valve regurgitation is not visualized. No aortic stenosis is present.  6. The inferior vena cava is normal in size with greater than 50% respiratory variability, suggesting right atrial pressure of 3 mmHg. FINDINGS  Left Ventricle: Left ventricular ejection fraction, by estimation, is 60 to 65%. Left ventricular ejection fraction by 3D volume is 60 %. The left ventricle has normal function. The left ventricle has no regional wall motion abnormalities. Strain imaging was not performed. The left ventricular internal cavity size was normal in size. There is mild left ventricular hypertrophy. Left ventricular diastolic parameters are indeterminate. Right Ventricle: The right ventricular size is normal. No increase in right ventricular wall thickness. Right ventricular systolic function is normal. There is normal pulmonary artery systolic pressure. The tricuspid regurgitant velocity is 2.64 m/s, and  with an assumed right atrial pressure of 3 mmHg, the estimated right ventricular systolic pressure is 30.9 mmHg. Left Atrium: Left atrial size was mildly dilated. Right Atrium: Right atrial size was normal in size. Pericardium: There is no evidence of pericardial effusion. Mitral Valve: The mitral valve is normal in structure. Trivial mitral valve regurgitation. No evidence of mitral valve stenosis. Tricuspid Valve: The tricuspid valve is normal in structure. Tricuspid valve regurgitation is trivial.  Aortic Valve:  The aortic valve is tricuspid. Aortic valve regurgitation is not visualized. No aortic stenosis is present. Aortic valve peak gradient measures 5.5 mmHg. Pulmonic Valve: The pulmonic valve was not well visualized. Pulmonic valve regurgitation is trivial. Aorta: The aortic root is normal in size and structure. Venous: The inferior vena cava is normal in size with greater than 50% respiratory variability, suggesting right atrial pressure of 3 mmHg. IAS/Shunts: The interatrial septum was not well visualized. Additional Comments: 3D was performed not requiring image post processing on an independent workstation and was normal.  LEFT VENTRICLE PLAX 2D LVIDd:         5.30 cm         Diastology LVIDs:         3.10 cm         LV e' medial:    6.53 cm/s LV PW:         1.10 cm         LV E/e' medial:  14.3 LV IVS:        0.90 cm         LV e' lateral:   8.38 cm/s LVOT diam:     1.90 cm         LV E/e' lateral: 11.1 LVOT Area:     2.84 cm                                 3D Volume EF                                LV 3D EF:    Left                                             ventricul                                             ar                                             ejection                                             fraction                                             by 3D                                             volume is  60 %.                                 3D Volume EF:                                3D EF:        60 %                                LV EDV:       145 ml                                LV ESV:       58 ml                                LV SV:        87 ml RIGHT VENTRICLE             IVC RV Basal diam:  3.30 cm     IVC diam: 2.00 cm RV Mid diam:    2.10 cm RV S prime:     11.50 cm/s TAPSE (M-mode): 2.4 cm LEFT ATRIUM              Index        RIGHT ATRIUM           Index LA diam:        4.90 cm  2.05 cm/m   RA Area:     16.60 cm LA Vol  (A2C):   103.0 ml 43.04 ml/m  RA Volume:   43.60 ml  18.22 ml/m LA Vol (A4C):   90.5 ml  37.81 ml/m LA Biplane Vol: 97.7 ml  40.82 ml/m  AORTIC VALVE AV Area (Vmax): 2.67 cm AV Vmax:        117.00 cm/s AV Peak Grad:   5.5 mmHg LVOT Vmax:      110.00 cm/s  AORTA Ao Root diam: 3.00 cm Ao Asc diam:  3.70 cm MITRAL VALVE               TRICUSPID VALVE MV Area (PHT): 4.06 cm    TR Peak grad:   27.9 mmHg MV Decel Time: 187 msec    TR Vmax:        264.00 cm/s MV E velocity: 93.30 cm/s MV A velocity: 73.90 cm/s  SHUNTS MV E/A ratio:  1.26        Systemic Diam: 1.90 cm Epifanio Lesches MD Electronically signed by Epifanio Lesches MD Signature Date/Time: 07/16/2023/1:05:55 PM    Final    DG Chest 2 View Result Date: 07/15/2023 CLINICAL DATA:  Chest pain for 2 days, gradually worsening. Headache, nausea, and vomiting. EXAM: CHEST - 2 VIEW COMPARISON:  04/25/2023 FINDINGS: Heart size and pulmonary vascularity are normal for technique. Lungs are clear. No pleural effusion or pneumothorax. Mediastinal contours appear intact. Calcification of the aorta. Degenerative changes in the spine. IMPRESSION: No active cardiopulmonary disease. Electronically Signed   By: Burman Nieves M.D.   On: 07/15/2023 17:40    Cardiac Studies   TTE  1. Left ventricular ejection fraction, by estimation, is 60 to 65%. Left  ventricular ejection fraction by 3D volume is 60 %. The left ventricle has  normal function. The left ventricle has no regional wall motion  abnormalities. There is mild left  ventricular hypertrophy. Left ventricular diastolic parameters are  indeterminate.   2. Right ventricular systolic function is normal. The right ventricular  size is normal. There is normal pulmonary artery systolic pressure. The  estimated right ventricular systolic pressure is 30.9 mmHg.   3. Left atrial size was mildly dilated.   4. The mitral valve is normal in structure. Trivial mitral valve  regurgitation. No evidence of  mitral stenosis.   5. The aortic valve is tricuspid. Aortic valve regurgitation is not  visualized. No aortic stenosis is present.   6. The inferior vena cava is normal in size with greater than 50%  respiratory variability, suggesting right atrial pressure of 3 mmHg.    Patient Profile     78 y.o. female history of hypertension and diabetes presented to the hospital with chest pain  Assessment & Plan    1.  Chest pain: Troponins negative.  Her pain is quite consistent with unstable angina.  Plan for left heart catheterization tomorrow.  Echo with a normal ejection fraction.  2.  Type 2 diabetes: Continue home medications and sliding scale insulin  3.  Hypertension: Blood pressure is significantly elevated in the hospital.  There is also elevated at home, consistently in the 160s.  [place] Hanlon stop losartan and start irbesartan.  Stop hydrochlorothiazide and start chlorthalidone.  Stop metoprolol and start carvedilol.  Sherrol Vicars likely need a fourth medication.  For questions or updates, please contact [facility] Please consult [email or web address] for contact info under        Signed, Liem Copenhaver Jorja Loa, MD  07/17/2023, 9:12 AM

## 2023-07-17 NOTE — Progress Notes (Signed)
 PHARMACY - ANTICOAGULATION CONSULT NOTE  Pharmacy Consult for heparin gtt Indication: chest pain/ACS  No Known Allergies  Patient Measurements: Height: 5\' 7"  (170.2 cm) Weight: 134.9 kg (297 lb 4.8 oz) IBW/kg (Calculated) : 61.6 Heparin Dosing Weight: 94.7 kg  Vital Signs: Temp: 98.3 F (36.8 C) (03/02 0344) Temp Source: Oral (03/02 0344) BP: 189/101 (03/02 0344) Pulse Rate: 61 (03/02 0344)  Labs: Recent Labs    07/15/23 1623 07/15/23 2045 07/16/23 0418 07/16/23 1104 07/17/23 0353  HGB 12.4  --  12.2  --  12.3  HCT 39.5  --  37.0  --  38.4  PLT 235  --  254  --  239  HEPARINUNFRC  --   --  0.40 0.42 0.42  CREATININE 0.85  --  0.75  --   --   TROPONINIHS 10 15  --   --   --     Estimated Creatinine Clearance: 87.2 mL/min (by C-G formula based on SCr of 0.75 mg/dL).   Medical History: Past Medical History:  Diagnosis Date   Anemia    Anxiety    Asthma    has inhalers   CAD (coronary artery disease) 02/15/2007   Cataract associated with type 2 diabetes mellitus (HCC) 07/21/2015   DDD (degenerative disc disease), lumbar 02/04/2016   Depression    Diabetes mellitus (HCC)    diet controlled- NO MEDS   Diverticulosis    DJD (degenerative joint disease) 03/17/2011   Essential hypertension 05/27/2021   GERD (gastroesophageal reflux disease)    History of colonic polyps 09/05/2012   Tubular adenoma   History of kidney stones    Hypercholesteremia    Morbid obesity with BMI of 40.0-44.9, adult (HCC) 03/17/2011   OAB (overactive bladder) 11/04/2016   Osteoporosis 06/01/2021   Sleep apnea    No cpap does not tolerate   Tubular adenoma of colon     Medications:  Medications Prior to Admission  Medication Sig Dispense Refill Last Dose/Taking   acetaminophen (TYLENOL) 500 MG tablet Take 2 tablets (1,000 mg total) by mouth 3 (three) times daily as needed for mild pain, moderate pain, fever or headache. 30 tablet 0 Taking As Needed   albuterol (VENTOLIN HFA) 108  (90 Base) MCG/ACT inhaler INHALE 1-2 PUFFS BY MOUTH EVERY 6 HOURS AS NEEDED FOR WHEEZE OR SHORTNESS OF BREATH 8.5 each 2 Taking   calcium carbonate (TUMS - DOSED IN MG ELEMENTAL CALCIUM) 500 MG chewable tablet Chew 3-4 tablets by mouth daily.   07/14/2023   Calcium Carbonate Antacid (ALKA-SELTZER ANTACID PO) Take by mouth as needed.   07/14/2023   Fluticasone Furoate (ARNUITY ELLIPTA) 100 MCG/ACT AEPB Inhale 2 puffs into the lungs daily. 90 each 5 Taking   Ibuprofen (ADVIL PO) Take 2 tablets by mouth daily as needed.   Past Week   losartan-hydrochlorothiazide (HYZAAR) 100-12.5 MG tablet Take 1 tablet by mouth daily. 90 tablet 3 07/14/2023   meloxicam (MOBIC) 15 MG tablet Take 15 mg by mouth daily.   Taking   metoprolol tartrate (LOPRESSOR) 50 MG tablet TAKE 1 TABLET BY MOUTH TWICE A DAY 180 tablet 0 07/14/2023   Multiple Vitamins-Minerals (CENTRUM SILVER PO) Take 1 capsule by mouth daily.   07/14/2023   naproxen (NAPROSYN) 500 MG tablet Take 1 tablet (500 mg total) by mouth 2 (two) times daily with a meal. (Patient taking differently: Take 500 mg by mouth daily as needed for mild pain (pain score 1-3), moderate pain (pain score 4-6) or headache.) 30 tablet 0  Taking Differently   nitroGLYCERIN (NITROSTAT) 0.4 MG SL tablet PLACE 1 TABLET (0.4 MG TOTAL) UNDER THE TONGUE EVERY 5 (FIVE) MINUTES AS NEEDED FOR CHEST PAIN. 100 tablet 0 07/15/2023   rosuvastatin (CRESTOR) 20 MG tablet TAKE 1 TABLET BY MOUTH EVERYDAY AT BEDTIME (Patient taking differently: Take 20 mg by mouth daily.) 90 tablet 0 07/14/2023   Semaglutide,0.25 or 0.5MG /DOS, (OZEMPIC, 0.25 OR 0.5 MG/DOSE,) 2 MG/3ML SOPN Inject 0.5 mg into the skin once a week. (Patient taking differently: Inject 0.5 mg into the skin once a week. Mondays) 3 mL 1 Taking Differently   solifenacin (VESICARE) 10 MG tablet TAKE 1 TABLET BY MOUTH EVERY DAY 90 tablet 0 07/14/2023   Vitamin D, Ergocalciferol, (DRISDOL) 1.25 MG (50000 UNIT) CAPS capsule Take by mouth.   07/04/2023    Blood Glucose Monitoring Suppl DEVI 1 each by Does not apply route in the morning and at bedtime. May substitute to any manufacturer covered by patient's insurance. 1 each 0    glucose blood (ACCU-CHEK GUIDE) test strip Use as instructed 100 each 12    Glucose Blood (BLOOD GLUCOSE TEST STRIPS) STRP 1 each by In Vitro route in the morning and at bedtime. May substitute to any manufacturer covered by patient's insurance. 180 each 2     Assessment: 77 YOM admitted w/ cc CP. Could be new exertional and now resting unstable angina. Patient has not been on [facility] therapy PTA based on a review of his medical record and pharmacy records.  Heparin level is therapeutic (0.42) on 1200 units/hr. No issues with infusion or s/sx of bleeding per RN. CBC stable. Planning for [facility] tomorrow 3/3.  Goal of Therapy:  Heparin level 0.3-0.7 units/ml Monitor platelets by anticoagulation protocol: Yes   Plan:  Continue heparin infusion at 1200 units/hr F/u daily HL & CBC Continue to monitor s/sx of bleeding  Nicole Kindred, PharmD PGY1 Pharmacy Resident 07/17/2023 8:59 AM

## 2023-07-17 NOTE — Progress Notes (Signed)
 Rounding Note    Patient Name: Sandra Lawrence Date of Encounter: 07/17/2023  [facility] Cardiologist: Garwin Brothers, MD   Subjective   Feeling well without acute complaint  Inpatient Medications    Scheduled Meds:  aspirin EC  81 mg Oral Daily   chlorthalidone  25 mg Oral Daily   fesoterodine  4 mg Oral Daily   insulin aspart  0-15 Units Subcutaneous TID WC   insulin aspart  0-5 Units Subcutaneous QHS   irbesartan  150 mg Oral Daily   metoprolol tartrate  50 mg Oral BID   rosuvastatin  20 mg Oral Daily   Continuous Infusions:  heparin 1,200 Units/hr (07/15/23 2102)   PRN Meds: acetaminophen, nitroGLYCERIN, ondansetron (ZOFRAN) IV, mouth rinse   Vital Signs    Vitals:   07/16/23 2202 07/16/23 2224 07/17/23 0004 07/17/23 0344  BP: (!) 191/91 (!) 183/82 (!) 168/91 (!) 189/101  Pulse: 67 69 63 61  Resp:  18  16  Temp:  98.3 F (36.8 C)  98.3 F (36.8 C)  TempSrc:  Oral  Oral  SpO2: 96% 98% 97% 99%  Weight:      Height:        Intake/Output Summary (Last 24 hours) at 07/17/2023 0912 Last data filed at 07/17/2023 0349 Gross per 24 hour  Intake 240 ml  Output 1 ml  Net 239 ml      07/15/2023    9:42 PM 07/15/2023    4:01 PM 06/30/2023    9:55 AM  Last 3 Weights  Weight (lbs) 297 lb 4.8 oz 300 lb 299 lb 6.4 oz  Weight (kg) 134.854 kg 136.079 kg 135.807 kg      Telemetry    Sinus rhythm- Personally Reviewed  ECG    None new- Personally Reviewed  Physical Exam   GEN: No acute distress.   Neck: No JVD Cardiac: RRR, no murmurs, rubs, or gallops.  Respiratory: Clear to auscultation bilaterally. GI: Soft, nontender, non-distended  MS: No edema; No deformity. Neuro:  Nonfocal  Psych: Normal affect   Labs    High Sensitivity Troponin:   Recent Labs  Lab 07/15/23 1623 07/15/23 2045  TROPONINIHS 10 15     Chemistry Recent Labs  Lab 07/15/23 1623 07/16/23 0418  NA 136 135  K 4.0 3.8  CL 99 103  CO2 20* 23  GLUCOSE 368* 327*   BUN 17 22  CREATININE 0.85 0.75  CALCIUM 9.7 9.0  PROT 6.6  --   ALBUMIN 3.2*  --   AST 27  --   ALT 17  --   ALKPHOS 75  --   BILITOT 0.6  --   GFRNONAA >60 >60  ANIONGAP 17* 9    Lipids  Recent Labs  Lab 07/16/23 0418  CHOL 143  TRIG 51  HDL 53  LDLCALC 80  CHOLHDL 2.7    Hematology Recent Labs  Lab 07/15/23 1623 07/16/23 0418 07/17/23 0353  WBC 10.1 9.2 8.5  RBC 4.39 4.24 4.36  HGB 12.4 12.2 12.3  HCT 39.5 37.0 38.4  MCV 90.0 87.3 88.1  MCH 28.2 28.8 28.2  MCHC 31.4 33.0 32.0  RDW 14.9 15.3 15.1  PLT 235 254 239   Thyroid No results for input(s): "TSH", "FREET4" in the last 168 hours.  BNP Recent Labs  Lab 07/15/23 1700  BNP 280.1*    DDimer No results for input(s): "DDIMER" in the last 168 hours.   Radiology    ECHOCARDIOGRAM COMPLETE  Result Date: 07/16/2023    ECHOCARDIOGRAM REPORT   Patient Name:   Sandra Lawrence [facility] Date of Exam: 07/16/2023 Medical Rec #:  [id number]      Height:       67.0 in Accession #:    [id number]     Weight:       297.3 lb Date of Birth:  [birth year]     BSA:          2.393 m Patient Age:    78 years       BP:           147/90 mmHg Patient Gender: F              HR:           67 bpm. Exam Location:  Inpatient Procedure: 2D Echo, 3D Echo, Color Doppler and Cardiac Doppler (Both Spectral            and Color Flow Doppler were utilized during procedure). Indications:    Chest pain  History:        Patient has prior history of Echocardiogram examinations, most                 recent 06/17/2021. CHF, CAD, Arrythmias:PVC,                 Signs/Symptoms:Palptitations; Risk Factors:Sleep Apnea,                 Hypertension, Dyslipidemia, Diabetes and Former Smoker.  Sonographer:    Raeford Razor RDCS Referring Phys: [id number] CHRISTOPHER RONALD BERGE IMPRESSIONS  1. Left ventricular ejection fraction, by estimation, is 60 to 65%. Left ventricular ejection fraction by 3D volume is 60 %. The left ventricle has normal function. The left ventricle has no regional  wall motion abnormalities. There is mild left ventricular hypertrophy. Left ventricular diastolic parameters are indeterminate.  2. Right ventricular systolic function is normal. The right ventricular size is normal. There is normal pulmonary artery systolic pressure. The estimated right ventricular systolic pressure is 30.9 mmHg.  3. Left atrial size was mildly dilated.  4. The mitral valve is normal in structure. Trivial mitral valve regurgitation. No evidence of mitral stenosis.  5. The aortic valve is tricuspid. Aortic valve regurgitation is not visualized. No aortic stenosis is present.  6. The inferior vena cava is normal in size with greater than 50% respiratory variability, suggesting right atrial pressure of 3 mmHg. FINDINGS  Left Ventricle: Left ventricular ejection fraction, by estimation, is 60 to 65%. Left ventricular ejection fraction by 3D volume is 60 %. The left ventricle has normal function. The left ventricle has no regional wall motion abnormalities. Strain imaging was not performed. The left ventricular internal cavity size was normal in size. There is mild left ventricular hypertrophy. Left ventricular diastolic parameters are indeterminate. Right Ventricle: The right ventricular size is normal. No increase in right ventricular wall thickness. Right ventricular systolic function is normal. There is normal pulmonary artery systolic pressure. The tricuspid regurgitant velocity is 2.64 m/s, and  with an assumed right atrial pressure of 3 mmHg, the estimated right ventricular systolic pressure is 30.9 mmHg. Left Atrium: Left atrial size was mildly dilated. Right Atrium: Right atrial size was normal in size. Pericardium: There is no evidence of pericardial effusion. Mitral Valve: The mitral valve is normal in structure. Trivial mitral valve regurgitation. No evidence of mitral valve stenosis. Tricuspid Valve: The tricuspid valve is normal in structure. Tricuspid valve regurgitation is trivial.  Aortic Valve:  The aortic valve is tricuspid. Aortic valve regurgitation is not visualized. No aortic stenosis is present. Aortic valve peak gradient measures 5.5 mmHg. Pulmonic Valve: The pulmonic valve was not well visualized. Pulmonic valve regurgitation is trivial. Aorta: The aortic root is normal in size and structure. Venous: The inferior vena cava is normal in size with greater than 50% respiratory variability, suggesting right atrial pressure of 3 mmHg. IAS/Shunts: The interatrial septum was not well visualized. Additional Comments: 3D was performed not requiring image post processing on an independent workstation and was normal.  LEFT VENTRICLE PLAX 2D LVIDd:         5.30 cm         Diastology LVIDs:         3.10 cm         LV e' medial:    6.53 cm/s LV PW:         1.10 cm         LV E/e' medial:  14.3 LV IVS:        0.90 cm         LV e' lateral:   8.38 cm/s LVOT diam:     1.90 cm         LV E/e' lateral: 11.1 LVOT Area:     2.84 cm                                 3D Volume EF                                LV 3D EF:    Left                                             ventricul                                             ar                                             ejection                                             fraction                                             by 3D                                             volume is  60 %.                                 3D Volume EF:                                3D EF:        60 %                                LV EDV:       145 ml                                LV ESV:       58 ml                                LV SV:        87 ml RIGHT VENTRICLE             IVC RV Basal diam:  3.30 cm     IVC diam: 2.00 cm RV Mid diam:    2.10 cm RV S prime:     11.50 cm/s TAPSE (M-mode): 2.4 cm LEFT ATRIUM              Index        RIGHT ATRIUM           Index LA diam:        4.90 cm  2.05 cm/m   RA Area:     16.60 cm LA Vol  (A2C):   103.0 ml 43.04 ml/m  RA Volume:   43.60 ml  18.22 ml/m LA Vol (A4C):   90.5 ml  37.81 ml/m LA Biplane Vol: 97.7 ml  40.82 ml/m  AORTIC VALVE AV Area (Vmax): 2.67 cm AV Vmax:        117.00 cm/s AV Peak Grad:   5.5 mmHg LVOT Vmax:      110.00 cm/s  AORTA Ao Root diam: 3.00 cm Ao Asc diam:  3.70 cm MITRAL VALVE               TRICUSPID VALVE MV Area (PHT): 4.06 cm    TR Peak grad:   27.9 mmHg MV Decel Time: 187 msec    TR Vmax:        264.00 cm/s MV E velocity: 93.30 cm/s MV A velocity: 73.90 cm/s  SHUNTS MV E/A ratio:  1.26        Systemic Diam: 1.90 cm Epifanio Lesches MD Electronically signed by Epifanio Lesches MD Signature Date/Time: 07/16/2023/1:05:55 PM    Final    DG Chest 2 View Result Date: 07/15/2023 CLINICAL DATA:  Chest pain for 2 days, gradually worsening. Headache, nausea, and vomiting. EXAM: CHEST - 2 VIEW COMPARISON:  04/25/2023 FINDINGS: Heart size and pulmonary vascularity are normal for technique. Lungs are clear. No pleural effusion or pneumothorax. Mediastinal contours appear intact. Calcification of the aorta. Degenerative changes in the spine. IMPRESSION: No active cardiopulmonary disease. Electronically Signed   By: Burman Nieves M.D.   On: 07/15/2023 17:40    Cardiac Studies   TTE  1. Left ventricular ejection fraction, by estimation, is 60 to 65%. Left  ventricular ejection fraction by 3D volume is 60 %. The left ventricle has  normal function. The left ventricle has no regional wall motion  abnormalities. There is mild left  ventricular hypertrophy. Left ventricular diastolic parameters are  indeterminate.   2. Right ventricular systolic function is normal. The right ventricular  size is normal. There is normal pulmonary artery systolic pressure. The  estimated right ventricular systolic pressure is 30.9 mmHg.   3. Left atrial size was mildly dilated.   4. The mitral valve is normal in structure. Trivial mitral valve  regurgitation. No evidence of  mitral stenosis.   5. The aortic valve is tricuspid. Aortic valve regurgitation is not  visualized. No aortic stenosis is present.   6. The inferior vena cava is normal in size with greater than 50%  respiratory variability, suggesting right atrial pressure of 3 mmHg.    Patient Profile     78 y.o. female history of hypertension and diabetes presented to the hospital with chest pain  Assessment & Plan    1.  Chest pain: Troponins negative.  Her pain is quite consistent with unstable angina.  Plan for left heart catheterization tomorrow.  Echo with a normal ejection fraction.  2.  Type 2 diabetes: Continue home medications and sliding scale insulin  3.  Hypertension: Blood pressure is significantly elevated in the hospital.  There is also elevated at home, consistently in the 160s.  [place] Hanlon stop losartan and start irbesartan.  Stop hydrochlorothiazide and start chlorthalidone.  Stop metoprolol and start carvedilol.  Sherrol Vicars likely need a fourth medication.  For questions or updates, please contact [facility] Please consult [email or web address] for contact info under        Signed, Liem Copenhaver Jorja Loa, MD  07/17/2023, 9:12 AM

## 2023-07-18 ENCOUNTER — Telehealth (HOSPITAL_COMMUNITY): Payer: Self-pay | Admitting: Pharmacy Technician

## 2023-07-18 ENCOUNTER — Encounter (HOSPITAL_COMMUNITY)
Admission: EM | Disposition: A | Discharge status: Home Health | Payer: Self-pay | Source: Home / Self Care | Attending: Cardiology

## 2023-07-18 ENCOUNTER — Other Ambulatory Visit (HOSPITAL_COMMUNITY): Payer: Self-pay

## 2023-07-18 ENCOUNTER — Encounter (HOSPITAL_COMMUNITY): Payer: Self-pay | Admitting: Cardiovascular Disease

## 2023-07-18 DIAGNOSIS — E785 Hyperlipidemia, unspecified: Secondary | ICD-10-CM

## 2023-07-18 DIAGNOSIS — E119 Type 2 diabetes mellitus without complications: Secondary | ICD-10-CM

## 2023-07-18 DIAGNOSIS — I200 Unstable angina: Secondary | ICD-10-CM | POA: Diagnosis not present

## 2023-07-18 DIAGNOSIS — I25110 Atherosclerotic heart disease of native coronary artery with unstable angina pectoris: Principal | ICD-10-CM

## 2023-07-18 HISTORY — PX: CORONARY STENT INTERVENTION: CATH118234

## 2023-07-18 HISTORY — PX: LEFT HEART CATH AND CORONARY ANGIOGRAPHY: CATH118249

## 2023-07-18 LAB — POCT ACTIVATED CLOTTING TIME
Activated Clotting Time: 256 s
Activated Clotting Time: 320 s

## 2023-07-18 LAB — LIPOPROTEIN A (LPA): Lipoprotein (a): 83.3 nmol/L — ABNORMAL HIGH (ref <75.0)

## 2023-07-18 LAB — CBC
HCT: 38.1 % (ref 36.0–46.0)
Hemoglobin: 12.6 g/dL (ref 12.0–15.0)
MCH: 28.8 pg (ref 26.0–34.0)
MCHC: 33.1 g/dL (ref 30.0–36.0)
MCV: 87.2 fL (ref 80.0–100.0)
Platelets: 238 10*3/uL (ref 150–400)
RBC: 4.37 MIL/uL (ref 3.87–5.11)
RDW: 14.9 % (ref 11.5–15.5)
WBC: 8.3 10*3/uL (ref 4.0–10.5)
nRBC: 0 % (ref 0.0–0.2)

## 2023-07-18 LAB — BASIC METABOLIC PANEL
Anion gap: 8 (ref 5–15)
BUN: 14 mg/dL (ref 8–23)
CO2: 24 mmol/L (ref 22–32)
Calcium: 8.5 mg/dL — ABNORMAL LOW (ref 8.9–10.3)
Chloride: 101 mmol/L (ref 98–111)
Creatinine, Ser: 0.61 mg/dL (ref 0.44–1.00)
GFR, Estimated: >60 mL/min (ref >60)
Glucose, Bld: 151 mg/dL — ABNORMAL HIGH (ref 70–99)
Potassium: 3.5 mmol/L (ref 3.5–5.1)
Sodium: 133 mmol/L — ABNORMAL LOW (ref 135–145)

## 2023-07-18 LAB — HEPARIN LEVEL (UNFRACTIONATED): Heparin Unfractionated: 0.49 [IU]/mL (ref 0.30–0.70)

## 2023-07-18 LAB — GLUCOSE, CAPILLARY
Glucose-Capillary: 207 mg/dL — ABNORMAL HIGH (ref 70–99)
Glucose-Capillary: 143 mg/dL — ABNORMAL HIGH (ref 70–99)
Glucose-Capillary: 196 mg/dL — ABNORMAL HIGH (ref 70–99)
Glucose-Capillary: 198 mg/dL — ABNORMAL HIGH (ref 70–99)

## 2023-07-18 SURGERY — LEFT HEART CATH AND CORONARY ANGIOGRAPHY
Anesthesia: LOCAL

## 2023-07-18 MED ORDER — LIDOCAINE HCL (PF) 1 % IJ SOLN
INTRAMUSCULAR | Status: AC
  Filled 2023-07-18: qty 30

## 2023-07-18 MED ORDER — HYDRALAZINE HCL 20 MG/ML IJ SOLN: 10.0000 mg | INTRAMUSCULAR | Status: AC | PRN

## 2023-07-18 MED ORDER — ASPIRIN 81 MG PO TBEC
81.0000 mg | DELAYED_RELEASE_TABLET | Freq: Every day | ORAL | Status: DC
  Administered 2023-07-18 – 2023-07-19 (×2): 81 mg via ORAL
  Filled 2023-07-18 (×2): qty 1

## 2023-07-18 MED ORDER — LIDOCAINE HCL (PF) 1 % IJ SOLN
INTRAMUSCULAR | Status: DC | PRN
  Administered 2023-07-18: 2 mL

## 2023-07-18 MED ORDER — HEPARIN SODIUM (PORCINE) 1000 UNIT/ML IJ SOLN
INTRAMUSCULAR | Status: AC
  Filled 2023-07-18: qty 10

## 2023-07-18 MED ORDER — SODIUM CHLORIDE 0.9 % IV SOLN: 250.0000 mL | INTRAVENOUS | Status: AC | PRN

## 2023-07-18 MED ORDER — LABETALOL HCL 5 MG/ML IV SOLN
INTRAVENOUS | Status: DC | PRN
  Administered 2023-07-18: 10 mg via INTRAVENOUS

## 2023-07-18 MED ORDER — MIDAZOLAM HCL 2 MG/2ML IJ SOLN
INTRAMUSCULAR | Status: AC
Start: 2023-07-18 — End: ?
  Filled 2023-07-18: qty 2

## 2023-07-18 MED ORDER — FENTANYL CITRATE (PF) 100 MCG/2ML IJ SOLN
INTRAMUSCULAR | Status: AC
  Filled 2023-07-18: qty 2

## 2023-07-18 MED ORDER — FENTANYL CITRATE (PF) 100 MCG/2ML IJ SOLN
INTRAMUSCULAR | Status: DC | PRN
  Administered 2023-07-18 (×2): 25 ug via INTRAVENOUS

## 2023-07-18 MED ORDER — HEPARIN (PORCINE) IN NACL 1000-0.9 UT/500ML-% IV SOLN
INTRAVENOUS | Status: DC | PRN
  Administered 2023-07-18 (×2): 500 mL

## 2023-07-18 MED ORDER — CLOPIDOGREL BISULFATE 75 MG PO TABS
75.0000 mg | ORAL_TABLET | Freq: Every day | ORAL | Status: DC
  Administered 2023-07-19: 75 mg via ORAL
  Filled 2023-07-18: qty 1

## 2023-07-18 MED ORDER — NITROGLYCERIN 1 MG/10 ML FOR IR/CATH LAB
INTRA_ARTERIAL | Status: DC | PRN
  Administered 2023-07-18 (×2): 200 ug via INTRACORONARY

## 2023-07-18 MED ORDER — CLOPIDOGREL BISULFATE 300 MG PO TABS
ORAL_TABLET | ORAL | Status: DC | PRN
  Administered 2023-07-18: 600 mg via ORAL

## 2023-07-18 MED ORDER — LABETALOL HCL 5 MG/ML IV SOLN
INTRAVENOUS | Status: AC
  Filled 2023-07-18: qty 4

## 2023-07-18 MED ORDER — ROSUVASTATIN CALCIUM 20 MG PO TABS
40.0000 mg | ORAL_TABLET | Freq: Every day | ORAL | Status: DC
  Administered 2023-07-18 – 2023-07-19 (×2): 40 mg via ORAL
  Filled 2023-07-18 (×2): qty 2

## 2023-07-18 MED ORDER — SODIUM CHLORIDE 0.9% FLUSH: 3.0000 mL | INTRAVENOUS | Status: DC | PRN

## 2023-07-18 MED ORDER — SODIUM CHLORIDE 0.9 % WEIGHT BASED INFUSION
1.0000 mL/kg/h | INTRAVENOUS | Status: DC
  Administered 2023-07-18: 1 mL/kg/h via INTRAVENOUS

## 2023-07-18 MED ORDER — SODIUM CHLORIDE 0.9% FLUSH
3.0000 mL | Freq: Two times a day (BID) | INTRAVENOUS | Status: DC
  Administered 2023-07-19: 3 mL via INTRAVENOUS

## 2023-07-18 MED ORDER — LABETALOL HCL 5 MG/ML IV SOLN: 10.0000 mg | INTRAVENOUS | Status: AC | PRN

## 2023-07-18 MED ORDER — NITROGLYCERIN 1 MG/10 ML FOR IR/CATH LAB
INTRA_ARTERIAL | Status: AC
Start: 2023-07-18 — End: 2023-07-18
  Filled 2023-07-18: qty 10

## 2023-07-18 MED ORDER — SODIUM CHLORIDE 0.9 % WEIGHT BASED INFUSION
3.0000 mL/kg/h | INTRAVENOUS | Status: DC
  Administered 2023-07-18: 3 mL/kg/h via INTRAVENOUS

## 2023-07-18 MED ORDER — MIDAZOLAM HCL 2 MG/2ML IJ SOLN
INTRAMUSCULAR | Status: AC
  Filled 2023-07-18: qty 2

## 2023-07-18 MED ORDER — CLOPIDOGREL BISULFATE 300 MG PO TABS
ORAL_TABLET | ORAL | Status: AC
  Filled 2023-07-18: qty 2

## 2023-07-18 MED ORDER — MIDAZOLAM HCL 2 MG/2ML IJ SOLN
INTRAMUSCULAR | Status: DC | PRN
  Administered 2023-07-18: 2 mg via INTRAVENOUS
  Administered 2023-07-18: 1 mg via INTRAVENOUS

## 2023-07-18 MED ORDER — IOHEXOL 350 MG/ML SOLN
INTRAVENOUS | Status: DC | PRN
  Administered 2023-07-18: 123 mL via INTRA_ARTERIAL

## 2023-07-18 MED ORDER — VERAPAMIL HCL 2.5 MG/ML IV SOLN
INTRAVENOUS | Status: AC
  Filled 2023-07-18: qty 2

## 2023-07-18 MED ORDER — HEPARIN SODIUM (PORCINE) 1000 UNIT/ML IJ SOLN
INTRAMUSCULAR | Status: DC | PRN
  Administered 2023-07-18: 6000 [IU] via INTRAVENOUS
  Administered 2023-07-18: 4000 [IU] via INTRAVENOUS
  Administered 2023-07-18: 5000 [IU] via INTRAVENOUS

## 2023-07-18 MED ORDER — AMLODIPINE BESYLATE 10 MG PO TABS
10.0000 mg | ORAL_TABLET | Freq: Every day | ORAL | Status: DC
  Administered 2023-07-18 – 2023-07-19 (×2): 10 mg via ORAL
  Filled 2023-07-18 (×2): qty 1

## 2023-07-18 MED ORDER — SODIUM CHLORIDE 0.9 % IV SOLN: INTRAVENOUS | Status: AC

## 2023-07-18 MED ORDER — IRBESARTAN 150 MG PO TABS
300.0000 mg | ORAL_TABLET | Freq: Every day | ORAL | Status: DC
  Administered 2023-07-18 – 2023-07-19 (×2): 300 mg via ORAL
  Filled 2023-07-18 (×2): qty 2

## 2023-07-18 MED ORDER — VERAPAMIL HCL 2.5 MG/ML IV SOLN
INTRAVENOUS | Status: DC | PRN
  Administered 2023-07-18: 10 mL via INTRA_ARTERIAL

## 2023-07-18 SURGICAL SUPPLY — 18 items
BALLN EMERGE MR 2.5X12 (BALLOONS) ×1 IMPLANT
BALLN ~~LOC~~ EUPHORA RX 3.0X12 (BALLOONS) ×1 IMPLANT
BALLOON EMERGE MR 2.5X12 (BALLOONS) IMPLANT
BALLOON ~~LOC~~ EUPHORA RX 3.0X12 (BALLOONS) IMPLANT
CATH 5FR JL3.5 JR4 ANG PIG MP (CATHETERS) IMPLANT
CATH LAUNCHER 6FR EBU3.5 (CATHETERS) IMPLANT
DEVICE RAD COMP TR BAND LRG (VASCULAR PRODUCTS) IMPLANT
GLIDESHEATH SLEND SS 6F .021 (SHEATH) IMPLANT
GUIDEWIRE INQWIRE 1.5J.035X260 (WIRE) IMPLANT
INQWIRE 1.5J .035X260CM (WIRE) ×1 IMPLANT
KIT ENCORE 26 ADVANTAGE (KITS) IMPLANT
MAT PREVALON FULL STRYKER (MISCELLANEOUS) IMPLANT
PACK CARDIAC CATHETERIZATION (CUSTOM PROCEDURE TRAY) ×1 IMPLANT
SET ATX-X65L (MISCELLANEOUS) IMPLANT
STENT SYNERGY XD 2.75X16 (Permanent Stent) IMPLANT
SYNERGY XD 2.75X16 (Permanent Stent) ×1 IMPLANT
TUBING CIL FLEX 10 FLL-RA (TUBING) IMPLANT
WIRE RUNTHROUGH .014X180CM (WIRE) IMPLANT

## 2023-07-18 NOTE — TOC Initial Note (Signed)
 Transition of Care [facility]) - Initial/Assessment Note    Patient Details  Name: Sandra Lawrence MRN: [id number] Date of Birth: [birth year]  [facility]) CM/SW Contact:    Gala Lewandowsky, RN Phone Number: 07/18/2023, 3:37 PM  Clinical Narrative: Patient presented for chest pain-plan LHC. PTA patient was from home. Patient has support of son and has DME rolling walker and bedside commode. Patient has been active with [facility] in the past. Case Manager will continue to follow for transition of care needs as the patient progresses.                    Expected Discharge Plan: Home/Self Care Barriers to Discharge: Continued Medical Work up   Patient Goals and CMS Choice Patient states their goals for this hospitalization and ongoing recovery are:: to return home once stable.  Expected Discharge Plan and Services In-house Referral: NA Discharge Planning Services: CM Consult   Living arrangements for the past 2 months: Apartment                   DME Agency: NA   Prior Living Arrangements/Services Living arrangements for the past 2 months: Apartment Lives with::  (has support of son) Patient language and need for interpreter reviewed:: Yes     Current home services: DME (bedside commode and rolling walker.) Criminal Activity/Legal Involvement Pertinent to Current Situation/Hospitalization: No - Comment as needed  Activities of Daily Living   ADL Screening (condition at time of admission) Independently performs ADLs?: Yes (appropriate for developmental age) Is the patient deaf or have difficulty hearing?: No Does the patient have difficulty seeing, even when wearing glasses/contacts?: No Does the patient have difficulty concentrating, remembering, or making decisions?: No  Permission Sought/Granted Permission sought to share information with : Family Supports, Case Manager     Alcohol / Substance Use: Not Applicable Psych Involvement: No  (comment)  Admission diagnosis:  Unstable angina (HCC) [I20.0] Chest pain, unspecified type [R07.9] Patient Active Problem List   Diagnosis Date Noted   Unstable angina (HCC) 07/15/2023   Hypertension associated with type 2 diabetes mellitus (HCC) 10/21/2021   Leukopenia 10/21/2021   Vitamin D deficiency 10/21/2021   Allergy 06/01/2021   Anxiety 06/01/2021   Asthma 06/01/2021   Blood transfusion without reported diagnosis 06/01/2021   CHF (congestive heart failure) (HCC) 06/01/2021   Chronic kidney disease 06/01/2021   Depression 06/01/2021   Diverticulosis 06/01/2021   Edema of both lower extremities 06/01/2021   Fatty tumor 06/01/2021   Full dentures 06/01/2021   GERD (gastroesophageal reflux disease) 06/01/2021   Heart murmur 06/01/2021   Lactose intolerance 06/01/2021   Osteoporosis 06/01/2021   Palpitations 06/01/2021   PVC (premature ventricular contraction) 06/01/2021   Tubular adenoma of colon 06/01/2021   Wears glasses 06/01/2021   Metformin adverse reaction 10/02/2020   Nonspecific abnormal electrocardiogram (ECG) (EKG) 03/12/2019   OAB (overactive bladder) 11/04/2016   DDD (degenerative disc disease), lumbar 02/04/2016   Tinnitus 02/04/2016   ACE-inhibitor cough 11/25/2015   Cataract associated with type 2 diabetes mellitus (HCC) 07/21/2015   S/P knee replacement 06/10/2014   Sleep apnea 09/05/2012   History of colonic polyps 09/05/2012   Hypertensive heart disease without CHF    Type 2 diabetes mellitus without complication, without long-term current use of insulin (HCC)    Mixed diabetic hyperlipidemia associated with type 2 diabetes mellitus (HCC)    DJD (degenerative joint disease) 03/17/2011   Morbid obesity with BMI of 40.0-44.9,  adult [facility]) 03/17/2011   CAD (coronary artery disease) 02/15/2007   PCP:  Ronnald Nian, MD Pharmacy:   [facility] [phone] - [place], [place] - [address] AT [address] [facility] [address] [place] [address] Phone: [phone] Fax: [phone]  [place] [facility] [address] [place] [address] Phone: [phone] Fax: [phone]  [facility] [address] [place] [address] Phone: [phone] Fax: [phone]   Social Drivers of Health (SDOH) Social History: SDOH Screenings   Food Insecurity: Food Insecurity Present (07/15/2023)  Housing: High Risk (07/15/2023)  Transportation Needs: No Transportation Needs (07/15/2023)  Utilities: Not At Risk (07/15/2023)  Alcohol Screen: Low Risk  (06/14/2023)  Depression (PHQ2-9): Low Risk  (06/14/2023)  Financial Resource Strain: Low Risk  (06/14/2023)  Physical Activity: Inactive (06/14/2023)  Social Connections: Moderately Integrated (07/15/2023)  Recent Concern: Social Connections - Moderately Isolated (06/14/2023)  Stress: No Stress Concern Present (06/14/2023)  Tobacco Use: Medium Risk (07/16/2023)  Health Literacy: Adequate Health Literacy (06/14/2023)   SDOH Interventions:     Readmission Risk Interventions     No data to display

## 2023-07-18 NOTE — Progress Notes (Signed)
 PHARMACY - ANTICOAGULATION CONSULT NOTE  Pharmacy Consult for heparin gtt Indication: chest pain/ACS  No Known Allergies  Patient Measurements: Height: 5\' 7"  (170.2 cm) Weight: 134.9 kg (297 lb 4.8 oz) IBW/kg (Calculated) : 61.6 Heparin Dosing Weight: 94.7 kg  Vital Signs: Temp: 97.5 F (36.4 C) (03/03 0531) Temp Source: Oral (03/03 0531) BP: 172/77 (03/03 0531) Pulse Rate: 59 (03/03 0531)  Labs: Recent Labs    07/15/23 1623 07/15/23 1623 07/15/23 2045 07/16/23 0418 07/16/23 1104 07/17/23 0353 07/18/23 0458  HGB 12.4  --   --  12.2  --  12.3 12.6  HCT 39.5  --   --  37.0  --  38.4 38.1  PLT 235  --   --  254  --  239 238  HEPARINUNFRC  --    < >  --  0.40 0.42 0.42 0.49  CREATININE 0.85  --   --  0.Sandra  --   --   --   TROPONINIHS 10  --  15  --   --   --   --    < > = values in this interval not displayed.    Estimated Creatinine Clearance: 87.2 mL/min (by C-G formula based on SCr of 0.Sandra mg/dL).   Medical History: Past Medical History:  Diagnosis Date   Anemia    Anxiety    Asthma    has inhalers   CAD (coronary artery disease) 02/15/2007   Cataract associated with type 2 diabetes mellitus (HCC) 07/21/2015   DDD (degenerative disc disease), lumbar 02/04/2016   Depression    Diabetes mellitus (HCC)    diet controlled- NO MEDS   Diverticulosis    DJD (degenerative joint disease) 03/17/2011   Essential hypertension 05/27/2021   GERD (gastroesophageal reflux disease)    History of colonic polyps 09/05/2012   Tubular adenoma   History of kidney stones    Hypercholesteremia    Morbid obesity with BMI of 40.0-44.9, adult (HCC) 03/17/2011   OAB (overactive bladder) 11/04/2016   Osteoporosis 06/01/2021   Sleep apnea    No cpap does not tolerate   Tubular adenoma of colon     Medications:  Medications Prior to Admission  Medication Sig Dispense Refill Last Dose/Taking   acetaminophen (TYLENOL) 500 MG tablet Take 2 tablets (1,000 mg total) by mouth 3  (three) times daily as needed for mild pain, moderate pain, fever or headache. 30 tablet 0 Taking As Needed   albuterol (VENTOLIN HFA) 108 (90 Base) MCG/ACT inhaler INHALE 1-2 PUFFS BY MOUTH EVERY 6 HOURS AS NEEDED FOR WHEEZE OR SHORTNESS OF BREATH 8.5 each 2 Taking   calcium carbonate (TUMS - DOSED IN MG ELEMENTAL CALCIUM) 500 MG chewable tablet Chew 3-4 tablets by mouth daily.   07/14/2023   Calcium Carbonate Antacid (ALKA-SELTZER ANTACID PO) Take by mouth as needed.   07/14/2023   Fluticasone Furoate (ARNUITY ELLIPTA) 100 MCG/ACT AEPB Inhale 2 puffs into the lungs daily. 90 each 5 Taking   Ibuprofen (ADVIL PO) Take 2 tablets by mouth daily as needed.   Past Week   losartan-hydrochlorothiazide (HYZAAR) 100-12.5 MG tablet Take 1 tablet by mouth daily. 90 tablet 3 07/14/2023   meloxicam (MOBIC) 15 MG tablet Take 15 mg by mouth daily.   Taking   metoprolol tartrate (LOPRESSOR) 50 MG tablet TAKE 1 TABLET BY MOUTH TWICE A DAY 180 tablet 0 07/14/2023   Multiple Vitamins-Minerals (CENTRUM SILVER PO) Take 1 capsule by mouth daily.   07/14/2023   naproxen (NAPROSYN) 500  MG tablet Take 1 tablet (500 mg total) by mouth 2 (two) times daily with a meal. (Patient taking differently: Take 500 mg by mouth daily as needed for mild pain (pain score 1-3), moderate pain (pain score 4-6) or headache.) 30 tablet 0 Taking Differently   nitroGLYCERIN (NITROSTAT) 0.4 MG SL tablet PLACE 1 TABLET (0.4 MG TOTAL) UNDER THE TONGUE EVERY 5 (FIVE) MINUTES AS NEEDED FOR CHEST PAIN. 100 tablet 0 07/15/2023   rosuvastatin (CRESTOR) 20 MG tablet TAKE 1 TABLET BY MOUTH EVERYDAY AT BEDTIME (Patient taking differently: Take 20 mg by mouth daily.) 90 tablet 0 07/14/2023   Semaglutide,0.25 or 0.5MG /DOS, (OZEMPIC, 0.25 OR 0.5 MG/DOSE,) 2 MG/3ML SOPN Inject 0.5 mg into the skin once a week. (Patient taking differently: Inject 0.5 mg into the skin once a week. Mondays) 3 mL 1 Taking Differently   solifenacin (VESICARE) 10 MG tablet TAKE 1 TABLET BY  MOUTH EVERY DAY 90 tablet 0 07/14/2023   Vitamin D, Ergocalciferol, (DRISDOL) 1.25 MG (50000 UNIT) CAPS capsule Take by mouth.   07/04/2023   Blood Glucose Monitoring Suppl DEVI 1 each by Does not apply route in the morning and at bedtime. May substitute to any manufacturer covered by patient's insurance. 1 each 0    glucose blood (ACCU-CHEK GUIDE) test strip Use as instructed 100 each 12    Glucose Blood (BLOOD GLUCOSE TEST STRIPS) STRP 1 each by In Vitro route in the morning and at bedtime. May substitute to any manufacturer covered by patient's insurance. 180 each 2     Assessment: Sandra Lawrence admitted w/ cc CP. Could be new exertional and now resting unstable angina. Patient has not been on [facility] therapy PTA based on a review of his medical record and pharmacy records.  Heparin level is therapeutic (0.49) on 1200 units/hr.  Planning for [facility] today.  Goal of Therapy:  Heparin level 0.3-0.7 units/ml Monitor platelets by anticoagulation protocol: Yes   Plan:  Continue heparin infusion at 1200 units/hr Will follow plans post cath  Harland German, PharmD Clinical Pharmacist **Pharmacist phone directory can now be found on [email or web address] ([email or web address]).  Listed under [facility].

## 2023-07-18 NOTE — Interval H&P Note (Signed)
 History and Physical Interval Note:  07/18/2023 8:51 AM  Sandra Lawrence  has presented today for surgery, with the diagnosis of unstable angina.  The various methods of treatment have been discussed with the patient and family. After consideration of risks, benefits and other options for treatment, the patient has consented to  Procedure(s): LEFT HEART CATH AND CORONARY ANGIOGRAPHY (N/A) as a surgical intervention.  The patient's history has been reviewed, patient examined, no change in status, stable for surgery.  I have reviewed the patient's chart and labs.  Questions were answered to the patient's satisfaction.     Tonny Bollman

## 2023-07-18 NOTE — Progress Notes (Addendum)
 Rounding Note    Patient Name: Sandra Lawrence Date of Encounter: 07/18/2023  [facility] Cardiologist: Garwin Brothers, MD   Subjective   No acute overnight events. No specific complaints this morning. No chest pain or shortness of breath. Plan is for [facility] today.  Inpatient Medications    Scheduled Meds:  aspirin EC  81 mg Oral Daily   carvedilol  12.5 mg Oral BID WC   chlorthalidone  25 mg Oral Daily   fesoterodine  4 mg Oral Daily   insulin aspart  0-20 Units Subcutaneous TID WC   insulin aspart  0-5 Units Subcutaneous QHS   irbesartan  150 mg Oral Daily   rosuvastatin  20 mg Oral Daily   Continuous Infusions:  sodium chloride 1 mL/kg/hr (07/18/23 0609)   heparin 1,200 Units/hr (07/15/23 2102)   PRN Meds: acetaminophen, nitroGLYCERIN, ondansetron (ZOFRAN) IV, mouth rinse   Vital Signs    Vitals:   07/17/23 0344 07/17/23 1137 07/17/23 2042 07/18/23 0531  BP: (!) 189/101 (!) 181/85  (!) 172/77  Pulse: 61 (!) 58 61 (!) 59  Resp: 16 16 18 17   Temp: 98.3 F (36.8 C) 98.3 F (36.8 C) 97.6 F (36.4 C) (!) 97.5 F (36.4 C)  TempSrc: Oral Oral Oral Oral  SpO2: 99%  99% 100%  Weight:      Height:       No intake or output data in the 24 hours ending 07/18/23 0801    07/15/2023    9:42 PM 07/15/2023    4:01 PM 06/30/2023    9:55 AM  Last 3 Weights  Weight (lbs) 297 lb 4.8 oz 300 lb 299 lb 6.4 oz  Weight (kg) 134.854 kg 136.079 kg 135.807 kg      Telemetry    Sinus rhythm with rates in the 50s to 60s. Occasional PVCs. - Personally Reviewed  ECG    No new ECG tracing today. - Personally Reviewed  Physical Exam   GEN: Morbidly obese African-American female resting comfortably. No acute distress.   Neck: No JVD. Cardiac: RRR. No murmurs, rubs, or gallops.  Respiratory: Clear to auscultation bilaterally. No wheezes, rhonchi, or rales.  MS: No lower extremity edema. No deformity. Skin: Warm and dry. Neuro:  No focal deficits.  Psych: Normal  affect. Responds appropriately.  Labs    High Sensitivity Troponin:   Recent Labs  Lab 07/15/23 1623 07/15/23 2045  TROPONINIHS 10 15     Chemistry Recent Labs  Lab 07/15/23 1623 07/16/23 0418  NA 136 135  K 4.0 3.8  CL 99 103  CO2 20* 23  GLUCOSE 368* 327*  BUN 17 22  CREATININE 0.85 0.75  CALCIUM 9.7 9.0  PROT 6.6  --   ALBUMIN 3.2*  --   AST 27  --   ALT 17  --   ALKPHOS 75  --   BILITOT 0.6  --   GFRNONAA >60 >60  ANIONGAP 17* 9    Lipids  Recent Labs  Lab 07/16/23 0418  CHOL 143  TRIG 51  HDL 53  LDLCALC 80  CHOLHDL 2.7    Hematology Recent Labs  Lab 07/16/23 0418 07/17/23 0353 07/18/23 0458  WBC 9.2 8.5 8.3  RBC 4.24 4.36 4.37  HGB 12.2 12.3 12.6  HCT 37.0 38.4 38.1  MCV 87.3 88.1 87.2  MCH 28.8 28.2 28.8  MCHC 33.0 32.0 33.1  RDW 15.3 15.1 14.9  PLT 254 239 238   Thyroid No results for  input(s): "TSH", "FREET4" in the last 168 hours.  BNP Recent Labs  Lab 07/15/23 1700  BNP 280.1*    DDimer No results for input(s): "DDIMER" in the last 168 hours.   Radiology    ECHOCARDIOGRAM COMPLETE Result Date: 07/16/2023    ECHOCARDIOGRAM REPORT   Patient Name:   TONETTE KOEHNE [facility] Date of Exam: 07/16/2023 Medical Rec #:  [id number]      Height:       67.0 in Accession #:    [id number]     Weight:       297.3 lb Date of Birth:  02/11/1946     BSA:          2.393 m Patient Age:    77 years       BP:           147/90 mmHg Patient Gender: F              HR:           67 bpm. Exam Location:  Inpatient Procedure: 2D Echo, 3D Echo, Color Doppler and Cardiac Doppler (Both Spectral            and Color Flow Doppler were utilized during procedure). Indications:    Chest pain  History:        Patient has prior history of Echocardiogram examinations, most                 recent 06/17/2021. CHF, CAD, Arrythmias:PVC,                 Signs/Symptoms:Palptitations; Risk Factors:Sleep Apnea,                 Hypertension, Dyslipidemia, Diabetes and Former Smoker.  Sonographer:     Raeford Razor RDCS Referring Phys: [id number] CHRISTOPHER RONALD BERGE IMPRESSIONS  1. Left ventricular ejection fraction, by estimation, is 60 to 65%. Left ventricular ejection fraction by 3D volume is 60 %. The left ventricle has normal function. The left ventricle has no regional wall motion abnormalities. There is mild left ventricular hypertrophy. Left ventricular diastolic parameters are indeterminate.  2. Right ventricular systolic function is normal. The right ventricular size is normal. There is normal pulmonary artery systolic pressure. The estimated right ventricular systolic pressure is 30.9 mmHg.  3. Left atrial size was mildly dilated.  4. The mitral valve is normal in structure. Trivial mitral valve regurgitation. No evidence of mitral stenosis.  5. The aortic valve is tricuspid. Aortic valve regurgitation is not visualized. No aortic stenosis is present.  6. The inferior vena cava is normal in size with greater than 50% respiratory variability, suggesting right atrial pressure of 3 mmHg. FINDINGS  Left Ventricle: Left ventricular ejection fraction, by estimation, is 60 to 65%. Left ventricular ejection fraction by 3D volume is 60 %. The left ventricle has normal function. The left ventricle has no regional wall motion abnormalities. Strain imaging was not performed. The left ventricular internal cavity size was normal in size. There is mild left ventricular hypertrophy. Left ventricular diastolic parameters are indeterminate. Right Ventricle: The right ventricular size is normal. No increase in right ventricular wall thickness. Right ventricular systolic function is normal. There is normal pulmonary artery systolic pressure. The tricuspid regurgitant velocity is 2.64 m/s, and  with an assumed right atrial pressure of 3 mmHg, the estimated right ventricular systolic pressure is 30.9 mmHg. Left Atrium: Left atrial size was mildly dilated. Right Atrium: Right atrial size was normal in size. Pericardium: There is  no evidence of pericardial effusion. Mitral Valve: The mitral valve is normal in structure. Trivial mitral valve regurgitation. No evidence of mitral valve stenosis. Tricuspid Valve: The tricuspid valve is normal in structure. Tricuspid valve regurgitation is trivial. Aortic Valve: The aortic valve is tricuspid. Aortic valve regurgitation is not visualized. No aortic stenosis is present. Aortic valve peak gradient measures 5.5 mmHg. Pulmonic Valve: The pulmonic valve was not well visualized. Pulmonic valve regurgitation is trivial. Aorta: The aortic root is normal in size and structure. Venous: The inferior vena cava is normal in size with greater than 50% respiratory variability, suggesting right atrial pressure of 3 mmHg. IAS/Shunts: The interatrial septum was not well visualized. Additional Comments: 3D was performed not requiring image post processing on an independent workstation and was normal.  LEFT VENTRICLE PLAX 2D LVIDd:         5.30 cm         Diastology LVIDs:         3.10 cm         LV e' medial:    6.53 cm/s LV PW:         1.10 cm         LV E/e' medial:  14.3 LV IVS:        0.90 cm         LV e' lateral:   8.38 cm/s LVOT diam:     1.90 cm         LV E/e' lateral: 11.1 LVOT Area:     2.84 cm                                 3D Volume EF                                LV 3D EF:    Left                                             ventricul                                             ar                                             ejection                                             fraction                                             by 3D  volume is                                             60 %.                                 3D Volume EF:                                3D EF:        60 %                                LV EDV:       145 ml                                LV ESV:       58 ml                                LV SV:        87 ml RIGHT VENTRICLE              IVC RV Basal diam:  3.30 cm     IVC diam: 2.00 cm RV Mid diam:    2.10 cm RV S prime:     11.50 cm/s TAPSE (M-mode): 2.4 cm LEFT ATRIUM              Index        RIGHT ATRIUM           Index LA diam:        4.90 cm  2.05 cm/m   RA Area:     16.60 cm LA Vol (A2C):   103.0 ml 43.04 ml/m  RA Volume:   43.60 ml  18.22 ml/m LA Vol (A4C):   90.5 ml  37.81 ml/m LA Biplane Vol: 97.7 ml  40.82 ml/m  AORTIC VALVE AV Area (Vmax): 2.67 cm AV Vmax:        117.00 cm/s AV Peak Grad:   5.5 mmHg LVOT Vmax:      110.00 cm/s  AORTA Ao Root diam: 3.00 cm Ao Asc diam:  3.70 cm MITRAL VALVE               TRICUSPID VALVE MV Area (PHT): 4.06 cm    TR Peak grad:   27.9 mmHg MV Decel Time: 187 msec    TR Vmax:        264.00 cm/s MV E velocity: 93.30 cm/s MV A velocity: 73.90 cm/s  SHUNTS MV E/A ratio:  1.26        Systemic Diam: 1.90 cm Epifanio Lesches MD Electronically signed by Epifanio Lesches MD Signature Date/Time: 07/16/2023/1:05:55 PM    Final     Cardiac Studies   Echocardiogram 07/16/2023: Impressions: 1. Left ventricular ejection fraction, by estimation, is 60 to 65%. Left  ventricular ejection fraction by 3D volume is 60 %. The left ventricle has  normal function. The left ventricle has no regional wall motion  abnormalities. There is mild left  ventricular hypertrophy. Left  ventricular diastolic parameters are  indeterminate.   2. Right ventricular systolic function is normal. The right ventricular  size is normal. There is normal pulmonary artery systolic pressure. The  estimated right ventricular systolic pressure is 30.9 mmHg.   3. Left atrial size was mildly dilated.   4. The mitral valve is normal in structure. Trivial mitral valve  regurgitation. No evidence of mitral stenosis.   5. The aortic valve is tricuspid. Aortic valve regurgitation is not  visualized. No aortic stenosis is present.   6. The inferior vena cava is normal in size with greater than 50%  respiratory  variability, suggesting right atrial pressure of 3 mmHg.   Patient Profile     78 y.o. female with a history of non-obstructive on CAD on cardiac catheterization in 2008, hypertension, dyslipidemia, type 2 diabetes mellitus, GERD, obstructive sleep apnea unable to tolerate CPAP, morbid obesity, and anxiety/ depression who was admitted on 07/15/2023 with chest pain concerning for unstable angina.  Assessment & Plan   Chest Pain Non-Obstructive CAD Patient has a history of non-obstructive CAD noted on remote cardiac catheterization in 2008. She now presents with chest pain concerning for unstable angina. High-sensitivity troponin negative x2. Echo showed LVEF of 60-65%.  - No chest pain.  - Continue IV Heparin. - Continue Aspirin 81mg  daily, Coreg 12.5mg  twice daily, and Crestor 20mg  daily. - Patient is scheduled for LHC today. The patient understands that risks include but are not limited to stroke (1 in 1000), death (1 in 1000), kidney failure [usually temporary] (1 in 500), bleeding (1 in 200), allergic reaction [possibly serious] (1 in 200), and agrees to proceed.    Hypertension BP has been markedly elevated this admission. She states systolic BP is usually in the 578I at home. - Continue Coreg 12.5mg  twice daily. - Continue Chlorthalidone 25mg  daily.  - Will increase Irbesartan to 300mg  daily. - Will start Amlodipine 10mg  daily.  - Will repeat BMET today.  Hyperlipidemia Lipid panel this admission. Total Cholesterol 143, Triglycerides 51, HDL 53, LDL 80. LDL goal <70 given CAD. - Will increase Crestor to 40mg  daily.  - Will need repeat lipid panel and LFTs in 6-8 weeks.  Type 2 Diabetes Mellitus Hemoglobin A1c 7.2%. - On Ozempic at home. - Continue sliding scale while admitted.   For questions or updates, please contact [facility] Please consult [email or web address] for contact info under        Signed, Corrin Parker, PA-C  07/18/2023, 8:01 AM     Patient seen  and examined. Agree with assessment and plan.  Patient just completed cardiac catheterization procedure performed by Dr. Excell Seltzer.  Patient was found to have significant stenosis in the circumflex vessel after the takeoff of marginal vessel as well as mild concomitant disease in LAD and marginal.  She underwent successful stenting of her circumflex vessel and tolerated the procedure well.  I went into the Cath Lab and reviewed the images with Dr. Excell Seltzer.  Patient states that she lives alone and prefers to stay overnight rather than be a same-day interventional discharge.  Presently she is chest pain-free with stable hemodynamics.  Aim for blood pressure less than 130/80.  With CAD and diabetes mellitus aim for LDL less than 55.  Check LP(a).  Increase exercise as tolerated and plan for weight reduction.  Plan discharge tomorrow.   Lennette Bihari, MD, [facility] 07/18/2023 9:35 AM

## 2023-07-18 NOTE — Telephone Encounter (Signed)
 Patient Product/process development scientist completed.    The patient is insured through [employer]. Patient has Medicare and is not eligible for a copay card, but may be able to apply for patient assistance or Medicare RX Payment Plan (Patient Must reach out to their plan, if eligible for payment plan), if available.    Ran test claim for Farxiga 10 mg and the current 30 day co-pay is $0.00.  Ran test claim for Jardiance 10 mg and the current 30 day co-pay is $0.00.  This test claim was processed through [facility]- copay amounts may vary at other pharmacies due to pharmacy/plan contracts, or as the patient moves through the different stages of their insurance plan.     Roland Earl, CPHT Pharmacy Technician III Certified Patient Advocate [facility] Patient Advocate Team Direct Number: [phone]  Fax: [phone]

## 2023-07-19 ENCOUNTER — Telehealth: Payer: Self-pay | Admitting: Physician Assistant

## 2023-07-19 ENCOUNTER — Other Ambulatory Visit (HOSPITAL_COMMUNITY): Payer: Self-pay

## 2023-07-19 DIAGNOSIS — Z955 Presence of coronary angioplasty implant and graft: Secondary | ICD-10-CM

## 2023-07-19 DIAGNOSIS — E785 Hyperlipidemia, unspecified: Secondary | ICD-10-CM

## 2023-07-19 DIAGNOSIS — I200 Unstable angina: Secondary | ICD-10-CM | POA: Diagnosis not present

## 2023-07-19 LAB — BASIC METABOLIC PANEL
Anion gap: 7 (ref 5–15)
BUN: 21 mg/dL (ref 8–23)
CO2: 23 mmol/L (ref 22–32)
Calcium: 8.6 mg/dL — ABNORMAL LOW (ref 8.9–10.3)
Chloride: 103 mmol/L (ref 98–111)
Creatinine, Ser: 0.77 mg/dL (ref 0.44–1.00)
GFR, Estimated: >60 mL/min (ref >60)
Glucose, Bld: 216 mg/dL — ABNORMAL HIGH (ref 70–99)
Potassium: 4 mmol/L (ref 3.5–5.1)
Sodium: 133 mmol/L — ABNORMAL LOW (ref 135–145)

## 2023-07-19 LAB — CBC
HCT: 39.7 % (ref 36.0–46.0)
Hemoglobin: 12.7 g/dL (ref 12.0–15.0)
MCH: 28 pg (ref 26.0–34.0)
MCHC: 32 g/dL (ref 30.0–36.0)
MCV: 87.4 fL (ref 80.0–100.0)
Platelets: 241 10*3/uL (ref 150–400)
RBC: 4.54 MIL/uL (ref 3.87–5.11)
RDW: 15.1 % (ref 11.5–15.5)
WBC: 7.8 10*3/uL (ref 4.0–10.5)
nRBC: 0 % (ref 0.0–0.2)

## 2023-07-19 LAB — GLUCOSE, CAPILLARY
Glucose-Capillary: 204 mg/dL — ABNORMAL HIGH (ref 70–99)
Glucose-Capillary: 182 mg/dL — ABNORMAL HIGH (ref 70–99)

## 2023-07-19 MED ORDER — ROSUVASTATIN CALCIUM 40 MG PO TABS
40.0000 mg | ORAL_TABLET | Freq: Every day | ORAL | 3 refills | Status: DC
  Filled 2023-07-19 – 2023-08-16 (×2): qty 30, 30d supply, fill #0
  Filled 2023-09-12: qty 30, 30d supply, fill #1
  Filled 2023-10-17: qty 30, 30d supply, fill #2
  Filled 2024-03-14: qty 30, 30d supply, fill #3
  Filled 2024-05-18: qty 30, 30d supply, fill #4

## 2023-07-19 MED ORDER — GLIPIZIDE 10 MG PO TABS
5.0000 mg | ORAL_TABLET | Freq: Two times a day (BID) | ORAL | 1 refills | Status: DC
  Filled 2023-07-19: qty 30, 30d supply, fill #0

## 2023-07-19 MED ORDER — SPIRONOLACTONE 12.5 MG HALF TABLET: 12.5000 mg | ORAL_TABLET | Freq: Every day | ORAL | Status: DC

## 2023-07-19 MED ORDER — ASPIRIN 81 MG PO TBEC
81.0000 mg | DELAYED_RELEASE_TABLET | Freq: Every day | ORAL | 12 refills | Status: AC
  Filled 2023-07-19 – 2023-08-16 (×3): qty 30, 30d supply, fill #0
  Filled 2023-09-12: qty 30, 30d supply, fill #1
  Filled 2023-10-17: qty 30, 30d supply, fill #2
  Filled 2023-11-16: qty 30, 30d supply, fill #3
  Filled 2023-12-16: qty 30, 30d supply, fill #4
  Filled 2024-01-18: qty 30, 30d supply, fill #5
  Filled 2024-02-10: qty 30, 30d supply, fill #6
  Filled 2024-03-14: qty 30, 30d supply, fill #7
  Filled 2024-05-18: qty 30, 30d supply, fill #8

## 2023-07-19 MED ORDER — CARVEDILOL 12.5 MG PO TABS
12.5000 mg | ORAL_TABLET | Freq: Once | ORAL | Status: AC
  Administered 2023-07-19: 12.5 mg via ORAL
  Filled 2023-07-19: qty 1

## 2023-07-19 MED ORDER — NITROGLYCERIN 0.4 MG SL SUBL
0.4000 mg | SUBLINGUAL_TABLET | SUBLINGUAL | 0 refills | Status: DC | PRN
  Filled 2023-07-19: qty 25, 7d supply, fill #0
  Filled 2024-06-07: qty 3, 1d supply, fill #0

## 2023-07-19 MED ORDER — CHLORTHALIDONE 25 MG PO TABS
25.0000 mg | ORAL_TABLET | Freq: Every day | ORAL | 2 refills | Status: AC
  Filled 2023-07-19 – 2023-08-16 (×2): qty 30, 30d supply, fill #0
  Filled 2023-09-12: qty 30, 30d supply, fill #1
  Filled 2023-10-17: qty 30, 30d supply, fill #2
  Filled 2023-11-16: qty 30, 30d supply, fill #3
  Filled 2023-12-16: qty 30, 30d supply, fill #4
  Filled 2024-05-18: qty 30, 30d supply, fill #5

## 2023-07-19 MED ORDER — SPIRONOLACTONE 25 MG PO TABS
12.5000 mg | ORAL_TABLET | Freq: Every day | ORAL | 1 refills | Status: DC
Start: 2023-07-20 — End: 2023-11-03
  Filled 2023-07-19 – 2023-08-16 (×2): qty 15, 30d supply, fill #0
  Filled 2023-09-12: qty 15, 30d supply, fill #1
  Filled 2023-10-17: qty 15, 30d supply, fill #2

## 2023-07-19 MED ORDER — IRBESARTAN 300 MG PO TABS
300.0000 mg | ORAL_TABLET | Freq: Every day | ORAL | 1 refills | Status: DC
Start: 2023-07-20 — End: 2024-01-09
  Filled 2023-07-19 – 2023-08-16 (×3): qty 30, 30d supply, fill #0
  Filled 2023-09-12: qty 30, 30d supply, fill #1
  Filled 2023-10-17: qty 30, 30d supply, fill #2
  Filled 2023-11-16: qty 30, 30d supply, fill #3
  Filled 2023-12-16: qty 30, 30d supply, fill #4

## 2023-07-19 MED ORDER — CARVEDILOL 25 MG PO TABS
25.0000 mg | ORAL_TABLET | Freq: Two times a day (BID) | ORAL | 2 refills | Status: DC
  Filled 2023-07-19: qty 60, 30d supply, fill #0

## 2023-07-19 MED ORDER — CLOPIDOGREL BISULFATE 75 MG PO TABS
75.0000 mg | ORAL_TABLET | Freq: Every day | ORAL | 3 refills | Status: DC
  Filled 2023-07-19 – 2023-08-16 (×2): qty 30, 30d supply, fill #0
  Filled 2023-09-12: qty 30, 30d supply, fill #1
  Filled 2023-10-17: qty 30, 30d supply, fill #2

## 2023-07-19 MED ORDER — CARVEDILOL 25 MG PO TABS: 25.0000 mg | ORAL_TABLET | Freq: Two times a day (BID) | ORAL | Status: DC

## 2023-07-19 NOTE — Care Management Important Message (Signed)
 Important Message  Patient Details  Name: Sandra Lawrence MRN: [id number] Date of Birth: 1945/11/06   Important Message Given:  Yes - Medicare IM     Renie Ora 07/19/2023, 1:23 PM

## 2023-07-19 NOTE — Care Management Important Message (Signed)
 Important Message  Patient Details  Name: Sandra Lawrence MRN: [id number] Date of Birth: 22-Mar-1946   Important Message Given:  Yes - Medicare IM     Renie Ora 07/19/2023, 12:56 PM

## 2023-07-19 NOTE — Telephone Encounter (Signed)
 Left voicemail to return call to office

## 2023-07-19 NOTE — Inpatient Diabetes Management (Signed)
 Inpatient Diabetes Program Recommendations  AACE/ADA: New Consensus Statement on Inpatient Glycemic Control (2015)  Target Ranges:  Prepandial:   less than 140 mg/dL      Peak postprandial:   less than 180 mg/dL (1-2 hours)      Critically ill patients:  140 - 180 mg/dL   Lab Results  Component Value Date   GLUCAP 204 (H) 07/19/2023   HGBA1C 7.2 (H) 04/20/2023    Review of Glycemic Control  Latest Reference Range & Units 07/18/23 08:13 07/18/23 12:23 07/18/23 16:41 07/18/23 21:58 07/19/23 07:41  Glucose-Capillary 70 - 99 mg/dL 409 (H) 811 (H) 914 (H) 198 (H) 204 (H)   Diabetes history: DM 2 Outpatient Diabetes medications: Ozempic 0.5 mg QMondays Current orders for Inpatient glycemic control:  Novolog 0-20 units tid + hs  A1c 7.2%  Inpatient Diabetes Program Recommendations:    -   either start one of the SGLT 2 medications while here or start pt on Novolog 4 units tid meal coverage if pt is eating >50% of meals  Thanks,  Christena Deem RN, MSN, BC-ADM Inpatient Diabetes Coordinator Team Pager [phone] (8a-5p)

## 2023-07-19 NOTE — Telephone Encounter (Deleted)
 error

## 2023-07-19 NOTE — TOC Progression Note (Signed)
 Transition of Care [facility]) - Progression Note    Patient Details  Name: Sandra Lawrence MRN: [id number] Date of Birth: Sep 16, 1945  Transition of [facility]) CM/SW Contact  Graves-Bigelow, Lamar Laundry, RN Phone Number: 07/19/2023, 12:30 PM  Clinical Narrative: Patient had questions regarding personal care aide. Case Manager spoke with patient regarding cost and at this time will not be an option due to cost. Case Manager did offer HH PT and Aide in the home-patient is agreeable to services. Patient has used [facility] in the past and wants to use them again. Referral submitted to [facility] and Start of care to begin within 24-48 hours post transition home. Patient states son will provide transportation home. No further needs identified.      Expected Discharge Plan: Home/Self Care Barriers to Discharge: No Barriers Identified  Expected Discharge Plan and Services In-house Referral: NA Discharge Planning Services: CM Consult   Living arrangements for the past 2 months: Apartment                   DME Agency: NA      HH Arranged: Nurse's Aide, PT HH Agency: [facility] Date HH Agency Contacted: 07/19/23 Time HH Agency Contacted: 1229 Representative spoke with at [facility] Agency: Clifton Custard   Social Determinants of Health (SDOH) Interventions SDOH Screenings   Food Insecurity: Food Insecurity Present (07/15/2023)  Housing: High Risk (07/15/2023)  Transportation Needs: No Transportation Needs (07/15/2023)  Utilities: Not At Risk (07/15/2023)  Alcohol Screen: Low Risk  (06/14/2023)  Depression (PHQ2-9): Low Risk  (06/14/2023)  Financial Resource Strain: Low Risk  (06/14/2023)  Physical Activity: Inactive (06/14/2023)  Social Connections: Moderately Integrated (07/15/2023)  Recent Concern: Social Connections - Moderately Isolated (06/14/2023)  Stress: No Stress Concern Present (06/14/2023)  Tobacco Use: Medium Risk (07/16/2023)  Health Literacy: Adequate Health Literacy  (06/14/2023)    Readmission Risk Interventions     No data to display

## 2023-07-19 NOTE — Progress Notes (Signed)
 CARDIAC REHAB PHASE I    Pt resting in bed, feeling well. Pt reports tolerating mobility in room with no CP, SOB or dizziness. Declined walk in hallway, states I am feeling to sleepy right now. Post stent education including site care, restrictions, risk factors, exercise guidelines, NTG use, antiplatelet therapy importance, heart healthy diabetic diet and CRP2 reviewed. All questions and concerns addressed. Will refer to [facility] for CRP2. Will continue to follow.   [id number] Woodroe Chen, RN BSN 07/19/2023 9:22 AM

## 2023-07-19 NOTE — Telephone Encounter (Addendum)
   Transition of Care Follow-up Phone Call Request    Patient Name: Sandra Lawrence Date of Birth: 11-17-1945 Date of Encounter: 07/19/2023  Primary Care Provider:  Sharlot Gowda Primary Cardiologist:  Maryjane Hurter has been scheduled for a transition of care follow up appointment with a [facility] provider:  Robin Searing 07/29/2023  Please reach out to within 48 hours of discharge to confirm appointment and review transition of care protocol questionnaire.  Anticipated discharge date: 07/19/2023  Signed,  Basilio Cairo, PA-C 07/19/2023, 2:26 PM

## 2023-07-19 NOTE — Progress Notes (Signed)
 Rounding Note    Patient Name: Sandra Lawrence Date of Encounter: 07/19/2023  [facility] Cardiologist: Garwin Brothers, MD   Subjective   Denies any symptoms this am or overnight.   Inpatient Medications    Scheduled Meds:  amLODipine  10 mg Oral Daily   aspirin EC  81 mg Oral Daily   carvedilol  12.5 mg Oral BID WC   chlorthalidone  25 mg Oral Daily   clopidogrel  75 mg Oral Q breakfast   fesoterodine  4 mg Oral Daily   insulin aspart  0-20 Units Subcutaneous TID WC   insulin aspart  0-5 Units Subcutaneous QHS   irbesartan  300 mg Oral Daily   rosuvastatin  40 mg Oral Daily   sodium chloride flush  3 mL Intravenous Q12H   Continuous Infusions:  sodium chloride     PRN Meds: sodium chloride, acetaminophen, nitroGLYCERIN, ondansetron (ZOFRAN) IV, mouth rinse, sodium chloride flush   Vital Signs    Vitals:   07/18/23 1116 07/18/23 1225 07/18/23 2012 07/19/23 0545  BP: (!) 161/68 (!) 170/72 (!) 164/66 (!) 148/75  Pulse:  (!) 59 68 68  Resp:  18 18 20   Temp:  97.6 F (36.4 C) 98.3 F (36.8 C) 97.7 F (36.5 C)  TempSrc:  Oral Oral Oral  SpO2:  97% 99% 99%  Weight:      Height:        Intake/Output Summary (Last 24 hours) at 07/19/2023 0739 Last data filed at 07/18/2023 1047 Gross per 24 hour  Intake 631.49 ml  Output 600 ml  Net 31.49 ml      07/15/2023    9:42 PM 07/15/2023    4:01 PM 06/30/2023    9:55 AM  Last 3 Weights  Weight (lbs) 297 lb 4.8 oz 300 lb 299 lb 6.4 oz  Weight (kg) 134.854 kg 136.079 kg 135.807 kg      Telemetry    NSR, consistently 6's with occasional PVC  - Personally Reviewed  ECG     07/19/2023: NSR, HR 61, with nonspecific ST changes - Personally Reviewed  Physical Exam   GEN: Sitting upright in bed with No acute distress.   Neck: No JVD Cardiac: RRR, no murmurs, rubs, or gallops.  Respiratory: Clear to auscultation bilaterally. GI: Soft, nontender, non-distended  MS: No edema; No deformity. Radial access  without hematoma or brusing.  Neuro:  Nonfocal  Psych: Normal affect   Labs    High Sensitivity Troponin:   Recent Labs  Lab 07/15/23 1623 07/15/23 2045  TROPONINIHS 10 15     Chemistry Recent Labs  Lab 07/15/23 1623 07/16/23 0418 07/18/23 1128  NA 136 135 133*  K 4.0 3.8 3.5  CL 99 103 101  CO2 20* 23 24  GLUCOSE 368* 327* 151*  BUN 17 22 14   CREATININE 0.85 0.75 0.61  CALCIUM 9.7 9.0 8.5*  PROT 6.6  --   --   ALBUMIN 3.2*  --   --   AST 27  --   --   ALT 17  --   --   ALKPHOS 75  --   --   BILITOT 0.6  --   --   GFRNONAA >60 >60 >60  ANIONGAP 17* 9 8    Lipids  Recent Labs  Lab 07/16/23 0418  CHOL 143  TRIG 51  HDL 53  LDLCALC 80  CHOLHDL 2.7    Hematology Recent Labs  Lab 07/17/23 0353 07/18/23 0458  07/19/23 0625  WBC 8.5 8.3 7.8  RBC 4.36 4.37 4.54  HGB 12.3 12.6 12.7  HCT 38.4 38.1 39.7  MCV 88.1 87.2 87.4  MCH 28.2 28.8 28.0  MCHC 32.0 33.1 32.0  RDW 15.1 14.9 15.1  PLT 239 238 241   Thyroid No results for input(s): "TSH", "FREET4" in the last 168 hours.  BNP Recent Labs  Lab 07/15/23 1700  BNP 280.1*    DDimer No results for input(s): "DDIMER" in the last 168 hours.   Radiology    CARDIAC CATHETERIZATION Result Date: 07/18/2023 1.  Severe mid circumflex stenosis, treated with a 2.75 x 16 mm Synergy DES, reducing 80% eccentric stenosis to 0% stenosis post PCI 2.  Mild nonobstructive plaquing in the proximal and mid LAD without high-grade stenosis 3.  Patent, dominant RCA with mild diffuse plaquing and no significant stenoses 4.  Normal LVEDP and normal LVEF by echo assessment Recommendations: DAPT with aspirin and clopidogrel x 12 months in the setting of unstable angina/ACS    Cardiac Studies  Echocardiogram 07/16/2023: Impressions: 1. Left ventricular ejection fraction, by estimation, is 60 to 65%. Left  ventricular ejection fraction by 3D volume is 60 %. The left ventricle has  normal function. The left ventricle has no regional  wall motion  abnormalities. There is mild left  ventricular hypertrophy. Left ventricular diastolic parameters are  indeterminate.   2. Right ventricular systolic function is normal. The right ventricular  size is normal. There is normal pulmonary artery systolic pressure. The  estimated right ventricular systolic pressure is 30.9 mmHg.   3. Left atrial size was mildly dilated.   4. The mitral valve is normal in structure. Trivial mitral valve  regurgitation. No evidence of mitral stenosis.   5. The aortic valve is tricuspid. Aortic valve regurgitation is not  visualized. No aortic stenosis is present.   6. The inferior vena cava is normal in size with greater than 50%  respiratory variability, suggesting right atrial pressure of 3 mmHg.   Diagnostic Dominance: Right Left Main  The left main is patent with no stenosis. Divides into the LAD and left circumflex.    Left Anterior Descending  The LAD is patent to the apex of the heart. The proximal vessel is large in caliber with mild irregularity. The mid vessel has 50% stenosis. The distal vessel has 30% stenosis. There are no high-grade lesions throughout the LAD distribution.  Mid LAD-1 lesion is 50% stenosed.  Mid LAD-2 lesion is 30% stenosed.    Left Circumflex  The circumflex is patent with a tight 80% eccentric stenosis in the mid vessel just after the origin of the second OM branch  Prox Cx to Mid Cx lesion is 80% stenosed. The lesion is eccentric.    Right Coronary Artery  There is mild diffuse disease throughout the vessel. Dominant vessel with mild diffuse plaquing. No significant stenoses noted.    Intervention   Prox Cx to Mid Cx lesion  Stent  Lesion crossed with guidewire. Pre-stent angioplasty was performed using a BALLN EMERGE MR 2.5X12. A drug-eluting stent was successfully placed using a SYNERGY XD 2.75X16. Post-stent angioplasty was performed using a BALLN [place] EUPHORA RX 3.0X12. Maximum pressure: 16 atm.   Post-Intervention Lesion Assessment  The intervention was successful. Pre-interventional TIMI flow is 3. Post-intervention TIMI flow is 3. No complications occurred at this lesion.  There is a 0% residual stenosis post intervention.     Diagnostic Dominance: Right  Intervention    Patient Profile  78 y.o. female with PMH of nonobstructive CAD in 2008, HDL, HTN, DM, OSA unable to tolerate CPAP, morbid obesity, anxiety/depression who was admitted on 07/15/23 with chest pain concerning for unstable angina.  Assessment & Plan    Chest Pain  Hx of nonobstructive CAD, 2008 Obstructive CAD, this admission - LHC 2008: Very mild LAD 30% and circumflex 50% -Presented to ED with intermittent chest pain associated with headache, n/v.  CP was relieved with NTG.  - ECHO 07/16/23: EF 60 to 65%.  No R WMA.  Mild LV hypertrophy.  LA mildly dilated.  Trivial MVR. -  LHC 07/18/23 with Dr. Excell Seltzer: LM no stenosis.  LAD mid vessel 50%, distal 30%.  LCx tight 80% eccentric from prox to mid treated with DES, resulting in 0% residual stenosis.  Recommended DAPT with aspirin 81 mg and clopidogrel 75 mg  x 12 months.  - EKG 07/19/2023: NSR, HR 61, with nonspecific ST changes - Currently on ASA 81 mg, clopidogrel 75 mg, Crestor 40 mg, carvedilol 12.5 mg BID.  - BMP pending this a.m. - This a.m. denies any chest pain, shortness of breath, or dizziness - Discussed importance of medical compliance.  - Denies any smoking, ETOH, or drug. - Admits to poor diet. Encourages heart healthy diet.  - Awaiting cardiac rehab.   Hypertension This admission BP has been markedly elevated.  Per patient home systolic BP is usually 160s.  This am was 1148/75 -->162/82 prior to meds this am. Will reevaluate BP.   - Currently on Coreg 12.5 mg BID, chlorthalidone 25 mg, irbesartan 300 mg, amlodipine 10 Mg - Will increase Coreg to 25 mg.  - BMP pending this am.  -  Can consider outpatient renal ultrasound.   Hyperlipidemia -  this admission LDL 80 with goal of < 55 given CAD - this admission Crestor increased to 40 mg daily - Repeat lipid panel and LFTs in 6 to 8 weeks  Type 2 diabetes - Hemoglobin A1c 7.2 - On Ozempic at home - Continue sliding scale while admitted  For questions or updates, please contact [facility] Please consult [email or web address] for contact info under        Signed, Basilio Cairo, PA-C  07/19/2023, 7:39 AM      Patient seen and examined. Agree with assessment and plan.  Patient feels well, day 1 status post successful PCI to the left circumflex vessel with 80% stenosis being reduced to 0%.  Patient has mild concomitant CAD which will be treated medically.  Will need optimal blood pressure control.  Aim for aggressive lipid management with target LDL less than 55.  Dose increased with continued blood pressure elevation. Plan discharge today with follow-up with Dr. Tomie China.   Lennette Bihari, MD, [facility] 07/19/2023 10:00 AM

## 2023-07-19 NOTE — Discharge Summary (Signed)
 Discharge Summary    Patient ID: Sandra Lawrence MRN: [id number]; DOB: May 03, 1946  Admit date: 07/15/2023 Discharge date: 07/19/2023  PCP:  Ronnald Nian, MD   [facility] Providers Cardiologist:  Garwin Brothers, MD      Discharge Diagnoses    Principal Problem:   Unstable angina [facility]) Active Problems:   Morbid obesity (HCC)   Type 2 diabetes mellitus without complication, with long-term current use of insulin (HCC)   Hypertension   Status post coronary artery stent placement   Hyperlipidemia with target low density lipoprotein (LDL) cholesterol less than 55 mg/dL  Diagnostic Studies/Procedures    Echocardiogram 07/16/2023: Impressions: 1. Left ventricular ejection fraction, by estimation, is 60 to 65%. Left  ventricular ejection fraction by 3D volume is 60 %. The left ventricle has  normal function. The left ventricle has no regional wall motion  abnormalities. There is mild left  ventricular hypertrophy. Left ventricular diastolic parameters are  indeterminate.   2. Right ventricular systolic function is normal. The right ventricular  size is normal. There is normal pulmonary artery systolic pressure. The  estimated right ventricular systolic pressure is 30.9 mmHg.   3. Left atrial size was mildly dilated.   4. The mitral valve is normal in structure. Trivial mitral valve  regurgitation. No evidence of mitral stenosis.   5. The aortic valve is tricuspid. Aortic valve regurgitation is not  visualized. No aortic stenosis is present.   6. The inferior vena cava is normal in size with greater than 50%  respiratory variability, suggesting right atrial pressure of 3 mmHg.    Diagnostic Dominance: Right Left Main  The left main is patent with no stenosis. Divides into the LAD and left circumflex.    Left Anterior Descending  The LAD is patent to the apex of the heart. The proximal vessel is large in caliber with mild irregularity. The mid vessel has 50%  stenosis. The distal vessel has 30% stenosis. There are no high-grade lesions throughout the LAD distribution.  Mid LAD-1 lesion is 50% stenosed.  Mid LAD-2 lesion is 30% stenosed.    Left Circumflex  The circumflex is patent with a tight 80% eccentric stenosis in the mid vessel just after the origin of the second OM branch  Prox Cx to Mid Cx lesion is 80% stenosed. The lesion is eccentric.    Right Coronary Artery  There is mild diffuse disease throughout the vessel. Dominant vessel with mild diffuse plaquing. No significant stenoses noted.    Intervention    Prox Cx to Mid Cx lesion  Stent  Lesion crossed with guidewire. Pre-stent angioplasty was performed using a BALLN EMERGE MR 2.5X12. A drug-eluting stent was successfully placed using a SYNERGY XD 2.75X16. Post-stent angioplasty was performed using a BALLN Ruthven EUPHORA RX 3.0X12. Maximum pressure: 16 atm.  Post-Intervention Lesion Assessment  The intervention was successful. Pre-interventional TIMI flow is 3. Post-intervention TIMI flow is 3. No complications occurred at this lesion.  There is a 0% residual stenosis post intervention.      Diagnostic Dominance: Right  Intervention    _____________   History of Present Illness     Sandra Lawrence is a 78 y.o. female with PMH of nonobstructive CAD with very mild LAD 30% stenosis and 50% circumflex stenosis in 2008, HFHDL, HTN, DM, OSA unable to tolerate CPAP, morbid obesity, anxiety/depression who was admitted on 07/15/23 with chest pain concerning for unstable angina.   Echo 2023 demonstrated well-preserved ejection fraction.  On 07/15/23, patient called EMS because she was having chest discomfort with headaches and N/V. She was treated with sublingual nitroglycerin, which improved her chest pain.  Reviewing the EMS run sheet she had some PVCs. BNP is mildly elevated. Troponin was negative.  Blood sugar is markedly elevated.  EKG demonstrates sinus rhythm with inferior ST  depression possible repolarization premature ventricular contractions.  This however is not different than previous EKG.   She reported intermittent chest pain for about 2 weeks. Associated with  activity such as doing dishes or walking with her walker but getting worse as the week went on.  It progressed to occurring at rest on 07/15/23.  Described as 6/10 left pressure without radiation. Positive nausea. She also reported chronic dyspnea.  She lives alone, drives and does her chores but has to use a cane and walker because of multiple ortho issues.  She did go on a cruise last week and road around in a scooter.  She sleeps on a couch and has sleep apnea but her CPAP smells so she does not use it.    Hospital Course     Chest Pain  Hx of nonobstructive CAD, 2008 Obstructive CAD, this admission - Previous LHC in 2008: Very mild LAD 30% and circumflex 50% -Presented to ED with intermittent chest pain x2 weeks associated with headache, n/v.  CP started occurring at rest. CP was relieved with NTG.  - ECHO 07/16/23: EF 60 to 65%.  No R WMA.  Mild LV hypertrophy.  LA mildly dilated.  Trivial MVR. -  LHC 07/18/23 with Dr. Excell Seltzer: LM no stenosis.  LAD mid vessel 50%, distal 30%.  LCx tight 80% eccentric from prox to mid treated with DES, resulting in 0% residual stenosis.  Recommended DAPT with aspirin 81 mg and clopidogrel 75 mg  x 12 months.  - EKG 07/19/2023: NSR, HR 61, with nonspecific ST changes - Currently on ASA 81 mg, clopidogrel 75 mg, Crestor 40 mg BID. Will continue.  - Increased carvedilol to 25 mg BID due to elevated BP.  - BMP WNL except Na 133 - This a.m. denies any chest pain, shortness of breath, or dizziness - Discussed importance of medical compliance.  - Denies any smoking, ETOH, or drug. - Admits to poor diet. Encourages heart healthy diet.  - Cardiac Rehab attempted to evaluated but patient declined activity due to being sleepy.    Hypertension This admission BP has been markedly  elevated.  Per patient home systolic BP is usually 160s.  This am was 1148/75 -->162/82 prior to meds this am. Will reevaluate BP.   - Currently on chlorthalidone 25 mg, irbesartan 300 mg  - Increased Coreg to 25 mg, D/C amlodipine 10 Mg, Started  Spironlactone 25 mg - BMP:   - Can consider outpatient renal ultrasound.  - Continue to monitor & log  BP at home -  Follow up with Dr. Tomie China for BP mgmt.    Hyperlipidemia - this admission LDL 80 with goal of < 55 given CAD - this admission Crestor increased to 40 mg daily - Repeat lipid panel and LFTs in 6 to 8 weeks   Type 2 diabetes - Hemoglobin A1c 7.2 - Tried metformin, SGLT2, ozempic and didn't tolerate any of these.  - Starting Glipizide and will continue at home.  - Follow up with PCP.     Did the patient have an acute coronary syndrome (MI, NSTEMI, STEMI, etc) this admission?:  Yes  AHA/ACC ACS Clinical Performance & Quality Measures: Aspirin prescribed? - Yes ADP Receptor Inhibitor (Plavix/Clopidogrel, Brilinta/Ticagrelor or Effient/Prasugrel) prescribed (includes medically managed patients)? - Yes Beta Blocker prescribed? - Yes High Intensity Statin (Lipitor 40-80mg  or Crestor 20-40mg ) prescribed? - Yes EF assessed during THIS hospitalization? - Yes For EF <40%, was ACEI/ARB prescribed? - Not Applicable (EF >/= 40%) For EF <40%, Aldosterone Antagonist (Spironolactone or Eplerenone) prescribed? - Not Applicable (EF >/= 40%) Cardiac Rehab Phase II ordered (including medically managed patients)? - Yes       The patient will be scheduled for a TOC follow up appointment in 10-14 days.  A message has been sent to the [facility] and Scheduling Pool at the office where the patient should be seen for follow up.  _____________  Discharge Vitals Blood pressure (!) 162/82, pulse 61, temperature 97.8 F (36.6 C), temperature source Oral, resp. rate 18, height 5\' 7"  (1.702 m), weight 134.9 kg, SpO2 98%.   Filed Weights   07/15/23 1601 07/15/23 2142  Weight: 136.1 kg 134.9 kg    Labs & Radiologic Studies    CBC Recent Labs    07/18/23 0458 07/19/23 0625  WBC 8.3 7.8  HGB 12.6 12.7  HCT 38.1 39.7  MCV 87.2 87.4  PLT 238 241   Basic Metabolic Panel Recent Labs    40/98/11 1128 07/19/23 0625  NA 133* 133*  K 3.5 4.0  CL 101 103  CO2 24 23  GLUCOSE 151* 216*  BUN 14 21  CREATININE 0.61 0.77  CALCIUM 8.5* 8.6*   Liver Function Tests No results for input(s): "AST", "ALT", "ALKPHOS", "BILITOT", "PROT", "ALBUMIN" in the last 72 hours. No results for input(s): "LIPASE", "AMYLASE" in the last 72 hours. High Sensitivity Troponin:   Recent Labs  Lab 07/15/23 1623 07/15/23 2045  TROPONINIHS 10 15    BNP Invalid input(s): "POCBNP" D-Dimer No results for input(s): "DDIMER" in the last 72 hours. Hemoglobin A1C No results for input(s): "HGBA1C" in the last 72 hours. Fasting Lipid Panel No results for input(s): "CHOL", "HDL", "LDLCALC", "TRIG", "CHOLHDL", "LDLDIRECT" in the last 72 hours. Thyroid Function Tests No results for input(s): "TSH", "T4TOTAL", "T3FREE", "THYROIDAB" in the last 72 hours.  Invalid input(s): "FREET3" _____________  CARDIAC CATHETERIZATION Result Date: 07/18/2023 1.  Severe mid circumflex stenosis, treated with a 2.75 x 16 mm Synergy DES, reducing 80% eccentric stenosis to 0% stenosis post PCI 2.  Mild nonobstructive plaquing in the proximal and mid LAD without high-grade stenosis 3.  Patent, dominant RCA with mild diffuse plaquing and no significant stenoses 4.  Normal LVEDP and normal LVEF by echo assessment Recommendations: DAPT with aspirin and clopidogrel x 12 months in the setting of unstable angina/ACS   ECHOCARDIOGRAM COMPLETE Result Date: 07/16/2023    ECHOCARDIOGRAM REPORT   Patient Name:   Sandra Lawrence [facility] Date of Exam: 07/16/2023 Medical Rec #:  [id number]      Height:       67.0 in Accession #:    [id number]     Weight:       297.3 lb Date of  Birth:  [birth year]     BSA:          2.393 m Patient Age:    77 years       BP:           147/90 mmHg Patient Gender: F              HR:  67 bpm. Exam Location:  Inpatient Procedure: 2D Echo, 3D Echo, Color Doppler and Cardiac Doppler (Both Spectral            and Color Flow Doppler were utilized during procedure). Indications:    Chest pain  History:        Patient has prior history of Echocardiogram examinations, most                 recent 06/17/2021. CHF, CAD, Arrythmias:PVC,                 Signs/Symptoms:Palptitations; Risk Factors:Sleep Apnea,                 Hypertension, Dyslipidemia, Diabetes and Former Smoker.  Sonographer:    Raeford Razor RDCS Referring Phys: [id number] CHRISTOPHER RONALD BERGE IMPRESSIONS  1. Left ventricular ejection fraction, by estimation, is 60 to 65%. Left ventricular ejection fraction by 3D volume is 60 %. The left ventricle has normal function. The left ventricle has no regional wall motion abnormalities. There is mild left ventricular hypertrophy. Left ventricular diastolic parameters are indeterminate.  2. Right ventricular systolic function is normal. The right ventricular size is normal. There is normal pulmonary artery systolic pressure. The estimated right ventricular systolic pressure is 30.9 mmHg.  3. Left atrial size was mildly dilated.  4. The mitral valve is normal in structure. Trivial mitral valve regurgitation. No evidence of mitral stenosis.  5. The aortic valve is tricuspid. Aortic valve regurgitation is not visualized. No aortic stenosis is present.  6. The inferior vena cava is normal in size with greater than 50% respiratory variability, suggesting right atrial pressure of 3 mmHg. FINDINGS  Left Ventricle: Left ventricular ejection fraction, by estimation, is 60 to 65%. Left ventricular ejection fraction by 3D volume is 60 %. The left ventricle has normal function. The left ventricle has no regional wall motion abnormalities. Strain imaging was not performed.  The left ventricular internal cavity size was normal in size. There is mild left ventricular hypertrophy. Left ventricular diastolic parameters are indeterminate. Right Ventricle: The right ventricular size is normal. No increase in right ventricular wall thickness. Right ventricular systolic function is normal. There is normal pulmonary artery systolic pressure. The tricuspid regurgitant velocity is 2.64 m/s, and  with an assumed right atrial pressure of 3 mmHg, the estimated right ventricular systolic pressure is 30.9 mmHg. Left Atrium: Left atrial size was mildly dilated. Right Atrium: Right atrial size was normal in size. Pericardium: There is no evidence of pericardial effusion. Mitral Valve: The mitral valve is normal in structure. Trivial mitral valve regurgitation. No evidence of mitral valve stenosis. Tricuspid Valve: The tricuspid valve is normal in structure. Tricuspid valve regurgitation is trivial. Aortic Valve: The aortic valve is tricuspid. Aortic valve regurgitation is not visualized. No aortic stenosis is present. Aortic valve peak gradient measures 5.5 mmHg. Pulmonic Valve: The pulmonic valve was not well visualized. Pulmonic valve regurgitation is trivial. Aorta: The aortic root is normal in size and structure. Venous: The inferior vena cava is normal in size with greater than 50% respiratory variability, suggesting right atrial pressure of 3 mmHg. IAS/Shunts: The interatrial septum was not well visualized. Additional Comments: 3D was performed not requiring image post processing on an independent workstation and was normal.  LEFT VENTRICLE PLAX 2D LVIDd:         5.30 cm         Diastology LVIDs:         3.10 cm  LV e' medial:    6.53 cm/s LV PW:         1.10 cm         LV E/e' medial:  14.3 LV IVS:        0.90 cm         LV e' lateral:   8.38 cm/s LVOT diam:     1.90 cm         LV E/e' lateral: 11.1 LVOT Area:     2.84 cm                                 3D Volume EF                                 LV 3D EF:    Left                                             ventricul                                             ar                                             ejection                                             fraction                                             by 3D                                             volume is                                             60 %.                                 3D Volume EF:                                3D EF:        60 %                                LV EDV:       145 ml  LV ESV:       58 ml                                LV SV:        87 ml RIGHT VENTRICLE             IVC RV Basal diam:  3.30 cm     IVC diam: 2.00 cm RV Mid diam:    2.10 cm RV S prime:     11.50 cm/s TAPSE (M-mode): 2.4 cm LEFT ATRIUM              Index        RIGHT ATRIUM           Index LA diam:        4.90 cm  2.05 cm/m   RA Area:     16.60 cm LA Vol (A2C):   103.0 ml 43.04 ml/m  RA Volume:   43.60 ml  18.22 ml/m LA Vol (A4C):   90.5 ml  37.81 ml/m LA Biplane Vol: 97.7 ml  40.82 ml/m  AORTIC VALVE AV Area (Vmax): 2.67 cm AV Vmax:        117.00 cm/s AV Peak Grad:   5.5 mmHg LVOT Vmax:      110.00 cm/s  AORTA Ao Root diam: 3.00 cm Ao Asc diam:  3.70 cm MITRAL VALVE               TRICUSPID VALVE MV Area (PHT): 4.06 cm    TR Peak grad:   27.9 mmHg MV Decel Time: 187 msec    TR Vmax:        264.00 cm/s MV E velocity: 93.30 cm/s MV A velocity: 73.90 cm/s  SHUNTS MV E/A ratio:  1.26        Systemic Diam: 1.90 cm Epifanio Lesches MD Electronically signed by Epifanio Lesches MD Signature Date/Time: 07/16/2023/1:05:55 PM    Final    DG Chest 2 View Result Date: 07/15/2023 CLINICAL DATA:  Chest pain for 2 days, gradually worsening. Headache, nausea, and vomiting. EXAM: CHEST - 2 VIEW COMPARISON:  04/25/2023 FINDINGS: Heart size and pulmonary vascularity are normal for technique. Lungs are clear. No pleural effusion or pneumothorax. Mediastinal contours  appear intact. Calcification of the aorta. Degenerative changes in the spine. IMPRESSION: No active cardiopulmonary disease. Electronically Signed   By: Burman Nieves M.D.   On: 07/15/2023 17:40   Disposition   Pt is being discharged home today in good condition.  Follow-up Plans & Appointments     Follow-up Information     [facility].   Specialty: Home Health Services Why: Physical Therapy and Home Health Aide-office to call with visit times. Contact information: [address] [place] [address] [phone]                Discharge Instructions     AMB Referral to [facility] Pharm[facility]   Complete by: As directed    Reason For Referral: Lipids   Amb Referral to Cardiac Rehabilitation   Complete by: As directed    Diagnosis: Coronary Stents   After initial evaluation and assessments completed: Virtual Based Care may be provided alone or in conjunction with Phase 2 Cardiac Rehab based on patient barriers.: Yes   Intensive Cardiac Rehabilitation (ICR) [facility] location only OR Traditional Cardiac Rehabilitation (TCR) *If criteria for ICR are not met will enroll  in TCR [facility] only): Yes   Diet - low sodium heart healthy   Complete by: As directed    Discharge instructions   Complete by: As directed    Radial Site Care Refer to this sheet in the next few weeks. These instructions provide you with information on caring for yourself after your procedure. Your caregiver may also give you more specific instructions. Your treatment has been planned according to current medical practices, but problems sometimes occur. Call your caregiver if you have any problems or questions after your procedure. HOME CARE INSTRUCTIONS You may shower the day after the procedure. Remove the bandage (dressing) and gently wash the site with plain soap and water. Gently pat the site dry.  Do not apply powder or lotion to the site.  Do not submerge the affected site in water  for 3 to 5 days.  Inspect the site at least twice daily.  Do not flex or bend the affected arm for 24 hours.  No lifting over 5 pounds (2.3 kg) for 5 days after your procedure.  Do not drive home if you are discharged the same day of the procedure. Have someone else drive you.  You may drive 24 hours after the procedure unless otherwise instructed by your caregiver.  What to expect: Any bruising will usually fade within 1 to 2 weeks.  Blood that collects in the tissue (hematoma) may be painful to the touch. It should usually decrease in size and tenderness within 1 to 2 weeks.  SEEK IMMEDIATE MEDICAL CARE IF: You have unusual pain at the radial site.  You have redness, warmth, swelling, or pain at the radial site.  You have drainage (other than a small amount of blood on the dressing).  You have chills.  You have a fever or persistent symptoms for more than 72 hours.  You have a fever and your symptoms suddenly get worse.  Your arm becomes pale, cool, tingly, or numb.  You have heavy bleeding from the site. Hold pressure on the site.   Face-to-face encounter (required for Medicare/Medicaid patients)   Complete by: As directed    I Basilio Cairo certify that this patient is under my care and that I, or a nurse practitioner or physician's assistant working with me, had a face-to-face encounter that meets the physician face-to-face encounter requirements with this patient on 07/19/2023. The encounter with the patient was in whole, or in part for the following medical condition(s) which is the primary reason for home health care (List medical condition): unstable ACS, deconditioned   The encounter with the patient was in whole, or in part, for the following medical condition, which is the primary reason for home health care: ACS   I certify that, based on my findings, the following services are medically necessary home health services:  Physical therapy Nursing     Reason for Medically  Necessary Home Health Services: Therapy- Therapeutic Exercises to Increase Strength and Endurance   My clinical findings support the need for the above services: Pain interferes with ambulation/mobility   Further, I certify that my clinical findings support that this patient is homebound due to: Pain interferes with ambulation/mobility   Home Health   Complete by: As directed    To provide the following care/treatments:  Home Health Aide PT     Increase activity slowly   Complete by: As directed         Discharge Medications   Allergies as of 07/19/2023  Reactions   Amlodipine    Gum issues   Farxiga [dapagliflozin]    Yeast infection   Metformin And Related    GI upset        Medication List     STOP taking these medications    ADVIL PO   losartan-hydrochlorothiazide 100-12.5 MG tablet Commonly known as: HYZAAR   meloxicam 15 MG tablet Commonly known as: MOBIC   metoprolol tartrate 50 MG tablet Commonly known as: LOPRESSOR   naproxen 500 MG tablet Commonly known as: Naprosyn   Ozempic (0.25 or 0.5 MG/DOSE) 2 MG/3ML Sopn Generic drug: Semaglutide(0.25 or 0.5MG /DOS)       TAKE these medications    Accu-Chek Guide test strip Generic drug: glucose blood Use as instructed   BLOOD GLUCOSE TEST STRIPS Strp 1 each by In Vitro route in the morning and at bedtime. May substitute to any manufacturer covered by patient's insurance.   acetaminophen 500 MG tablet Commonly known as: TYLENOL Take 2 tablets (1,000 mg total) by mouth 3 (three) times daily as needed for mild pain, moderate pain, fever or headache.   albuterol 108 (90 Base) MCG/ACT inhaler Commonly known as: VENTOLIN HFA INHALE 1-2 PUFFS BY MOUTH EVERY 6 HOURS AS NEEDED FOR WHEEZE OR SHORTNESS OF BREATH   Arnuity Ellipta 100 MCG/ACT Aepb Generic drug: Fluticasone Furoate Inhale 2 puffs into the lungs daily.   aspirin EC 81 MG tablet Take 1 tablet (81 mg total) by mouth daily. Swallow  whole. Start taking on: July 20, 2023   Blood Glucose Monitoring Suppl Devi 1 each by Does not apply route in the morning and at bedtime. May substitute to any manufacturer covered by patient's insurance.   calcium carbonate 500 MG chewable tablet Commonly known as: TUMS - dosed in mg elemental calcium Chew 3-4 tablets by mouth daily.   ALKA-SELTZER ANTACID PO Take by mouth as needed.   carvedilol 25 MG tablet Commonly known as: COREG Take 1 tablet (25 mg total) by mouth 2 (two) times daily with a meal.   CENTRUM SILVER PO Take 1 capsule by mouth daily.   chlorthalidone 25 MG tablet Commonly known as: HYGROTON Take 1 tablet (25 mg total) by mouth daily. Start taking on: July 20, 2023   clopidogrel 75 MG tablet Commonly known as: PLAVIX Take 1 tablet (75 mg total) by mouth daily with breakfast. Start taking on: July 20, 2023   glipiZIDE 10 MG tablet Commonly known as: GLUCOTROL Take 0.5 tablets (5 mg total) by mouth 2 (two) times daily.   irbesartan 300 MG tablet Commonly known as: AVAPRO Take 1 tablet (300 mg total) by mouth daily. Start taking on: July 20, 2023   nitroGLYCERIN 0.4 MG SL tablet Commonly known as: NITROSTAT Place 1 tablet (0.4 mg total) under the tongue every 5 (five) minutes as needed for chest pain.   rosuvastatin 40 MG tablet Commonly known as: CRESTOR Take 1 tablet (40 mg total) by mouth daily. Start taking on: July 20, 2023 What changed:  medication strength See the new instructions.   solifenacin 10 MG tablet Commonly known as: VESICARE TAKE 1 TABLET BY MOUTH EVERY DAY   spironolactone 25 MG tablet Commonly known as: ALDACTONE Take 0.5 tablets (12.5 mg total) by mouth daily. Start taking on: July 20, 2023   Vitamin D (Ergocalciferol) 1.25 MG (50000 UNIT) Caps capsule Commonly known as: DRISDOL Take by mouth.           Outstanding Labs/Studies   LFT, Lipids, BMET Duration  of Discharge Encounter: APP Time: 35 minutes    Signed, Basilio Cairo, PA-C 07/19/2023, 2:36 PM

## 2023-07-20 NOTE — Telephone Encounter (Signed)
 Patient contacted regarding discharge from [facility] on 07/19/2023.  Patient understands to follow up with provider - Cristi Loron on 07/29/2023. Patient understands discharge instructions?yes Patient understands medications and regiment? yes Patient understands to bring all medications to this visit? yes  Ask patient:  Are you enrolled in My Char -  yes

## 2023-07-25 ENCOUNTER — Telehealth: Payer: Self-pay | Admitting: *Deleted

## 2023-07-25 ENCOUNTER — Telehealth: Payer: Self-pay

## 2023-07-25 DIAGNOSIS — I509 Heart failure, unspecified: Secondary | ICD-10-CM | POA: Diagnosis not present

## 2023-07-25 DIAGNOSIS — I152 Hypertension secondary to endocrine disorders: Secondary | ICD-10-CM | POA: Diagnosis not present

## 2023-07-25 DIAGNOSIS — I493 Ventricular premature depolarization: Secondary | ICD-10-CM | POA: Diagnosis not present

## 2023-07-25 DIAGNOSIS — E1136 Type 2 diabetes mellitus with diabetic cataract: Secondary | ICD-10-CM | POA: Diagnosis not present

## 2023-07-25 DIAGNOSIS — F32A Depression, unspecified: Secondary | ICD-10-CM | POA: Diagnosis not present

## 2023-07-25 DIAGNOSIS — E1122 Type 2 diabetes mellitus with diabetic chronic kidney disease: Secondary | ICD-10-CM | POA: Diagnosis not present

## 2023-07-25 DIAGNOSIS — N189 Chronic kidney disease, unspecified: Secondary | ICD-10-CM | POA: Diagnosis not present

## 2023-07-25 DIAGNOSIS — E1159 Type 2 diabetes mellitus with other circulatory complications: Secondary | ICD-10-CM | POA: Diagnosis not present

## 2023-07-25 DIAGNOSIS — I25110 Atherosclerotic heart disease of native coronary artery with unstable angina pectoris: Secondary | ICD-10-CM | POA: Diagnosis not present

## 2023-07-25 NOTE — Progress Notes (Signed)
 Complex Care Management Note  Care Guide Note 07/25/2023 Name: ANDA SOBOTTA MRN: [id number] DOB: Sep 13, 1945  DANYELLE BROOKOVER is a 78 y.o. year old female who sees Susann Givens, Everardo All, MD for primary care. I reached out to [name] by phone today to offer complex care management services.  Ms. Raybon was given information about Complex Care Management services today including:   The Complex Care Management services include support from the care team which includes your Nurse Care Manager, Clinical Social Worker, or Pharmacist.  The Complex Care Management team is here to help remove barriers to the health concerns and goals most important to you. Complex Care Management services are voluntary, and the patient may decline or stop services at any time by request to their care team member.   Complex Care Management Consent Status: Patient did not agree to participate in complex care management services at this time.    Encounter Outcome:  Patient Refused  Gwenevere Ghazi  [facility]  [facility] Guide  Direct Dial: [phone]  Fax [phone]

## 2023-07-27 ENCOUNTER — Ambulatory Visit: Admitting: Family Medicine

## 2023-07-27 VITALS — BP 124/82 | HR 65 | Wt 287.6 lb

## 2023-07-27 DIAGNOSIS — E1159 Type 2 diabetes mellitus with other circulatory complications: Secondary | ICD-10-CM | POA: Diagnosis not present

## 2023-07-27 DIAGNOSIS — Z955 Presence of coronary angioplasty implant and graft: Secondary | ICD-10-CM

## 2023-07-27 DIAGNOSIS — I200 Unstable angina: Secondary | ICD-10-CM

## 2023-07-27 DIAGNOSIS — E1169 Type 2 diabetes mellitus with other specified complication: Secondary | ICD-10-CM | POA: Diagnosis not present

## 2023-07-27 DIAGNOSIS — Z789 Other specified health status: Secondary | ICD-10-CM | POA: Insufficient documentation

## 2023-07-27 DIAGNOSIS — E785 Hyperlipidemia, unspecified: Secondary | ICD-10-CM | POA: Diagnosis not present

## 2023-07-27 DIAGNOSIS — E119 Type 2 diabetes mellitus without complications: Secondary | ICD-10-CM

## 2023-07-27 DIAGNOSIS — I152 Hypertension secondary to endocrine disorders: Secondary | ICD-10-CM

## 2023-07-27 DIAGNOSIS — N3281 Overactive bladder: Secondary | ICD-10-CM

## 2023-07-27 DIAGNOSIS — Z794 Long term (current) use of insulin: Secondary | ICD-10-CM

## 2023-07-27 DIAGNOSIS — I119 Hypertensive heart disease without heart failure: Secondary | ICD-10-CM

## 2023-07-27 DIAGNOSIS — R5381 Other malaise: Secondary | ICD-10-CM

## 2023-07-27 MED ORDER — SOLIFENACIN SUCCINATE 10 MG PO TABS
10.0000 mg | ORAL_TABLET | Freq: Every day | ORAL | 0 refills | Status: DC
Start: 2023-07-27 — End: 2023-11-16

## 2023-07-27 MED ORDER — TIRZEPATIDE 2.5 MG/0.5ML ~~LOC~~ SOAJ: 2.5000 mg | SUBCUTANEOUS | 0 refills | Status: DC

## 2023-07-27 NOTE — Patient Instructions (Addendum)
 Keep me informed of difficulty with any of your medications especially the new1 called Mounjaro.  Leave a message on MyChart stop taking the glipizide

## 2023-07-27 NOTE — Progress Notes (Signed)
   Subjective:    Patient ID: Sandra Lawrence, female    DOB: 01-31-1946, 78 y.o.   MRN: [id number]  HPI She is here for a follow-up visit after recent hospitalization on February 28 for evaluation of unstable angina.  Evaluation led to a catheterization and stenting.  She tolerated this procedure well.  She stated that she could tell a difference in how she was feeling.  Also of note is the fact that she stopped taking Ozempic due to indigestion but did not call me.  She had previous difficulty with Farxiga causing yeast infections.  Presently she is on aspirin as well as Plavix.  They also increased her Coreg to 25 twice daily.  She is also being seen at home by [facility] which is helping her with her ADLs especially with physical therapy and with bathing.  She had difficulty in the hospital with some abdominal discomfort due to her large pannus and skin irritation.  Washing with soap and water has helped clear this up.  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   Review of Systems     Objective:    Physical Exam Alert and in no distress.  Cardiac exam shows regular rhythm without murmurs or gallops.  Exam of her abdomen does show slight erythema on the mid abdominal area underneath her layers of adiposity.       Assessment & Plan:  Unstable angina (HCC)  Type 2 diabetes mellitus with other specified complication, without long-term current use of insulin (HCC)  Type 2 diabetes mellitus without complication, with long-term current use of insulin (HCC) - Plan: tirzepatide (MOUNJARO) 2.5 MG/0.5ML Pen  Status post coronary artery stent placement  Hypertension associated with type 2 diabetes mellitus (HCC)  Hypertensive heart disease without CHF  Hyperlipidemia with target low density lipoprotein (LDL) cholesterol less than 55 mg/dL  OAB (overactive bladder) - Plan: solifenacin  (VESICARE) 10 MG tablet  I will place her back on Vesicare.  She will continue to be followed by cardiology.  I will try her on Mounjaro since she is not tolerant of Ozempic.  Stop glipizide.  She will keep me informed as to whether she can tolerate the [facility].  She will continue to be followed by [facility].  She is to follow-up with the rehab unit which did help her with her physical activity.  She is definitely deconditioned as she is using a walker to help get around.  She will keep me informed concerning the [facility] as they only gave her 1 month supply.

## 2023-07-28 ENCOUNTER — Telehealth (HOSPITAL_COMMUNITY): Payer: Self-pay

## 2023-07-28 NOTE — Telephone Encounter (Signed)
 Called patient to see if she is interested in the Cardiac Rehab Program. Patient expressed interest. Explained scheduling process and went over insurance, patient verbalized understanding. Will contact patient for scheduling once f/u has been completed.

## 2023-07-28 NOTE — Progress Notes (Unsigned)
 Cardiology Office Note    Patient Name: Sandra Lawrence Date of Encounter: 07/28/2023  Primary Care Provider:  Ronnald Nian, MD Primary Cardiologist:  Garwin Brothers, MD Primary Electrophysiologist: None   Past Medical History    Past Medical History:  Diagnosis Date   Anemia    Anxiety    Asthma    has inhalers   CAD (coronary artery disease) 02/15/2007   Cataract associated with type 2 diabetes mellitus (HCC) 07/21/2015   DDD (degenerative disc disease), lumbar 02/04/2016   Depression    Diabetes mellitus (HCC)    diet controlled- NO MEDS   Diverticulosis    DJD (degenerative joint disease) 03/17/2011   Essential hypertension 05/27/2021   GERD (gastroesophageal reflux disease)    History of colonic polyps 09/05/2012   Tubular adenoma   History of kidney stones    Hypercholesteremia    Morbid obesity with BMI of 40.0-44.9, adult (HCC) 03/17/2011   OAB (overactive bladder) 11/04/2016   Osteoporosis 06/01/2021   Sleep apnea    No cpap does not tolerate   Tubular adenoma of colon     History of Present Illness  Sandra Lawrence is a 78 y.o. female with a PMH of CAD s/p LHC with PCI/DES to mid circumflex stenosis mild nonobstructive plaquing in proximal and mid LAD, HTN, HLD DM type II, OSA (intolerant to CPAP) who presents today for post PCI follow-up.  Sandra Lawrence was seen initially in 2012 with complaint of palpitations and chest pain.  She had a previous LHC in 2009 by Dr. Katrinka Blazing that showed 30% mid LAD and 50% ostial lesion.  She was hospitalized in 07/2013 of palpitations a nuclear stress test that showed no evidence of ischemia.  She underwent a right total knee replacement and developed ST segment changes while in PACU but denied chest pain.  She had troponins cycled that were normal and no evidence of ACS was indicated.  She presented to the ED on 07/15/2023 with complaint of chest pain along with nausea and headache with vomiting.  She was transported to the ED via EMS  and BNP was found to be elevated with negative troponins.  EKG showed sinus rhythm with inferior ST depression that was different from previous readings.  She was admitted for further evaluation and started on heparin drip.  2D echo was completed showing EF of 60 to 65% with no RWMA and mild LVH.  She underwent LHC that revealed 80% eccentric stenosis in the circumflex that was treated with PCI/DES x 1, 30% stenosis in the distal mid LAD and 50% stenosis in mid LAD.  She was started on DAPT with Plavix and ASA x 12 months.  She was noted to have elevated BP and was continued on chlorthalidone, irbesartan with notably discontinued and spironolactone added in addition to carvedilol increased to 25 mg.  Sandra Lawrence presents today for post PCI follow-up.  She reports adherence to the prescribed medications, including aspirin, Plavix, chlorthalidone, irbesartan, carvedilol, and rosuvastatin.    Her blood pressure today is stable at 138/80. The patient has been experiencing dizziness and a feeling of fullness in the right ear since the hospitalization. The dizziness is particularly pronounced when transitioning from sitting to standing and has led to a feeling of instability. The patient also reports a change in dietary habits, with a focus on reducing sodium and sugar intake and increasing lean meat consumption. The patient has concerns about the affordability of cardiac rehabilitation and is currently considering options.  Patient denies chest pain, palpitations, dyspnea, PND, orthopnea, nausea, vomiting, dizziness, syncope, edema, weight gain, or early satiety.   Review of Systems  Please see the history of present illness.    All other systems reviewed and are otherwise negative except as noted above.  Physical Exam    Wt Readings from Last 3 Encounters:  07/27/23 287 lb 9.6 oz (130.5 kg)  07/15/23 297 lb 4.8 oz (134.9 kg)  06/30/23 299 lb 6.4 oz (135.8 kg)   WG:NFAOZ were no vitals filed for this  visit.,There is no height or weight on file to calculate BMI. GEN: Well nourished, well developed in no acute distress Neck: No JVD; No carotid bruits Pulmonary: Clear to auscultation without rales, wheezing or rhonchi  Cardiovascular: Normal rate. Regular rhythm. Normal S1. Normal S2.   Murmurs: There is no murmur.  ABDOMEN: Soft, non-tender, non-distended EXTREMITIES:  No edema; No deformity   EKG/LABS/ Recent Cardiac Studies   ECG personally reviewed by me today -none completed today  Risk Assessment/Calculations:          Lab Results  Component Value Date   WBC 7.8 07/19/2023   HGB 12.7 07/19/2023   HCT 39.7 07/19/2023   MCV 87.4 07/19/2023   PLT 241 07/19/2023   Lab Results  Component Value Date   CREATININE 0.77 07/19/2023   BUN 21 07/19/2023   NA 133 (L) 07/19/2023   K 4.0 07/19/2023   CL 103 07/19/2023   CO2 23 07/19/2023   Lab Results  Component Value Date   CHOL 143 07/16/2023   HDL 53 07/16/2023   LDLCALC 80 07/16/2023   TRIG 51 07/16/2023   CHOLHDL 2.7 07/16/2023    Lab Results  Component Value Date   HGBA1C 7.2 (H) 04/20/2023   Assessment & Plan    1.  CAD: -s/p LHC completed 07/2023 showing 80% circumflex lesion treated with PCI/DES x 1 with 50% mid LAD lesion and 30% distal LAD lesion treated medically -Patient's right radial site is clean dry and intact with no evidence of ecchymosis or hematoma -She is currently on ASA 81 mg and Plavix 75 mg -Today patient reports no chest pain or angina since previous follow-up. -Continue Coreg 12.5 mg twice daily, Crestor 40 mg daily -Patient is cleared to participate in cardiac rehab  2.  Essential hypertension: -Patient's blood pressure today was stable today at 138/80 -Patient reports dizziness which has started since beginning carvedilol - Change carvedilol to 12.5 mg twice daily. - Continue chlorthalidone, irbesartan, and spironolactone. - Monitor blood pressure regularly.  3.   Hyperlipidemia: -Patient's LDL cholesterol was 80 -Continue Crestor 40 mg daily  4.  DM type II: -Patient's last hemoglobin A1c was 7.2 above goal -Continue current treatment plan per PCP  5.  Morbid obesity: -Patient's BMI is 45.04 kg/m -Patient is following up with PCP regarding Trulicity and advised to a low-sodium heart healthy diet.     Cardiac Rehabilitation Eligibility Assessment       Disposition: Follow-up with Garwin Brothers, MD or APP in 3 months  Signed, Napoleon Form, Leodis Rains, NP 07/28/2023, 5:51 PM [facility]

## 2023-07-28 NOTE — Telephone Encounter (Signed)
 Pt insurance is active and benefits verified through [facility] Co-pay $25, DED $0/$0 met, out of pocket $9,350/$51.60 met, co-insurance 0%. No pre-authorization required. Passport, 3/13 @ 2:30, REF#[id number]   How many CR sessions are covered? (36 visits for TCR, 72 visits for ICR)72 Is this a lifetime maximum or an annual maximum? Annual Has the member used any of these services to date? No Is there a time limit (weeks/months) on start of program and/or program completion? No     Will contact patient to see if she is interested in the Cardiac Rehab Program. If interested, patient will need to complete follow up appt. Once completed, patient will be contacted for scheduling upon review by the RN Navigator.

## 2023-07-29 ENCOUNTER — Other Ambulatory Visit: Payer: Self-pay

## 2023-07-29 ENCOUNTER — Ambulatory Visit: Attending: Nurse Practitioner | Admitting: Nurse Practitioner

## 2023-07-29 ENCOUNTER — Encounter: Payer: Self-pay | Admitting: Nurse Practitioner

## 2023-07-29 VITALS — BP 138/80 | HR 65 | Ht 67.0 in

## 2023-07-29 DIAGNOSIS — E785 Hyperlipidemia, unspecified: Secondary | ICD-10-CM

## 2023-07-29 DIAGNOSIS — I2510 Atherosclerotic heart disease of native coronary artery without angina pectoris: Secondary | ICD-10-CM

## 2023-07-29 DIAGNOSIS — I10 Essential (primary) hypertension: Secondary | ICD-10-CM

## 2023-07-29 DIAGNOSIS — E119 Type 2 diabetes mellitus without complications: Secondary | ICD-10-CM

## 2023-07-29 DIAGNOSIS — Z6841 Body Mass Index (BMI) 40.0 and over, adult: Secondary | ICD-10-CM

## 2023-07-29 DIAGNOSIS — Z794 Long term (current) use of insulin: Secondary | ICD-10-CM | POA: Diagnosis not present

## 2023-07-29 MED ORDER — CARVEDILOL 12.5 MG PO TABS: 12.5000 mg | ORAL_TABLET | Freq: Two times a day (BID) | ORAL | 1 refills | Status: DC

## 2023-07-29 NOTE — Telephone Encounter (Unsigned)
 Copied from CRM [id number]. Topic: Clinical - Home Health Verbal Orders >> Jul 29, 2023  9:45 AM Gildardo Pounds wrote: Caller/Agency: Kelle Darting with [facility] Callback Number: [phone] Service Requested: Physical Therapy and home health aide Frequency: 1 a week for 8 weeks and home health aide 1 week 1; 2 week 2; 1 week 1 Any new concerns about the patient? Yes,

## 2023-07-29 NOTE — Patient Instructions (Addendum)
 Medication Instructions:  DECREASE Coreg to 12.5mg  Take 1 tablet once a day  *If you need a refill on your cardiac medications before your next appointment, please call your pharmacy*   Lab Work: 8 WEEKS FASTING LIPIDs & LFTs If you have labs (blood work) drawn today and your tests are completely normal, you will receive your results only by: MyChart Message (if you have MyChart) OR A paper copy in the mail If you have any lab test that is abnormal or we need to change your treatment, we will call you to review the results.   Testing/Procedures: None ordered   Follow-Up: At [facility], you and your health needs are our priority.  As part of our continuing mission to provide you with exceptional heart care, we have created designated Provider Care Teams.  These Care Teams include your primary Cardiologist (physician) and Advanced Practice Providers (APPs -  Physician Assistants and Nurse Practitioners) who all work together to provide you with the care you need, when you need it.  We recommend signing up for the patient portal called "MyChart".  Sign up information is provided on this After Visit Summary.  MyChart is used to connect with patients for Virtual Visits (Telemedicine).  Patients are able to view lab/test results, encounter notes, upcoming appointments, etc.  Non-urgent messages can be sent to your provider as well.   To learn more about what you can do with MyChart, go to [email or web address].    Your next appointment:   3 month(s)  Provider:   Garwin Brothers, MD  or Robin Searing    Other Instructions Agave nectar Do not consume more than 2000mg  of salt per day  We recommend that you decrease your intake of salt.  1. Use Morton's Salt substitute.   2. Use Ms. Dash   3. Mccormick;s salt free seasoning   4. Avoid processed meat - bacon ( including [place] bacon) , sausage, ham, hot dogs, Spam  5. Avoid [place]bells soup 6. Avoid prepared meals 7. Eat  fresh or frozen foods that specifically do not have any added salt.

## 2023-08-01 ENCOUNTER — Other Ambulatory Visit: Payer: Self-pay | Admitting: Family Medicine

## 2023-08-01 DIAGNOSIS — I493 Ventricular premature depolarization: Secondary | ICD-10-CM | POA: Diagnosis not present

## 2023-08-01 DIAGNOSIS — I509 Heart failure, unspecified: Secondary | ICD-10-CM | POA: Diagnosis not present

## 2023-08-01 DIAGNOSIS — E1122 Type 2 diabetes mellitus with diabetic chronic kidney disease: Secondary | ICD-10-CM | POA: Diagnosis not present

## 2023-08-01 DIAGNOSIS — I25110 Atherosclerotic heart disease of native coronary artery with unstable angina pectoris: Secondary | ICD-10-CM | POA: Diagnosis not present

## 2023-08-01 DIAGNOSIS — I152 Hypertension secondary to endocrine disorders: Secondary | ICD-10-CM | POA: Diagnosis not present

## 2023-08-01 DIAGNOSIS — F32A Depression, unspecified: Secondary | ICD-10-CM | POA: Diagnosis not present

## 2023-08-01 DIAGNOSIS — E1159 Type 2 diabetes mellitus with other circulatory complications: Secondary | ICD-10-CM | POA: Diagnosis not present

## 2023-08-01 DIAGNOSIS — N189 Chronic kidney disease, unspecified: Secondary | ICD-10-CM | POA: Diagnosis not present

## 2023-08-01 DIAGNOSIS — E1136 Type 2 diabetes mellitus with diabetic cataract: Secondary | ICD-10-CM | POA: Diagnosis not present

## 2023-08-01 DIAGNOSIS — E1169 Type 2 diabetes mellitus with other specified complication: Secondary | ICD-10-CM

## 2023-08-01 NOTE — Telephone Encounter (Signed)
 Verbal orders given

## 2023-08-02 ENCOUNTER — Ambulatory Visit: Payer: Self-pay | Admitting: Family Medicine

## 2023-08-02 ENCOUNTER — Other Ambulatory Visit: Payer: Self-pay | Admitting: Family Medicine

## 2023-08-02 DIAGNOSIS — E1169 Type 2 diabetes mellitus with other specified complication: Secondary | ICD-10-CM

## 2023-08-02 NOTE — Telephone Encounter (Signed)
 Unable to reach patient after 3 attempts by E2C2 NT, routing to the provider for resolution per protocol.

## 2023-08-02 NOTE — Telephone Encounter (Signed)
 Copied from CRM [id number]. Topic: Clinical - Red Word Triage >> Aug 02, 2023 11:23 AM Ivette P wrote: Red Word that prompted transfer to Nurse Triage:  right ear feel like stopped up, over a week. comes with dizziness.  went ot cardio friday 03/14 and was told to folow up with primary     Call disconnected before agent could transfer the call to this RN. Attempted to call patient back twice and was unable to reach patient. Received a message stating "call cannot be completed as dialed. Routing to call back folder for follow up.

## 2023-08-02 NOTE — Telephone Encounter (Signed)
 Called patient on cell number, answered first try. Scheduled appt for this Thursday w. JCL at 2:30pm.

## 2023-08-02 NOTE — Telephone Encounter (Signed)
Call could not be completed as dialed

## 2023-08-04 ENCOUNTER — Other Ambulatory Visit (HOSPITAL_COMMUNITY): Payer: Self-pay

## 2023-08-04 ENCOUNTER — Telehealth: Payer: Self-pay | Admitting: Family Medicine

## 2023-08-04 ENCOUNTER — Encounter: Payer: Self-pay | Admitting: Family Medicine

## 2023-08-04 ENCOUNTER — Other Ambulatory Visit: Payer: Self-pay

## 2023-08-04 ENCOUNTER — Ambulatory Visit: Admitting: Family Medicine

## 2023-08-04 VITALS — BP 130/84 | HR 71 | Wt 288.6 lb

## 2023-08-04 DIAGNOSIS — H6123 Impacted cerumen, bilateral: Secondary | ICD-10-CM

## 2023-08-04 MED ORDER — BLOOD GLUCOSE TEST VI STRP: 1.0000 | ORAL_STRIP | Freq: Two times a day (BID) | 1 refills | Status: DC

## 2023-08-04 MED ORDER — ONETOUCH ULTRASOFT LANCETS MISC: 2 refills | Status: DC

## 2023-08-04 NOTE — Telephone Encounter (Signed)
 Sandra Lawrence is requesting a refill on test strips and lancets to [id number] - [place], [place] - [address]

## 2023-08-04 NOTE — Progress Notes (Signed)
   Subjective:    Patient ID: Sandra Lawrence, female    DOB: 1945-10-04, 78 y.o.   MRN: [id number]  HPI  She does complain of difficulty with hearing from the right ear as well as occasional dizziness.  Review of Systems     Objective:    Physical Exam Both canals have cerumen in it.  Cerumen was removed from the left is still some in the right hold though the hearing test was normal on both sides.       Assessment & Plan:  Bilateral impacted cerumen Since she can hear equally from both sides, I will leave her alone.  She was comfortable with that.

## 2023-08-05 ENCOUNTER — Telehealth (HOSPITAL_COMMUNITY): Payer: Self-pay

## 2023-08-05 DIAGNOSIS — E1136 Type 2 diabetes mellitus with diabetic cataract: Secondary | ICD-10-CM | POA: Diagnosis not present

## 2023-08-05 DIAGNOSIS — I25110 Atherosclerotic heart disease of native coronary artery with unstable angina pectoris: Secondary | ICD-10-CM | POA: Diagnosis not present

## 2023-08-05 DIAGNOSIS — I493 Ventricular premature depolarization: Secondary | ICD-10-CM | POA: Diagnosis not present

## 2023-08-05 DIAGNOSIS — I509 Heart failure, unspecified: Secondary | ICD-10-CM | POA: Diagnosis not present

## 2023-08-05 DIAGNOSIS — F32A Depression, unspecified: Secondary | ICD-10-CM | POA: Diagnosis not present

## 2023-08-05 DIAGNOSIS — E1159 Type 2 diabetes mellitus with other circulatory complications: Secondary | ICD-10-CM | POA: Diagnosis not present

## 2023-08-05 DIAGNOSIS — N189 Chronic kidney disease, unspecified: Secondary | ICD-10-CM | POA: Diagnosis not present

## 2023-08-05 DIAGNOSIS — E1122 Type 2 diabetes mellitus with diabetic chronic kidney disease: Secondary | ICD-10-CM | POA: Diagnosis not present

## 2023-08-05 DIAGNOSIS — I152 Hypertension secondary to endocrine disorders: Secondary | ICD-10-CM | POA: Diagnosis not present

## 2023-08-05 NOTE — Telephone Encounter (Signed)
 Called and spoke with pt in regards to CR, pt stated she is not able to participate at this time due to her $25.00 copay.   Closed referral

## 2023-08-08 ENCOUNTER — Other Ambulatory Visit: Payer: Self-pay

## 2023-08-08 DIAGNOSIS — E1136 Type 2 diabetes mellitus with diabetic cataract: Secondary | ICD-10-CM | POA: Diagnosis not present

## 2023-08-08 DIAGNOSIS — E1122 Type 2 diabetes mellitus with diabetic chronic kidney disease: Secondary | ICD-10-CM | POA: Diagnosis not present

## 2023-08-08 DIAGNOSIS — N189 Chronic kidney disease, unspecified: Secondary | ICD-10-CM | POA: Diagnosis not present

## 2023-08-08 DIAGNOSIS — E1159 Type 2 diabetes mellitus with other circulatory complications: Secondary | ICD-10-CM | POA: Diagnosis not present

## 2023-08-08 DIAGNOSIS — I493 Ventricular premature depolarization: Secondary | ICD-10-CM | POA: Diagnosis not present

## 2023-08-08 DIAGNOSIS — I25110 Atherosclerotic heart disease of native coronary artery with unstable angina pectoris: Secondary | ICD-10-CM | POA: Diagnosis not present

## 2023-08-08 DIAGNOSIS — I509 Heart failure, unspecified: Secondary | ICD-10-CM | POA: Diagnosis not present

## 2023-08-08 DIAGNOSIS — I152 Hypertension secondary to endocrine disorders: Secondary | ICD-10-CM | POA: Diagnosis not present

## 2023-08-08 DIAGNOSIS — F32A Depression, unspecified: Secondary | ICD-10-CM | POA: Diagnosis not present

## 2023-08-08 MED ORDER — ONETOUCH ULTRASOFT LANCETS MISC: 2 refills | Status: AC

## 2023-08-08 MED ORDER — BLOOD GLUCOSE TEST VI STRP: 1.0000 | ORAL_STRIP | Freq: Two times a day (BID) | 1 refills | Status: AC

## 2023-08-11 DIAGNOSIS — M545 Low back pain, unspecified: Secondary | ICD-10-CM | POA: Diagnosis not present

## 2023-08-12 DIAGNOSIS — I493 Ventricular premature depolarization: Secondary | ICD-10-CM | POA: Diagnosis not present

## 2023-08-12 DIAGNOSIS — I25110 Atherosclerotic heart disease of native coronary artery with unstable angina pectoris: Secondary | ICD-10-CM | POA: Diagnosis not present

## 2023-08-12 DIAGNOSIS — E1122 Type 2 diabetes mellitus with diabetic chronic kidney disease: Secondary | ICD-10-CM | POA: Diagnosis not present

## 2023-08-12 DIAGNOSIS — E1136 Type 2 diabetes mellitus with diabetic cataract: Secondary | ICD-10-CM | POA: Diagnosis not present

## 2023-08-12 DIAGNOSIS — N189 Chronic kidney disease, unspecified: Secondary | ICD-10-CM | POA: Diagnosis not present

## 2023-08-12 DIAGNOSIS — I152 Hypertension secondary to endocrine disorders: Secondary | ICD-10-CM | POA: Diagnosis not present

## 2023-08-12 DIAGNOSIS — F32A Depression, unspecified: Secondary | ICD-10-CM | POA: Diagnosis not present

## 2023-08-12 DIAGNOSIS — E1159 Type 2 diabetes mellitus with other circulatory complications: Secondary | ICD-10-CM | POA: Diagnosis not present

## 2023-08-12 DIAGNOSIS — I509 Heart failure, unspecified: Secondary | ICD-10-CM | POA: Diagnosis not present

## 2023-08-15 ENCOUNTER — Telehealth: Payer: Self-pay | Admitting: Cardiology

## 2023-08-15 DIAGNOSIS — I152 Hypertension secondary to endocrine disorders: Secondary | ICD-10-CM | POA: Diagnosis not present

## 2023-08-15 DIAGNOSIS — E1159 Type 2 diabetes mellitus with other circulatory complications: Secondary | ICD-10-CM | POA: Diagnosis not present

## 2023-08-15 DIAGNOSIS — I493 Ventricular premature depolarization: Secondary | ICD-10-CM | POA: Diagnosis not present

## 2023-08-15 DIAGNOSIS — I509 Heart failure, unspecified: Secondary | ICD-10-CM | POA: Diagnosis not present

## 2023-08-15 DIAGNOSIS — E1122 Type 2 diabetes mellitus with diabetic chronic kidney disease: Secondary | ICD-10-CM | POA: Diagnosis not present

## 2023-08-15 DIAGNOSIS — N189 Chronic kidney disease, unspecified: Secondary | ICD-10-CM | POA: Diagnosis not present

## 2023-08-15 DIAGNOSIS — I25110 Atherosclerotic heart disease of native coronary artery with unstable angina pectoris: Secondary | ICD-10-CM | POA: Diagnosis not present

## 2023-08-15 DIAGNOSIS — E1136 Type 2 diabetes mellitus with diabetic cataract: Secondary | ICD-10-CM | POA: Diagnosis not present

## 2023-08-15 DIAGNOSIS — F32A Depression, unspecified: Secondary | ICD-10-CM | POA: Diagnosis not present

## 2023-08-15 NOTE — Telephone Encounter (Signed)
 Patient would like to know if it is alright to have an MRI since she recently has stents placed. Please advise.

## 2023-08-15 NOTE — Telephone Encounter (Signed)
 Called patient and she was questioning whether she could have an MRI after having stents placed in her coronary arteries. Spoke with Dr. Bing Matter and he stated that there was not a reason she could not have the MRI after having coronary stent's place. I relayed this information to the patient and she verbalized understanding and had no further questions at this time.

## 2023-08-16 ENCOUNTER — Other Ambulatory Visit (HOSPITAL_COMMUNITY): Payer: Self-pay

## 2023-08-16 ENCOUNTER — Other Ambulatory Visit: Payer: Self-pay

## 2023-08-16 DIAGNOSIS — F32A Depression, unspecified: Secondary | ICD-10-CM | POA: Diagnosis not present

## 2023-08-16 DIAGNOSIS — I152 Hypertension secondary to endocrine disorders: Secondary | ICD-10-CM | POA: Diagnosis not present

## 2023-08-16 DIAGNOSIS — N189 Chronic kidney disease, unspecified: Secondary | ICD-10-CM | POA: Diagnosis not present

## 2023-08-16 DIAGNOSIS — K5790 Diverticulosis of intestine, part unspecified, without perforation or abscess without bleeding: Secondary | ICD-10-CM

## 2023-08-16 DIAGNOSIS — I509 Heart failure, unspecified: Secondary | ICD-10-CM | POA: Diagnosis not present

## 2023-08-16 DIAGNOSIS — E1136 Type 2 diabetes mellitus with diabetic cataract: Secondary | ICD-10-CM | POA: Diagnosis not present

## 2023-08-16 DIAGNOSIS — M51369 Other intervertebral disc degeneration, lumbar region without mention of lumbar back pain or lower extremity pain: Secondary | ICD-10-CM

## 2023-08-16 DIAGNOSIS — E1122 Type 2 diabetes mellitus with diabetic chronic kidney disease: Secondary | ICD-10-CM | POA: Diagnosis not present

## 2023-08-16 DIAGNOSIS — I25110 Atherosclerotic heart disease of native coronary artery with unstable angina pectoris: Secondary | ICD-10-CM | POA: Diagnosis not present

## 2023-08-16 DIAGNOSIS — E1159 Type 2 diabetes mellitus with other circulatory complications: Secondary | ICD-10-CM | POA: Diagnosis not present

## 2023-08-16 DIAGNOSIS — I493 Ventricular premature depolarization: Secondary | ICD-10-CM | POA: Diagnosis not present

## 2023-08-16 DIAGNOSIS — J45909 Unspecified asthma, uncomplicated: Secondary | ICD-10-CM

## 2023-08-18 ENCOUNTER — Other Ambulatory Visit: Payer: Medicare PPO

## 2023-08-19 DIAGNOSIS — M545 Low back pain, unspecified: Secondary | ICD-10-CM | POA: Diagnosis not present

## 2023-08-22 DIAGNOSIS — I493 Ventricular premature depolarization: Secondary | ICD-10-CM | POA: Diagnosis not present

## 2023-08-22 DIAGNOSIS — I25110 Atherosclerotic heart disease of native coronary artery with unstable angina pectoris: Secondary | ICD-10-CM | POA: Diagnosis not present

## 2023-08-22 DIAGNOSIS — E1136 Type 2 diabetes mellitus with diabetic cataract: Secondary | ICD-10-CM | POA: Diagnosis not present

## 2023-08-22 DIAGNOSIS — E1122 Type 2 diabetes mellitus with diabetic chronic kidney disease: Secondary | ICD-10-CM | POA: Diagnosis not present

## 2023-08-22 DIAGNOSIS — E1159 Type 2 diabetes mellitus with other circulatory complications: Secondary | ICD-10-CM | POA: Diagnosis not present

## 2023-08-22 DIAGNOSIS — I152 Hypertension secondary to endocrine disorders: Secondary | ICD-10-CM | POA: Diagnosis not present

## 2023-08-22 DIAGNOSIS — N189 Chronic kidney disease, unspecified: Secondary | ICD-10-CM | POA: Diagnosis not present

## 2023-08-22 DIAGNOSIS — I509 Heart failure, unspecified: Secondary | ICD-10-CM | POA: Diagnosis not present

## 2023-08-22 DIAGNOSIS — F32A Depression, unspecified: Secondary | ICD-10-CM | POA: Diagnosis not present

## 2023-08-23 ENCOUNTER — Encounter: Payer: Medicare PPO | Admitting: Family Medicine

## 2023-08-24 DIAGNOSIS — I25110 Atherosclerotic heart disease of native coronary artery with unstable angina pectoris: Secondary | ICD-10-CM | POA: Diagnosis not present

## 2023-08-24 DIAGNOSIS — E1122 Type 2 diabetes mellitus with diabetic chronic kidney disease: Secondary | ICD-10-CM | POA: Diagnosis not present

## 2023-08-24 DIAGNOSIS — E1136 Type 2 diabetes mellitus with diabetic cataract: Secondary | ICD-10-CM | POA: Diagnosis not present

## 2023-08-24 DIAGNOSIS — I493 Ventricular premature depolarization: Secondary | ICD-10-CM | POA: Diagnosis not present

## 2023-08-24 DIAGNOSIS — I152 Hypertension secondary to endocrine disorders: Secondary | ICD-10-CM | POA: Diagnosis not present

## 2023-08-24 DIAGNOSIS — N189 Chronic kidney disease, unspecified: Secondary | ICD-10-CM | POA: Diagnosis not present

## 2023-08-24 DIAGNOSIS — E1159 Type 2 diabetes mellitus with other circulatory complications: Secondary | ICD-10-CM | POA: Diagnosis not present

## 2023-08-24 DIAGNOSIS — I509 Heart failure, unspecified: Secondary | ICD-10-CM | POA: Diagnosis not present

## 2023-08-24 DIAGNOSIS — F32A Depression, unspecified: Secondary | ICD-10-CM | POA: Diagnosis not present

## 2023-08-29 DIAGNOSIS — N189 Chronic kidney disease, unspecified: Secondary | ICD-10-CM | POA: Diagnosis not present

## 2023-08-29 DIAGNOSIS — E1159 Type 2 diabetes mellitus with other circulatory complications: Secondary | ICD-10-CM | POA: Diagnosis not present

## 2023-08-29 DIAGNOSIS — I25110 Atherosclerotic heart disease of native coronary artery with unstable angina pectoris: Secondary | ICD-10-CM | POA: Diagnosis not present

## 2023-08-29 DIAGNOSIS — F32A Depression, unspecified: Secondary | ICD-10-CM | POA: Diagnosis not present

## 2023-08-29 DIAGNOSIS — I493 Ventricular premature depolarization: Secondary | ICD-10-CM | POA: Diagnosis not present

## 2023-08-29 DIAGNOSIS — E1136 Type 2 diabetes mellitus with diabetic cataract: Secondary | ICD-10-CM | POA: Diagnosis not present

## 2023-08-29 DIAGNOSIS — I509 Heart failure, unspecified: Secondary | ICD-10-CM | POA: Diagnosis not present

## 2023-08-29 DIAGNOSIS — E1122 Type 2 diabetes mellitus with diabetic chronic kidney disease: Secondary | ICD-10-CM | POA: Diagnosis not present

## 2023-08-29 DIAGNOSIS — I152 Hypertension secondary to endocrine disorders: Secondary | ICD-10-CM | POA: Diagnosis not present

## 2023-08-30 ENCOUNTER — Other Ambulatory Visit: Payer: Self-pay | Admitting: Nurse Practitioner

## 2023-08-30 ENCOUNTER — Telehealth: Payer: Self-pay | Admitting: Pharmacist

## 2023-08-30 NOTE — Progress Notes (Signed)
 Patient ID: [name]                 DOB: Nov 01, 1945                    MRN: [id number]      HPI: Sandra Lawrence is a 78 y.o. female patient referred to lipid clinic by [facility]. PMH is significant for CAD s/p LHC with PCI/DES to mid circumflex stenosis mild nonobstructive plaquing in proximal and mid LAD (06/2023), HTN, HLD DM type II, OSA (intolerant to CPAP)   Patient presented today for lipid clinic. Reports she has been tolerating increased dose Crestor well without any issue. She follows heart healthy diet and exercise daily post procedure-06/2023. She was put on Mounjaro last month but his PCP is retiring so wants me to manage her Mounjaro dose titration. Reviewed options for lowering LDL cholesterol, including ezetimibe, PCSK-9 inhibitors, bempedoic acid and inclisiran.  Discussed mechanisms of action, dosing, side effects and potential decreases in LDL cholesterol.  Also reviewed cost information and potential options for patient assistance.  Current Medications: Crestor 40 mg daily  Intolerances: none  Risk Factors: CAD s/p LHC with PCI/DES,HTN, HLD DM type II, OSA LDL goal: <55 mg/dl Recent lab: TC 621, TG 51, HDL 53, LDL 80 while on Crestor 20 mg daily dose went up post PCI,Increasing rosuvastatin from 20 to 40 mg/day only reduces LDL-C an additional ~3 to 4% of the baseline value.  Diet: follow heat healthy diet, low salt, low sugar and low carbohydrate. Started taking Mounjaro from last month   Exercise: chair exercise every day that she had learn from rehab   [facility] come to home once a week   Family History: does not know much about family history as she was adopted  Mother - diabetes and Father cancer , sister- BP   Social History:  Alcohol: none  Smoking : none   Labs:  Lipid Panel     Component Value Date/Time   CHOL 143 07/16/2023 0418   CHOL 153 11/17/2022 1232   TRIG 51 07/16/2023 0418   HDL 53 07/16/2023 0418   HDL 53 11/17/2022 1232    CHOLHDL 2.7 07/16/2023 0418   VLDL 10 07/16/2023 0418   LDLCALC 80 07/16/2023 0418   LDLCALC 82 11/17/2022 1232   LABVLDL 18 11/17/2022 1232    Past Medical History:  Diagnosis Date   Anemia    Anxiety    Asthma    has inhalers   CAD (coronary artery disease) 02/15/2007   Cataract associated with type 2 diabetes mellitus (HCC) 07/21/2015   DDD (degenerative disc disease), lumbar 02/04/2016   Depression    Diabetes mellitus (HCC)    diet controlled- NO MEDS   Diverticulosis    DJD (degenerative joint disease) 03/17/2011   Essential hypertension 05/27/2021   GERD (gastroesophageal reflux disease)    History of colonic polyps 09/05/2012   Tubular adenoma   History of kidney stones    Hypercholesteremia    Morbid obesity with BMI of 40.0-44.9, adult (HCC) 03/17/2011   OAB (overactive bladder) 11/04/2016   Osteoporosis 06/01/2021   Sleep apnea    No cpap does not tolerate   Tubular adenoma of colon     Current Outpatient Medications on File Prior to Visit  Medication Sig Dispense Refill   acetaminophen (TYLENOL) 500 MG tablet Take 2 tablets (1,000 mg total) by mouth 3 (three) times daily as needed for mild pain, moderate pain, fever  or headache. 30 tablet 0   albuterol (VENTOLIN HFA) 108 (90 Base) MCG/ACT inhaler INHALE 1-2 PUFFS BY MOUTH EVERY 6 HOURS AS NEEDED FOR WHEEZE OR SHORTNESS OF BREATH 8.5 each 2   aspirin EC 81 MG tablet Take 1 tablet (81 mg total) by mouth daily. Swallow whole. 30 tablet 12   Blood Glucose Monitoring Suppl DEVI 1 each by Does not apply route in the morning and at bedtime. May substitute to any manufacturer covered by patient's insurance. 1 each 0   calcium carbonate (TUMS - DOSED IN MG ELEMENTAL CALCIUM) 500 MG chewable tablet Chew 3-4 tablets by mouth daily.     Calcium Carbonate Antacid (ALKA-SELTZER ANTACID PO) Take by mouth as needed.     carvedilol (COREG) 12.5 MG tablet TAKE 1 TABLET (12.5MG  TOTAL) BY MOUTH TWICE A DAY WITH MEALS 180 tablet 0    chlorthalidone (HYGROTON) 25 MG tablet Take 1 tablet (25 mg total) by mouth daily. 90 tablet 2   clopidogrel (PLAVIX) 75 MG tablet Take 1 tablet (75 mg total) by mouth daily with breakfast. 90 tablet 3   Fluticasone Furoate (ARNUITY ELLIPTA) 100 MCG/ACT AEPB Inhale 2 puffs into the lungs daily. 90 each 5   glipiZIDE (GLUCOTROL XL) 5 MG 24 hr tablet Take 5 mg by mouth daily with breakfast.     glucose blood (ACCU-CHEK GUIDE) test strip Use as instructed 100 each 12   Glucose Blood (BLOOD GLUCOSE TEST STRIPS) STRP 1 each by Does not apply route in the morning and at bedtime. Please give the pt. Test strips for Prodigy glucose monitor 200 each 1   irbesartan (AVAPRO) 300 MG tablet Take 1 tablet (300 mg total) by mouth daily. 90 tablet 1   Lancets (ONETOUCH ULTRASOFT) lancets Use as instructed 100 each 2   Multiple Vitamins-Minerals (CENTRUM SILVER PO) Take 1 capsule by mouth daily.     nitroGLYCERIN (NITROSTAT) 0.4 MG SL tablet Place 1 tablet (0.4 mg total) under the tongue every 5 (five) minutes as needed for chest pain. 28 tablet 0   rosuvastatin (CRESTOR) 40 MG tablet Take 1 tablet (40 mg total) by mouth daily. 90 tablet 3   solifenacin (VESICARE) 10 MG tablet Take 1 tablet (10 mg total) by mouth daily. 90 tablet 0   spironolactone (ALDACTONE) 25 MG tablet Take 0.5 tablets (12.5 mg total) by mouth daily. 90 tablet 1   Vitamin D, Ergocalciferol, (DRISDOL) 1.25 MG (50000 UNIT) CAPS capsule Take by mouth.     No current facility-administered medications on file prior to visit.    Allergies  Allergen Reactions   Amlodipine     Gum issues   Farxiga [Dapagliflozin]     Yeast infection   Metformin And Related     GI upset    Assessment/Plan:  1. Hyperlipidemia -  Problem  Hyperlipidemia With Target Low Density Lipoprotein (Ldl) Cholesterol Less Than 55 Mg/Dl  Morbid Obesity (Hcc)   Hyperlipidemia with target low density lipoprotein (LDL) cholesterol less than 55 mg/dL Assessment and  Plan:  Current Medications: Crestor 40 mg daily tolerates it well  Intolerances: none  Risk Factors: CAD s/p LHC with PCI/DES,HTN, HLD DM type II, OSA LDL goal: <55 mg/dl Recent lab: TC 045, TG 51, HDL 53, LDL 80 while on Crestor 20 mg daily dose went up post PCI,Increasing rosuvastatin from 20 to 40 mg/day only reduces LDL-C an additional ~3 to 4% of the baseline value Follows heart healthy diet and exercise daily for 15-20 min  Discussed next  potential options (PCSK-9 inhibitors, bempedoic acid and inclisiran); cost, dosing efficacy, side effects  Received PA approval for Repatha, patient to continue taking Crestor 40 mg daily and start taking Repatha 140 mg under the skin every 14 days  Will repeat lab on 11/21/2023       Morbid obesity (HCC) Assessment and Plan:  Mounjaro started last month by PCP.Off of glipizide, PCP retiring want us  to manage Mounjaro dose titration until find new PCP.So far took 3 doses of Mounjaro 2.5 mg and tolerated well will up the dose to 5 mg once a week and f/u in 3 weeks for further dose titration      Thank you,  Nickola Baron, [address] [facility] A Division of [facility] [address], [place], [place] [address]  Phone: [phone]; Fax: [phone]

## 2023-08-31 ENCOUNTER — Ambulatory Visit: Attending: Cardiovascular Disease | Admitting: Pharmacist

## 2023-08-31 ENCOUNTER — Encounter: Payer: Self-pay | Admitting: Pharmacist

## 2023-08-31 ENCOUNTER — Telehealth: Payer: Self-pay | Admitting: Pharmacy Technician

## 2023-08-31 ENCOUNTER — Other Ambulatory Visit (HOSPITAL_COMMUNITY): Payer: Self-pay

## 2023-08-31 DIAGNOSIS — E785 Hyperlipidemia, unspecified: Secondary | ICD-10-CM | POA: Diagnosis not present

## 2023-08-31 MED ORDER — MOUNJARO 5 MG/0.5ML ~~LOC~~ SOAJ
5.0000 mg | SUBCUTANEOUS | 0 refills | Status: DC
  Filled 2023-08-31: qty 2, 28d supply, fill #0

## 2023-08-31 MED ORDER — REPATHA SURECLICK 140 MG/ML ~~LOC~~ SOAJ
140.0000 mg | SUBCUTANEOUS | 3 refills | Status: AC
  Filled 2023-08-31: qty 6, 84d supply, fill #0
  Filled 2023-11-14: qty 6, 84d supply, fill #1
  Filled 2024-02-29: qty 6, 84d supply, fill #2
  Filled 2024-05-18: qty 6, 84d supply, fill #3

## 2023-08-31 NOTE — Patient Instructions (Signed)
 Your Results:             Your most recent labs Goal  Total Cholesterol 143 < 200  Triglycerides 51 < 150  HDL (happy/good cholesterol) 53 > 40  LDL (lousy/bad cholesterol 80 < 55   Medication changes: Continue taking Crestor 40 mg daily and start taking Repatha 140 mg under the skin every 14 days    Repatha is a cholesterol medication that improved your body's ability to get rid of "bad cholesterol" known as LDL. It can lower your LDL up to 60%! It is an injection that is given under the skin every 2 weeks. The medication often requires a prior authorization from your insurance company. The most common side effects of Repatha include runny nose, symptoms of the common cold, rarely flu or flu-like symptoms, back/muscle pain in about 3-4% of the patients, and redness, pain, or bruising at the injection site.   Lab orders: Follow up lipid lab due on July 07,2025.

## 2023-08-31 NOTE — Assessment & Plan Note (Signed)
 Assessment and Plan:  Mounjaro started last month by PCP.Off of glipizide, PCP retiring want us  to manage Mounjaro dose titration until find new PCP.So far took 3 doses of Mounjaro 2.5 mg and tolerated well will up the dose to 5 mg once a week and f/u in 3 weeks for further dose titration

## 2023-08-31 NOTE — Telephone Encounter (Signed)
 Pharmacy Patient Advocate Encounter   Received notification from Pt Calls Messages that prior authorization for Repatha is required/requested.   Insurance verification completed.   The patient is insured through [employer] .   Per test claim: PA required; PA submitted to above mentioned insurance via CoverMyMeds Key/confirmation #/EOC [id number] Status is pending

## 2023-08-31 NOTE — Assessment & Plan Note (Signed)
 Assessment and Plan:  Current Medications: Crestor 40 mg daily tolerates it well  Intolerances: none  Risk Factors: CAD s/p LHC with PCI/DES,HTN, HLD DM type II, OSA LDL goal: <55 mg/dl Recent lab: TC 528, TG 51, HDL 53, LDL 80 while on Crestor 20 mg daily dose went up post PCI,Increasing rosuvastatin from 20 to 40 mg/day only reduces LDL-C an additional ~3 to 4% of the baseline value Follows heart healthy diet and exercise daily for 15-20 min  Discussed next potential options (PCSK-9 inhibitors, bempedoic acid and inclisiran); cost, dosing efficacy, side effects  Received PA approval for Repatha, patient to continue taking Crestor 40 mg daily and start taking Repatha 140 mg under the skin every 14 days  Will repeat lab on 11/21/2023

## 2023-08-31 NOTE — Telephone Encounter (Signed)
 Pharmacy Patient Advocate Encounter  Received notification from [employer] that Prior Authorization for repatha has been APPROVED from 05/18/23 to 05/16/24. Ran test claim, Copay is $0.00- one month. This test claim was processed through [facility]- copay amounts may vary at other pharmacies due to pharmacy/plan contracts, or as the patient moves through the different stages of their insurance plan.   PA #/Case ID/Reference #: [id number]

## 2023-09-01 NOTE — Telephone Encounter (Signed)
 PA approved see other encounter for more info

## 2023-09-05 DIAGNOSIS — N189 Chronic kidney disease, unspecified: Secondary | ICD-10-CM | POA: Diagnosis not present

## 2023-09-05 DIAGNOSIS — E1136 Type 2 diabetes mellitus with diabetic cataract: Secondary | ICD-10-CM | POA: Diagnosis not present

## 2023-09-05 DIAGNOSIS — E1122 Type 2 diabetes mellitus with diabetic chronic kidney disease: Secondary | ICD-10-CM | POA: Diagnosis not present

## 2023-09-05 DIAGNOSIS — I152 Hypertension secondary to endocrine disorders: Secondary | ICD-10-CM | POA: Diagnosis not present

## 2023-09-05 DIAGNOSIS — I25110 Atherosclerotic heart disease of native coronary artery with unstable angina pectoris: Secondary | ICD-10-CM | POA: Diagnosis not present

## 2023-09-05 DIAGNOSIS — I493 Ventricular premature depolarization: Secondary | ICD-10-CM | POA: Diagnosis not present

## 2023-09-05 DIAGNOSIS — I509 Heart failure, unspecified: Secondary | ICD-10-CM | POA: Diagnosis not present

## 2023-09-05 DIAGNOSIS — E1159 Type 2 diabetes mellitus with other circulatory complications: Secondary | ICD-10-CM | POA: Diagnosis not present

## 2023-09-05 DIAGNOSIS — F32A Depression, unspecified: Secondary | ICD-10-CM | POA: Diagnosis not present

## 2023-09-07 ENCOUNTER — Ambulatory Visit
Admission: RE | Admit: 2023-09-07 | Discharge: 2023-09-07 | Disposition: A | Discharge status: Home / Self Care | Source: Ambulatory Visit | Attending: Family Medicine | Admitting: Family Medicine

## 2023-09-07 ENCOUNTER — Other Ambulatory Visit: Payer: Self-pay | Admitting: Family Medicine

## 2023-09-07 ENCOUNTER — Ambulatory Visit: Admitting: Pharmacist

## 2023-09-07 ENCOUNTER — Encounter: Payer: Self-pay | Admitting: Family Medicine

## 2023-09-07 DIAGNOSIS — E1136 Type 2 diabetes mellitus with diabetic cataract: Secondary | ICD-10-CM | POA: Diagnosis not present

## 2023-09-07 DIAGNOSIS — Z1231 Encounter for screening mammogram for malignant neoplasm of breast: Secondary | ICD-10-CM

## 2023-09-07 DIAGNOSIS — I509 Heart failure, unspecified: Secondary | ICD-10-CM | POA: Diagnosis not present

## 2023-09-07 DIAGNOSIS — M8588 Other specified disorders of bone density and structure, other site: Secondary | ICD-10-CM | POA: Diagnosis not present

## 2023-09-07 DIAGNOSIS — F32A Depression, unspecified: Secondary | ICD-10-CM | POA: Diagnosis not present

## 2023-09-07 DIAGNOSIS — I25110 Atherosclerotic heart disease of native coronary artery with unstable angina pectoris: Secondary | ICD-10-CM | POA: Diagnosis not present

## 2023-09-07 DIAGNOSIS — I493 Ventricular premature depolarization: Secondary | ICD-10-CM | POA: Diagnosis not present

## 2023-09-07 DIAGNOSIS — N189 Chronic kidney disease, unspecified: Secondary | ICD-10-CM | POA: Diagnosis not present

## 2023-09-07 DIAGNOSIS — E1122 Type 2 diabetes mellitus with diabetic chronic kidney disease: Secondary | ICD-10-CM | POA: Diagnosis not present

## 2023-09-07 DIAGNOSIS — E1159 Type 2 diabetes mellitus with other circulatory complications: Secondary | ICD-10-CM | POA: Diagnosis not present

## 2023-09-07 DIAGNOSIS — N958 Other specified menopausal and perimenopausal disorders: Secondary | ICD-10-CM | POA: Diagnosis not present

## 2023-09-07 DIAGNOSIS — I152 Hypertension secondary to endocrine disorders: Secondary | ICD-10-CM | POA: Diagnosis not present

## 2023-09-08 ENCOUNTER — Telehealth: Payer: Self-pay | Admitting: Internal Medicine

## 2023-09-08 ENCOUNTER — Other Ambulatory Visit (HOSPITAL_COMMUNITY): Payer: Self-pay

## 2023-09-08 ENCOUNTER — Telehealth: Payer: Self-pay

## 2023-09-08 NOTE — Telephone Encounter (Signed)
 Pharmacy Patient Advocate Encounter  Received notification from [employer] that Prior Authorization for PROLIA has been APPROVED from 09/08/23 to 05/16/24. Ran test claim, Copay is $0. This test claim was processed through [facility]- copay amounts may vary at other pharmacies due to pharmacy/plan contracts, or as the patient moves through the different stages of their insurance plan.   PA #/Case ID/Reference #: [id number]

## 2023-09-08 NOTE — Telephone Encounter (Signed)
 [employer] PA submitted via CMM. Key: [id number]

## 2023-09-08 NOTE — Telephone Encounter (Signed)
 Benefit verification started in separate encounter. Will route encounter back once verification is complete.

## 2023-09-08 NOTE — Telephone Encounter (Signed)
 Prolia VOB initiated via [email or web address]  Next Prolia inj DUE: NEW START

## 2023-09-08 NOTE — Telephone Encounter (Signed)
 Can we check to see if Prolia is covered under her benefits

## 2023-09-12 ENCOUNTER — Other Ambulatory Visit: Payer: Self-pay

## 2023-09-12 ENCOUNTER — Other Ambulatory Visit (HOSPITAL_COMMUNITY): Payer: Self-pay

## 2023-09-12 DIAGNOSIS — E1122 Type 2 diabetes mellitus with diabetic chronic kidney disease: Secondary | ICD-10-CM | POA: Diagnosis not present

## 2023-09-12 DIAGNOSIS — N189 Chronic kidney disease, unspecified: Secondary | ICD-10-CM | POA: Diagnosis not present

## 2023-09-12 DIAGNOSIS — F32A Depression, unspecified: Secondary | ICD-10-CM | POA: Diagnosis not present

## 2023-09-12 DIAGNOSIS — I509 Heart failure, unspecified: Secondary | ICD-10-CM | POA: Diagnosis not present

## 2023-09-12 DIAGNOSIS — I25110 Atherosclerotic heart disease of native coronary artery with unstable angina pectoris: Secondary | ICD-10-CM | POA: Diagnosis not present

## 2023-09-12 DIAGNOSIS — E1136 Type 2 diabetes mellitus with diabetic cataract: Secondary | ICD-10-CM | POA: Diagnosis not present

## 2023-09-12 DIAGNOSIS — I152 Hypertension secondary to endocrine disorders: Secondary | ICD-10-CM | POA: Diagnosis not present

## 2023-09-12 DIAGNOSIS — E1159 Type 2 diabetes mellitus with other circulatory complications: Secondary | ICD-10-CM | POA: Diagnosis not present

## 2023-09-12 DIAGNOSIS — I493 Ventricular premature depolarization: Secondary | ICD-10-CM | POA: Diagnosis not present

## 2023-09-12 MED ORDER — CARVEDILOL 12.5 MG PO TABS: 12.5000 mg | ORAL_TABLET | Freq: Two times a day (BID) | ORAL | 3 refills | Status: DC

## 2023-09-12 NOTE — Telephone Encounter (Signed)
 Pt's medication was sent to pt's pharmacy as requested. Confirmation received.

## 2023-09-13 DIAGNOSIS — M545 Low back pain, unspecified: Secondary | ICD-10-CM | POA: Diagnosis not present

## 2023-09-15 ENCOUNTER — Other Ambulatory Visit: Payer: Self-pay

## 2023-09-15 ENCOUNTER — Encounter (HOSPITAL_COMMUNITY): Payer: Self-pay

## 2023-09-15 ENCOUNTER — Other Ambulatory Visit (HOSPITAL_COMMUNITY): Payer: Self-pay

## 2023-09-15 MED ORDER — DENOSUMAB 60 MG/ML ~~LOC~~ SOSY
60.0000 mg | PREFILLED_SYRINGE | Freq: Once | SUBCUTANEOUS | 0 refills | Status: AC
  Filled 2023-09-15: qty 1, 1d supply, fill #0

## 2023-09-15 NOTE — Addendum Note (Signed)
 Addended by: Charliene Conte A on: 09/15/2023 03:44 PM   Modules accepted: Orders

## 2023-09-15 NOTE — Telephone Encounter (Signed)
 Pt ready for scheduling for PROLIA  on or after : 09/15/23  Option# 1: Buy/Bill (Office supplied medication)  Out-of-pocket cost due at time of clinic visit: $??? - MUST FOLLOW FEE SCHEDULE  Number of injection/visits approved: ---  Primary: [employer] Prolia  co-insurance: ??? Admin fee co-insurance: ???  Secondary: --- Prolia  co-insurance:  Admin fee co-insurance:   Medical Benefit Details: Date Benefits were checked: 09/15/23 Deductible: NO/ Coinsurance: ???/ Admin Fee: ???  Prior Auth: APPROVED PA# [id number]  Expiration Date: 09/08/23-05/16/24   # of doses approved: 2 ----------------------------------------------------------------------- Option# 2- Med Obtained from pharmacy:  Pharmacy benefit: Copay $0 (Paid to pharmacy) Admin Fee: --- (Pay at clinic)  Prior Auth: N/A PA# Expiration Date:   # of doses approved:   If patient wants fill through the pharmacy benefit please send prescription to: [employer], and include estimated need by date in rx notes. Pharmacy will ship medication directly to the office.  Patient NOT eligible for Prolia  Copay Card. Copay Card can make patient's cost as little as $25. Link to apply: [email or web address]://[email or web address]  ** This summary of benefits is an estimation of the patient's out-of-pocket cost. Exact cost may very based on individual plan coverage.

## 2023-09-15 NOTE — Telephone Encounter (Signed)
 I have sent to [facility] pharmacy. Once received will schedule

## 2023-09-15 NOTE — Telephone Encounter (Signed)
 [employer] can no longer give estimates/quotes on non-chemotherapy drugs. Provider must follow the Fee schedule or use CMS website.

## 2023-09-16 ENCOUNTER — Ambulatory Visit: Payer: Medicare PPO | Admitting: Internal Medicine

## 2023-09-16 ENCOUNTER — Other Ambulatory Visit: Payer: Self-pay

## 2023-09-16 ENCOUNTER — Other Ambulatory Visit (HOSPITAL_COMMUNITY): Payer: Self-pay

## 2023-09-16 NOTE — Progress Notes (Signed)
 Patient states she will not be filling Prolia  due to side effects. Dis-enrolling for now.

## 2023-09-16 NOTE — Progress Notes (Signed)
 Pharmacy Patient Advocate Encounter  Insurance verification completed.   The patient is insured through [facility]   Ran test claim for Prolia . Co-pay is $0.  This test claim was processed through [facility]- copay amounts may vary at other pharmacies due to pharmacy/plan contracts, or as the patient moves through the different stages of their insurance plan.

## 2023-09-19 ENCOUNTER — Ambulatory Visit: Payer: Medicare PPO | Attending: Internal Medicine | Admitting: Internal Medicine

## 2023-09-19 ENCOUNTER — Encounter: Payer: Self-pay | Admitting: Internal Medicine

## 2023-09-19 VITALS — BP 126/72 | HR 77 | Ht 66.5 in | Wt 275.0 lb

## 2023-09-19 DIAGNOSIS — I2510 Atherosclerotic heart disease of native coronary artery without angina pectoris: Secondary | ICD-10-CM

## 2023-09-19 DIAGNOSIS — I152 Hypertension secondary to endocrine disorders: Secondary | ICD-10-CM | POA: Diagnosis not present

## 2023-09-19 DIAGNOSIS — E785 Hyperlipidemia, unspecified: Secondary | ICD-10-CM | POA: Diagnosis not present

## 2023-09-19 DIAGNOSIS — E1159 Type 2 diabetes mellitus with other circulatory complications: Secondary | ICD-10-CM

## 2023-09-19 NOTE — Telephone Encounter (Signed)
 Per pharmacist at [facility] Patient states she will not be filling Prolia  due to side effects. Dis-enrolling for now.

## 2023-09-19 NOTE — Progress Notes (Signed)
 Cardiology Office Note:  .    Date:  09/19/2023  ID:  Sandra Lawrence, DOB Mar 08, 1946, MRN [id number] PCP: Watson Hacking, MD  [facility] Providers Cardiologist:  Jann Melody, MD     CC: Transition to new cardiologist   History of Present Illness: .    Sandra Lawrence is a 78 y.o. female with coronary artery disease who presents for post-PCI follow-up. She was referred by Dr. Eusebio High for continuation of care post-PCI.  She has a history of coronary artery disease, initially identified in 2009 with mild non-obstructive disease. In March 2025, she experienced a non-ST elevation myocardial infarction (NSTEMI), which led to the placement of a stent. She is currently on dual antiplatelet therapy and other medications, including a GLP-1 receptor agonist and a PCSK9 inhibitor. Her LDL levels remain above target, and she is scheduled for lab work on November 21, 2023, to reassess her lipid profile.  She feels 'wonderful' and engages in daily exercise, following routines taught in traditional rehab. However, she is limited by back pain, which restricts her walking to about 15-20 steps before severe pain ensues. She has been diagnosed with osteoarthritis and nerve damage in her back, as indicated by a bone density scan and MRI. She avoids exercises that exacerbate her back pain.  Her weight has decreased from 297 pounds post-hospitalization to 275 pounds, which she attributes to her exercise and dietary habits. She occasionally indulges in foods like fried fish but generally adheres to a healthy eating plan.  No current chest pain or anginal symptoms. Her blood pressure is well-controlled at 126/72 mmHg, and she is continuing her current antihypertensive regimen.  Discussed the use of AI scribe software for clinical note transcription with the patient, who gave verbal consent to proceed.   Relevant histories: .  Social - formerly Rev[facility]ar patient ROS: As per HPI.   Studies  Reviewed: .   Cardiac Studies & Procedures   ______________________________________________________________________________________________ CARDIAC CATHETERIZATION  CARDIAC CATHETERIZATION 07/18/2023  Conclusion 1.  Severe mid circumflex stenosis, treated with a 2.75 x 16 mm Synergy DES, reducing 80% eccentric stenosis to 0% stenosis post PCI 2.  Mild nonobstructive plaquing in the proximal and mid LAD without high-grade stenosis 3.  Patent, dominant RCA with mild diffuse plaquing and no significant stenoses 4.  Normal LVEDP and normal LVEF by echo assessment  Recommendations: DAPT with aspirin  and clopidogrel  x 12 months in the setting of unstable angina/ACS  Findings Coronary Findings Diagnostic  Dominance: Right  Left Main The left main is patent with no stenosis.  Divides into the LAD and left circumflex.  Left Anterior Descending The LAD is patent to the apex of the heart.  The proximal vessel is large in caliber with mild irregularity.  The mid vessel has 50% stenosis.  The distal vessel has 30% stenosis.  There are no high-grade lesions throughout the LAD distribution. Mid LAD-1 lesion is 50% stenosed. Mid LAD-2 lesion is 30% stenosed.  Left Circumflex The circumflex is patent with a tight 80% eccentric stenosis in the mid vessel just after the origin of the second OM branch Prox Cx to Mid Cx lesion is 80% stenosed. The lesion is eccentric.  Right Coronary Artery There is mild diffuse disease throughout the vessel. Dominant vessel with mild diffuse plaquing.  No significant stenoses noted.  Intervention  Prox Cx to Mid Cx lesion Stent Lesion crossed with guidewire. Pre-stent angioplasty was performed using a BALLN EMERGE MR 2.5X12. A drug-eluting stent was successfully  placed using a SYNERGY XD 2.75X16. Post-stent angioplasty was performed using a BALLN Melville EUPHORA RX 3.0X12. Maximum pressure:  16 atm. Post-Intervention Lesion Assessment The intervention was successful.  Pre-interventional TIMI flow is 3. Post-intervention TIMI flow is 3. No complications occurred at this lesion. There is a 0% residual stenosis post intervention.   STRESS TESTS  MYOCARDIAL PERFUSION IMAGING 06/17/2021  Narrative   LV perfusion is normal. There is no evidence of ischemia. There is no evidence of infarction.   Left ventricular function is normal. Nuclear stress EF: 64 %. The left ventricular ejection fraction is normal (55-65%). End diastolic cavity size is normal.   The study is normal. The study is low risk.   Prior study available for comparison from 04/11/2019. No changes compared to prior study.   ECHOCARDIOGRAM  ECHOCARDIOGRAM COMPLETE 07/16/2023  Narrative ECHOCARDIOGRAM REPORT    Patient Name:   Sandra Lawrence [facility] Date of Exam: 07/16/2023 Medical Rec #:  [id number]      Height:       67.0 in Accession #:    [id number]     Weight:       297.3 lb Date of Birth:  05-May-1946     BSA:          2.393 m Patient Age:    77 years       BP:           147/90 mmHg Patient Gender: F              HR:           67 bpm. Exam Location:  Inpatient  Procedure: 2D Echo, 3D Echo, Color Doppler and Cardiac Doppler (Both Spectral and Color Flow Doppler were utilized during procedure).  Indications:    Chest pain  History:        Patient has prior history of Echocardiogram examinations, most recent 06/17/2021. CHF, CAD, Arrythmias:PVC, Signs/Symptoms:Palptitations; Risk Factors:Sleep Apnea, Hypertension, Dyslipidemia, Diabetes and Former Smoker.  Sonographer:    Travis Friedman RDCS Referring Phys: [id number] CHRISTOPHER RONALD BERGE  IMPRESSIONS   1. Left ventricular ejection fraction, by estimation, is 60 to 65%. Left ventricular ejection fraction by 3D volume is 60 %. The left ventricle has normal function. The left ventricle has no regional wall motion abnormalities. There is mild left ventricular hypertrophy. Left ventricular diastolic parameters are indeterminate. 2. Right ventricular  systolic function is normal. The right ventricular size is normal. There is normal pulmonary artery systolic pressure. The estimated right ventricular systolic pressure is 30.9 mmHg. 3. Left atrial size was mildly dilated. 4. The mitral valve is normal in structure. Trivial mitral valve regurgitation. No evidence of mitral stenosis. 5. The aortic valve is tricuspid. Aortic valve regurgitation is not visualized. No aortic stenosis is present. 6. The inferior vena cava is normal in size with greater than 50% respiratory variability, suggesting right atrial pressure of 3 mmHg.  FINDINGS Left Ventricle: Left ventricular ejection fraction, by estimation, is 60 to 65%. Left ventricular ejection fraction by 3D volume is 60 %. The left ventricle has normal function. The left ventricle has no regional wall motion abnormalities. Strain imaging was not performed. The left ventricular internal cavity size was normal in size. There is mild left ventricular hypertrophy. Left ventricular diastolic parameters are indeterminate.  Right Ventricle: The right ventricular size is normal. No increase in right ventricular wall thickness. Right ventricular systolic function is normal. There is normal pulmonary artery systolic pressure. The tricuspid regurgitant velocity is 2.64 m/s, and  with an assumed right atrial pressure of 3 mmHg, the estimated right ventricular systolic pressure is 30.9 mmHg.  Left Atrium: Left atrial size was mildly dilated.  Right Atrium: Right atrial size was normal in size.  Pericardium: There is no evidence of pericardial effusion.  Mitral Valve: The mitral valve is normal in structure. Trivial mitral valve regurgitation. No evidence of mitral valve stenosis.  Tricuspid Valve: The tricuspid valve is normal in structure. Tricuspid valve regurgitation is trivial.  Aortic Valve: The aortic valve is tricuspid. Aortic valve regurgitation is not visualized. No aortic stenosis is present. Aortic  valve peak gradient measures 5.5 mmHg.  Pulmonic Valve: The pulmonic valve was not well visualized. Pulmonic valve regurgitation is trivial.  Aorta: The aortic root is normal in size and structure.  Venous: The inferior vena cava is normal in size with greater than 50% respiratory variability, suggesting right atrial pressure of 3 mmHg.  IAS/Shunts: The interatrial septum was not well visualized.  Additional Comments: 3D was performed not requiring image post processing on an independent workstation and was normal.   LEFT VENTRICLE PLAX 2D LVIDd:         5.30 cm         Diastology LVIDs:         3.10 cm         LV e' medial:    6.53 cm/s LV PW:         1.10 cm         LV E/e' medial:  14.3 LV IVS:        0.90 cm         LV e' lateral:   8.38 cm/s LVOT diam:     1.90 cm         LV E/e' lateral: 11.1 LVOT Area:     2.84 cm  3D Volume EF LV 3D EF:    Left ventricul ar ejection fraction by 3D volume is 60 %.  3D Volume EF: 3D EF:        60 % LV EDV:       145 ml LV ESV:       58 ml LV SV:        87 ml  RIGHT VENTRICLE             IVC RV Basal diam:  3.30 cm     IVC diam: 2.00 cm RV Mid diam:    2.10 cm RV S prime:     11.50 cm/s TAPSE (M-mode): 2.4 cm  LEFT ATRIUM              Index        RIGHT ATRIUM           Index LA diam:        4.90 cm  2.05 cm/m   RA Area:     16.60 cm LA Vol (A2C):   103.0 ml 43.04 ml/m  RA Volume:   43.60 ml  18.22 ml/m LA Vol (A4C):   90.5 ml  37.81 ml/m LA Biplane Vol: 97.7 ml  40.82 ml/m AORTIC VALVE AV Area (Vmax): 2.67 cm AV Vmax:        117.00 cm/s AV Peak Grad:   5.5 mmHg LVOT Vmax:      110.00 cm/s  AORTA Ao Root diam: 3.00 cm Ao Asc diam:  3.70 cm  MITRAL VALVE               TRICUSPID VALVE MV Area (  PHT): 4.06 cm    TR Peak grad:   27.9 mmHg MV Decel Time: 187 msec    TR Vmax:        264.00 cm/s MV E velocity: 93.30 cm/s MV A velocity: 73.90 cm/s  SHUNTS MV E/A ratio:  1.26        Systemic Diam: 1.90  cm  Carson Clara MD Electronically signed by Carson Clara MD Signature Date/Time: 07/16/2023/1:05:55 PM    Final          ______________________________________________________________________________________________       Physical Exam:    VS:  BP 126/72   Pulse 77   Ht 5' 6.5" (1.689 m)   Wt 124.7 kg   SpO2 96%   BMI 43.72 kg/m    Wt Readings from Last 3 Encounters:  09/19/23 124.7 kg  08/04/23 130.9 kg  07/27/23 130.5 kg    Gen: no distress, morbid obesity   Neck: No JVD Cardiac: No Rubs or Gallops, systolic murmur, RRR +2 radial pulses Respiratory: Clear to auscultation bilaterally, normal effort, normal   respiratory rate GI: Soft, nontender, non-distended  MS: No  edema;  moves all extremities Integument: Skin feels warm Neuro:  At time of evaluation, alert and oriented to person/place/time/situation  Psych: Normal affect, patient feels well   ASSESSMENT AND PLAN: .    CAD HLD Unstable angina in March 2025, treated with PCI and stent placement. Currently chest pain free on dual antiplatelet therapy. Blood pressure is well controlled at 126/72 mmHg. She is on GLP-1 and PCSK9 inhibitor. Labs to be re-evaluated in July 2025. If back surgery becomes necessary, the goal is to wait at least six months post-PCI to minimize risk. - Continue dual antiplatelet therapy for at least 6 months post-PCI - Re-evaluate labs on November 21, 2023 - Continue current therapy  Morbid obesity DM Morbid obesity complicates cardiovascular care. Weight reduced from 297 lbs to 275 lbs since hospital discharge. Lifestyle interventions discussed, including diet and exercise. She engages in regular exercise and follows a dietary program with occasional indulgences. Weight loss may alleviate some back pain. - Encourage continuation of current exercise regimen - Support dietary program with occasional indulgences - continue current therapy  Mild MR - prominent murmur;  repeat echo in three years unless worsening symptoms  Follow up with me team on stent-anniversary     Gloriann Larger, MD FASE [facility] Cardiologist [facility]  [address] [place], [place] [address] [phone]  10:02 AM

## 2023-09-19 NOTE — Patient Instructions (Signed)
 Medication Instructions:  Your physician recommends that you continue on your current medications as directed. Please refer to the Current Medication list given to you today.  *If you need a refill on your cardiac medications before your next appointment, please call your pharmacy*  Lab Work: NONE  If you have labs (blood work) drawn today and your tests are completely normal, you will receive your results only by: MyChart Message (if you have MyChart) OR A paper copy in the mail If you have any lab test that is abnormal or we need to change your treatment, we will call you to review the results.  Testing/Procedures: NONE  Follow-Up: At [facility], you and your health needs are our priority.  As part of our continuing mission to provide you with exceptional heart care, our providers are all part of one team.  This team includes your primary Cardiologist (physician) and Advanced Practice Providers or APPs (Physician Assistants and Nurse Practitioners) who all work together to provide you with the care you need, when you need it.  Your next appointment:   10 month(s)  Provider:   Charles Connor, NP

## 2023-09-27 DIAGNOSIS — M47816 Spondylosis without myelopathy or radiculopathy, lumbar region: Secondary | ICD-10-CM | POA: Diagnosis not present

## 2023-09-28 NOTE — Progress Notes (Signed)
   09/28/2023  Patient ID: Sandra Lawrence, female   DOB: 06-Sep-1945, 78 y.o.   MRN: [id number]  Pharmacy Quality Measure Review  This patient is appearing on a report for being at risk of failing the adherence measure for diabetes medications this calendar year.   Medication: Glipizide  Last fill date: 07/18/33 for 30 day supply  Medication discontinued and replaced with Mounjaro . No further action needed at this time.   Carnell Christian, PharmD Clinical Pharmacist [phone]

## 2023-09-29 ENCOUNTER — Telehealth: Payer: Self-pay | Admitting: Pharmacist

## 2023-09-29 NOTE — Telephone Encounter (Signed)
 MyChart sent for Mounjaro dose titration.

## 2023-10-04 ENCOUNTER — Other Ambulatory Visit: Payer: Self-pay | Admitting: Cardiovascular Disease

## 2023-10-04 DIAGNOSIS — E119 Type 2 diabetes mellitus without complications: Secondary | ICD-10-CM

## 2023-10-05 ENCOUNTER — Telehealth: Payer: Self-pay | Admitting: Pharmacy Technician

## 2023-10-05 ENCOUNTER — Other Ambulatory Visit: Payer: Self-pay

## 2023-10-05 ENCOUNTER — Other Ambulatory Visit (HOSPITAL_COMMUNITY): Payer: Self-pay

## 2023-10-05 MED ORDER — MOUNJARO 7.5 MG/0.5ML ~~LOC~~ SOAJ
7.5000 mg | SUBCUTANEOUS | 0 refills | Status: DC
  Filled 2023-10-07: qty 2, 28d supply, fill #0

## 2023-10-05 MED ORDER — MOUNJARO 7.5 MG/0.5ML ~~LOC~~ SOAJ
7.5000 mg | SUBCUTANEOUS | 0 refills | Status: DC
  Filled 2023-10-05: qty 2, 28d supply, fill #0

## 2023-10-05 NOTE — Telephone Encounter (Signed)
 Pharmacy Patient Advocate Encounter   Received notification from CoverMyMeds that prior authorization for mounjaro  is required/requested.   Insurance verification completed.   The patient is insured through [facility] .   Per test claim: PA required; PA submitted to above mentioned insurance via CoverMyMeds Key/confirmation #/EOC [id number] Status is pending

## 2023-10-05 NOTE — Addendum Note (Signed)
 Addended by: Jazae Gandolfi K on: 10/05/2023 04:25 PM   Modules accepted: Orders

## 2023-10-06 ENCOUNTER — Other Ambulatory Visit (HOSPITAL_COMMUNITY): Payer: Self-pay

## 2023-10-06 NOTE — Telephone Encounter (Signed)
 Pharmacy Patient Advocate Encounter  Received notification from [facility] that Prior Authorization for mounjaro  has been APPROVED from 10/05/23 to 10/05/26. Unable to obtain price due to refill too soon rejection, last fill date 10/05/23 next available fill date06/11/25   PA #/Case ID/Reference #: [id number]

## 2023-10-07 ENCOUNTER — Other Ambulatory Visit (HOSPITAL_COMMUNITY): Payer: Self-pay

## 2023-10-15 ENCOUNTER — Other Ambulatory Visit (HOSPITAL_COMMUNITY): Payer: Self-pay

## 2023-10-16 ENCOUNTER — Other Ambulatory Visit: Payer: Self-pay

## 2023-10-16 ENCOUNTER — Emergency Department (HOSPITAL_COMMUNITY)
Admission: EM | Admit: 2023-10-16 | Discharge: 2023-10-16 | Disposition: A | Discharge status: Home / Self Care | Attending: Emergency Medicine | Admitting: Emergency Medicine

## 2023-10-16 ENCOUNTER — Encounter (HOSPITAL_COMMUNITY): Payer: Self-pay

## 2023-10-16 ENCOUNTER — Emergency Department (HOSPITAL_COMMUNITY)

## 2023-10-16 DIAGNOSIS — R002 Palpitations: Secondary | ICD-10-CM | POA: Diagnosis not present

## 2023-10-16 DIAGNOSIS — I700 Atherosclerosis of aorta: Secondary | ICD-10-CM | POA: Diagnosis not present

## 2023-10-16 DIAGNOSIS — Z7982 Long term (current) use of aspirin: Secondary | ICD-10-CM | POA: Diagnosis not present

## 2023-10-16 DIAGNOSIS — E876 Hypokalemia: Secondary | ICD-10-CM | POA: Insufficient documentation

## 2023-10-16 LAB — BASIC METABOLIC PANEL WITH GFR
Anion gap: 7 (ref 5–15)
BUN: 23 mg/dL (ref 8–23)
CO2: 26 mmol/L (ref 22–32)
Calcium: 9.3 mg/dL (ref 8.9–10.3)
Chloride: 103 mmol/L (ref 98–111)
Creatinine, Ser: 1.07 mg/dL — ABNORMAL HIGH (ref 0.44–1.00)
GFR, Estimated: 54 mL/min — ABNORMAL LOW (ref >60)
Glucose, Bld: 94 mg/dL (ref 70–99)
Potassium: 3.3 mmol/L — ABNORMAL LOW (ref 3.5–5.1)
Sodium: 136 mmol/L (ref 135–145)

## 2023-10-16 LAB — CBC
HCT: 36.5 % (ref 36.0–46.0)
Hemoglobin: 12.2 g/dL (ref 12.0–15.0)
MCH: 28.9 pg (ref 26.0–34.0)
MCHC: 33.4 g/dL (ref 30.0–36.0)
MCV: 86.5 fL (ref 80.0–100.0)
Platelets: 201 10*3/uL (ref 150–400)
RBC: 4.22 MIL/uL (ref 3.87–5.11)
RDW: 14.7 % (ref 11.5–15.5)
WBC: 4.5 10*3/uL (ref 4.0–10.5)
nRBC: 0 % (ref 0.0–0.2)

## 2023-10-16 LAB — TROPONIN I (HIGH SENSITIVITY): Troponin I (High Sensitivity): 6 ng/L (ref <18)

## 2023-10-16 MED ORDER — POTASSIUM CHLORIDE CRYS ER 20 MEQ PO TBCR
40.0000 meq | EXTENDED_RELEASE_TABLET | Freq: Once | ORAL | Status: AC
  Administered 2023-10-16: 40 meq via ORAL
  Filled 2023-10-16: qty 2

## 2023-10-16 MED ORDER — POTASSIUM CHLORIDE ER 10 MEQ PO TBCR: 10.0000 meq | EXTENDED_RELEASE_TABLET | Freq: Every day | ORAL | 0 refills | Status: DC

## 2023-10-16 NOTE — ED Notes (Signed)
 Main lab contacted to add magnesium  level onto previous collection.

## 2023-10-16 NOTE — ED Notes (Signed)
 Additional call placed to lab to check on status of magnesium  lab. States they got behind and will get in process for us .

## 2023-10-16 NOTE — ED Triage Notes (Signed)
 Pt arrived POV from home c/o an irregular heartbeat/palpitations that have been ongoing all week per her BP machine but she just noticed them yesterday. Pt attempted to take 2 nitros at home before coming. Pt denies any pain.

## 2023-10-16 NOTE — ED Provider Notes (Signed)
 [place] EMERGENCY DEPARTMENT AT [facility] Provider Note   CSN: [id number] Arrival date & time: 10/16/23  0800     History  Chief Complaint  Patient presents with   Palpitations    Sandra Lawrence is a 78 y.o. female with a history of cardiac stent in February 2025 presented to ED with complaint of palpitations.  Patient is she has noticed this with increasing frequency for the past several days.  She has had these feelings in the past.  It is a sensation like her heart skipping.  She denies any chest pain or pressure or difficulty breathing.  She took 2 nitroglycerin  tablets at home but did not improve her symptoms and came into the ED.  She feels much better since arriving, reports only occasional mild skipped heartbeats  HPI     Home Medications Prior to Admission medications   Medication Sig Start Date End Date Taking? Authorizing Provider  potassium chloride  (KLOR-CON ) 10 MEQ tablet Take 1 tablet (10 mEq total) by mouth daily for 15 days. 10/16/23 10/31/23 Yes Dade Rodin, Janalyn Me, MD  acetaminophen  (TYLENOL ) 500 MG tablet Take 2 tablets (1,000 mg total) by mouth 3 (three) times daily as needed for mild pain, moderate pain, fever or headache. 07/24/21   Chadwell, Melodie Spry, PA-C  albuterol  (VENTOLIN  HFA) 108 (90 Base) MCG/ACT inhaler INHALE 1-2 PUFFS BY MOUTH EVERY 6 HOURS AS NEEDED FOR WHEEZE OR SHORTNESS OF BREATH 01/03/23   Watson Hacking, MD  aspirin  EC 81 MG tablet Take 1 tablet (81 mg total) by mouth daily. Swallow whole. 07/20/23   Metta Actis, PA-C  Blood Glucose Monitoring Suppl DEVI 1 each by Does not apply route in the morning and at bedtime. May substitute to any manufacturer covered by patient's insurance. Patient not taking: Reported on 09/19/2023 11/17/22   Roosvelt Colla, MD  calcium  carbonate (TUMS - DOSED IN MG ELEMENTAL CALCIUM ) 500 MG chewable tablet Chew 3-4 tablets by mouth daily.    [provider]  Calcium  Carbonate Antacid (ALKA-SELTZER ANTACID PO)  Take by mouth as needed.    [provider]  carvedilol  (COREG ) 12.5 MG tablet Take 1 tablet (12.5 mg total) by mouth 2 (two) times daily with a meal. 09/12/23   Gerald Kitty., NP  chlorthalidone  (HYGROTON ) 25 MG tablet Take 1 tablet (25 mg total) by mouth daily. 07/20/23   Metta Actis, PA-C  clopidogrel  (PLAVIX ) 75 MG tablet Take 1 tablet (75 mg total) by mouth daily with breakfast. 07/20/23   Metta Actis, PA-C  Evolocumab  (REPATHA  SURECLICK) 140 MG/ML SOAJ Inject 140 mg into the skin every 14 (fourteen) days. 08/31/23   Revankar, Micael Adas, MD  Fluticasone  Furoate (ARNUITY ELLIPTA ) 100 MCG/ACT AEPB Inhale 2 puffs into the lungs daily. Patient not taking: Reported on 09/19/2023 04/20/23   Watson Hacking, MD  glucose blood (ACCU-CHEK GUIDE) test strip Use as instructed Patient not taking: Reported on 09/19/2023 03/10/21   Watson Hacking, MD  Glucose Blood (BLOOD GLUCOSE TEST STRIPS) STRP 1 each by Does not apply route in the morning and at bedtime. Please give the pt. Test strips for [employer] glucose monitor Patient not taking: Reported on 09/19/2023 08/08/23 02/24/24  Watson Hacking, MD  irbesartan  (AVAPRO ) 300 MG tablet Take 1 tablet (300 mg total) by mouth daily. 07/20/23   Metta Actis, PA-C  Lancets [facility] ULTRASOFT) lancets Use as instructed 08/08/23   Lalonde, John C, MD  Multiple Vitamins-Minerals (CENTRUM SILVER PO)  Take 1 capsule by mouth daily.    [provider]  nitroGLYCERIN  (NITROSTAT ) 0.4 MG SL tablet Place 1 tablet (0.4 mg total) under the tongue every 5 (five) minutes as needed for chest pain. 07/19/23   Metta Actis, PA-C  rosuvastatin  (CRESTOR ) 40 MG tablet Take 1 tablet (40 mg total) by mouth daily. 07/20/23   Metta Actis, PA-C  solifenacin  (VESICARE ) 10 MG tablet Take 1 tablet (10 mg total) by mouth daily. 07/27/23   Watson Hacking, MD  spironolactone  (ALDACTONE ) 25 MG tablet Take 0.5 tablets (12.5 mg total) by mouth daily. 07/20/23   Metta Actis, PA-C  tirzepatide  (MOUNJARO ) 7.5 MG/0.5ML Pen Inject 7.5 mg into the skin once a week. 10/05/23   Chandrasekhar, Mahesh A, MD  Vitamin D , Ergocalciferol , (DRISDOL ) 1.25 MG (50000 UNIT) CAPS capsule Take by mouth. 01/24/23   [provider]      Allergies    Amlodipine , Farxiga  [dapagliflozin], Metformin and related, and Ozempic  (0.25 or 0.5 mg-dose) [semaglutide (0.25 or 0.5mg -dos)]    Review of Systems   Review of Systems  Physical Exam Updated Vital Signs BP (!) 145/80   Pulse 71   Temp 98 F (36.7 C)   Resp 18   Ht 5\' 7"  (1.702 m)   Wt 121.6 kg   SpO2 100%   BMI 41.97 kg/m  Physical Exam Constitutional:      General: She is not in acute distress. HENT:     Head: Normocephalic and atraumatic.  Eyes:     Conjunctiva/sclera: Conjunctivae normal.     Pupils: Pupils are equal, round, and reactive to light.  Cardiovascular:     Rate and Rhythm: Normal rate and regular rhythm.  Pulmonary:     Effort: Pulmonary effort is normal. No respiratory distress.  Abdominal:     General: There is no distension.     Tenderness: There is no abdominal tenderness.  Skin:    General: Skin is warm and dry.  Neurological:     General: No focal deficit present.     Mental Status: She is alert. Mental status is at baseline.  Psychiatric:        Mood and Affect: Mood normal.        Behavior: Behavior normal.     ED Results / Procedures / Treatments   Labs (all labs ordered are listed, but only abnormal results are displayed) Labs Reviewed  BASIC METABOLIC PANEL WITH GFR - Abnormal; Notable for the following components:      Result Value   Potassium 3.3 (*)    Creatinine, Ser 1.07 (*)    GFR, Estimated 54 (*)    All other components within normal limits  CBC  MAGNESIUM   TROPONIN I (HIGH SENSITIVITY)    EKG EKG Interpretation Date/Time:  Sunday October 16 2023 08:12:43 EDT Ventricular Rate:  65 PR Interval:  198 QRS Duration:  90 QT Interval:  388 QTC  Calculation: 403 R Axis:   42  Text Interpretation: Sinus rhythm with sinus arrhythmia with occasional Premature ventricular complexes When compared with ECG of 19-Jul-2023 05:56, PREVIOUS ECG IS PRESENT Confirmed by Jerald Molly [phone]) on 10/16/2023 11:04:23 AM  Radiology DG Chest 2 View Result Date: 10/16/2023 CLINICAL DATA:  Palpitations EXAM: CHEST - 2 VIEW COMPARISON:  July 15, 2023 FINDINGS: No focal airspace consolidation, pleural effusion, or pneumothorax. No cardiomegaly. Aortic atherosclerosis. No acute fracture or destructive lesion. Multilevel degenerative disc disease of the spine. IMPRESSION: No acute cardiopulmonary abnormality. Electronically Signed  By: Rance Burrows M.D.   On: 10/16/2023 11:38    Procedures Procedures    Medications Ordered in ED Medications  potassium chloride  SA (KLOR-CON  M) CR tablet 40 mEq (40 mEq Oral Given 10/16/23 1149)    ED Course/ Medical Decision Making/ A&P Clinical Course as of 10/16/23 1522  Sun Oct 16, 2023  1411 After initially being told repeatedly by the lab that the patient's magnesium  would be in process, we are now informed that the magnesium  was discontinued and the specimen could no longer be run.  The patient has been here 6 hours and I do not believe she needs to stay longer for this workup in the ED, she is stable for discharge [MT]    Clinical Course User Index [MT] Rosita Guzzetta, Janalyn Me, MD                                 Medical Decision Making Amount and/or Complexity of Data Reviewed Labs: ordered. Radiology: ordered.  Risk Prescription drug management.   This patient presents to the ED with concern for palpitations; this involves an extensive number of treatment options, and is a complaint that carries with it a high risk of complications and morbidity.  The differential diagnosis includes PVC versus arrhythmia versus other  Co-morbidities that complicate the patient evaluation: Cardiovascular disease and  recent cardiac stenting  External records from outside source obtained and reviewed including left heart cath report from March 2025 noting single drug-eluting stent placed in the mid circumflex artery for 80% stenosis, mild nonobstructing plaque in the proximal and mid LAD.  I ordered and personally interpreted labs.  The pertinent results include: Mild hypokalemia.  Troponin unremarkable.  No other emergent findings  I ordered imaging studies including x-ray of the chest I independently visualized and interpreted imaging which showed no emergent findings I agree with the radiologist interpretation  The patient was maintained on a cardiac monitor.  I personally viewed and interpreted the cardiac monitored which showed an underlying rhythm of: Sinus rhythm, occasional PVC  Per my interpretation the patient's ECG shows sinus rhythm with PVC  I ordered medication including oral potassium for hypokalemia  I have reviewed the patients home medicines and have made adjustments as needed  Test Considered: Low suspicion for acute PE clinically   Social Determinants of Health: patient does not consume caffeine, alcohol, or smoke  Disposition:  After consideration of the diagnostic results and the patient's response to treatment, I feel that the patent would benefit from outpatient follow-up.  I suspect these are likely symptomatic PVCs.  Low suspicion for ACS, PE, other life-threatening condition.         Final Clinical Impression(s) / ED Diagnoses Final diagnoses:  Hypokalemia  Palpitations    Rx / DC Orders ED Discharge Orders          Ordered    potassium chloride  (KLOR-CON ) 10 MEQ tablet  Daily        10/16/23 1412              Arvilla Birmingham, MD 10/16/23 [phone]

## 2023-10-16 NOTE — ED Notes (Signed)
 Third call placed to the lab regarding magnesium  lab. Lab states that the order was cancelled for the specimen being insufficient. No notification made to the primary RN. Pt care delayed. MD made aware of situation.

## 2023-10-17 ENCOUNTER — Other Ambulatory Visit (HOSPITAL_COMMUNITY): Payer: Self-pay

## 2023-10-27 ENCOUNTER — Encounter: Admitting: Family Medicine

## 2023-10-28 NOTE — Progress Notes (Signed)
   10/28/2023  Patient ID: Sandra Lawrence, female   DOB: 12/24/1945, 78 y.o.   MRN: [id number]  Pharmacy Quality Measure Review  This patient is appearing on a report for being at risk of failing the adherence measure for diabetes medications this calendar year.   Medication: Mounjaro  7.5mg  Last fill date: 10/05/23 for 28 day supply  Insurance report was not up to date. No action needed at this time.   Carnell Christian, PharmD Clinical Pharmacist [phone]

## 2023-10-31 ENCOUNTER — Other Ambulatory Visit: Payer: Self-pay | Admitting: Internal Medicine

## 2023-11-02 ENCOUNTER — Encounter (HOSPITAL_BASED_OUTPATIENT_CLINIC_OR_DEPARTMENT_OTHER): Payer: Self-pay

## 2023-11-02 NOTE — Progress Notes (Unsigned)
 Cardiology Office Note    Patient Name: Sandra Lawrence Date of Encounter: 11/02/2023  Primary Care Provider:  Watson Hacking, MD Primary Cardiologist:  Sandra Melody, MD Primary Electrophysiologist: None   Past Medical History    Past Medical History:  Diagnosis Date   Anemia    Anxiety    Asthma    has inhalers   CAD (coronary artery disease) 02/15/2007   Cataract associated with type 2 diabetes mellitus (HCC) 07/21/2015   DDD (degenerative disc disease), lumbar 02/04/2016   Depression    Diabetes mellitus (HCC)    diet controlled- NO MEDS   Diverticulosis    DJD (degenerative joint disease) 03/17/2011   Essential hypertension 05/27/2021   GERD (gastroesophageal reflux disease)    History of colonic polyps 09/05/2012   Tubular adenoma   History of kidney stones    Hypercholesteremia    Morbid obesity with BMI of 40.0-44.9, adult (HCC) 03/17/2011   OAB (overactive bladder) 11/04/2016   Osteoporosis 06/01/2021   Sleep apnea    No cpap does not tolerate   Tubular adenoma of colon     History of Present Illness   Sandra Lawrence is a 78 y.o. female with a PMH of CAD s/p LHC with PCI/DES to mid circumflex stenosis mild nonobstructive plaquing in proximal and mid LAD, HTN, HLD DM type II, OSA (intolerant to CPAP) who presents today for 89-month follow-up.  [name] was seen in follow-up after PCI in March and blood pressure was stable.  She reported no chest pain clear to participate in cardiac rehab.  She was seen by Dr. Annabelle Lawrence on 09/19/2023 for transition to new cardiology care.  During her visit she continued to do well and was exercising daily after completing cardiac rehab.  She was seen in the ED on 10/16/2023 with complaint of palpitations.  She endorsed a sensation of skipping but denied any chest pain.  She took 2 nitroglycerin  with no improvement of symptoms.  She had negative troponins with mild hypokalemia with telemetry showing sinus rhythm with  occasional PVCs.  Patient denies chest pain, palpitations, dyspnea, PND, orthopnea, nausea, vomiting, dizziness, syncope, edema, weight gain, or early satiety.   Discussed the use of AI scribe software for clinical note transcription with the patient, who gave verbal consent to proceed.  History of Present Illness    ***Notes:   Review of Systems  Please see the history of present illness.    All other systems reviewed and are otherwise negative except as noted above.  Physical Exam    Wt Readings from Last 3 Encounters:  10/16/23 268 lb (121.6 kg)  09/19/23 275 lb (124.7 kg)  08/04/23 288 lb 9.6 oz (130.9 kg)   NW:GNFAO were no vitals filed for this visit.,There is no height or weight on file to calculate BMI. GEN: Well nourished, well developed in no acute distress Neck: No JVD; No carotid bruits Pulmonary: Clear to auscultation without rales, wheezing or rhonchi  Cardiovascular: Normal rate. Regular rhythm. Normal S1. Normal S2.   Murmurs: There is no murmur.  ABDOMEN: Soft, non-tender, non-distended EXTREMITIES:  No edema; No deformity   EKG/LABS/ Recent Cardiac Studies   ECG personally reviewed by me today - ***  Risk Assessment/Calculations:   {Does this patient have ATRIAL FIBRILLATION?[phone]}      Lab Results  Component Value Date   WBC 4.5 10/16/2023   HGB 12.2 10/16/2023   HCT 36.5 10/16/2023   MCV 86.5 10/16/2023   PLT 201  10/16/2023   Lab Results  Component Value Date   CREATININE 1.07 (H) 10/16/2023   BUN 23 10/16/2023   NA 136 10/16/2023   K 3.3 (L) 10/16/2023   CL 103 10/16/2023   CO2 26 10/16/2023   Lab Results  Component Value Date   CHOL 143 07/16/2023   HDL 53 07/16/2023   LDLCALC 80 07/16/2023   TRIG 51 07/16/2023   CHOLHDL 2.7 07/16/2023    Lab Results  Component Value Date   HGBA1C 7.2 (H) 04/20/2023   Assessment & Plan    Assessment and Plan Assessment & Plan     1.  Palpitations  2.CAD: -s/p LHC completed  07/2023 showing 80% circumflex lesion treated with PCI/DES x 1 with 50% mid LAD lesion and 30% distal LAD lesion treated medically  3.  Essential hypertension  4.  Hyperlipidemia  5.  DM type II  6.  History of obesity      Disposition: Follow-up with Sandra Melody, MD or APP in *** months {Are you ordering a CV Procedure (e.g. stress test, cath, DCCV, TEE, etc)?   Press F2        :[id number]}   Signed, Francene Ing, Retha Cast, NP 11/02/2023, 10:46 AM  [facility]

## 2023-11-03 ENCOUNTER — Other Ambulatory Visit: Payer: Self-pay

## 2023-11-03 ENCOUNTER — Encounter: Payer: Self-pay | Admitting: Nurse Practitioner

## 2023-11-03 ENCOUNTER — Other Ambulatory Visit (HOSPITAL_COMMUNITY): Payer: Self-pay

## 2023-11-03 ENCOUNTER — Ambulatory Visit: Attending: Nurse Practitioner | Admitting: Nurse Practitioner

## 2023-11-03 VITALS — BP 122/60 | HR 73 | Ht 67.0 in | Wt 269.0 lb

## 2023-11-03 DIAGNOSIS — Z6841 Body Mass Index (BMI) 40.0 and over, adult: Secondary | ICD-10-CM

## 2023-11-03 DIAGNOSIS — Z794 Long term (current) use of insulin: Secondary | ICD-10-CM

## 2023-11-03 DIAGNOSIS — E785 Hyperlipidemia, unspecified: Secondary | ICD-10-CM

## 2023-11-03 DIAGNOSIS — I10 Essential (primary) hypertension: Secondary | ICD-10-CM

## 2023-11-03 DIAGNOSIS — E119 Type 2 diabetes mellitus without complications: Secondary | ICD-10-CM

## 2023-11-03 DIAGNOSIS — I2510 Atherosclerotic heart disease of native coronary artery without angina pectoris: Secondary | ICD-10-CM

## 2023-11-03 DIAGNOSIS — R002 Palpitations: Secondary | ICD-10-CM | POA: Diagnosis not present

## 2023-11-03 LAB — CBC

## 2023-11-03 MED ORDER — CLOPIDOGREL BISULFATE 75 MG PO TABS
75.0000 mg | ORAL_TABLET | Freq: Every day | ORAL | 3 refills | Status: AC
  Filled 2023-11-03 – 2023-11-11 (×3): qty 90, 90d supply, fill #0
  Filled 2024-02-07: qty 90, 90d supply, fill #1
  Filled 2024-04-02 – 2024-05-18 (×3): qty 90, 90d supply, fill #2

## 2023-11-03 MED ORDER — MOUNJARO 7.5 MG/0.5ML ~~LOC~~ SOAJ
7.5000 mg | SUBCUTANEOUS | 0 refills | Status: DC
  Filled 2023-11-03: qty 2, 28d supply, fill #0

## 2023-11-03 MED ORDER — SPIRONOLACTONE 25 MG PO TABS
12.5000 mg | ORAL_TABLET | Freq: Every day | ORAL | 3 refills | Status: DC
  Filled 2023-11-03 – 2023-11-08 (×2): qty 90, 180d supply, fill #0
  Filled 2023-11-11: qty 45, 90d supply, fill #0
  Filled 2024-01-05: qty 45, 90d supply, fill #1

## 2023-11-03 NOTE — Patient Instructions (Addendum)
 Medication Instructions:  Your physician recommends that you continue on your current medications as directed. Please refer to the Current Medication list given to you today. *If you need a refill on your cardiac medications before your next appointment, please call your pharmacy*  Lab Work: TODAY-BMET, CBC & MAG If you have labs (blood work) drawn today and your tests are completely normal, you will receive your results only by: MyChart Message (if you have MyChart) OR A paper copy in the mail If you have any lab test that is abnormal or we need to change your treatment, we will call you to review the results.  Testing/Procedures: NONE ORDERED  Follow-Up: At [facility], you and your health needs are our priority.  As part of our continuing mission to provide you with exceptional heart care, our providers are all part of one team.  This team includes your primary Cardiologist (physician) and Advanced Practice Providers or APPs (Physician Assistants and Nurse Practitioners) who all work together to provide you with the care you need, when you need it.  Your next appointment:   9 month(s) [facility] 2026)  Provider:   Jann Melody, MD    NEEDS APPT WITH PHARM-D   We recommend signing up for the patient portal called MyChart.  Sign up information is provided on this After Visit Summary.  MyChart is used to connect with patients for Virtual Visits (Telemedicine).  Patients are able to view lab/test results, encounter notes, upcoming appointments, etc.  Non-urgent messages can be sent to your provider as well.   To learn more about what you can do with MyChart, go to [email or web address].   Other Instructions Increase your potassium intake.  There is plenty of potassium in: apricots and dried fruit tree fruits -- such as avocados, apples, oranges and bananas leafy greens -- such as spinach, kale and silverbeet vine fruits -- such as tomatoes, cucumbers, zucchini,  eggplant and pumpkin root vegetables -- such as carrots, potatoes and sweet potatoes legumes -- such as beans and peas milk, yoghurt, meat and chicken, as well as fish -- such as halibut, tuna, cod, snapper Illustration of foods that are high in potassium; apricots and dried fruit, tree fruits, leafy greens, vine fruits, root vegetables, legumes, dairy and protein.

## 2023-11-04 ENCOUNTER — Ambulatory Visit: Payer: Self-pay | Admitting: Nurse Practitioner

## 2023-11-04 LAB — CBC
Hematocrit: 37.1 % (ref 34.0–46.6)
Hemoglobin: 11.7 g/dL (ref 11.1–15.9)
MCH: 28.7 pg (ref 26.6–33.0)
MCHC: 31.5 g/dL (ref 31.5–35.7)
MCV: 91 fL (ref 79–97)
Platelets: 247 10*3/uL (ref 150–450)
RBC: 4.07 x10E6/uL (ref 3.77–5.28)
RDW: 14.1 % (ref 11.7–15.4)
WBC: 5.2 10*3/uL (ref 3.4–10.8)

## 2023-11-04 LAB — BASIC METABOLIC PANEL WITH GFR
BUN/Creatinine Ratio: 19 (ref 12–28)
BUN: 20 mg/dL (ref 8–27)
CO2: 22 mmol/L (ref 20–29)
Calcium: 9.9 mg/dL (ref 8.7–10.3)
Chloride: 102 mmol/L (ref 96–106)
Creatinine, Ser: 1.07 mg/dL — ABNORMAL HIGH (ref 0.57–1.00)
Glucose: 88 mg/dL (ref 70–99)
Potassium: 3.9 mmol/L (ref 3.5–5.2)
Sodium: 139 mmol/L (ref 134–144)
eGFR: 54 mL/min/{1.73_m2} — ABNORMAL LOW (ref >59)

## 2023-11-04 LAB — MAGNESIUM: Magnesium: 2.4 mg/dL — ABNORMAL HIGH (ref 1.6–2.3)

## 2023-11-08 ENCOUNTER — Other Ambulatory Visit (HOSPITAL_COMMUNITY): Payer: Self-pay

## 2023-11-08 NOTE — Progress Notes (Signed)
   11/08/2023  Patient ID: Sandra Lawrence, female   DOB: 04-13-1946, 78 y.o.   MRN: [id number]  Pharmacy Quality Measure Review  This patient is appearing on a report for being at risk of failing the adherence measure for diabetes medications this calendar year.   Medication: Mounjaro  7.5mg  Last fill date: 11/04/23 for 28 day supply  Insurance report was not up to date. No action needed at this time.   Jon VEAR Lindau, PharmD Clinical Pharmacist [phone]

## 2023-11-11 ENCOUNTER — Other Ambulatory Visit (HOSPITAL_COMMUNITY): Payer: Self-pay

## 2023-11-16 ENCOUNTER — Other Ambulatory Visit: Payer: Self-pay | Admitting: Family Medicine

## 2023-11-16 ENCOUNTER — Other Ambulatory Visit (HOSPITAL_COMMUNITY): Payer: Self-pay

## 2023-11-16 ENCOUNTER — Other Ambulatory Visit: Payer: Self-pay | Admitting: Nurse Practitioner

## 2023-11-16 DIAGNOSIS — N3281 Overactive bladder: Secondary | ICD-10-CM

## 2023-11-16 MED ORDER — SOLIFENACIN SUCCINATE 10 MG PO TABS
10.0000 mg | ORAL_TABLET | Freq: Every day | ORAL | 0 refills | Status: DC
  Filled 2023-11-16: qty 90, 90d supply, fill #0

## 2023-11-16 MED ORDER — CARVEDILOL 12.5 MG PO TABS
12.5000 mg | ORAL_TABLET | Freq: Two times a day (BID) | ORAL | 3 refills | Status: AC
  Filled 2023-11-16 – 2024-01-05 (×3): qty 180, 90d supply, fill #0
  Filled 2024-04-02: qty 180, 90d supply, fill #1

## 2023-11-21 DIAGNOSIS — E119 Type 2 diabetes mellitus without complications: Secondary | ICD-10-CM | POA: Diagnosis not present

## 2023-11-21 DIAGNOSIS — Z6841 Body Mass Index (BMI) 40.0 and over, adult: Secondary | ICD-10-CM | POA: Diagnosis not present

## 2023-11-21 DIAGNOSIS — Z794 Long term (current) use of insulin: Secondary | ICD-10-CM | POA: Diagnosis not present

## 2023-11-21 DIAGNOSIS — I10 Essential (primary) hypertension: Secondary | ICD-10-CM | POA: Diagnosis not present

## 2023-11-21 DIAGNOSIS — E785 Hyperlipidemia, unspecified: Secondary | ICD-10-CM | POA: Diagnosis not present

## 2023-11-21 DIAGNOSIS — I2510 Atherosclerotic heart disease of native coronary artery without angina pectoris: Secondary | ICD-10-CM | POA: Diagnosis not present

## 2023-11-22 ENCOUNTER — Ambulatory Visit: Payer: Self-pay | Admitting: Nurse Practitioner

## 2023-11-22 LAB — LIPID PANEL
Chol/HDL Ratio: 1.7 ratio (ref 0.0–4.4)
Cholesterol, Total: 56 mg/dL — ABNORMAL LOW (ref 100–199)
HDL: 33 mg/dL — ABNORMAL LOW (ref >39)
LDL Chol Calc (NIH): 7 mg/dL (ref 0–99)
Triglycerides: 66 mg/dL (ref 0–149)
VLDL Cholesterol Cal: 16 mg/dL (ref 5–40)

## 2023-11-22 LAB — HEPATIC FUNCTION PANEL
ALT: 19 IU/L (ref 0–32)
AST: 17 IU/L (ref 0–40)
Albumin: 3.8 g/dL (ref 3.8–4.8)
Alkaline Phosphatase: 95 IU/L (ref 44–121)
Bilirubin Total: 0.4 mg/dL (ref 0.0–1.2)
Bilirubin, Direct: 0.21 mg/dL (ref 0.00–0.40)
Total Protein: 6.5 g/dL (ref 6.0–8.5)

## 2023-12-05 DIAGNOSIS — M47816 Spondylosis without myelopathy or radiculopathy, lumbar region: Secondary | ICD-10-CM | POA: Diagnosis not present

## 2023-12-16 ENCOUNTER — Ambulatory Visit: Attending: Cardiology | Admitting: Pharmacist

## 2023-12-16 ENCOUNTER — Other Ambulatory Visit (HOSPITAL_COMMUNITY): Payer: Self-pay

## 2023-12-16 ENCOUNTER — Encounter: Payer: Self-pay | Admitting: Pharmacist

## 2023-12-16 VITALS — Ht 67.5 in | Wt 265.0 lb

## 2023-12-16 DIAGNOSIS — Z794 Long term (current) use of insulin: Secondary | ICD-10-CM | POA: Diagnosis not present

## 2023-12-16 DIAGNOSIS — E119 Type 2 diabetes mellitus without complications: Secondary | ICD-10-CM

## 2023-12-16 MED ORDER — MOUNJARO 15 MG/0.5ML ~~LOC~~ SOAJ
15.0000 mg | SUBCUTANEOUS | 0 refills | Status: DC
  Filled 2023-12-16 – 2024-02-07 (×2): qty 2, 28d supply, fill #0

## 2023-12-16 MED ORDER — MOUNJARO 10 MG/0.5ML ~~LOC~~ SOAJ
10.0000 mg | SUBCUTANEOUS | 0 refills | Status: DC
  Filled 2023-12-16: qty 2, 28d supply, fill #0

## 2023-12-16 MED ORDER — MOUNJARO 12.5 MG/0.5ML ~~LOC~~ SOAJ
12.5000 mg | SUBCUTANEOUS | 0 refills | Status: DC
  Filled 2023-12-16 – 2024-01-05 (×2): qty 2, 28d supply, fill #0

## 2023-12-16 NOTE — Progress Notes (Signed)
 Patient ID: Sandra Lawrence                 DOB: 04/27/1946                    MRN: [id number]     HPI: Sandra Lawrence is a 78 y.o. female patient referred to pharmacy clinic by Jackee Alberts, NP to initiate GLP1-RA therapy. PMH is significant for CAD s/p LHC with PCI/DES to mid circumflex stenosis mild nonobstructive plaquing in proximal and mid LAD, HTN, HLD DM type II, OSA (intolerant to CPAP) , and obesity. Most recent BMI 42.12 kg/m .  Baseline weight and BMI: 287 lbs 45.03 kg/m  Current weight and BMI: 269 lbs 42.12 kg/m   The patient has lost approximately 7.6% of her body weight while taking Mounjaro  7.5 mg, which she is tolerating well without any side effects. She maintains a healthy diet and engages in chair exercises as well as leg stretches while in bed. Her chronic back pain currently limits her ability to walk. She is scheduled to receive a steroid injection in a few days, with the hope that it will relieve her back pain and enable her to start walking longer distances.   Family History:  Relation Problem Comments  Mother (Deceased at age 64) Diabetes     Father (Deceased at age 20) Cancer     Social History:  Alcohol: none  Smoking: none  Labs: Lab Results  Component Value Date   HGBA1C 7.2 (H) 04/20/2023    Wt Readings from Last 1 Encounters:  11/03/23 269 lb (122 kg)    BP Readings from Last 1 Encounters:  11/03/23 122/60   Pulse Readings from Last 1 Encounters:  11/03/23 73       Component Value Date/Time   CHOL 56 (L) 11/21/2023 1035   TRIG 66 11/21/2023 1035   HDL 33 (L) 11/21/2023 1035   CHOLHDL 1.7 11/21/2023 1035   CHOLHDL 2.7 07/16/2023 0418   VLDL 10 07/16/2023 0418   LDLCALC 7 11/21/2023 1035    Past Medical History:  Diagnosis Date   Anemia    Anxiety    Asthma    has inhalers   CAD (coronary artery disease) 02/15/2007   Cataract associated with type 2 diabetes mellitus (HCC) 07/21/2015   DDD (degenerative disc disease), lumbar  02/04/2016   Depression    Diabetes mellitus (HCC)    diet controlled- NO MEDS   Diverticulosis    DJD (degenerative joint disease) 03/17/2011   Essential hypertension 05/27/2021   GERD (gastroesophageal reflux disease)    History of colonic polyps 09/05/2012   Tubular adenoma   History of kidney stones    Hypercholesteremia    Morbid obesity with BMI of 40.0-44.9, adult (HCC) 03/17/2011   OAB (overactive bladder) 11/04/2016   Osteoporosis 06/01/2021   Sleep apnea    No cpap does not tolerate   Tubular adenoma of colon     Current Outpatient Medications on File Prior to Visit  Medication Sig Dispense Refill   acetaminophen  (TYLENOL ) 500 MG tablet Take 2 tablets (1,000 mg total) by mouth 3 (three) times daily as needed for mild pain, moderate pain, fever or headache. 30 tablet 0   albuterol  (VENTOLIN  HFA) 108 (90 Base) MCG/ACT inhaler INHALE 1-2 PUFFS BY MOUTH EVERY 6 HOURS AS NEEDED FOR WHEEZE OR SHORTNESS OF BREATH 8.5 each 2   aspirin  EC 81 MG tablet Take 1 tablet (81 mg total) by mouth daily. Swallow  whole. 30 tablet 12   Blood Glucose Monitoring Suppl DEVI 1 each by Does not apply route in the morning and at bedtime. May substitute to any manufacturer covered by patient's insurance. 1 each 0   calcium  carbonate (TUMS - DOSED IN MG ELEMENTAL CALCIUM ) 500 MG chewable tablet Chew 3-4 tablets by mouth daily.     Calcium  Carbonate Antacid (ALKA-SELTZER ANTACID PO) Take by mouth as needed.     carvedilol  (COREG ) 12.5 MG tablet Take 1 tablet (12.5 mg total) by mouth 2 (two) times daily with a meal. 180 tablet 3   chlorthalidone  (HYGROTON ) 25 MG tablet Take 1 tablet (25 mg total) by mouth daily. 90 tablet 2   clopidogrel  (PLAVIX ) 75 MG tablet Take 1 tablet (75 mg total) by mouth daily with breakfast. 90 tablet 3   Evolocumab  (REPATHA  SURECLICK) 140 MG/ML SOAJ Inject 140 mg into the skin every 14 (fourteen) days. 6 mL 3   Fluticasone  Furoate (ARNUITY ELLIPTA ) 100 MCG/ACT AEPB Inhale 2  puffs into the lungs daily. 90 each 5   glucose blood (ACCU-CHEK GUIDE) test strip Use as instructed 100 each 12   Glucose Blood (BLOOD GLUCOSE TEST STRIPS) STRP 1 each by Does not apply route in the morning and at bedtime. Please give the pt. Test strips for Prodigy glucose monitor 200 each 1   irbesartan  (AVAPRO ) 300 MG tablet Take 1 tablet (300 mg total) by mouth daily. 90 tablet 1   Lancets (ONETOUCH ULTRASOFT) lancets Use as instructed 100 each 2   Multiple Vitamins-Minerals (CENTRUM SILVER PO) Take 1 capsule by mouth daily.     nitroGLYCERIN  (NITROSTAT ) 0.4 MG SL tablet Place 1 tablet (0.4 mg total) under the tongue every 5 (five) minutes as needed for chest pain. 28 tablet 0   potassium chloride  (KLOR-CON ) 10 MEQ tablet Take 1 tablet (10 mEq total) by mouth daily for 15 days. (Patient not taking: Reported on 11/03/2023) 15 tablet 0   rosuvastatin  (CRESTOR ) 40 MG tablet Take 1 tablet (40 mg total) by mouth daily. 90 tablet 3   solifenacin  (VESICARE ) 10 MG tablet Take 1 tablet (10 mg total) by mouth daily. 90 tablet 0   spironolactone  (ALDACTONE ) 25 MG tablet Take 0.5 tablets (12.5 mg total) by mouth daily. 90 tablet 3   tirzepatide  (MOUNJARO ) 7.5 MG/0.5ML Pen Inject 7.5 mg into the skin once a week. 2 mL 0   Vitamin D , Ergocalciferol , (DRISDOL ) 1.25 MG (50000 UNIT) CAPS capsule Take by mouth.     No current facility-administered medications on file prior to visit.    Allergies  Allergen Reactions   Amlodipine      Gum issues   Farxiga  [Dapagliflozin]     Yeast infection   Metformin And Related     GI upset   Ozempic  (0.25 Or 0.5 Mg-Dose) [Semaglutide (0.25 Or 0.5mg -Dos)] Other (See Comments)    Acid reflux     Assessment/Plan:  1. Weight loss - The patient has lost approximately 7.6% of her body weight while taking Mounjaro  7.5 mg, which she is tolerating well without any side effects. She maintains a healthy diet and engages in chair exercises as well as leg stretches while in  bed. Her chronic back pain currently limits her ability to walk. She is scheduled to receive a steroid injection in a few days, with the hope that it will relieve her back pain and enable her to start walking longer distances.  Advised patient on common side effects including nausea, diarrhea, dyspepsia, decreased appetite, and fatigue.  Counseled patient on reducing meal size and how to titrate medication to minimize side effects. Counseled patient to call if intolerable side effects or if experiencing dehydration, abdominal pain, or dizziness. Along with pharmacotherapy, the patient will follow dietary modifications and aim for at least 150 minutes of moderate-intensity exercise per week, plus resistance training twice a week (as recommended by the American Heart Association). This resistance training--such as weightlifting, bodyweight exercises, or using resistance bands, adapted to the patient's ability--will help prevent muscle loss.  Prescriptions for 10 mg, 12.5 mg and 15 mg Mounjaro  sent to preferred pharmacy.pt to contact me if unable to tolerate any of the prescribed doses. Will f/u via phone in 3 months    Robbi Blanch, [place].D Reyno Elspeth BIRCH. [facility] [address], [place], [place] [address] Phone: [phone]; Fax: [phone]

## 2023-12-21 DIAGNOSIS — M47816 Spondylosis without myelopathy or radiculopathy, lumbar region: Secondary | ICD-10-CM | POA: Diagnosis not present

## 2024-01-05 ENCOUNTER — Other Ambulatory Visit: Payer: Self-pay

## 2024-01-05 ENCOUNTER — Other Ambulatory Visit (HOSPITAL_COMMUNITY): Payer: Self-pay

## 2024-01-05 MED ORDER — SPIRONOLACTONE 25 MG PO TABS
25.0000 mg | ORAL_TABLET | Freq: Every day | ORAL | 3 refills | Status: AC
  Filled 2024-01-05 (×2): qty 90, 90d supply, fill #0
  Filled 2024-04-02: qty 90, 90d supply, fill #1

## 2024-01-05 NOTE — Telephone Encounter (Signed)
 This patient has appeared in a report as failing or at risk of failing a medication adherence for diabetes measure in 2025 fiscal year.   PDC on report: 0.81  Date of initial outreach: 11/08/2023  Weeks since initial outreach: Week 8  Was patient enrolled in a pharmacy service following previous encounter?  No  NEW barriers to adherence identified:  Does the patient have cost concerns?: No Does the patient have transportation concerns?: No Does the patient need assistance obtaining refills?: No Does the patient occasionally forget to take some of their prescribed medications?:No Does the patient feel like one/some of their medications make them feel poorly?: No Does the patient have questions or concerns about their medications?: No   Clinical intervention indicated? Yes  []  Pharmacotherapy dose escalation  [] Pharmacotherapy dose decrease [] Initiation of new pharmacotherapy  [] Pharmacotherapy discontinued  [x] Other: Patient reported increased dose of spironolactone  to 1 tablet daily but no new prescription on file.   Comments: Patient requested spironolactone  fill; no refill on file for new dose and insurance considers too soon to fill.    CPP or PCP notified? Yes  Clinic-embedded pharmacist: Robbi Blanch (Cardiology) - Confirmed patient no longer taking Potassium; new Rx sent in to [facility] pharmacy.  PCP: Jackee Alberts, NP (Cardiology)

## 2024-01-07 ENCOUNTER — Other Ambulatory Visit (HOSPITAL_COMMUNITY): Payer: Self-pay

## 2024-01-08 ENCOUNTER — Encounter (HOSPITAL_COMMUNITY): Payer: Self-pay

## 2024-01-09 ENCOUNTER — Other Ambulatory Visit: Payer: Self-pay | Admitting: Physician Assistant

## 2024-01-09 ENCOUNTER — Other Ambulatory Visit (HOSPITAL_COMMUNITY): Payer: Self-pay

## 2024-01-11 ENCOUNTER — Other Ambulatory Visit (HOSPITAL_COMMUNITY): Payer: Self-pay

## 2024-01-11 MED ORDER — IRBESARTAN 300 MG PO TABS
300.0000 mg | ORAL_TABLET | Freq: Every day | ORAL | 2 refills | Status: AC
  Filled 2024-01-11: qty 90, 90d supply, fill #0
  Filled 2024-04-02: qty 90, 90d supply, fill #1

## 2024-01-12 DIAGNOSIS — M47816 Spondylosis without myelopathy or radiculopathy, lumbar region: Secondary | ICD-10-CM | POA: Diagnosis not present

## 2024-01-18 ENCOUNTER — Other Ambulatory Visit (HOSPITAL_COMMUNITY): Payer: Self-pay

## 2024-02-01 ENCOUNTER — Other Ambulatory Visit (HOSPITAL_COMMUNITY): Payer: Self-pay

## 2024-02-01 ENCOUNTER — Other Ambulatory Visit: Payer: Self-pay | Admitting: Internal Medicine

## 2024-02-01 DIAGNOSIS — Z79899 Other long term (current) drug therapy: Secondary | ICD-10-CM

## 2024-02-01 DIAGNOSIS — R002 Palpitations: Secondary | ICD-10-CM

## 2024-02-01 NOTE — Telephone Encounter (Signed)
   Patient said she is leaving out of state today and need her potassium because she's been feeling dizzy. She is asking if she can at least get 4 pills today while she's out of town

## 2024-02-01 NOTE — Telephone Encounter (Signed)
 Called and spoke to patient . She is aware that she will need BMPand results before prescription can be refilled.   Patient states she will not be able to  do it now she is going out of town. She states she will do it when she comes back into town.  RN  gave instructions- non fasting , go to any [employer] .  Office will contact her of the results

## 2024-02-01 NOTE — Telephone Encounter (Signed)
 02/01/24 10:16 AM Please have patient update BMET and if potassium is abnormal she can continue supplement.  Please do not refill until after labs are completed.   Thanks, Jackee Alberts, NP

## 2024-02-01 NOTE — Telephone Encounter (Signed)
 Pt is requesting a refill on medication potassium. This medication was prescribed in the hospital by another provider. Would Dr. Santo like to refill this medication? Please address

## 2024-02-01 NOTE — Telephone Encounter (Signed)
*  STAT* If patient is at the pharmacy, call can be transferred to refill team.   1. Which medications need to be refilled? (please list name of each medication and dose if known) potassium chloride  (KLOR-CON ) 10 MEQ tablet (Expired)   2. Which pharmacy/location (including street and city if local pharmacy) is medication to be sent to?  [place] [facility]    3. Do they need a 30 day or 90 day supply? 90

## 2024-02-07 ENCOUNTER — Other Ambulatory Visit: Payer: Self-pay

## 2024-02-07 ENCOUNTER — Other Ambulatory Visit (HOSPITAL_COMMUNITY): Payer: Self-pay

## 2024-02-07 ENCOUNTER — Other Ambulatory Visit: Payer: Self-pay | Admitting: Family Medicine

## 2024-02-07 ENCOUNTER — Telehealth: Payer: Self-pay | Admitting: Internal Medicine

## 2024-02-07 DIAGNOSIS — N3281 Overactive bladder: Secondary | ICD-10-CM

## 2024-02-07 NOTE — Telephone Encounter (Signed)
 Inbound call from patient stating that she has not been able to move her bowels in weeks and if she has its been very hard. Patient is requesting a call back to discuss. Please advise.

## 2024-02-07 NOTE — Telephone Encounter (Signed)
 Pt states she needs to be seen for constipation.Pt scheduled to see Alan Coombs 02/08/24@8 :40am. Pt aware of appt.

## 2024-02-07 NOTE — Progress Notes (Unsigned)
 02/08/2024 Sandra Lawrence [id number] 1946/01/15  Referring provider: Joyce Norleen BROCKS, MD Primary GI doctor: Dr. Albertus  ASSESSMENT AND PLAN:  Constipation x years worsening last several months Will normally takes 2 dulcolax with a BM, however this did not help, so she took castor oil and enemas with manual disimpaction.  Colonoscopy 01/2023 multiple polyps diverticulosis IH recall 3 years 11/21/2023 CBC without anemia, normal kidney liver, last thyroid  11/2022 normal, No recent imaging Patient is on Mounjaro  x 2-3 months, Vesicare , chlorthalidone , spironolactone  Worsening constipation likely secondary to addition of GLP1 and decreased movement from back pain resulting in disimpaction yesterday.  - Will send in Trilyte - please take 6 oz every 30 mins until the impaction passes, can do 1/2 gallon on first day and 1/2 gallon on 2nd day.  Please do 2 fleets enemas a day until it resolves.  ER precautions given to the patient.  - start on linzess  145 mcg daily, samples given and script sent in, if this is not helpful consider increasing to 290 versus amitiza/ibsrella - Increase water  intake, decrease caffeine, increase activity level. - close follow up 2 months  Personal history of colon polyps 01/27/2023 colonoscopy difficult due to redundant colon, good bowel prep 2 polyps were 6 mm ascending 5 polyps 4 to 9 mm transverse colon, 2 polyps 4 to 5 mm descending mild diverticulosis and small internal hemorrhoid. All polyps are tubular adenomatous polyps recall 3 years  CAD status post DES stent 07/18/2023 07/16/2023 echo ejection fraction 60 to 65% unremarkable valves 07/18/2023 severe mid circumflex stenosis status post PCI normal ejection fraction aspirin  and clopidogrel  x 12 months Monitor for blood in stool, black stool Continue GLP 1 for DM and secondary cardiovascular prevention, counseled on AE from GLP1 and how to avoid them  GERD -Lifestyle changes discussed, avoid NSAIDS, ETOH, hand out  given to the patient - gastroparesis diet given due to GLP1 use  Type 2 diabetes with hyperlipidemia On mounjaro  likely contributing to worsening constipation  Morbid obesity  Body mass index is 39.35 kg/m.  -Patient has been advised to make an attempt to improve diet and exercise patterns to aid in weight loss. -Recommended diet heavy in fruits and veggies and low in animal meats, cheeses, and dairy products, appropriate calorie intake  Patient Care Team: Joyce Norleen BROCKS, MD as PCP - General (Family Medicine) Santo Stanly LABOR, MD as PCP - Cardiology (Cardiology) Shari Sieving, MD as Consulting Physician (Orthopedic Surgery)  HISTORY OF PRESENT ILLNESS: 78 y.o. female with a past medical history listed below presents for evaluation of constipation.   Patient last seen in the [facility] by Dr. Albertus on 01/27/2023 for colonoscopy for personal history of colon polyps.  Discussed the use of AI scribe software for clinical note transcription with the patient, who gave verbal consent to proceed.  History of Present Illness   Sandra Lawrence is a 78 year old female with a history of constipation and redundant colon who presents with worsening constipation.  She has experienced constipation for years, which has recently worsened. Her usual laxatives, including Dulcolax and castor oil, have been ineffective. She has resorted to using three to four enemas and manual disimpaction to relieve her symptoms, which has left her feeling exhausted. She has blood in her stool due to straining and manual disimpaction. No ongoing rectal pain, burning, or itching.  She has a history of a colonoscopy in September 2024, which revealed polyps, hemorrhoids, diverticulosis, and a redundant colon. She is due  for another colonoscopy in 2027.  Her current medications include Mounjaro , which she started two to three months ago, Vesicare , chlorthalidone , and spironolactone . She experiences occasional heartburn,  which she attributes to Mounjaro .  She uses a wheelchair but typically is able to move around, although her activity level has decreased this year. She recently had a mirror block for back pain about three to four weeks ago.      She  reports that she quit smoking about 44 years ago. Her smoking use included cigarettes. She has never used smokeless tobacco. She reports that she does not currently use alcohol. She reports that she does not use drugs.  RELEVANT GI HISTORY, IMAGING AND LABS: Results   DIAGNOSTIC Colonoscopy: Polyps removed, hemorrhoids, diverticulosis, redundant colon (01/2023)      CBC    Component Value Date/Time   WBC 5.2 11/03/2023 1038   WBC 4.5 10/16/2023 0911   RBC 4.07 11/03/2023 1038   RBC 4.22 10/16/2023 0911   HGB 11.7 11/03/2023 1038   HCT 37.1 11/03/2023 1038   PLT 247 11/03/2023 1038   MCV 91 11/03/2023 1038   MCH 28.7 11/03/2023 1038   MCH 28.9 10/16/2023 0911   MCHC 31.5 11/03/2023 1038   MCHC 33.4 10/16/2023 0911   RDW 14.1 11/03/2023 1038   LYMPHSABS 0.9 07/15/2023 1623   LYMPHSABS 1.8 11/17/2022 1232   MONOABS 0.8 07/15/2023 1623   EOSABS 0.0 07/15/2023 1623   EOSABS 0.2 11/17/2022 1232   BASOSABS 0.0 07/15/2023 1623   BASOSABS 0.1 11/17/2022 1232   Recent Labs    07/15/23 1623 07/16/23 0418 07/17/23 0353 07/18/23 0458 07/19/23 0625 10/16/23 0911 11/03/23 1038  HGB 12.4 12.2 12.3 12.6 12.7 12.2 11.7    CMP     Component Value Date/Time   NA 139 11/03/2023 1038   K 3.9 11/03/2023 1038   CL 102 11/03/2023 1038   CO2 22 11/03/2023 1038   GLUCOSE 88 11/03/2023 1038   GLUCOSE 94 10/16/2023 0911   BUN 20 11/03/2023 1038   CREATININE 1.07 (H) 11/03/2023 1038   CREATININE 0.61 11/04/2016 1417   CALCIUM  9.9 11/03/2023 1038   PROT 6.5 11/21/2023 1035   ALBUMIN  3.8 11/21/2023 1035   AST 17 11/21/2023 1035   ALT 19 11/21/2023 1035   ALKPHOS 95 11/21/2023 1035   BILITOT 0.4 11/21/2023 1035   GFRNONAA 54 (L) 10/16/2023 0911    GFRAA 86 02/21/2019 0957      Latest Ref Rng & Units 11/21/2023   10:35 AM 07/15/2023    4:23 PM 11/17/2022   12:32 PM  Hepatic Function  Total Protein 6.0 - 8.5 g/dL 6.5  6.6  6.8   Albumin  3.8 - 4.8 g/dL 3.8  3.2  3.9   AST 0 - 40 IU/L 17  27  17    ALT 0 - 32 IU/L 19  17  17    Alk Phosphatase 44 - 121 IU/L 95  75  112   Total Bilirubin 0.0 - 1.2 mg/dL 0.4  0.6  0.4   Bilirubin, Direct 0.00 - 0.40 mg/dL 9.78         Current Medications:   Current Outpatient Medications (Endocrine & Metabolic):    tirzepatide  (MOUNJARO ) 15 MG/0.5ML Pen, Inject 15 mg into the skin once a week.  Current Outpatient Medications (Cardiovascular):    carvedilol  (COREG ) 12.5 MG tablet, Take 1 tablet (12.5 mg total) by mouth 2 (two) times daily with a meal.   Evolocumab  (REPATHA  SURECLICK) 140 MG/ML SOAJ, Inject 140  mg into the skin every 14 (fourteen) days.   irbesartan  (AVAPRO ) 300 MG tablet, Take 1 tablet (300 mg total) by mouth daily.   nitroGLYCERIN  (NITROSTAT ) 0.4 MG SL tablet, Place 1 tablet (0.4 mg total) under the tongue every 5 (five) minutes as needed for chest pain.   rosuvastatin  (CRESTOR ) 40 MG tablet, Take 1 tablet (40 mg total) by mouth daily.   spironolactone  (ALDACTONE ) 25 MG tablet, Take 1 tablet (25 mg total) by mouth daily.   chlorthalidone  (HYGROTON ) 25 MG tablet, Take 1 tablet (25 mg total) by mouth daily. (Patient not taking: Reported on 02/08/2024)  Current Outpatient Medications (Respiratory):    albuterol  (VENTOLIN  HFA) 108 (90 Base) MCG/ACT inhaler, INHALE 1-2 PUFFS BY MOUTH EVERY 6 HOURS AS NEEDED FOR WHEEZE OR SHORTNESS OF BREATH   Fluticasone  Furoate (ARNUITY ELLIPTA ) 100 MCG/ACT AEPB, Inhale 2 puffs into the lungs daily. (Patient not taking: Reported on 02/08/2024)  Current Outpatient Medications (Analgesics):    acetaminophen  (TYLENOL ) 500 MG tablet, Take 2 tablets (1,000 mg total) by mouth 3 (three) times daily as needed for mild pain, moderate pain, fever or headache.    aspirin  EC 81 MG tablet, Take 1 tablet (81 mg total) by mouth daily. Swallow whole.  Current Outpatient Medications (Hematological):    clopidogrel  (PLAVIX ) 75 MG tablet, Take 1 tablet (75 mg total) by mouth daily with breakfast.  Current Outpatient Medications (Other):    bisacodyl  (DULCOLAX) 5 MG EC tablet, Take 10 mg by mouth daily at 6 (six) AM.   Blood Glucose Monitoring Suppl DEVI, 1 each by Does not apply route in the morning and at bedtime. May substitute to any manufacturer covered by patient's insurance.   calcium  carbonate (TUMS - DOSED IN MG ELEMENTAL CALCIUM ) 500 MG chewable tablet, Chew 3-4 tablets by mouth daily.   Calcium  Carbonate Antacid (ALKA-SELTZER ANTACID PO), Take by mouth as needed.   glucose blood (ACCU-CHEK GUIDE) test strip, Use as instructed   Glucose Blood (BLOOD GLUCOSE TEST STRIPS) STRP, 1 each by Does not apply route in the morning and at bedtime. Please give the pt. Test strips for Prodigy glucose monitor   hydrocortisone  (ANUSOL -HC) 2.5 % rectal cream, Place 1 Application rectally 2 (two) times daily.   Lancets (ONETOUCH ULTRASOFT) lancets, Use as instructed   linaclotide  (LINZESS ) 145 MCG CAPS capsule, Take 1 capsule (145 mcg total) by mouth daily before breakfast.   linaclotide  (LINZESS ) 145 MCG CAPS capsule, Take 1 capsule (145 mcg total) by mouth daily before breakfast. Exp: 07-2024, Lot: [id number]   Multiple Vitamins-Minerals (CENTRUM SILVER PO), Take 1 capsule by mouth daily.   polyethylene glycol-electrolytes (NULYTELY ) 420 g solution, Drink 6 oz every 30 mins until the impaction passes, can do 1/2 gallon on first day and 1/2 gallon on 2nd day.  Can continue 2 fleets enemas a day until it resolves.   Vitamin D , Ergocalciferol , (DRISDOL ) 1.25 MG (50000 UNIT) CAPS capsule, Take by mouth.   solifenacin  (VESICARE ) 10 MG tablet, Take 1 tablet (10 mg total) by mouth daily.  Medical History:  Past Medical History:  Diagnosis Date   Anemia    Anxiety    Asthma     has inhalers   CAD (coronary artery disease) 02/15/2007   Cataract associated with type 2 diabetes mellitus (HCC) 07/21/2015   DDD (degenerative disc disease), lumbar 02/04/2016   Depression    Diabetes mellitus (HCC)    diet controlled- NO MEDS   Diverticulosis    DJD (degenerative joint disease) 03/17/2011  Essential hypertension 05/27/2021   GERD (gastroesophageal reflux disease)    History of colonic polyps 09/05/2012   Tubular adenoma   History of kidney stones    Hypercholesteremia    Morbid obesity with BMI of 40.0-44.9, adult (HCC) 03/17/2011   OAB (overactive bladder) 11/04/2016   Osteoporosis 06/01/2021   Sleep apnea    No cpap does not tolerate   Tubular adenoma of colon    Allergies:  Allergies  Allergen Reactions   Amlodipine      Gum issues   Farxiga  [Dapagliflozin]     Yeast infection   Metformin And Related     GI upset   Ozempic  (0.25 Or 0.5 Mg-Dose) [Semaglutide (0.25 Or 0.5mg -Dos)] Other (See Comments)    Acid reflux     Surgical History:  She  has a past surgical history that includes Hemorroidectomy; Cesarean section; Replacement total knee (2012); Cardiac catheterization (2011); Dilation and curettage of uterus; Breast lumpectomy with radioactive seed localization (Left, 10/03/2013); Tumor excision (Right); Total knee arthroplasty (Right, 06/10/2014); Breast excisional biopsy (Left); Colonoscopy (2014); Polypectomy; Total hip arthroplasty (Right, 07/24/2021); LEFT HEART CATH AND CORONARY ANGIOGRAPHY (N/A, 07/18/2023); and CORONARY STENT INTERVENTION (N/A, 07/18/2023). Family History:  Her family history includes Cancer in her father; Diabetes in her mother. She was adopted.  REVIEW OF SYSTEMS  : All other systems reviewed and negative except where noted in the History of Present Illness.  PHYSICAL EXAM: BP 130/80 Comment: large cuff  Pulse 92   Ht 5' 7.5 (1.715 m)   Wt 255 lb (115.7 kg)   BMI 39.35 kg/m  General Appearance: Obese, in no apparent  distress. Respiratory: Respiratory effort normal, BS equal bilaterally without rales, rhonchi, wheezing. Cardio: RRR with no MRGs. Abdomen: Soft,  Obese ,decreased bowel sounds. No tenderness. Without guarding and Without rebound. No masses. Rectal: declines Musculoskeletal: Full ROM, gait not tested, patient in wheelchair Neuro: Alert and  oriented x4;  No focal deficits. Psych:  Cooperative. Normal mood and affect.       Alan JONELLE Coombs, PA-C 11:58 AM

## 2024-02-08 ENCOUNTER — Other Ambulatory Visit (HOSPITAL_COMMUNITY): Payer: Self-pay

## 2024-02-08 ENCOUNTER — Ambulatory Visit: Admitting: Physician Assistant

## 2024-02-08 ENCOUNTER — Encounter: Payer: Self-pay | Admitting: Physician Assistant

## 2024-02-08 VITALS — BP 130/80 | HR 92 | Ht 67.5 in | Wt 255.0 lb

## 2024-02-08 DIAGNOSIS — K2100 Gastro-esophageal reflux disease with esophagitis, without bleeding: Secondary | ICD-10-CM

## 2024-02-08 DIAGNOSIS — K219 Gastro-esophageal reflux disease without esophagitis: Secondary | ICD-10-CM

## 2024-02-08 DIAGNOSIS — I2510 Atherosclerotic heart disease of native coronary artery without angina pectoris: Secondary | ICD-10-CM | POA: Diagnosis not present

## 2024-02-08 DIAGNOSIS — K5900 Constipation, unspecified: Secondary | ICD-10-CM

## 2024-02-08 DIAGNOSIS — Z860101 Personal history of adenomatous and serrated colon polyps: Secondary | ICD-10-CM

## 2024-02-08 DIAGNOSIS — E1165 Type 2 diabetes mellitus with hyperglycemia: Secondary | ICD-10-CM | POA: Diagnosis not present

## 2024-02-08 DIAGNOSIS — E119 Type 2 diabetes mellitus without complications: Secondary | ICD-10-CM

## 2024-02-08 DIAGNOSIS — Z794 Long term (current) use of insulin: Secondary | ICD-10-CM | POA: Diagnosis not present

## 2024-02-08 DIAGNOSIS — K5904 Chronic idiopathic constipation: Secondary | ICD-10-CM

## 2024-02-08 DIAGNOSIS — R112 Nausea with vomiting, unspecified: Secondary | ICD-10-CM

## 2024-02-08 DIAGNOSIS — Z6839 Body mass index (BMI) 39.0-39.9, adult: Secondary | ICD-10-CM | POA: Diagnosis not present

## 2024-02-08 MED ORDER — LINACLOTIDE 145 MCG PO CAPS: 145.0000 ug | ORAL_CAPSULE | Freq: Every day | ORAL | 0 refills | Status: DC

## 2024-02-08 MED ORDER — LINACLOTIDE 145 MCG PO CAPS
145.0000 ug | ORAL_CAPSULE | Freq: Every day | ORAL | 3 refills | Status: DC
  Filled 2024-02-08: qty 90, 90d supply, fill #0

## 2024-02-08 MED ORDER — SOLIFENACIN SUCCINATE 10 MG PO TABS
10.0000 mg | ORAL_TABLET | Freq: Every day | ORAL | 0 refills | Status: DC
  Filled 2024-02-08: qty 90, 90d supply, fill #0

## 2024-02-08 MED ORDER — PEG 3350-KCL-NA BICARB-NACL 420 G PO SOLR
ORAL | 0 refills | Status: DC
  Filled 2024-02-08: qty 4000, 2d supply, fill #0

## 2024-02-08 MED ORDER — HYDROCORTISONE (PERIANAL) 2.5 % EX CREA
1.0000 | TOPICAL_CREAM | Freq: Two times a day (BID) | CUTANEOUS | 2 refills | Status: DC
  Filled 2024-02-08: qty 30, 15d supply, fill #0

## 2024-02-08 NOTE — Patient Instructions (Signed)
 Please send in Trilyte - take as directed- but do 6 oz every 30 mins until the impaction passes, can do 1/2 gallon on first day and 1/2 gallon on 2nd day.  Can continue 2 fleets enemas a day until it resolves.  Go to ER if any severe AB pain, vomiting/unable to hold down food/drink, blood in stool, or unable to pass gas.  We have given you samples of the following medication to take:  Linzess  145 mcg: Take 30 minutes before a meal  Linzess  145 mcg daily *IBS-C patients may begin to experience relief from belly pain and overall abdominal symptoms (pain, discomfort, and bloating) in about 1 week,  with symptoms typically improving over 12 weeks.  Take at least 30 minutes before the first meal of the day on an empty stomach You can have a loose stool if you eat a high-fat breakfast. Give it at least 7 days, may have more bowel movements during that time.   The diarrhea should go away and you should start having normal, complete, full bowel movements.  It may be helpful to start treatment when you can be near the comfort of your own bathroom, such as a weekend.  After you are out we can send in a prescription if you did well, there is a prescription card   Recommend increasing water  and physical activity.   - Drink at least 64-80 ounces of water /liquid per day. - Establish a time to try to move your bowels every day.  For many people, this is after a cup of coffee or after a meal such as breakfast. - Sit all of the way back on the toilet keeping your back fairly straight and while sitting up, try to rest the tops of your forearms on your upper thighs.   - Raising your feet with a step stool/squatty potty can be helpful to improve the angle that allows your stool to pass through the rectum. - Relax the rectum feeling it bulge toward the toilet water .  If you feel your rectum raising toward your body, you are contracting rather than relaxing. - Breathe in and slowly exhale. Belly breath by  expanding your belly towards your belly button. Keep belly expanded as you gently direct pressure down and back to the anus.  A low pitched GRRR sound can assist with increasing intra-abdominal pressure.  (Can also trying to blow on a pinwheel and make it move, this helps with the same belly breathing) - Repeat 3-4 times. If unsuccessful, contract the pelvic floor to restore normal tone and get off the toilet.  Avoid excessive straining. - To reduce excessive wiping by teaching your anus to normally contract, place hands on outer aspect of knees and resist knee movement outward.  Hold 5-10 second then place hands just inside of knees and resist inward movement of knees.  Hold 5 seconds.  Repeat a few times each way.  Go to the ER if unable to pass gas, severe AB pain, unable to hold down food, any shortness of breath of chest pain.  Gastroparesis- this can come from the moujaro Gastroparesis is a condition in which food takes longer than normal to empty from the stomach.  This condition is also known as delayed gastric emptying. It is usually a long-term (chronic) condition.  What are the signs or symptoms? Symptoms of this condition include: Feeling full after eating very little or a loss of appetite. Nausea, vomiting, or heartburn. Bloating of your abdomen. Inconsistent blood sugar (glucose) levels on  blood tests. Unexplained weight loss. Acid from the stomach coming up into the esophagus (gastroesophageal reflux). Sudden tightening (spasm) of the stomach, which can be painful. Symptoms may come and go. Some people may not notice any symptoms.  What increases the risk? You are more likely to develop this condition if: You have certain disorders or diseases. These may include: An endocrine disorder. An eating disorder. Amyloidosis. Scleroderma. Parkinson's disease. Multiple sclerosis. Cancer or infection of the stomach or the vagus nerve. You have had surgery on your stomach or vagus  nerve. You take certain medicines. You are female.  Things you can do: Please do small frequent meals like 4-6 meals a day.  Eat and drink liquids at separate times.  Avoid high fiber foods, cook your vegetables, avoid high fat food.  Suggest spreading protein throughout the day (greek yogurt, glucerna, soft meat, milk, eggs) Choose soft foods that you can mash with a fork When you are more symptomatic, change to pureed foods foods and liquids.  Consider reading Living well with Gastroparesis by [facility] Check out this link to a diet online [email or web address]  You have been scheduled for a follow up appointment on Monday, 04-09-24 at 10:40 am. Please arrive 10 minutes early for registration. If you need to reschedule or cancel this appointment please call [phone] as soon as possible. Thank you.    Thank you for entrusting me with your care and for choosing [facility], Alan Coombs, P.A.-C   _______________________________________________________  If your blood pressure at your visit was 140/90 or greater, please contact your primary care physician to follow up on this.  _______________________________________________________  If you are age 5 or older, your body mass index should be between 23-30. Your Body mass index is 39.35 kg/m. If this is out of the aforementioned range listed, please consider follow up with your Primary Care Provider.  If you are age 47 or younger, your body mass index should be between 19-25. Your Body mass index is 39.35 kg/m. If this is out of the aformentioned range listed, please consider follow up with your Primary Care Provider.   ________________________________________________________  The [facility] providers would like to encourage you to use MYCHART to communicate with providers for non-urgent requests or questions.   Due to long hold times on the telephone, sending your provider a message by [facility] may be a faster and more efficient way to get a response.  Please allow 48 business hours for a response.  Please remember that this is for non-urgent requests.  _______________________________________________________  [facility] is using a team-based approach to care.  Your team is made up of your doctor and two to three APPS. Our APPS (Nurse Practitioners and Physician Assistants) work with your physician to ensure care continuity for you. They are fully qualified to address your health concerns and develop a treatment plan. They communicate directly with your gastroenterologist to care for you. Seeing the Advanced Practice Practitioners on your physician's team can help you by facilitating care more promptly, often allowing for earlier appointments, access to diagnostic testing, procedures, and other specialty referrals.

## 2024-02-09 ENCOUNTER — Other Ambulatory Visit (HOSPITAL_COMMUNITY): Payer: Self-pay

## 2024-02-09 ENCOUNTER — Other Ambulatory Visit: Payer: Self-pay

## 2024-02-09 DIAGNOSIS — R002 Palpitations: Secondary | ICD-10-CM

## 2024-02-09 DIAGNOSIS — Z79899 Other long term (current) drug therapy: Secondary | ICD-10-CM

## 2024-02-10 ENCOUNTER — Other Ambulatory Visit (HOSPITAL_COMMUNITY): Payer: Self-pay

## 2024-02-23 ENCOUNTER — Other Ambulatory Visit (HOSPITAL_COMMUNITY): Payer: Self-pay

## 2024-02-23 DIAGNOSIS — R002 Palpitations: Secondary | ICD-10-CM | POA: Diagnosis not present

## 2024-02-23 DIAGNOSIS — Z79899 Other long term (current) drug therapy: Secondary | ICD-10-CM | POA: Diagnosis not present

## 2024-02-24 ENCOUNTER — Ambulatory Visit: Payer: Self-pay | Admitting: Nurse Practitioner

## 2024-02-24 LAB — BASIC METABOLIC PANEL WITH GFR
BUN/Creatinine Ratio: 13 (ref 12–28)
BUN: 10 mg/dL (ref 8–27)
CO2: 18 mmol/L — AB (ref 20–29)
Calcium: 9.9 mg/dL (ref 8.7–10.3)
Chloride: 103 mmol/L (ref 96–106)
Creatinine, Ser: 0.75 mg/dL (ref 0.57–1.00)
Glucose: 94 mg/dL (ref 70–99)
Potassium: 4.5 mmol/L (ref 3.5–5.2)
Sodium: 139 mmol/L (ref 134–144)
eGFR: 83 mL/min/1.73 (ref >59)

## 2024-02-29 ENCOUNTER — Other Ambulatory Visit: Payer: Self-pay

## 2024-02-29 ENCOUNTER — Other Ambulatory Visit: Payer: Self-pay | Admitting: Internal Medicine

## 2024-02-29 DIAGNOSIS — E119 Type 2 diabetes mellitus without complications: Secondary | ICD-10-CM

## 2024-03-02 ENCOUNTER — Other Ambulatory Visit (HOSPITAL_COMMUNITY): Payer: Self-pay

## 2024-03-02 MED ORDER — MOUNJARO 15 MG/0.5ML ~~LOC~~ SOAJ
15.0000 mg | SUBCUTANEOUS | 5 refills | Status: AC
  Filled 2024-03-02: qty 2, 28d supply, fill #0
  Filled 2024-04-02: qty 2, 28d supply, fill #1
  Filled 2024-05-03: qty 2, 28d supply, fill #2
  Filled 2024-05-31: qty 2, 28d supply, fill #3

## 2024-03-12 ENCOUNTER — Other Ambulatory Visit: Payer: Self-pay

## 2024-03-12 ENCOUNTER — Other Ambulatory Visit (HOSPITAL_COMMUNITY): Payer: Self-pay

## 2024-03-12 MED ORDER — POTASSIUM CHLORIDE ER 10 MEQ PO TBCR
10.0000 meq | EXTENDED_RELEASE_TABLET | Freq: Every day | ORAL | 1 refills | Status: AC
  Filled 2024-03-12: qty 90, 90d supply, fill #0
  Filled 2024-06-15: qty 90, 90d supply, fill #1

## 2024-03-12 NOTE — Telephone Encounter (Signed)
 Contacted the patient and made her aware that Dr Santo has refilled her potassium. Patient voiced understanding. Patient also notified that she will be contacted to schedule a follow up appt for March of nest year. Patient voiced understanding.

## 2024-03-14 ENCOUNTER — Other Ambulatory Visit (HOSPITAL_COMMUNITY): Payer: Self-pay

## 2024-03-19 DIAGNOSIS — I130 Hypertensive heart and chronic kidney disease with heart failure and stage 1 through stage 4 chronic kidney disease, or unspecified chronic kidney disease: Secondary | ICD-10-CM | POA: Diagnosis not present

## 2024-03-19 DIAGNOSIS — I509 Heart failure, unspecified: Secondary | ICD-10-CM | POA: Diagnosis not present

## 2024-03-19 DIAGNOSIS — K219 Gastro-esophageal reflux disease without esophagitis: Secondary | ICD-10-CM | POA: Diagnosis not present

## 2024-03-19 DIAGNOSIS — I700 Atherosclerosis of aorta: Secondary | ICD-10-CM | POA: Diagnosis not present

## 2024-03-19 DIAGNOSIS — E1122 Type 2 diabetes mellitus with diabetic chronic kidney disease: Secondary | ICD-10-CM | POA: Diagnosis not present

## 2024-03-19 DIAGNOSIS — Z7982 Long term (current) use of aspirin: Secondary | ICD-10-CM | POA: Diagnosis not present

## 2024-03-19 DIAGNOSIS — E785 Hyperlipidemia, unspecified: Secondary | ICD-10-CM | POA: Diagnosis not present

## 2024-03-19 DIAGNOSIS — N1831 Chronic kidney disease, stage 3a: Secondary | ICD-10-CM | POA: Diagnosis not present

## 2024-03-22 ENCOUNTER — Encounter: Payer: Self-pay | Admitting: Family Medicine

## 2024-03-22 ENCOUNTER — Ambulatory Visit: Admitting: Family Medicine

## 2024-03-22 VITALS — BP 126/80 | HR 70 | Ht 67.5 in | Wt 245.2 lb

## 2024-03-22 DIAGNOSIS — Z955 Presence of coronary angioplasty implant and graft: Secondary | ICD-10-CM

## 2024-03-22 DIAGNOSIS — E1169 Type 2 diabetes mellitus with other specified complication: Secondary | ICD-10-CM | POA: Diagnosis not present

## 2024-03-22 DIAGNOSIS — N3281 Overactive bladder: Secondary | ICD-10-CM

## 2024-03-22 DIAGNOSIS — Z7985 Long-term (current) use of injectable non-insulin antidiabetic drugs: Secondary | ICD-10-CM | POA: Diagnosis not present

## 2024-03-22 DIAGNOSIS — Z789 Other specified health status: Secondary | ICD-10-CM

## 2024-03-22 DIAGNOSIS — N183 Chronic kidney disease, stage 3 unspecified: Secondary | ICD-10-CM

## 2024-03-22 LAB — POCT GLYCOSYLATED HEMOGLOBIN (HGB A1C): Hemoglobin A1C: 5.6 % (ref 4.0–5.6)

## 2024-03-22 NOTE — Progress Notes (Unsigned)
   Subjective:    Patient ID: Sandra Lawrence, female    DOB: 10/24/1945, 78 y.o.   MRN: [id number]  HPI She is here for a visit concerning her diabetes.  She also has overactive bladder and states that the medication she is on now is helping get this under better control.  She also has a history of heart disease and has had a stent placed in March.  She has been doing well and following up regularly with cardiology.  She was given Farxiga  but unfortunately had yeast infections because of that and this was stopped.  She was then given Ozempic  but had tremendous reflux symptoms and was switched to Mounjaro .  Review of the record indicates that she was then referred to the pharmacist at the cardiology office and they have been slowly increasing her Mounjaro  based on her lack of any major symptoms but has not followed up with checking the weight or looking at her A1c.   Review of Systems     Objective:    Physical Exam  Alert and in no distress.  Hemoglobin A1c is 5.6.      Assessment & Plan:  Type 2 diabetes mellitus with other specified complication, without long-term current use of insulin  (HCC) - Plan: HgB A1c  Stage 3 chronic kidney disease, unspecified whether stage 3a or 3b CKD (HCC)  OAB (overactive bladder)  Status post coronary artery stent placement  Medication intolerance  She seems to be stable on her present regimen and has lost weight which I think is good.  Follow-up here in 4 months.

## 2024-04-02 ENCOUNTER — Other Ambulatory Visit: Payer: Self-pay

## 2024-04-02 ENCOUNTER — Other Ambulatory Visit (HOSPITAL_COMMUNITY): Payer: Self-pay

## 2024-04-05 ENCOUNTER — Other Ambulatory Visit (HOSPITAL_COMMUNITY): Payer: Self-pay

## 2024-04-05 DIAGNOSIS — M47816 Spondylosis without myelopathy or radiculopathy, lumbar region: Secondary | ICD-10-CM | POA: Diagnosis not present

## 2024-04-06 NOTE — Progress Notes (Unsigned)
 04/09/2024 [name] [id number] 05-19-1945  Referring provider: Joyce Norleen BROCKS, MD Primary GI doctor: Dr. Albertus  ASSESSMENT AND PLAN:  Constipation  Worsening constipation likely secondary to addition of GLP1 and decreased movement from back pain resulting in disimpaction Colonoscopy 01/2023 multiple polyps diverticulosis IH recall 3 years Patient is on Mounjaro  x 2-3 months, Vesicare , chlorthalidone , spironolactone  Did bowel purge and has been on linzess  145 x 3 weeks but soft/liquid stools daily  -will switch to 72 mcg daily, if this still causes diarrhea can consider amitiza or trulance and/or add in fiber - Increase water  intake, decrease caffeine, increase activity level.  GERD with dysphagia Rice, meats and grits can feel dysphagia - will get barium swallow, consider EGD AFTER 07/2024 due to recent PCI pending results - start on pantoprazole  20 mg daily - chew food well, given dysphagia precautions -Lifestyle changes discussed, avoid NSAIDS, ETOH, hand out given to the patient - gastroparesis diet given due to GLP1 use  Personal history of colon polyps 01/27/2023 colonoscopy difficult due to redundant colon, good bowel prep 2 polyps were 6 mm ascending 5 polyps 4 to 9 mm transverse colon, 2 polyps 4 to 5 mm descending mild diverticulosis and small internal hemorrhoid. All polyps are tubular adenomatous polyps recall 3 years  CAD status post DES stent 07/18/2023 07/16/2023 echo ejection fraction 60 to 65% unremarkable valves 07/18/2023 severe mid circumflex stenosis status post PCI normal ejection fraction aspirin  and clopidogrel  x 12 months Monitor for blood in stool, black stool Continue GLP 1 for DM and secondary cardiovascular prevention, counseled on AE from GLP1 and how to avoid them  Type 2 diabetes with hyperlipidemia On mounjaro  likely contributing to worsening constipation  Morbid obesity  Body mass index is 39.49 kg/m.  -Patient has been advised to make  an attempt to improve diet and exercise patterns to aid in weight loss. -Recommended diet heavy in fruits and veggies and low in animal meats, cheeses, and dairy products, appropriate calorie intake  Patient Care Team: Joyce Norleen BROCKS, MD as PCP - General (Family Medicine) Santo Stanly LABOR, MD as PCP - Cardiology (Cardiology) Shari Sieving, MD as Consulting Physician (Orthopedic Surgery)  HISTORY OF PRESENT ILLNESS: 78 y.o. female with a past medical history listed below presents for evaluation of constipation.   I last saw the patient in the office 02/08/2024 for chronic idiopathic constipation.  Discussed the use of AI scribe software for clinical note transcription with the patient, who gave verbal consent to proceed.  History of Present Illness   Sandra Lawrence is a 78 year old female who presents with worsening bowel symptoms and swallowing difficulties.  She has a long-standing history of constipation, which has recently worsened. A bowel purge was attempted but did not fully relieve her symptoms. She was started on Linzess  145 mcg daily, which initially took three weeks to become effective. However, this led to very loose stools, described as 'runny' and sometimes 'like water ', requiring excessive toilet tissue. She adjusted the dosage to every other day, which improved stool consistency, but she is still trying to incorporate the medication into her daily routine.  She experiences daily heartburn, which used to occur several times a day. Although she denies any burning sensation, she finds it bothersome. She also has difficulty swallowing certain foods, particularly rice, grits, and sometimes meat, which feel like they get stuck and require water  to help them pass. She has reduced her intake of these foods and tries to chew  them thoroughly. She is concerned about whether her esophagus is narrowing with age. She is currently on Mounjaro , which may contribute to her symptoms.  No  chest pain or shortness of breath. She reports feeling better overall with less bloating and discomfort.      She  reports that she quit smoking about 44 years ago. Her smoking use included cigarettes. She has never used smokeless tobacco. She reports that she does not currently use alcohol. She reports that she does not use drugs.  RELEVANT GI HISTORY, IMAGING AND LABS: Results          CBC    Component Value Date/Time   WBC 5.2 11/03/2023 1038   WBC 4.5 10/16/2023 0911   RBC 4.07 11/03/2023 1038   RBC 4.22 10/16/2023 0911   HGB 11.7 11/03/2023 1038   HCT 37.1 11/03/2023 1038   PLT 247 11/03/2023 1038   MCV 91 11/03/2023 1038   MCH 28.7 11/03/2023 1038   MCH 28.9 10/16/2023 0911   MCHC 31.5 11/03/2023 1038   MCHC 33.4 10/16/2023 0911   RDW 14.1 11/03/2023 1038   LYMPHSABS 0.9 07/15/2023 1623   LYMPHSABS 1.8 11/17/2022 1232   MONOABS 0.8 07/15/2023 1623   EOSABS 0.0 07/15/2023 1623   EOSABS 0.2 11/17/2022 1232   BASOSABS 0.0 07/15/2023 1623   BASOSABS 0.1 11/17/2022 1232   Recent Labs    07/15/23 1623 07/16/23 0418 07/17/23 0353 07/18/23 0458 07/19/23 0625 10/16/23 0911 11/03/23 1038  HGB 12.4 12.2 12.3 12.6 12.7 12.2 11.7    CMP     Component Value Date/Time   NA 139 02/23/2024 1014   K 4.5 02/23/2024 1014   CL 103 02/23/2024 1014   CO2 18 (L) 02/23/2024 1014   GLUCOSE 94 02/23/2024 1014   GLUCOSE 94 10/16/2023 0911   BUN 10 02/23/2024 1014   CREATININE 0.75 02/23/2024 1014   CREATININE 0.61 11/04/2016 1417   CALCIUM  9.9 02/23/2024 1014   PROT 6.5 11/21/2023 1035   ALBUMIN  3.8 11/21/2023 1035   AST 17 11/21/2023 1035   ALT 19 11/21/2023 1035   ALKPHOS 95 11/21/2023 1035   BILITOT 0.4 11/21/2023 1035   GFRNONAA 54 (L) 10/16/2023 0911   GFRAA 86 02/21/2019 0957      Latest Ref Rng & Units 11/21/2023   10:35 AM 07/15/2023    4:23 PM 11/17/2022   12:32 PM  Hepatic Function  Total Protein 6.0 - 8.5 g/dL 6.5  6.6  6.8   Albumin  3.8 - 4.8 g/dL 3.8  3.2   3.9   AST 0 - 40 IU/L 17  27  17    ALT 0 - 32 IU/L 19  17  17    Alk Phosphatase 44 - 121 IU/L 95  75  112   Total Bilirubin 0.0 - 1.2 mg/dL 0.4  0.6  0.4   Bilirubin, Direct 0.00 - 0.40 mg/dL 9.78         Current Medications:   Current Outpatient Medications (Endocrine & Metabolic):    tirzepatide  (MOUNJARO ) 15 MG/0.5ML Pen, Inject 15 mg into the skin once a week.  Current Outpatient Medications (Cardiovascular):    carvedilol  (COREG ) 12.5 MG tablet, Take 1 tablet (12.5 mg total) by mouth 2 (two) times daily with a meal.   chlorthalidone  (HYGROTON ) 25 MG tablet, Take 1 tablet (25 mg total) by mouth daily.   Evolocumab  (REPATHA  SURECLICK) 140 MG/ML SOAJ, Inject 140 mg into the skin every 14 (fourteen) days.   irbesartan  (AVAPRO ) 300 MG tablet, Take  1 tablet (300 mg total) by mouth daily.   nitroGLYCERIN  (NITROSTAT ) 0.4 MG SL tablet, Place 1 tablet (0.4 mg total) under the tongue every 5 (five) minutes as needed for chest pain.   rosuvastatin  (CRESTOR ) 40 MG tablet, Take 1 tablet (40 mg total) by mouth daily.   spironolactone  (ALDACTONE ) 25 MG tablet, Take 1 tablet (25 mg total) by mouth daily.  Current Outpatient Medications (Respiratory):    albuterol  (VENTOLIN  HFA) 108 (90 Base) MCG/ACT inhaler, INHALE 1-2 PUFFS BY MOUTH EVERY 6 HOURS AS NEEDED FOR WHEEZE OR SHORTNESS OF BREATH   Fluticasone  Furoate (ARNUITY ELLIPTA ) 100 MCG/ACT AEPB, Inhale 2 puffs into the lungs daily. (Patient not taking: Reported on 04/09/2024)  Current Outpatient Medications (Analgesics):    acetaminophen  (TYLENOL ) 500 MG tablet, Take 2 tablets (1,000 mg total) by mouth 3 (three) times daily as needed for mild pain, moderate pain, fever or headache.   aspirin  EC 81 MG tablet, Take 1 tablet (81 mg total) by mouth daily. Swallow whole.  Current Outpatient Medications (Hematological):    clopidogrel  (PLAVIX ) 75 MG tablet, Take 1 tablet (75 mg total) by mouth daily with breakfast.  Current Outpatient Medications  (Other):    Blood Glucose Monitoring Suppl DEVI, 1 each by Does not apply route in the morning and at bedtime. May substitute to any manufacturer covered by patient's insurance.   calcium  carbonate (TUMS - DOSED IN MG ELEMENTAL CALCIUM ) 500 MG chewable tablet, Chew 3-4 tablets by mouth daily. (Patient taking differently: Chew 3-4 tablets by mouth as needed for indigestion or heartburn.)   Calcium  Carbonate Antacid (ALKA-SELTZER ANTACID PO), Take by mouth as needed.   glucose blood (ACCU-CHEK GUIDE) test strip, Use as instructed   Lancets (ONETOUCH ULTRASOFT) lancets, Use as instructed   linaclotide  (LINZESS ) 72 MCG capsule, Take 1 capsule (72 mcg total) by mouth daily before breakfast.   Multiple Vitamins-Minerals (CENTRUM SILVER PO), Take 1 capsule by mouth daily.   pantoprazole  (PROTONIX ) 20 MG tablet, Take 1 tablet (20 mg total) by mouth daily.   potassium chloride  (KLOR-CON ) 10 MEQ tablet, Take 1 tablet (10 mEq total) by mouth daily.   solifenacin  (VESICARE ) 10 MG tablet, Take 1 tablet (10 mg total) by mouth daily.   Vitamin D , Ergocalciferol , (DRISDOL ) 1.25 MG (50000 UNIT) CAPS capsule, Take by mouth.   bisacodyl  (DULCOLAX) 5 MG EC tablet, Take 10 mg by mouth daily at 6 (six) AM. (Patient not taking: Reported on 04/09/2024)  Medical History:  Past Medical History:  Diagnosis Date   Anemia    Anxiety    Asthma    has inhalers   CAD (coronary artery disease) 02/15/2007   Cataract associated with type 2 diabetes mellitus (HCC) 07/21/2015   DDD (degenerative disc disease), lumbar 02/04/2016   Depression    Diabetes mellitus (HCC)    diet controlled- NO MEDS   Diverticulosis    DJD (degenerative joint disease) 03/17/2011   Essential hypertension 05/27/2021   GERD (gastroesophageal reflux disease)    History of colonic polyps 09/05/2012   Tubular adenoma   History of kidney stones    Hypercholesteremia    Morbid obesity with BMI of 40.0-44.9, adult (HCC) 03/17/2011   OAB  (overactive bladder) 11/04/2016   Osteoporosis 06/01/2021   Sleep apnea    No cpap does not tolerate   Tubular adenoma of colon    Allergies:  Allergies  Allergen Reactions   Amlodipine      Gum issues   Farxiga  [Dapagliflozin]     Yeast  infection   Metformin And Related     GI upset   Ozempic  (0.25 Or 0.5 Mg-Dose) [Semaglutide (0.25 Or 0.5mg -Dos)] Other (See Comments)    Acid reflux     Surgical History:  She  has a past surgical history that includes Hemorroidectomy; Cesarean section; Replacement total knee (2012); Cardiac catheterization (2011); Dilation and curettage of uterus; Breast lumpectomy with radioactive seed localization (Left, 10/03/2013); Tumor excision (Right); Total knee arthroplasty (Right, 06/10/2014); Breast excisional biopsy (Left); Colonoscopy (2014); Polypectomy; Total hip arthroplasty (Right, 07/24/2021); LEFT HEART CATH AND CORONARY ANGIOGRAPHY (N/A, 07/18/2023); and CORONARY STENT INTERVENTION (N/A, 07/18/2023). Family History:  Her family history includes Cancer in her father; Diabetes in her mother. She was adopted.  REVIEW OF SYSTEMS  : All other systems reviewed and negative except where noted in the History of Present Illness.  PHYSICAL EXAM: BP 112/68 (BP Location: Left Arm, Patient Position: Sitting, Cuff Size: Large)   Pulse 72   Ht 5' 5.25 (1.657 m) Comment: height measured without shoes  Wt 239 lb 2 oz (108.5 kg)   BMI 39.49 kg/m  General Appearance: Obese, in no apparent distress. Respiratory: Respiratory effort normal, BS equal bilaterally without rales, rhonchi, wheezing. Cardio: RRR with no MRGs. Abdomen: Soft,  Obese ,decreased bowel sounds. No tenderness. Without guarding and Without rebound. No masses. Rectal: declines Musculoskeletal: Full ROM, gait not tested, patient in wheelchair Neuro: Alert and  oriented x4;  No focal deficits. Psych:  Cooperative. Normal mood and affect.       Alan JONELLE Coombs, PA-C 11:46 AM

## 2024-04-09 ENCOUNTER — Ambulatory Visit: Admitting: Physician Assistant

## 2024-04-09 ENCOUNTER — Encounter: Payer: Self-pay | Admitting: Physician Assistant

## 2024-04-09 ENCOUNTER — Other Ambulatory Visit (HOSPITAL_COMMUNITY): Payer: Self-pay

## 2024-04-09 VITALS — BP 112/68 | HR 72 | Ht 65.25 in | Wt 239.1 lb

## 2024-04-09 DIAGNOSIS — E119 Type 2 diabetes mellitus without complications: Secondary | ICD-10-CM

## 2024-04-09 DIAGNOSIS — K5904 Chronic idiopathic constipation: Secondary | ICD-10-CM

## 2024-04-09 DIAGNOSIS — I2510 Atherosclerotic heart disease of native coronary artery without angina pectoris: Secondary | ICD-10-CM

## 2024-04-09 DIAGNOSIS — K2100 Gastro-esophageal reflux disease with esophagitis, without bleeding: Secondary | ICD-10-CM | POA: Diagnosis not present

## 2024-04-09 DIAGNOSIS — Z860101 Personal history of adenomatous and serrated colon polyps: Secondary | ICD-10-CM | POA: Diagnosis not present

## 2024-04-09 DIAGNOSIS — Z794 Long term (current) use of insulin: Secondary | ICD-10-CM

## 2024-04-09 DIAGNOSIS — R112 Nausea with vomiting, unspecified: Secondary | ICD-10-CM

## 2024-04-09 DIAGNOSIS — R1310 Dysphagia, unspecified: Secondary | ICD-10-CM

## 2024-04-09 MED ORDER — DULOXETINE HCL 30 MG PO CPEP
60.0000 mg | ORAL_CAPSULE | Freq: Every day | ORAL | 2 refills | Status: DC
  Filled 2024-04-09: qty 60, 30d supply, fill #0

## 2024-04-09 MED ORDER — PANTOPRAZOLE SODIUM 20 MG PO TBEC
20.0000 mg | DELAYED_RELEASE_TABLET | Freq: Every day | ORAL | 1 refills | Status: AC
Start: 2024-04-09 — End: ?
  Filled 2024-04-09: qty 90, 90d supply, fill #0

## 2024-04-09 MED ORDER — LINACLOTIDE 72 MCG PO CAPS
72.0000 ug | ORAL_CAPSULE | Freq: Every day | ORAL | 3 refills | Status: AC
  Filled 2024-04-09: qty 90, 90d supply, fill #0

## 2024-04-09 NOTE — Patient Instructions (Addendum)
 You have been scheduled for a Barium Esophogram at [facility] (1st floor of the hospital) on Thursday, 04/26/24 at 10:00 am. Please arrive 30 minutes prior to your appointment for registration. Make certain not to have anything to eat or drink 3 hours prior to your test. If you need to reschedule for any reason, please contact radiology at [phone] to do so. __________________________________________________________________ A barium swallow is an examination that concentrates on views of the esophagus. This tends to be a double contrast exam (barium and two liquids which, when combined, create a gas to distend the wall of the oesophagus) or single contrast (non-ionic iodine  based). The study is usually tailored to your symptoms so a good history is essential. Attention is paid during the study to the form, structure and configuration of the esophagus, looking for functional disorders (such as aspiration, dysphagia, achalasia, motility and reflux) EXAMINATION You may be asked to change into a gown, depending on the type of swallow being performed. A radiologist and radiographer will perform the procedure. The radiologist will advise you of the type of contrast selected for your procedure and direct you during the exam. You will be asked to stand, sit or lie in several different positions and to hold a small amount of fluid in your mouth before being asked to swallow while the imaging is performed .In some instances you may be asked to swallow barium coated marshmallows to assess the motility of a solid food bolus. The exam can be recorded as a digital or video fluoroscopy procedure. POST PROCEDURE It will take 1-2 days for the barium to pass through your system. To facilitate this, it is important, unless otherwise directed, to increase your fluids for the next 24-48hrs and to resume your normal diet.  This test typically takes about 30 minutes to  perform. __________________________________________________________________________________  We have given you samples of the following medication to take: Linzess  72 mcg  *IBS-C patients may begin to experience relief from belly pain and overall abdominal symptoms (pain, discomfort, and bloating) in about 1 week,  with symptoms typically improving over 12 weeks.  Take at least 30 minutes before the first meal of the day on an empty stomach You can have a loose stool if you eat a high-fat breakfast. Give it at least 7 days, may have more bowel movements during that time.   The diarrhea should go away and you should start having normal, complete, full bowel movements.  It may be helpful to start treatment when you can be near the comfort of your own bathroom, such as a weekend.  After you are out we can send in a prescription if you did well, there is a prescription card  Dysphagia precautions:  1. Take reflux medication ( pantoprazole  20 mg ) 30+ minutes before food in the morning 2. Begin meals with warm beverage 3. Eat smaller more frequent meals 4. Eat slowly, taking small bites and sips 5. Alternate solids and liquids 6. Avoid foods/liquids that increase acid production 7. Sit upright during and for 30+ minutes after meals to facilitate esophageal clearing 8. All meats should be chopped finely.   If something gets hung in your esophagus and will not come up or go down, proceed to the emergency room.     Toileting tips to help with your constipation - Drink at least 64-80 ounces of water /liquid per day. - Establish a time to try to move your bowels every day.  For many people, this is after a cup  of coffee or after a meal such as breakfast. - Sit all of the way back on the toilet keeping your back fairly straight and while sitting up, try to rest the tops of your forearms on your upper thighs.   - Raising your feet with a step stool/squatty potty can be helpful to improve the  angle that allows your stool to pass through the rectum. - Relax the rectum feeling it bulge toward the toilet water .  If you feel your rectum raising toward your body, you are contracting rather than relaxing. - Breathe in and slowly exhale. Belly breath by expanding your belly towards your belly button. Keep belly expanded as you gently direct pressure down and back to the anus.  A low pitched GRRR sound can assist with increasing intra-abdominal pressure.  (Can also trying to blow on a pinwheel and make it move, this helps with the same belly breathing) - Repeat 3-4 times. If unsuccessful, contract the pelvic floor to restore normal tone and get off the toilet.  Avoid excessive straining. - To reduce excessive wiping by teaching your anus to normally contract, place hands on outer aspect of knees and resist knee movement outward.  Hold 5-10 second then place hands just inside of knees and resist inward movement of knees.  Hold 5 seconds.  Repeat a few times each way.  Go to the ER if unable to pass gas, severe AB pain, unable to hold down food, any shortness of breath of chest pain.  FIBER SUPPLEMENT You can do metamucil or fibercon once or twice a day but if this causes gas/bloating please switch to Benefiber or Citracel.  Fiber is good for constipation/diarrhea/irritable bowel syndrome.  It can also help with weight loss and can help lower your bad cholesterol (LDL).  Please do 1 TBSP in the morning in water , coffee, or tea.  It can take up to a month before you can see a difference with your bowel movements.  It is cheapest from [employer], [employer], [employer].    Thank you for trusting me with your gastrointestinal care!  Alan Coombs, PA-C    Gastroparesis Gastroparesis is a condition in which food takes longer than normal to empty from the stomach.  This condition is also known as delayed gastric emptying. It is usually a long-term (chronic) condition.  What are the signs or  symptoms? Symptoms of this condition include: Feeling full after eating very little or a loss of appetite. Nausea, vomiting, or heartburn. Bloating of your abdomen. Inconsistent blood sugar (glucose) levels on blood tests. Unexplained weight loss. Acid from the stomach coming up into the esophagus (gastroesophageal reflux). Sudden tightening (spasm) of the stomach, which can be painful. Symptoms may come and go. Some people may not notice any symptoms.  What increases the risk? You are more likely to develop this condition if: You have certain disorders or diseases. These may include: An endocrine disorder. An eating disorder. Amyloidosis. Scleroderma. Parkinson's disease. Multiple sclerosis. Cancer or infection of the stomach or the vagus nerve. You have had surgery on your stomach or vagus nerve. You take certain medicines. You are female.  Things you can do: Please do small frequent meals like 4-6 meals a day.  Eat and drink liquids at separate times.  Avoid high fiber foods, cook your vegetables, avoid high fat food.  Suggest spreading protein throughout the day (greek yogurt, glucerna, soft meat, milk, eggs) Choose soft foods that you can mash with a fork When you are more symptomatic,  change to pureed foods foods and liquids.  Consider reading Living well with Gastroparesis by [facility] Check out this link to a diet online [email or web address].groupjournal.fr   _______________________________________________________  If your blood pressure at your visit was 140/90 or greater, please contact your primary care physician to follow up on this.  _______________________________________________________  If you are age 28 or older, your body mass index should be between 23-30. Your Body mass index is 39.49 kg/m. If this is out of the aforementioned range listed, please consider follow  up with your Primary Care Provider.  If you are age 9 or younger, your body mass index should be between 19-25. Your Body mass index is 39.49 kg/m. If this is out of the aformentioned range listed, please consider follow up with your Primary Care Provider.   ________________________________________________________  The [facility] providers would like to encourage you to use MYCHART to communicate with providers for non-urgent requests or questions.  Due to long hold times on the telephone, sending your provider a message by [facility] may be a faster and more efficient way to get a response.  Please allow 48 business hours for a response.  Please remember that this is for non-urgent requests.  _______________________________________________________  [facility] is using a team-based approach to care.  Your team is made up of your doctor and two to three APPS. Our APPS (Nurse Practitioners and Physician Assistants) work with your physician to ensure care continuity for you. They are fully qualified to address your health concerns and develop a treatment plan. They communicate directly with your gastroenterologist to care for you. Seeing the Advanced Practice Practitioners on your physician's team can help you by facilitating care more promptly, often allowing for earlier appointments, access to diagnostic testing, procedures, and other specialty referrals.

## 2024-04-10 ENCOUNTER — Other Ambulatory Visit (HOSPITAL_COMMUNITY): Payer: Self-pay

## 2024-04-11 ENCOUNTER — Other Ambulatory Visit (HOSPITAL_COMMUNITY): Payer: Self-pay

## 2024-04-26 ENCOUNTER — Ambulatory Visit (HOSPITAL_COMMUNITY)

## 2024-05-02 NOTE — Progress Notes (Signed)
 Sandra Lawrence                                          MRN: [id number]   05/02/2024   The [facility] Quality Team Specialist reviewed this patient medical record for the purposes of chart review for care gap closure. The following were reviewed: chart review for care gap closure-kidney health evaluation for diabetes:eGFR  and uACR.    [facility] Quality Team

## 2024-05-03 ENCOUNTER — Other Ambulatory Visit (HOSPITAL_COMMUNITY)

## 2024-05-04 ENCOUNTER — Ambulatory Visit: Admitting: Medical

## 2024-05-04 ENCOUNTER — Other Ambulatory Visit (HOSPITAL_COMMUNITY): Payer: Self-pay

## 2024-05-04 VITALS — BP 108/68 | HR 98 | Temp 97.9°F | Wt 230.0 lb

## 2024-05-04 DIAGNOSIS — J988 Other specified respiratory disorders: Secondary | ICD-10-CM | POA: Diagnosis not present

## 2024-05-04 DIAGNOSIS — R042 Hemoptysis: Secondary | ICD-10-CM

## 2024-05-04 DIAGNOSIS — R051 Acute cough: Secondary | ICD-10-CM | POA: Diagnosis not present

## 2024-05-04 LAB — POC COVID19/FLU A&B COMBO
Covid Antigen, POC: NEGATIVE
Influenza A Antigen, POC: NEGATIVE
Influenza B Antigen, POC: NEGATIVE

## 2024-05-04 MED ORDER — AZITHROMYCIN 250 MG PO TABS
ORAL_TABLET | ORAL | 0 refills | Status: AC
  Filled 2024-05-04: qty 6, 5d supply, fill #0

## 2024-05-04 MED ORDER — AZITHROMYCIN 250 MG PO TABS
ORAL_TABLET | ORAL | 0 refills | Status: DC
  Filled 2024-05-04: qty 6, 5d supply, fill #0

## 2024-05-04 MED ORDER — BENZONATATE 100 MG PO CAPS
100.0000 mg | ORAL_CAPSULE | Freq: Two times a day (BID) | ORAL | 0 refills | Status: AC | PRN
  Filled 2024-05-04 (×2): qty 20, 10d supply, fill #0

## 2024-05-04 MED ORDER — AZITHROMYCIN 250 MG PO TABS: ORAL_TABLET | ORAL | 0 refills | Status: DC

## 2024-05-04 MED ORDER — BENZONATATE 100 MG PO CAPS: 100.0000 mg | ORAL_CAPSULE | Freq: Two times a day (BID) | ORAL | 0 refills | Status: DC | PRN

## 2024-05-04 MED ORDER — BENZONATATE 100 MG PO CAPS
100.0000 mg | ORAL_CAPSULE | Freq: Two times a day (BID) | ORAL | 0 refills | Status: DC | PRN
  Filled 2024-05-04: qty 20, 10d supply, fill #0

## 2024-05-04 NOTE — Progress Notes (Addendum)
 "  Name: Sandra Lawrence   Date of Visit: 05/04/2024   Date of last visit with me: Visit date not found   CHIEF COMPLAINT:  Chief Complaint  Patient presents with   Acute Visit    Sick since last Saturday. Coughing a lot, now coughing up blood. Symptoms-sneezing, fever, sore throat, chills,sinus pressure       HPI:  Discussed the use of AI scribe software for clinical note transcription with the patient, who gave verbal consent to proceed.  History of Present Illness   Sandra Lawrence is a 78 year old female who presents with cough and hemoptysis.  She has been feeling unwell for almost a week, initially experiencing sneezing, chills, fever, and cough. Although she began feeling better by Thursday/yesterday, she noticed blood in her sputum over the last couple of days. She reports that the hemoptysis is more than just pink-tinged and that she can see blood. She has not measured her fever but believes it has been elevated. No sweats, but she reports chills. No nausea, vomiting, or diarrhea, although she typically experiences diarrhea due to her medication, linzess , which she is still adjusting the dosage for.  She is using albuterol  inhaler, which she used last night and believes it helped. She is allergic to amlodipine , Farxiga , metformin, and Ozempic . She has an Arnuity inhaler but has not used it. She has not been taking any over-the-counter medications for her symptoms, such as Mucinex.  Her brother, who visited to help her, is now experiencing symptoms like a runny nose and sore throat. She denies smoking and has not been around anyone homeless or in prison, no concern for TB. She reports a history of pneumonia about a year ago.  No other aggravating or relieving factors. No other complaint.  Past Medical History:  Diagnosis Date   Anemia    Anxiety    Asthma    has inhalers   CAD (coronary artery disease) 02/15/2007   Cataract associated with type 2 diabetes mellitus (HCC)  07/21/2015   DDD (degenerative disc disease), lumbar 02/04/2016   Depression    Diabetes mellitus (HCC)    diet controlled- NO MEDS   Diverticulosis    DJD (degenerative joint disease) 03/17/2011   Essential hypertension 05/27/2021   GERD (gastroesophageal reflux disease)    History of colonic polyps 09/05/2012   Tubular adenoma   History of kidney stones    Hypercholesteremia    Morbid obesity with BMI of 40.0-44.9, adult (HCC) 03/17/2011   OAB (overactive bladder) 11/04/2016   Osteoporosis 06/01/2021   Sleep apnea    No cpap does not tolerate   Tubular adenoma of colon    Current Outpatient Medications on File Prior to Visit  Medication Sig Dispense Refill   albuterol  (VENTOLIN  HFA) 108 (90 Base) MCG/ACT inhaler INHALE 1-2 PUFFS BY MOUTH EVERY 6 HOURS AS NEEDED FOR WHEEZE OR SHORTNESS OF BREATH 8.5 each 2   aspirin  EC 81 MG tablet Take 1 tablet (81 mg total) by mouth daily. Swallow whole. 30 tablet 12   bisacodyl  (DULCOLAX) 5 MG EC tablet Take 10 mg by mouth daily at 6 (six) AM.     calcium  carbonate (TUMS - DOSED IN MG ELEMENTAL CALCIUM ) 500 MG chewable tablet Chew 3-4 tablets by mouth daily.     Calcium  Carbonate Antacid (ALKA-SELTZER ANTACID PO) Take by mouth as needed.     carvedilol  (COREG ) 12.5 MG tablet Take 1 tablet (12.5 mg total) by mouth 2 (two) times daily with  a meal. 180 tablet 3   chlorthalidone  (HYGROTON ) 25 MG tablet Take 1 tablet (25 mg total) by mouth daily. 90 tablet 2   clopidogrel  (PLAVIX ) 75 MG tablet Take 1 tablet (75 mg total) by mouth daily with breakfast. 90 tablet 3   Evolocumab  (REPATHA  SURECLICK) 140 MG/ML SOAJ Inject 140 mg into the skin every 14 (fourteen) days. 6 mL 3   Fluticasone  Furoate (ARNUITY ELLIPTA ) 100 MCG/ACT AEPB Inhale 2 puffs into the lungs daily. 90 each 5   irbesartan  (AVAPRO ) 300 MG tablet Take 1 tablet (300 mg total) by mouth daily. 90 tablet 2   linaclotide  (LINZESS ) 72 MCG capsule Take 1 capsule (72 mcg total) by mouth daily  before breakfast. 90 capsule 3   Multiple Vitamins-Minerals (CENTRUM SILVER PO) Take 1 capsule by mouth daily.     nitroGLYCERIN  (NITROSTAT ) 0.4 MG SL tablet Place 1 tablet (0.4 mg total) under the tongue every 5 (five) minutes as needed for chest pain. 28 tablet 0   pantoprazole  (PROTONIX ) 20 MG tablet Take 1 tablet (20 mg total) by mouth daily. 90 tablet 1   potassium chloride  (KLOR-CON ) 10 MEQ tablet Take 1 tablet (10 mEq total) by mouth daily. 90 tablet 1   rosuvastatin  (CRESTOR ) 40 MG tablet Take 1 tablet (40 mg total) by mouth daily. 90 tablet 3   solifenacin  (VESICARE ) 10 MG tablet Take 1 tablet (10 mg total) by mouth daily. 90 tablet 0   spironolactone  (ALDACTONE ) 25 MG tablet Take 1 tablet (25 mg total) by mouth daily. 90 tablet 3   tirzepatide  (MOUNJARO ) 15 MG/0.5ML Pen Inject 15 mg into the skin once a week. 2 mL 5   Vitamin D , Ergocalciferol , (DRISDOL ) 1.25 MG (50000 UNIT) CAPS capsule Take by mouth.     acetaminophen  (TYLENOL ) 500 MG tablet Take 2 tablets (1,000 mg total) by mouth 3 (three) times daily as needed for mild pain, moderate pain, fever or headache. 30 tablet 0   Blood Glucose Monitoring Suppl DEVI 1 each by Does not apply route in the morning and at bedtime. May substitute to any manufacturer covered by patient's insurance. 1 each 0   glucose blood (ACCU-CHEK GUIDE) test strip Use as instructed 100 each 12   Lancets (ONETOUCH ULTRASOFT) lancets Use as instructed 100 each 2   [DISCONTINUED] DULoxetine  (CYMBALTA ) 30 MG capsule Take 1 capsule (30 mg) by mouth every morning for 3 days, then increase to 2 capsules (60 mg total) by mouth every morning. 60 capsule 2   No current facility-administered medications on file prior to visit.    ROS as in subjective   Objective: BP 108/68   Pulse 98   Temp 97.9 F (36.6 C)   Wt 230 lb (104.3 kg)   SpO2 98%   BMI 37.98 kg/m   General appearence: alert, no distress, WD/WN,  HEENT: normocephalic, sclerae anicteric,  nares  patent with some clear discharge,+ erythema, pharynx normal Oral cavity: somewhat dry MM, no lesions Neck: supple, no lymphadenopathy, no thyromegaly, no masses Heart: RRR, normal S1, S2, no murmurs Lungs: decreaed lower sounds, no wheezes,  + rhonchi, no rales Pulses: 2+ symmetric, upper and lower extremities, normal cap refill   Assessment: Encounter Diagnoses  Name Primary?   Acute cough Yes   Hemoptysis    Respiratory tract infection      Plan: Acute respiratory infection with hemoptysis Chest x-ray advised - Ordered chest x-ray. - flu and COVID swabs  negative. -prescribe Zpak antibiotic -prescribed Tessalon  Perles cough drops to use  up to 3 times daily as needed -consider Mucinex DM over the counter or Coricidin HBP for cough and congestion -Use your albuterol  inhaler 2 puffs every 4-6 hours as needed for shortness of breath or wheezing or tightness in the chest -If much worse or not improving over the next few days or feeling really weak, then go to the emergency department   Dehydration Signs of dehydration with dry mucous membranes and reduced intake. - Increase water  intake significantly the next few days - Given that you are somewhat dehydrated today, I recommend you do not take your chlorthalidone  Hygroton  on fluid pill for the next 24 hours, to give your body a chance to rehydrate -On Sunday if you still feel dehydrated or if your tongue and mouth is still dry, then just use either a half a tablet of chlorthalidone  or hold again another day to give your body a chance to hydrate   Ellory was seen today for acute visit.  Diagnoses and all orders for this visit:  Acute cough -     DG Chest 2 View; Future  Hemoptysis -     DG Chest 2 View; Future  Respiratory tract infection -     DG Chest 2 View; Future  Other orders -     Discontinue: azithromycin  (ZITHROMAX ) 250 MG tablet; Take 2 tablets on day 1, then 1 tablet daily on days 2 through 5 -     Discontinue:  benzonatate  (TESSALON ) 100 MG capsule; Take 1 capsule (100 mg total) by mouth 2 (two) times daily as needed for cough. -     Discontinue: benzonatate  (TESSALON ) 100 MG capsule; Take 1 capsule (100 mg total) by mouth 2 (two) times daily as needed for cough. -     Discontinue: azithromycin  (ZITHROMAX ) 250 MG tablet; Take 2 tablets on day 1, then 1 tablet daily on days 2 through 5 -     benzonatate  (TESSALON ) 100 MG capsule; Take 1 capsule (100 mg total) by mouth 2 (two) times daily as needed for cough. -     azithromycin  (ZITHROMAX ) 250 MG tablet; Take 2 tablets on day 1, then 1 tablet daily on days 2 through 5    F/u pending CXR     "

## 2024-05-04 NOTE — Patient Instructions (Addendum)
 Acute respiratory infection with hemoptysis Chest x-ray advised - Ordered chest x-ray. - flu and COVID swabs  negative. -prescribe Zpak antibiotic -prescribed Tessalon  Perles cough drops to use up to 3 times daily as needed -consider Mucinex DM over the counter or Coricidin HBP for cough and congestion -Use your albuterol  inhaler 2 puffs every 4-6 hours as needed for shortness of breath or wheezing or tightness in the chest -If much worse or not improving over the next few days or feeling really weak, then go to the emergency department   Dehydration Signs of dehydration with dry mucous membranes and reduced intake. - Increase water  intake significantly the next few days - Given that you are somewhat dehydrated today, I recommend you do not take your chlorthalidone  Hygroton  on fluid pill for the next 24 hours, to give your body a chance to rehydrate -On Sunday if you still feel dehydrated or if your tongue and mouth is still dry, then just use either a half a tablet of chlorthalidone  or hold again another day to give your body a chance to hydrate   Please go to [facility] for your chest xray.   Their hours are 8am - 4:30 pm Monday - Friday.  Take your insurance card with you.  [facility] [phone]   [address] [place], [place] [address]

## 2024-05-04 NOTE — Addendum Note (Signed)
 Addended by: BULAH ALM RAMAN on: 05/04/2024 02:01 PM   Modules accepted: Orders

## 2024-05-08 NOTE — Addendum Note (Signed)
 Addended by: VICCI HUSBAND A on: 05/08/2024 02:16 PM   Modules accepted: Orders

## 2024-05-18 ENCOUNTER — Other Ambulatory Visit: Payer: Self-pay

## 2024-05-18 ENCOUNTER — Other Ambulatory Visit (HOSPITAL_COMMUNITY): Payer: Self-pay

## 2024-05-18 ENCOUNTER — Other Ambulatory Visit: Payer: Self-pay | Admitting: Family Medicine

## 2024-05-18 DIAGNOSIS — N3281 Overactive bladder: Secondary | ICD-10-CM

## 2024-05-18 MED ORDER — SOLIFENACIN SUCCINATE 10 MG PO TABS
10.0000 mg | ORAL_TABLET | Freq: Every day | ORAL | 0 refills | Status: AC
  Filled 2024-05-18: qty 90, 90d supply, fill #0

## 2024-05-21 ENCOUNTER — Telehealth: Payer: Self-pay | Admitting: Internal Medicine

## 2024-05-21 ENCOUNTER — Ambulatory Visit (HOSPITAL_COMMUNITY)

## 2024-05-21 ENCOUNTER — Encounter (HOSPITAL_COMMUNITY): Payer: Self-pay

## 2024-05-21 NOTE — Telephone Encounter (Signed)
 Spoke with pt, she has had a bad URI for about 1 and 1/2 weeks but feels she is getting better even though she is coughing up green to yellow sputum. She has finished her z-pak. She reports with little exertion, she will get an uncomfortable, dull feeling in her chest. With rest, in about 30 sec, the discomfort will go away. She denies any SOB or pain with coughing, she reports it is only with exertion. She denies edema. She wants to be seen today, no appointments available. The patient will see Dr Ladona (DOD), tomorrow. ER precautions discussed.

## 2024-05-21 NOTE — Telephone Encounter (Signed)
" ° °  Pt c/o of Chest Pain: STAT if active CP, including tightness, pressure, jaw pain, radiating pain to shoulder/upper arm/back, CP unrelieved by Nitro. Symptoms reported of SOB, nausea, vomiting, sweating.  1. Are you having CP right now? no    2. Are you experiencing any other symptoms (ex. SOB, nausea, vomiting, sweating)? No, just getting over an upper respiratory infection.    3. Is your CP continuous or coming and going? Comes and goes, only happens when she walk a  little distance.    4. Have you taken Nitroglycerin ? no   5. How long have you been experiencing CP? For two -three days.  She said she has had an upper respiratory infection for a week or so.     6. If NO CP at time of call then end call with telling Pt to call back or call 911 if Chest pain returns prior to return call from triage team.  "

## 2024-05-22 ENCOUNTER — Ambulatory Visit: Attending: Cardiology | Admitting: Cardiology

## 2024-05-22 ENCOUNTER — Encounter: Payer: Self-pay | Admitting: Cardiology

## 2024-05-22 VITALS — BP 118/79 | HR 97 | Resp 16 | Ht 65.25 in | Wt 234.0 lb

## 2024-05-22 DIAGNOSIS — I10 Essential (primary) hypertension: Secondary | ICD-10-CM

## 2024-05-22 DIAGNOSIS — E66812 Obesity, class 2: Secondary | ICD-10-CM | POA: Diagnosis not present

## 2024-05-22 DIAGNOSIS — I25118 Atherosclerotic heart disease of native coronary artery with other forms of angina pectoris: Secondary | ICD-10-CM | POA: Diagnosis not present

## 2024-05-22 DIAGNOSIS — Z6838 Body mass index (BMI) 38.0-38.9, adult: Secondary | ICD-10-CM

## 2024-05-22 DIAGNOSIS — E785 Hyperlipidemia, unspecified: Secondary | ICD-10-CM | POA: Diagnosis not present

## 2024-05-22 MED ORDER — ROSUVASTATIN CALCIUM 20 MG PO TABS: 20.0000 mg | ORAL_TABLET | Freq: Every day | ORAL | Status: AC

## 2024-05-22 NOTE — Patient Instructions (Addendum)
 Medication Instructions:  DECREASE  rosuvastatin  (CRESTOR ) to 20 MG tablet         Take 1 tablet (20 mg total) by mouth daily   *If you need a refill on your cardiac medications before your next appointment, please call your pharmacy*  Follow-Up: At [facility], you and your health needs are our priority.  As part of our continuing mission to provide you with exceptional heart care, our providers are all part of one team.  This team includes your primary Cardiologist (physician) and Advanced Practice Providers or APPs (Physician Assistants and Nurse Practitioners) who all work together to provide you with the care you need, when you need it.  Your next appointment:   6 month(s)  Provider:   Gordy Bergamo, MD      We recommend signing up for the patient portal called MyChart.  Patients are able to view lab/test results, encounter notes, upcoming appointments, etc.  Non-urgent messages can be sent to your provider as well, go to [email or web address].

## 2024-05-22 NOTE — Progress Notes (Signed)
 " Cardiology Office Note:  .   Date:  05/22/2024  ID:  Sandra Lawrence, DOB [birth year], MRN [id number] PCP: Joyce Norleen BROCKS, MD  [facility] Providers Cardiologist:  Stanly DELENA Leavens, MD   History of Present Illness: .   Sandra Lawrence is a 79 y.o. African-American female patient with CAD SP PCI to mid Cx, hypertension, hypercholesterolemia, diabetes mellitus type 2, OSA intolerant to CPAP presents to establish care with me, previously has seen multiple physicians in our practice and nurse practitioner.    Fortunately she has done well and has not had any recent hospitalizations or ED visits since 10/16/2023 when she presented with palpitations and hypokalemia.  She made an appointment to see me due to an episode of chest tightness similar to her angina prior to cardiac catheterization and angioplasty that occurred a week ago with exertional activity and relieved with rest.  She has not had any recurrence.  She is trying her best to lose weight and has been extremely careful with her diet as well.    Discussed the use of AI scribe software for clinical note transcription with the patient, who gave verbal consent to proceed.  History of Present Illness Sandra Lawrence is a 79 year old female with coronary artery disease who presents with concerns of chest discomfort.  She describes a sensation of chest tiredness when walking from her car to her house that began last Friday. Each episode lasts 30 seconds to 1 minute and resolves with sitting. She has had several similar episodes and relates them to the symptoms she had before stent placement. She denies current chest tightness and has not used nitroglycerin  during these recent episodes. She denies associated acid reflux symptoms.  She has coronary artery disease with prior stent placement and has nitroglycerin  as needed. She is on Repatha  and Crestor  for cholesterol, with a recent decrease in Crestor  from 40 mg to 20 mg due to very low  cholesterol. She has been on Mounjaro  since last year with a 72-pound weight loss since March.  Her blood pressure regimen includes carvedilol  12.5 mg twice daily, chlorthalidone  25 mg daily, spironolactone  25 mg daily, irbesartan  300 mg daily, and a daily potassium supplement.  She is a former smoker. She has chronic back pain from arthritis and degenerative changes that limit walking, so she focuses on bed exercises after hip and knee replacements.  Cardiac Studies relevent.    CARDIAC CATHETERIZATION 07/18/2023:  SYNERGY XD [id number]    ECHOCARDIOGRAM COMPLETE 07/16/2023 1. Left ventricular ejection fraction, by estimation, is 60 to 65%. Left ventricular ejection fraction by 3D volume is 60 %. The left ventricle has normal function. The left ventricle has no regional wall motion abnormalities. There is mild left ventricular hypertrophy. Left ventricular diastolic parameters are indeterminate. 2. Right ventricular systolic function is normal. The right ventricular size is normal. There is normal pulmonary artery systolic pressure. The estimated right ventricular systolic pressure is 30.9 mmHg. 3. Left atrial size was mildly dilated. 4. The mitral valve is normal in structure. Trivial mitral valve regurgitation. No evidence of mitral stenosis. 5. The aortic valve is tricuspid. Aortic valve regurgitation is not visualized. No aortic stenosis is present. 6. The inferior vena cava is normal in size with greater than 50% respiratory variability, suggesting right atrial pressure of 3 mmHg.   EKG:       Labs   Lab Results  Component Value Date   CHOL 56 (L) 11/21/2023   HDL 33 (L)  11/21/2023   LDLCALC 7 11/21/2023   TRIG 66 11/21/2023   CHOLHDL 1.7 11/21/2023   Lipoprotein (a)  Date/Time Value Ref Range Status  07/16/2023 04:18 AM 83.3 (H) <75.0 nmol/L Final    Comment:    (NOTE) Note:  Values greater than or equal to 75.0 nmol/L may       indicate an independent risk factor for CHD,        but must be evaluated with caution when applied       to non-Caucasian populations due to the       influence of genetic factors on Lp(a) across       ethnicities. Performed At: [facility] [address] [place], [place] [id number] Jennette Shorter MD Ey:[id number]     Recent Labs    07/18/23 1128 07/19/23 0625 10/16/23 0911 11/03/23 1038 02/23/24 1014  NA 133* 133* 136 139 139  K 3.5 4.0 3.3* 3.9 4.5  CL 101 103 103 102 103  CO2 24 23 26 22  18*  GLUCOSE 151* 216* 94 88 94  BUN 14 21 23 20 10   CREATININE 0.61 0.77 1.07* 1.07* 0.75  CALCIUM  8.5* 8.6* 9.3 9.9 9.9  GFRNONAA >60 >60 54*  --   --     Lab Results  Component Value Date   ALT 19 11/21/2023   AST 17 11/21/2023   ALKPHOS 95 11/21/2023   BILITOT 0.4 11/21/2023      Latest Ref Rng & Units 11/03/2023   10:38 AM 10/16/2023    9:11 AM 07/19/2023    6:25 AM  CBC  WBC 3.4 - 10.8 x10E3/uL 5.2  4.5  7.8   Hemoglobin 11.1 - 15.9 g/dL 88.2  87.7  87.2   Hematocrit 34.0 - 46.6 % 37.1  36.5  39.7   Platelets 150 - 450 x10E3/uL 247  201  241    Lab Results  Component Value Date   HGBA1C 5.6 03/22/2024    Lab Results  Component Value Date   TSH 1.510 11/17/2022     ROS  Review of Systems  Cardiovascular:  Positive for chest pain (one episode). Negative for dyspnea on exertion and leg swelling.   Physical Exam:   VS:  BP 118/79 (BP Location: Left Arm, Patient Position: Sitting, Cuff Size: Large)   Pulse 97   Ht 5' 5.25 (1.657 m)   Wt 234 lb (106.1 kg)   SpO2 98%   BMI 38.64 kg/m    Wt Readings from Last 3 Encounters:  05/22/24 234 lb (106.1 kg)  05/04/24 230 lb (104.3 kg)  04/09/24 239 lb 2 oz (108.5 kg)    BP Readings from Last 3 Encounters:  05/22/24 118/79  05/04/24 108/68  04/09/24 112/68   Physical Exam Neck:     Vascular: No carotid bruit or JVD.  Cardiovascular:     Rate and Rhythm: Normal rate and regular rhythm.     Pulses: Intact distal pulses.     Heart sounds: Normal heart  sounds. No murmur heard.    No gallop.  Pulmonary:     Effort: Pulmonary effort is normal.     Breath sounds: Normal breath sounds.  Abdominal:     General: Bowel sounds are normal.     Palpations: Abdomen is soft.  Musculoskeletal:     Right lower leg: No edema.     Left lower leg: No edema.    ASSESSMENT AND PLAN: .      ICD-10-CM   1. Coronary artery  disease of native artery of native heart with stable angina pectoris  I25.118 rosuvastatin  (CRESTOR ) 20 MG tablet    2. Hyperlipidemia with target low density lipoprotein (LDL) cholesterol less than 55 mg/dL  Z21.4 rosuvastatin  (CRESTOR ) 20 MG tablet    3. Primary hypertension  I10     4. Class 2 severe obesity due to excess calories with serious comorbidity and body mass index (BMI) of 38.0 to 38.9 in adult  E66.812    Z68.38      Assessment & Plan Coronary artery disease with stable angina Intermittent chest discomfort when walking, resolving within 30 seconds to a minute. Symptoms ceased since last Friday. Differential includes angina due to coronary artery disease. No evidence of acid reflux. Symptoms may be exacerbated by cold weather causing arterial spasm. Occasional angina can occur despite stent placement and medication. - Use nitroglycerin  as needed for chest discomfort. - Report if chest discomfort becomes frequent or occurs with minimal exertion. - Consider using a stationary bicycle or elliptical for exercise to monitor for chest discomfort.  Hyperlipidemia with target LDL cholesterol less than 55 mg/dL Cholesterol levels are well controlled with total cholesterol at 56 mg/dL and LDL at 7 mg/dL. Current regimen includes Crestor  and Repatha . Decision to reduce Crestor  dosage due to effective LDL control. - Reduced Crestor  dosage from 40 mg to 20 mg daily.  Primary hypertension Blood pressure is well controlled at 118/79 mmHg. Current regimen includes carvedilol , chlorthalidone , spironolactone , and irbesartan . Weight  loss may necessitate future adjustments in antihypertensive therapy. - Continue current antihypertensive regimen.  Class 2 severe obesity (BMI 38.0) Significant weight loss of 72 pounds since March, reducing BMI from over 42 to 38. Weight loss attributed to Mounjaro  and lifestyle changes. Continued weight loss may further improve cardiovascular health and reduce medication needs. - Continue current weight loss efforts. - Consider using a stationary bicycle or elliptical for exercise. - Monitor weight and adjust medications as needed.  Follow up: 6 months. CAD, Angina, Hyperlipidemia, Hypertension   Signed,  Gordy Bergamo, MD, [facility] 05/22/2024, 2:19 PM [facility] [address] [place], [place] [address] Phone: [phone]. Fax:  [phone]  "

## 2024-06-04 ENCOUNTER — Other Ambulatory Visit (HOSPITAL_COMMUNITY): Payer: Self-pay

## 2024-06-07 ENCOUNTER — Other Ambulatory Visit (HOSPITAL_COMMUNITY): Payer: Self-pay

## 2024-06-07 ENCOUNTER — Other Ambulatory Visit: Payer: Self-pay | Admitting: Cardiology

## 2024-06-08 ENCOUNTER — Other Ambulatory Visit: Payer: Self-pay

## 2024-06-08 ENCOUNTER — Other Ambulatory Visit (HOSPITAL_COMMUNITY): Payer: Self-pay

## 2024-06-08 MED ORDER — NITROGLYCERIN 0.4 MG SL SUBL
0.4000 mg | SUBLINGUAL_TABLET | SUBLINGUAL | 0 refills | Status: DC | PRN
  Filled 2024-06-08: qty 25, 7d supply, fill #0

## 2024-06-14 ENCOUNTER — Telehealth: Payer: Self-pay | Admitting: Cardiology

## 2024-06-14 NOTE — Telephone Encounter (Signed)
" °*  STAT* If patient is at the pharmacy, call can be transferred to refill team.   1. Which medications need to be refilled? (please list name of each medication and dose if known)  nitroGLYCERIN  (NITROSTAT ) 0.4 MG SL tablet  2. Which pharmacy/location (including street and city if local pharmacy) is medication to be sent to?  [facility] [place] [facility]   3. Do they need a 30 day or 90 day supply? 90 day  "

## 2024-06-15 ENCOUNTER — Other Ambulatory Visit (HOSPITAL_COMMUNITY): Payer: Self-pay

## 2024-06-18 ENCOUNTER — Other Ambulatory Visit (HOSPITAL_COMMUNITY): Payer: Self-pay

## 2024-06-18 MED ORDER — NITROGLYCERIN 0.4 MG SL SUBL
0.4000 mg | SUBLINGUAL_TABLET | SUBLINGUAL | 5 refills | Status: AC | PRN
  Filled 2024-06-18: qty 25, 8d supply, fill #0

## 2024-06-18 NOTE — Telephone Encounter (Signed)
 Refill sent

## 2024-07-17 ENCOUNTER — Ambulatory Visit: Admitting: Internal Medicine

## 2024-07-24 ENCOUNTER — Ambulatory Visit: Admitting: Family Medicine
